# Patient Record
Sex: Male | Born: 1965 | Race: White | Hispanic: No | Marital: Married | Smoking: Never smoker
Health system: Southern US, Community
[De-identification: ages and names within clinical notes are randomized; demographics above are authoritative.]

## PROBLEM LIST (undated history)

## (undated) DIAGNOSIS — Z973 Presence of spectacles and contact lenses: Secondary | ICD-10-CM

## (undated) DIAGNOSIS — F329 Major depressive disorder, single episode, unspecified: Secondary | ICD-10-CM

## (undated) DIAGNOSIS — F17220 Nicotine dependence, chewing tobacco, uncomplicated: Secondary | ICD-10-CM

## (undated) DIAGNOSIS — E291 Testicular hypofunction: Secondary | ICD-10-CM

## (undated) DIAGNOSIS — I10 Essential (primary) hypertension: Secondary | ICD-10-CM

## (undated) DIAGNOSIS — G4730 Sleep apnea, unspecified: Secondary | ICD-10-CM

## (undated) DIAGNOSIS — I4819 Other persistent atrial fibrillation: Secondary | ICD-10-CM

## (undated) DIAGNOSIS — I5189 Other ill-defined heart diseases: Secondary | ICD-10-CM

## (undated) DIAGNOSIS — E782 Mixed hyperlipidemia: Secondary | ICD-10-CM

## (undated) DIAGNOSIS — M1990 Unspecified osteoarthritis, unspecified site: Secondary | ICD-10-CM

## (undated) DIAGNOSIS — K219 Gastro-esophageal reflux disease without esophagitis: Secondary | ICD-10-CM

## (undated) DIAGNOSIS — I2510 Atherosclerotic heart disease of native coronary artery without angina pectoris: Secondary | ICD-10-CM

## (undated) DIAGNOSIS — G8929 Other chronic pain: Principal | ICD-10-CM

## (undated) HISTORY — DX: Essential (primary) hypertension: I10

## (undated) HISTORY — DX: Mixed hyperlipidemia: E78.2

## (undated) HISTORY — DX: Nicotine dependence, chewing tobacco, uncomplicated: F17.220

## (undated) HISTORY — PX: WISDOM TOOTH EXTRACTION: SHX21

## (undated) HISTORY — DX: Other persistent atrial fibrillation: I48.19

## (undated) HISTORY — PX: TONSILLECTOMY: SUR1361

## (undated) HISTORY — DX: Atherosclerotic heart disease of native coronary artery without angina pectoris: I25.10

## (undated) HISTORY — DX: Unspecified osteoarthritis, unspecified site: M19.90

## (undated) HISTORY — DX: Other ill-defined heart diseases: I51.89

## (undated) HISTORY — DX: Other chronic pain: G89.29

## (undated) HISTORY — DX: Major depressive disorder, single episode, unspecified: F32.9

## (undated) HISTORY — DX: Testicular hypofunction: E29.1

## (undated) HISTORY — DX: Morbid (severe) obesity due to excess calories: E66.01

---

## 2002-06-09 ENCOUNTER — Emergency Department (HOSPITAL_COMMUNITY): Admission: EM | Admit: 2002-06-09 | Discharge: 2002-06-10 | Payer: Self-pay | Admitting: Emergency Medicine

## 2003-04-11 ENCOUNTER — Ambulatory Visit (HOSPITAL_BASED_OUTPATIENT_CLINIC_OR_DEPARTMENT_OTHER): Admission: RE | Admit: 2003-04-11 | Discharge: 2003-04-11 | Payer: Self-pay | Admitting: Family Medicine

## 2004-01-20 ENCOUNTER — Ambulatory Visit: Payer: Self-pay | Admitting: Family Medicine

## 2004-01-22 ENCOUNTER — Ambulatory Visit: Payer: Self-pay | Admitting: Family Medicine

## 2004-02-19 ENCOUNTER — Ambulatory Visit: Payer: Self-pay | Admitting: Family Medicine

## 2004-02-26 ENCOUNTER — Ambulatory Visit: Payer: Self-pay | Admitting: Family Medicine

## 2005-05-03 ENCOUNTER — Ambulatory Visit: Payer: Self-pay | Admitting: Family Medicine

## 2005-06-02 ENCOUNTER — Ambulatory Visit: Payer: Self-pay | Admitting: Family Medicine

## 2005-08-02 ENCOUNTER — Ambulatory Visit: Payer: Self-pay | Admitting: Family Medicine

## 2006-02-01 ENCOUNTER — Ambulatory Visit: Payer: Self-pay | Admitting: Family Medicine

## 2006-02-28 ENCOUNTER — Ambulatory Visit: Payer: Self-pay | Admitting: Family Medicine

## 2006-03-08 ENCOUNTER — Ambulatory Visit: Payer: Self-pay | Admitting: Family Medicine

## 2006-03-20 ENCOUNTER — Ambulatory Visit: Payer: Self-pay | Admitting: Unknown Physician Specialty

## 2006-03-21 ENCOUNTER — Ambulatory Visit: Payer: Self-pay | Admitting: Cardiology

## 2006-03-21 ENCOUNTER — Ambulatory Visit: Payer: Self-pay

## 2006-04-04 ENCOUNTER — Ambulatory Visit: Payer: Self-pay | Admitting: Family Medicine

## 2006-09-22 ENCOUNTER — Ambulatory Visit: Payer: Self-pay | Admitting: Family Medicine

## 2006-09-22 DIAGNOSIS — E8881 Metabolic syndrome: Secondary | ICD-10-CM | POA: Insufficient documentation

## 2006-09-22 DIAGNOSIS — I10 Essential (primary) hypertension: Secondary | ICD-10-CM | POA: Insufficient documentation

## 2006-09-22 DIAGNOSIS — E1159 Type 2 diabetes mellitus with other circulatory complications: Secondary | ICD-10-CM | POA: Insufficient documentation

## 2006-09-22 DIAGNOSIS — E782 Mixed hyperlipidemia: Secondary | ICD-10-CM | POA: Insufficient documentation

## 2006-09-22 DIAGNOSIS — M069 Rheumatoid arthritis, unspecified: Secondary | ICD-10-CM | POA: Insufficient documentation

## 2006-09-22 HISTORY — DX: Morbid (severe) obesity due to excess calories: E66.01

## 2006-09-22 HISTORY — DX: Essential (primary) hypertension: I10

## 2006-09-23 ENCOUNTER — Ambulatory Visit: Payer: Self-pay | Admitting: Internal Medicine

## 2006-09-27 ENCOUNTER — Encounter (INDEPENDENT_AMBULATORY_CARE_PROVIDER_SITE_OTHER): Payer: Self-pay | Admitting: Internal Medicine

## 2006-09-28 LAB — CONVERTED CEMR LAB
BUN: 11 mg/dL (ref 6–23)
CO2: 28 meq/L (ref 19–32)
Calcium: 9.1 mg/dL (ref 8.4–10.5)
Chloride: 103 meq/L (ref 96–112)
Cholesterol: 207 mg/dL (ref 0–200)
Creatinine, Ser: 1 mg/dL (ref 0.4–1.5)
Direct LDL: 138.7 mg/dL
GFR calc Af Amer: 105 mL/min
GFR calc non Af Amer: 87 mL/min
Glucose, Bld: 93 mg/dL (ref 70–99)
HDL: 29.5 mg/dL — ABNORMAL LOW (ref >39.0)
Potassium: 3.8 meq/L (ref 3.5–5.1)
Sodium: 138 meq/L (ref 135–145)
Total CHOL/HDL Ratio: 7
Triglycerides: 130 mg/dL (ref 0–149)
VLDL: 26 mg/dL (ref 0–40)

## 2006-11-29 DIAGNOSIS — G4730 Sleep apnea, unspecified: Secondary | ICD-10-CM | POA: Insufficient documentation

## 2006-11-29 DIAGNOSIS — G4733 Obstructive sleep apnea (adult) (pediatric): Secondary | ICD-10-CM | POA: Insufficient documentation

## 2006-12-05 ENCOUNTER — Ambulatory Visit: Payer: Self-pay | Admitting: Family Medicine

## 2006-12-06 LAB — CONVERTED CEMR LAB
ALT: 38 units/L (ref 0–53)
AST: 29 units/L (ref 0–37)
Cholesterol: 138 mg/dL (ref 0–200)
HDL: 32.7 mg/dL — ABNORMAL LOW (ref >39.0)
LDL Cholesterol: 74 mg/dL (ref 0–99)
Total CHOL/HDL Ratio: 4.2
Triglycerides: 157 mg/dL — ABNORMAL HIGH (ref 0–149)
VLDL: 31 mg/dL (ref 0–40)

## 2007-01-06 ENCOUNTER — Ambulatory Visit: Payer: Self-pay | Admitting: Family Medicine

## 2007-02-28 ENCOUNTER — Ambulatory Visit: Payer: Self-pay | Admitting: Family Medicine

## 2007-02-28 DIAGNOSIS — M719 Bursopathy, unspecified: Secondary | ICD-10-CM

## 2007-02-28 DIAGNOSIS — M67919 Unspecified disorder of synovium and tendon, unspecified shoulder: Secondary | ICD-10-CM | POA: Insufficient documentation

## 2007-03-16 ENCOUNTER — Ambulatory Visit: Payer: Self-pay | Admitting: Family Medicine

## 2007-03-16 DIAGNOSIS — F3289 Other specified depressive episodes: Secondary | ICD-10-CM

## 2007-03-16 DIAGNOSIS — F329 Major depressive disorder, single episode, unspecified: Secondary | ICD-10-CM

## 2007-03-16 DIAGNOSIS — F324 Major depressive disorder, single episode, in partial remission: Secondary | ICD-10-CM | POA: Insufficient documentation

## 2007-03-16 HISTORY — DX: Other specified depressive episodes: F32.89

## 2007-03-16 HISTORY — DX: Major depressive disorder, single episode, unspecified: F32.9

## 2007-03-20 ENCOUNTER — Telehealth: Payer: Self-pay | Admitting: Family Medicine

## 2007-03-20 ENCOUNTER — Ambulatory Visit: Payer: Self-pay | Admitting: Family Medicine

## 2007-03-20 DIAGNOSIS — J069 Acute upper respiratory infection, unspecified: Secondary | ICD-10-CM | POA: Insufficient documentation

## 2007-03-23 ENCOUNTER — Encounter: Payer: Self-pay | Admitting: Family Medicine

## 2007-04-25 ENCOUNTER — Ambulatory Visit: Payer: Self-pay | Admitting: Family Medicine

## 2007-04-25 DIAGNOSIS — J310 Chronic rhinitis: Secondary | ICD-10-CM | POA: Insufficient documentation

## 2007-04-25 DIAGNOSIS — M7020 Olecranon bursitis, unspecified elbow: Secondary | ICD-10-CM | POA: Insufficient documentation

## 2007-04-25 DIAGNOSIS — F528 Other sexual dysfunction not due to a substance or known physiological condition: Secondary | ICD-10-CM | POA: Insufficient documentation

## 2007-06-08 ENCOUNTER — Encounter (INDEPENDENT_AMBULATORY_CARE_PROVIDER_SITE_OTHER): Payer: Self-pay | Admitting: *Deleted

## 2007-07-27 ENCOUNTER — Telehealth (INDEPENDENT_AMBULATORY_CARE_PROVIDER_SITE_OTHER): Payer: Self-pay | Admitting: Internal Medicine

## 2007-08-22 ENCOUNTER — Ambulatory Visit: Payer: Self-pay | Admitting: Family Medicine

## 2007-08-22 DIAGNOSIS — N63 Unspecified lump in unspecified breast: Secondary | ICD-10-CM | POA: Insufficient documentation

## 2007-08-22 DIAGNOSIS — K644 Residual hemorrhoidal skin tags: Secondary | ICD-10-CM | POA: Insufficient documentation

## 2007-08-28 ENCOUNTER — Encounter: Admission: RE | Admit: 2007-08-28 | Discharge: 2007-08-28 | Payer: Self-pay | Admitting: Family Medicine

## 2007-08-28 ENCOUNTER — Telehealth (INDEPENDENT_AMBULATORY_CARE_PROVIDER_SITE_OTHER): Payer: Self-pay | Admitting: Internal Medicine

## 2007-12-13 ENCOUNTER — Ambulatory Visit: Payer: Self-pay | Admitting: Family Medicine

## 2008-01-12 ENCOUNTER — Encounter (INDEPENDENT_AMBULATORY_CARE_PROVIDER_SITE_OTHER): Payer: Self-pay | Admitting: Internal Medicine

## 2008-01-12 ENCOUNTER — Ambulatory Visit: Payer: Self-pay | Admitting: Family Medicine

## 2008-01-12 DIAGNOSIS — R0602 Shortness of breath: Secondary | ICD-10-CM | POA: Insufficient documentation

## 2008-01-12 DIAGNOSIS — R079 Chest pain, unspecified: Secondary | ICD-10-CM | POA: Insufficient documentation

## 2008-01-24 ENCOUNTER — Ambulatory Visit: Payer: Self-pay | Admitting: Pulmonary Disease

## 2008-02-06 ENCOUNTER — Ambulatory Visit: Payer: Self-pay | Admitting: Internal Medicine

## 2008-02-07 ENCOUNTER — Ambulatory Visit: Payer: Self-pay | Admitting: Pulmonary Disease

## 2008-02-14 ENCOUNTER — Telehealth (INDEPENDENT_AMBULATORY_CARE_PROVIDER_SITE_OTHER): Payer: Self-pay | Admitting: Internal Medicine

## 2009-04-10 ENCOUNTER — Encounter: Admission: RE | Admit: 2009-04-10 | Discharge: 2009-04-10 | Payer: Self-pay | Admitting: Internal Medicine

## 2009-04-28 ENCOUNTER — Ambulatory Visit: Payer: Self-pay | Admitting: Gastroenterology

## 2009-05-15 ENCOUNTER — Ambulatory Visit: Payer: Self-pay | Admitting: Gastroenterology

## 2009-10-06 ENCOUNTER — Encounter (INDEPENDENT_AMBULATORY_CARE_PROVIDER_SITE_OTHER): Payer: Self-pay | Admitting: *Deleted

## 2010-03-27 IMAGING — CT CT ANGIO CHEST
1 of 2 series · 19 of 32 positions shown · IV contrast (Omnipaque 300)
Comparison: None

CLINICAL DATA: Shortness of breath and chest pain

CT ANGIOGRAPHY CHEST
TECHNIQUE: Multidetector CT imaging of the chest using the
standard protocol during bolus administration of intravenous
contrast. Multiplanar reconstructed images including MIPs were
obtained and reviewed to evaluate the vascular anatomy.
Contrast: 80 ml [id number]

[Series 5: pe 1.0 b20f st · axial · 0.78mm/px · z∈[-362,-101]mm · 19 of 287 slices shown]
[im 13/287  lung]
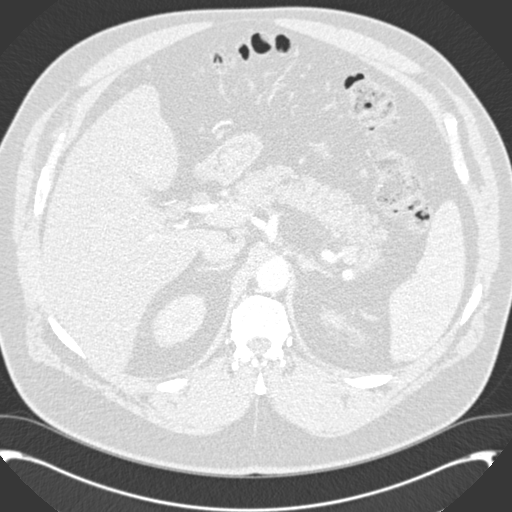
[im 25/287  soft-tissue]
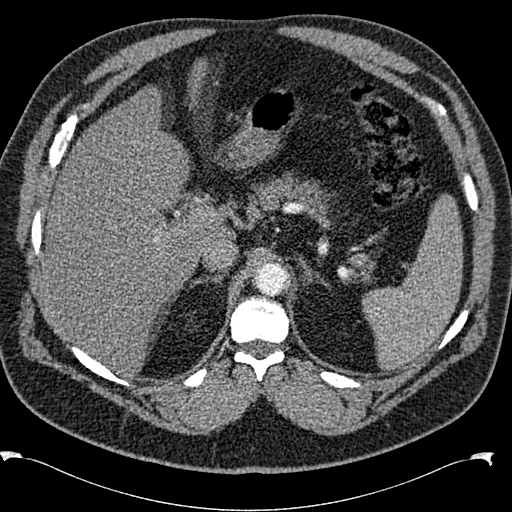
[im 38/287  lung]
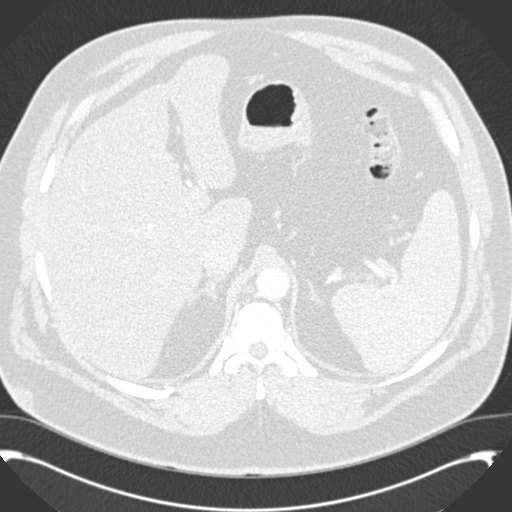
[im 63/287  soft-tissue]
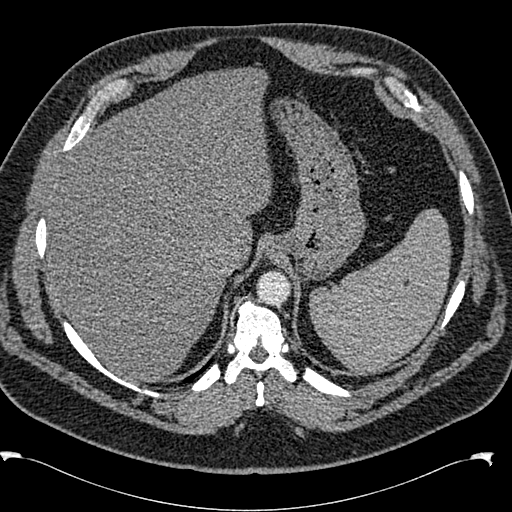
[im 75/287  lung]
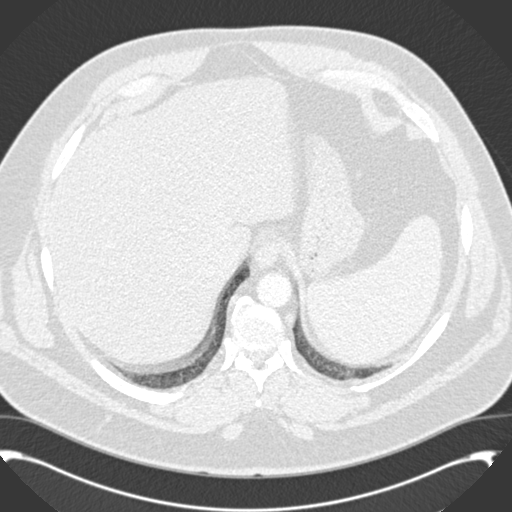
[im 88/287  soft-tissue]
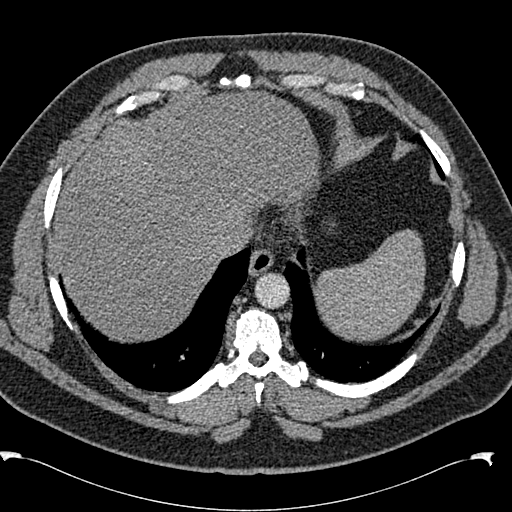
[im 100/287  lung]
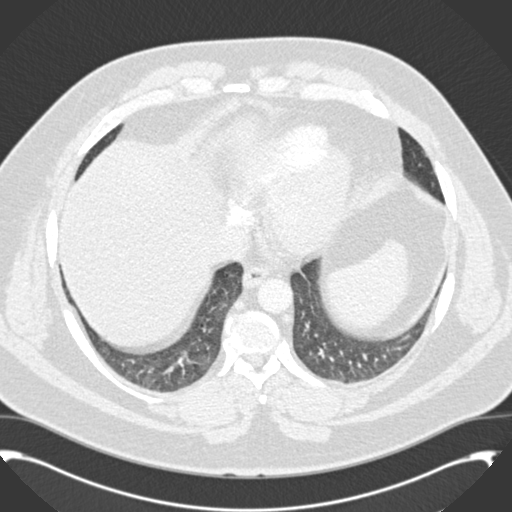
[im 112/287  soft-tissue]
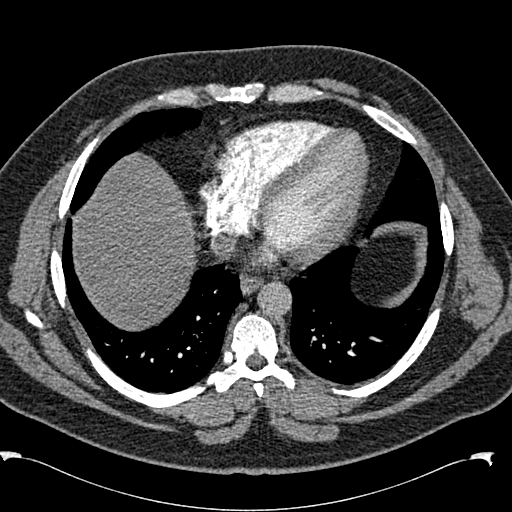
[im 125/287  lung]
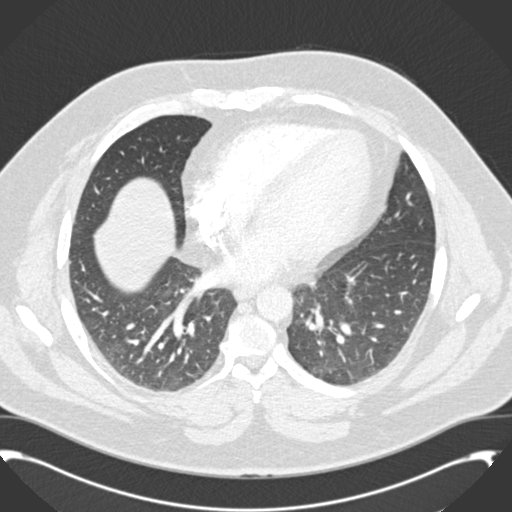
[im 150/287  soft-tissue]
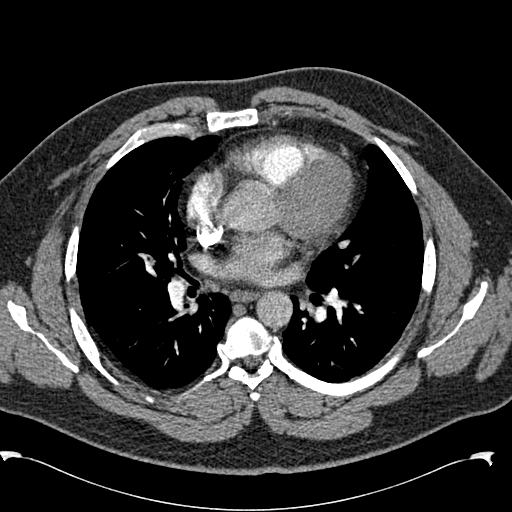
[im 162/287  lung]
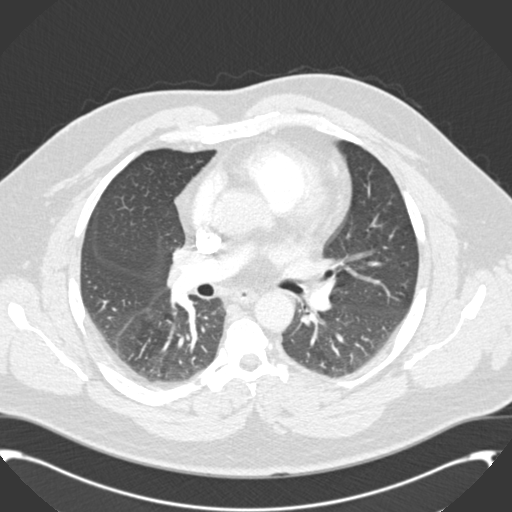
[im 175/287  soft-tissue]
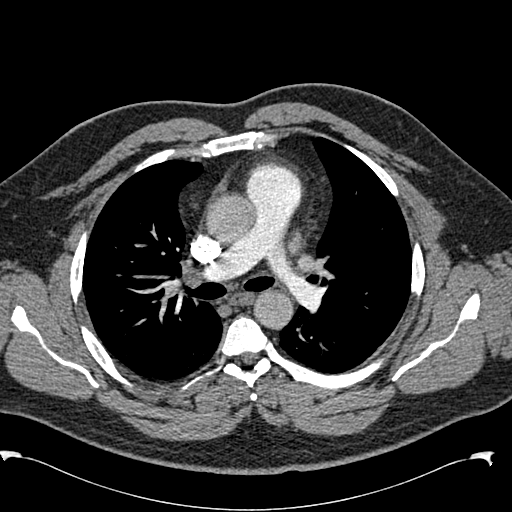
[im 187/287  lung]
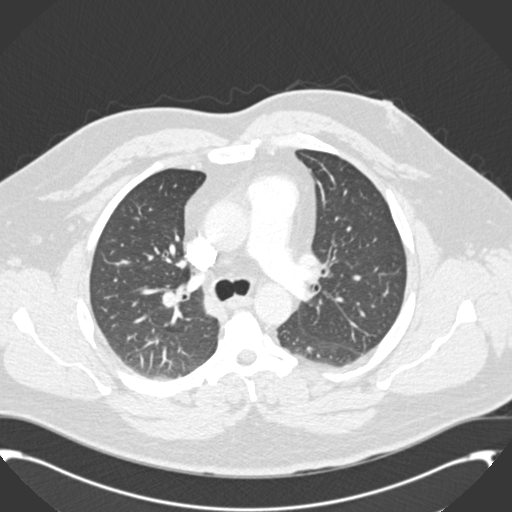
[im 199/287  soft-tissue]
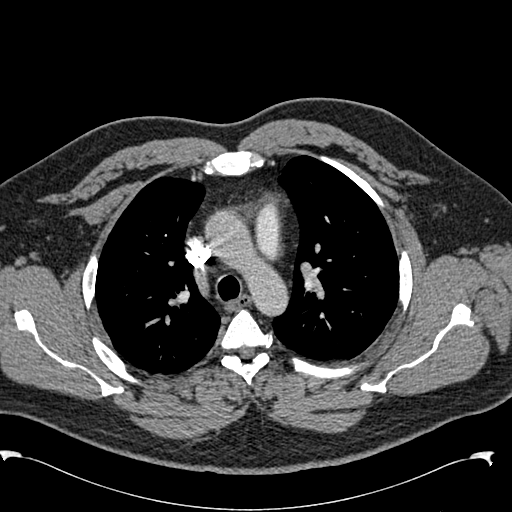
[im 212/287  lung]
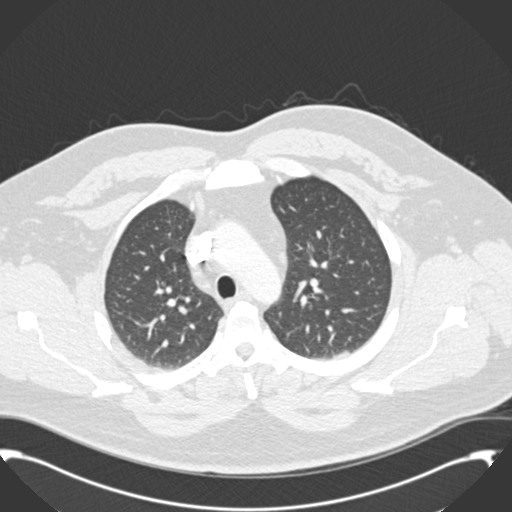
[im 224/287  soft-tissue]
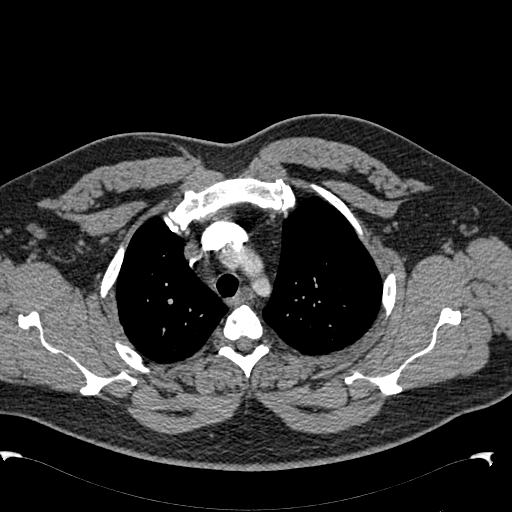
[im 249/287  lung]
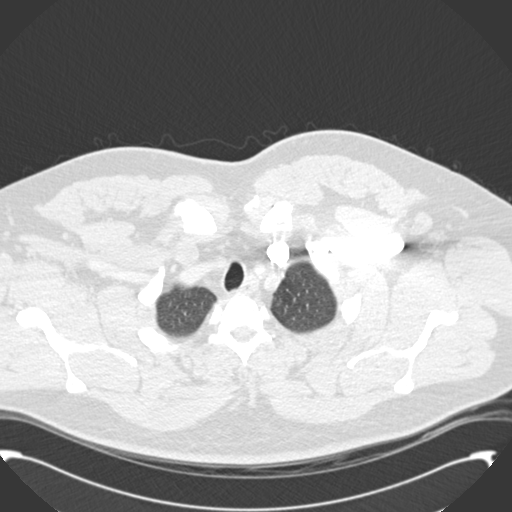
[im 262/287  soft-tissue]
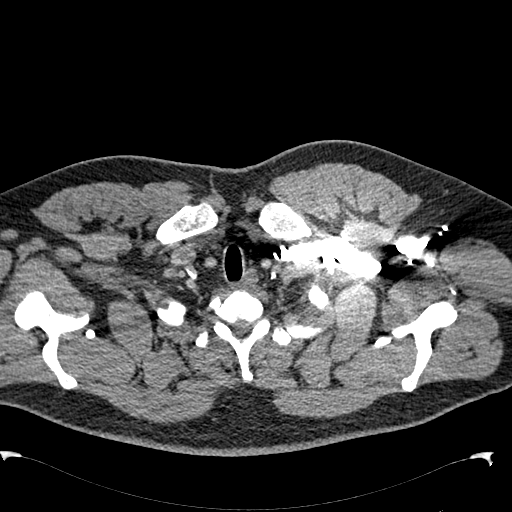
[im 274/287  lung]
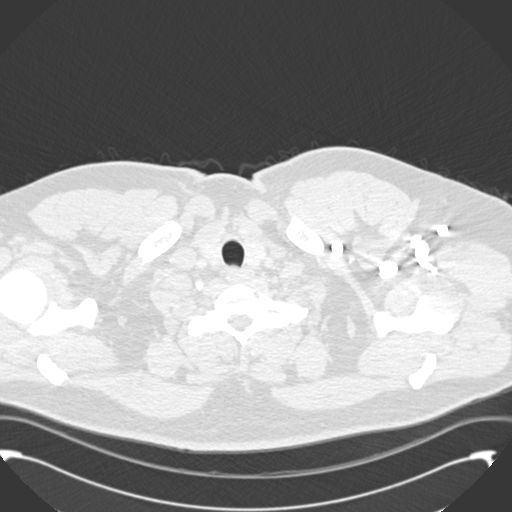

[19 of 32 positions shown; findings below may reference images not displayed]

FINDINGS: The chest wall is unremarkable.  No supraclavicular or
axillary adenopathy.  The bony thorax is intact.  Mild degenerative
changes are noted in the thoracic spine.

The heart is within normal limits in size.  No pericardial
effusion.  The thoracic aorta is normal in caliber.  No dissection.

The pulmonary arterial tree is not well opacified but no definite
filling defects are seen to suggest pulmonary emboli.  The upper
lobe vessels are not well evaluated and there is breathing motion
artifact particularly in the lower lung zones which decreases
sensitivity for small pulmonary emboli.

The esophagus is grossly normal.  No mediastinal or hilar
adenopathy.

Examination of the lung parenchyma demonstrates breathing motion
artifact.  No definite masses or nodules.  No acute pulmonary
findings.  No pleural effusions.  Mild dependent edema/atelectasis.
The upper abdomen is unremarkable.
IMPRESSION: 1.  Limited examination due to a suboptimal opacification of the
pulmonary arteries and breathing motion artifact.  However, no
large central pulmonary emboli are seen.
2.  Normal CT appearance of the thoracic aorta.
3.  No mediastinal or hilar adenopathy.
4.  No acute pulmonary findings, mass lesions or pulmonary nodules.

## 2010-03-31 NOTE — Assessment & Plan Note (Signed)
Summary: rov for dypnea   Referred by:  Everrett Coombe PCP:  Everrett Coombe  Chief Complaint:  Pt returns for followup on PFT's and ct chest.  He denies any complaints today.Marland Kitchen  History of Present Illness: the pt comes in today for discussion of his recent ct chest and pfts for w/u of his doe and atypical chest/shoulder pain.  His ct chest revealed no mediastinal, parenchymal, or chest wall abnormality.  There was also no PE noted.  His pfts were essentially normal.  I have gone over the studies in great detail with the patient and his wife, and answered all of their questions.     Current Allergies: PENICILLIN G POTASSIUM (PENICILLIN G POTASSIUM)      Vital Signs:  Patient Profile:   45 Years Old Male Weight:      314 pounds O2 Sat:      91 % O2 treatment:    Room Air Temp:     98.1 degrees F oral Pulse rate:   102 / minute BP sitting:   130 / 78  (left arm)  Vitals Entered By: Vernie Murders (February 07, 2008 4:00 PM)                 Physical Exam  General:     obese male in nad      Impression & Recommendations:  Problem # 1:  SHORTNESS OF BREATH (ICD-786.05) no obvious pulmonary etiology found.  Suspect is secondary to obesity and deconditioning, but would also consider occult coronary artery disease or diastolic dysfunction.  Problem # 2:  CHEST PAIN (ICD-786.50) suspect is msk in origin.  No pulmonary or anatomic etiology seen on ct chest.  May need ortho consult since involves shoulder girdle.   Patient Instructions: 1)  work on weight loss and some type of conditioning program 2)  would be happy to see again if symptoms change or worsen.   ]

## 2010-03-31 NOTE — Progress Notes (Signed)
Summary: Help for hemorrhoid!  Phone Note Call from Patient Call back at [phone]    Caller: Spouse-Kathy Summary of Call: Pt seen a week ago and was given suppositories for hemorrhoids  . Wife states no matter how hard they try they cannot get them to stay in. She needs advice on what to do. Also because she cannot get them to work they went out and bought Prep H. On the box it stated not to take if you take RX for BP or Depression. He takes both. Please Advise!  Initial call taken by: Mickle Asper,  August 28, 2007 3:23 PM  Follow-up for Phone Call        needs to lie on bed and with gloved finger push gently past sphincter, lie on bed without pushing 57min--will melt and felling of needeing to go will go away.  Billie-Lynn Tyler Deis FNP  August 28, 2007 4:52 PM   Additional Follow-up for Phone Call Additional follow up Details #1::        PC TO PT'S SPOUSE, KATHY, GIVEN INSTRUCTIONS ON HOW TO INSERT SUPPOSITORIES. WILL LET us KNOW IF THIS HELPS OR NOT. Additional Follow-up by: Cooper Render,  August 28, 2007 4:57 PM

## 2010-03-31 NOTE — Letter (Signed)
Summary: Nadara Eaton letter  [place] at [facility]  [address] [place], [place] [address]   Phone: [phone]  Fax: [phone]       10/06/2009 MRN: [id number]  NIKOLA MARONE [address] [place], [place]  [address]  Dear Mr. [facility] [place] and [facility] announce the retirement of Arta Silence, M.D., from full-time practice at the [facility] office effective August 28, 2009 and his plans of returning part-time.  It is important to Dr. Hetty Ely and to our practice that you understand that [facility] has seven physicians in our office for your health care needs.  We will continue to offer the same exceptional care that you have today.    Dr. Hetty Ely has spoken to many of you about his plans for retirement and returning part-time in the fall.   We will continue to work with you through the transition to schedule appointments for you in the office and meet the high standards that [facility] is committed to.   Again, it is with great pleasure that we share the news that Dr. Hetty Ely will return to [facility] in October of 2011 with a reduced schedule.    If you have any questions, or would like to request an appointment with one of our physicians, please call us at [phone] and press the option for Scheduling an appointment.  We take pleasure in providing you with excellent patient care and look forward to seeing you at your next office visit.  Our [facility] Physicians are:  Tillman Abide, M.D. Laurita Quint, M.D. Roxy Manns, M.D. Kerby Nora, M.D. Hannah Beat, M.D. Ruthe Mannan, M.D. We proudly welcomed Raechel Ache, M.D. and Eustaquio Boyden, M.D. to the practice in July/August 2011.  Sincerely,  [facility]

## 2010-03-31 NOTE — Progress Notes (Signed)
Summary: Rx Flonase  Phone Note Call from Patient Call back at [phone]   Caller: Spouse Call For: Anthony Coombe, FNP Summary of Call: Calling to find out if you will refill his Flonase, which he has not had for a while he has been using OTC nasal sprays..  Pt is outside alot working, plowing with alot of dust.   Pt has head congestion and some stuffiness and the outdoor work makes this worse.   Pharmacy - [facility] Initial call taken by: Sydell Axon,  Jul 27, 2007 9:37 AM  Follow-up for Phone Call         Rx attatched  Anthony Griffes FNP  Jul 27, 2007 1:58 PM   Additional Follow-up for Phone Call Additional follow up Details #1::        med phoned to pharmacy.  pt's wife informed. Additional Follow-up by: Cooper Render,  Jul 27, 2007 2:25 PM    New/Updated Medications: FLONASE 50 MCG/ACT  SUSP (FLUTICASONE PROPIONATE) 2 sprays each nostril daily   Prescriptions: FLONASE 50 MCG/ACT  SUSP (FLUTICASONE PROPIONATE) 2 sprays each nostril daily  #1 x 12   Entered and Authorized by:   Anthony Griffes FNP   Signed by:   Anthony Griffes FNP on 07/27/2007   Method used:   Electronically sent to ...       [id number]       [address]       [place], [place]  [address]       Ph: [phone] or [phone]       Fax: [phone]   RxID:   [id number]

## 2010-03-31 NOTE — Letter (Signed)
Summary: [place] No Show Letter  [place] at [facility]  [address] [place], [place] [address]   Phone: [phone]  Fax: [phone]    06/08/2007   Dear Mr. Zamarripa,   Our records indicate that you missed your scheduled appointment with Billie Bean,FNP on 06/08/07.  Please contact this office to reschedule your appointment as soon as possible.  It is important that you keep your scheduled appointments with your physician, so we can provide you the best care possible.  Please be advised that there may be a charge for "no show" appointments.    Sincerely,    Billie Bean,FNP/K. [facility]reitta [facility] at [facility]

## 2010-03-31 NOTE — Assessment & Plan Note (Signed)
Summary: discuss having more blood work done/bir  Medications Added CYMBALTA 60 MG CPEP (DULOXETINE HCL) Take 1 capsule by mouth once a day NORVASC 10 MG  TABS (AMLODIPINE BESYLATE) Take 1 tablet by mouth once a day NEXIUM 40 MG  CPDR (ESOMEPRAZOLE MAGNESIUM) Take 1 tablet by mouth once a day NASACORT AQ 55 MCG/ACT  AERS (TRIAMCINOLONE ACETONIDE(NASAL)) as needed      Allergies Added: PENICILLIN G POTASSIUM (PENICILLIN G POTASSIUM)  Vital Signs:  Patient Profile:   45 Years Old Male Weight:      296 pounds Temp:     98.6 degrees F oral Pulse rate:   87 / minute BP sitting:   178 / 117  (right arm) Cuff size:   large  Vitals Entered By: Cooper Render (September 22, 2006 8:35 AM)               Chief Complaint:  discuss having more labwork.  History of Present Illness: Here due to friend having MI,  ~ his age.  Now wants labs.  Chews tobacco, no exercise.  Lipids last yr:  HDL--30.9, LDL146.1, glucose 102.    BP at home has been good.  Taking Norvasc 10 once daily.   Current Allergies: PENICILLIN G POTASSIUM (PENICILLIN G POTASSIUM)     Review of Systems      See HPI   Physical Exam  General:     waiste measures 52 1/2"alert, well-developed, well-nourished, well-hydrated, and overweight-appearing.   Head:     normocephalic.   Eyes:     pupils equal and pupils round.   Lungs:     normal respiratory effort, no intercostal retractions, no crackles, and no wheezes.   Heart:     normal rate, regular rhythm, no murmur, no gallop, and no rub.   Extremities:     trace to 1+ pitting to shins bilat( 8:45am) Neurologic:     alert & oriented X3, sensation intact to light touch, and gait normal.   Skin:     turgor normal and color normal.   Psych:     memory intact for recent and remote and normally interactive.      Impression & Recommendations:  Problem # 1:  METABOLIC SYNDROME X (ICD-277.7) schedule in for labs--meds if up encouraged to reduce fat and simple CHO  intake; to exercise regularily handouts on lipids and low fat diet given  Medications Added to Medication List This Visit: 1)  Cymbalta 60 Mg Cpep (Duloxetine hcl) .... Take 1 capsule by mouth once a day 2)  Norvasc 10 Mg Tabs (Amlodipine besylate) .... Take 1 tablet by mouth once a day 3)  Nexium 40 Mg Cpdr (Esomeprazole magnesium) .... Take 1 tablet by mouth once a day 4)  Nasacort Aq 55 Mcg/act Aers (Triamcinolone acetonide(nasal)) .... As needed   Patient Instructions: 1)  Schedule fasting labs:  lipids---272.2 2)                                       bmet--401.1

## 2010-03-31 NOTE — Assessment & Plan Note (Signed)
Summary: PAIN IN CHEST / LFW   Vital Signs:  Patient Profile:   45 Years Old Male Weight:      313.12 pounds O2 Sat:      95 % Temp:     98.8 degrees F oral Pulse rate:   72 / minute BP sitting:   150 / 92  (left arm)  Pt. in pain?   no  Vitals Entered By: Jeremy Johann CMA (January 12, 2008 10:57 AM)                  Chief Complaint:  reoccuring chest pain.  History of Present Illness: Here due to continued L sided chest pain for >42yr, there almost all the time, some days worse than other --takes aleve 2 every morning--helps some, diclofenac did not help --worse the tireder he gets, wakes with pain  ~4am--gets up --walking does not affect, sitting makes worse. using arms does not affect,  --has some shortness of breath and pain worse at that time. --saw Dr Daleen Squibb 1/08--stress test-- neg --has not stopped chewing tobacco  Here with wife    Current Allergies (reviewed today): PENICILLIN G POTASSIUM (PENICILLIN G POTASSIUM)    Risk Factors:  Tobacco use:  current    Smokeless:  Yes -- chews per day Passive smoke exposure:  no Drug use:  no HIV high-risk behavior:  no Exercise:  no   Review of Systems  CV      See HPI      Complains of chest pain or discomfort, palpitations, and swelling of feet.      Denies swelling of hands.      palp with anxiety only  Resp      Complains of shortness of breath.      Denies wheezing.  GI      Denies nausea and vomiting.  MS      Complains of joint pain.      Denies loss of strength and muscle aches.  Neuro      Denies difficulty with concentration, disturbances in coordination, falling down, numbness, tremors, and weakness.  Psych      Complains of depression.      under good control on Cymbalta   Physical Exam  General:     alert, well-developed, well-nourished, well-hydrated, and overweight-appearing.  anxious Neck:     no thyromegaly, no JVD, and no carotid bruits.   Chest Wall:     no chest  wall tenderness with palp over anterior wall Lungs:     normal respiratory effort, no intercostal retractions, no accessory muscle use, and normal breath sounds.   Heart:     normal rate, regular rhythm, and no murmur.   Msk:     no chest pain with raising of either arm above head some limitation of ROM both shoulders Neurologic:     alert & oriented X3, strength normal in all extremities, sensation intact to light touch, and gait normal.   Skin:     turgor normal, color normal, and no rashes.   Psych:     normally interactive, flat affect, subdued, and slightly anxious.      Impression & Recommendations:  Problem # 1:  CHEST PAIN (ICD-786.50) Assessment: Unchanged with neg stress test 1/08 and nl EKG today, doubt is cardiac in origin Orders: 12 Lead EKG (12 Lead EKG)   Problem # 2:  SHORTNESS OF BREATH (ICD-786.05) Assessment: New office spirometry is "undetermined" has known sleep apnea--not sure mask fits correctly will  refer to Pulm for eval Orders: Spirometry w/Graph (94010) CXR- 2view (CXR) Pulmonary Referral (Pulmonary)   Problem # 3:  DEPRESSION (ICD-311) Assessment: Unchanged stable on Cymbalta--continue His updated medication list for this problem includes:    Cymbalta 60 Mg Cpep (Duloxetine hcl) ..... One tab by mouth once daily   Problem # 4:  SLEEP APNEA (ICD-780.57) Assessment: Unchanged not sure mask working per patient, has not called the DME company re checking--suggested that wife call to have them eval fit and possibly other delivery system  Problem # 5:  HYPERTENSION (ICD-401.9) Assessment: Unchanged not well controlled --will address at next visit--after eval with Pulm His updated medication list for this problem includes:    Norvasc 10 Mg Tabs (Amlodipine besylate) .Marland Kitchen... Take 1 tablet by mouth once a day    Hydrochlorothiazide 12.5 Mg Tabs (Hydrochlorothiazide) .Marland Kitchen... Take 1 tablet by mouth once a day  BP today: 150/92 Prior BP: 152/98  (12/13/2007)  Labs Reviewed: Creat: 1.0 (09/23/2006) Chol: 138 (12/05/2006)   HDL: 32.7 (12/05/2006)   LDL: 74 (12/05/2006)   TG: 157 (12/05/2006)   Complete Medication List: 1)  Norvasc 10 Mg Tabs (Amlodipine besylate) .... Take 1 tablet by mouth once a day 2)  Nexium 40 Mg Cpdr (Esomeprazole magnesium) .... Take 1 tablet by mouth once a day 3)  Zocor 40 Mg Tabs (Simvastatin) .... Take 1 tablet by mouth once a day 4)  Cymbalta 60 Mg Cpep (Duloxetine hcl) .... One tab by mouth once daily 5)  Hydrochlorothiazide 12.5 Mg Tabs (Hydrochlorothiazide) .... Take 1 tablet by mouth once a day   Patient Instructions: 1)  refer to Pulmonary   ]

## 2010-03-31 NOTE — Assessment & Plan Note (Signed)
Summary: FLU  Nurse Visit    Prior Medications: CYMBALTA 60 MG CPEP (DULOXETINE HCL) Take 1 capsule by mouth once a day NORVASC 10 MG  TABS (AMLODIPINE BESYLATE) Take 1 tablet by mouth once a day NEXIUM 40 MG  CPDR (ESOMEPRAZOLE MAGNESIUM) Take 1 tablet by mouth once a day NASACORT AQ 55 MCG/ACT  AERS (TRIAMCINOLONE ACETONIDE(NASAL)) as needed ZOCOR 40 MG  TABS (SIMVASTATIN) 1qd for cholesterol Current Allergies: PENICILLIN G POTASSIUM (PENICILLIN G POTASSIUM)    Orders Added: 1)  Flu Vaccine 11yrs + [90658] 2)  Admin 1st Vaccine [email or web address]    ]  Influenza Vaccine    Vaccine Type: Fluvax 3+    Site: right deltoid    Mfr: Sanofi Pasteur    Dose: 0.5 ml    Route: IM    Given by: Providence Crosby    Exp. Date: 08/29/2007    Lot #: [id number]    VIS given: 08/28/04 version given January 06, 2007.  Flu Vaccine Consent Questions    Do you have a history of severe allergic reactions to this vaccine? no    Any prior history of allergic reactions to egg and/or gelatin? no    Do you have a sensitivity to the preservative Thimersol? no    Do you have a past history of Guillan-Barre Syndrome? no    Do you currently have an acute febrile illness? no    Have you ever had a severe reaction to latex? no    Vaccine information given and explained to patient? yes

## 2010-03-31 NOTE — Assessment & Plan Note (Signed)
Summary: chest congestion,cough/rbh   Vital Signs:  Patient Profile:   45 Years Old Male Weight:      317 pounds Temp:     99.0 degrees F oral Pulse rate:   95 / minute BP sitting:   152 / 98  (left arm) Cuff size:   large  Vitals Entered By: Cooper Render (December 13, 2007 2:24 PM)                 Chief Complaint:  chest congestion and non productive cough.  History of Present Illness: Here for cough--non-productive--onset x 1wk ago, no fever or chills, nasal congestion and tightness in sinuses, no ST, some wheezing.   --his children had "flu" last wk --taking Vick's Dayquil--not sure he is aware of possibility of increasse in BP with decongestants--reminded him --thinks Nasacort not working  Aware of wt gain.    Current Allergies (reviewed today): PENICILLIN G POTASSIUM (PENICILLIN G POTASSIUM)   Social History:    Reviewed history from 11/29/2006 and no changes required:       Marital Status: Married       Children: 3       Occupation: farmer    Review of Systems  ENT      Complains of postnasal drainage.      Denies nasal congestion.  CV      Complains of swelling of feet.      Denies chest pain or discomfort, palpitations, and swelling of hands.  Resp      Complains of cough, shortness of breath, and wheezing.  Psych      Complains of anxiety and depression.      stable on Cymbalta   Physical Exam  General:     alert, well-developed, well-nourished, and well-hydrated.  morbidly obese--wt gain of 12 lbs since 6/09 Heart:     normal rate, regular rhythm, and no murmur.   repeat BP 158/109    Impression & Recommendations:  Problem # 1:  URI (ICD-465.9) feel that cough is from post nasal drip and possible recent viral URI will try on Veramyst as he does not feel that nasacort working now follow up at next visit His updated medication list for this problem includes:    Diclofenac Sodium 75 Mg Tbec (Diclofenac sodium) .Marland Kitchen... Take 1 tablet by  mouth every 12 hours   Problem # 2:  HYPERTENSION (ICD-401.9) Assessment: Deteriorated due to wt gain and use of decongestants, will not change meds will see back in 1 mo and have encouraged wt off and stop all decongestants now and for ever His updated medication list for this problem includes:    Norvasc 10 Mg Tabs (Amlodipine besylate) .Marland Kitchen... Take 1 tablet by mouth once a day    Hydrochlorothiazide 12.5 Mg Tabs (Hydrochlorothiazide) .Marland Kitchen... Take 1 tablet by mouth once a day   Problem # 3:  OBESITY (ICD-278.00) Assessment: Deteriorated wt gain of 12 lbs in 4 mo--during heavy farming season discussed with him the increase in BP and wt connection---strongly encouraged 10 lbs off in next mo when seen back  Complete Medication List: 1)  Norvasc 10 Mg Tabs (Amlodipine besylate) .... Take 1 tablet by mouth once a day 2)  Nexium 40 Mg Cpdr (Esomeprazole magnesium) .... Take 1 tablet by mouth once a day 3)  Zocor 40 Mg Tabs (Simvastatin) .... Take 1 tablet by mouth once a day 4)  Diclofenac Sodium 75 Mg Tbec (Diclofenac sodium) .... Take 1 tablet by mouth every 12 hours 5)  Cymbalta 60 Mg Cpep (Duloxetine hcl) .... One tab by mouth once daily 6)  Hydrochlorothiazide 12.5 Mg Tabs (Hydrochlorothiazide) .... Take 1 tablet by mouth once a day 7)  Anusol-hc 25 Mg Supp (Hydrocortisone acetate) .Marland Kitchen.. 1 2-3x daily 8)  Veramyst 27.5 Mcg/spray Susp (Fluticasone furoate) .... 2 sprays once daily   Patient Instructions: 1)  Please schedule a follow-up appointment in 1 month.   ] Prior Medications (reviewed today): NORVASC 10 MG  TABS (AMLODIPINE BESYLATE) Take 1 tablet by mouth once a day NEXIUM 40 MG  CPDR (ESOMEPRAZOLE MAGNESIUM) Take 1 tablet by mouth once a day ZOCOR 40 MG  TABS (SIMVASTATIN) Take 1 tablet by mouth once a day DICLOFENAC SODIUM 75 MG  TBEC (DICLOFENAC SODIUM) Take 1 tablet by mouth every 12 hours CYMBALTA 60 MG  CPEP (DULOXETINE HCL) one tab by mouth once daily  HYDROCHLOROTHIAZIDE 12.5 MG  TABS (HYDROCHLOROTHIAZIDE) Take 1 tablet by mouth once a day ANUSOL-HC 25 MG  SUPP (HYDROCORTISONE ACETATE) 1 2-3x daily Current Allergies (reviewed today): PENICILLIN G POTASSIUM (PENICILLIN G POTASSIUM)

## 2010-03-31 NOTE — Assessment & Plan Note (Signed)
Summary: 3 M F/U  DLO   Vital Signs:  Patient Profile:   45 Years Old Male Weight:      307 pounds Temp:     98.3 degrees F oral Pulse rate:   82 / minute BP sitting:   131 / 97  (left arm) Cuff size:   large  Vitals Entered By: Cooper Render (March 16, 2007 9:37 AM)                 Chief Complaint:  3 mth f/u.  History of Present Illness: Here for 3 mo follow up of HBP, elevated lipids and depression.  Sometimes having problems with delayed ejaculation.  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  Down some days.  Was seen 02/28/07 for L shoulder Tolerating meds for HBP and elevated lipids at this time.  Current Allergies (reviewed today): PENICILLIN G POTASSIUM (PENICILLIN G POTASSIUM) Updated/Current Medications (including changes made in today's visit):  NORVASC 10 MG  TABS (AMLODIPINE BESYLATE) Take 1 tablet by mouth once a day NEXIUM 40 MG  CPDR (ESOMEPRAZOLE MAGNESIUM) Take 1 tablet by mouth once a day NASACORT AQ 55 MCG/ACT  AERS (TRIAMCINOLONE ACETONIDE(NASAL)) as needed ZOCOR 40 MG  TABS (SIMVASTATIN) 1qd for cholesterol DICLOFENAC SODIUM 75 MG  TBEC (DICLOFENAC SODIUM) Take 1 tablet by mouth every 12 hours CYMBALTA 60 MG  CPEP (DULOXETINE HCL) one tab by mouth qd HYDROCHLOROTHIAZIDE 12.5 MG  TABS (HYDROCHLOROTHIAZIDE) 1 qam for BP      Review of Systems      See HPI   Physical Exam  General:     alert, well-developed, well-nourished, and well-hydrated.  morbidly obese Eyes:     pupils equal and pupils round.   Neck:     normal carotid upstroke and no carotid bruits.   Lungs:     normal respiratory effort, no intercostal retractions, no accessory muscle use, and normal breath sounds.   Heart:     repeat BP  138/88normal rate, regular rhythm, no murmur, and no gallop.   Extremities:     trace pretib edema bilat Neurologic:     alert & oriented X3, sensation intact to light touch, and gait normal.   Skin:     turgor  normal, color normal, and no rashes.   Psych:     normally interactive, good eye contact, flat affect, subdued, and slightly anxious.      Impression & Recommendations:  Problem # 1:  HYPERTENSION (ICD-401.9) Assessment: Unchanged stable, will continue current meds at current doses His updated medication list for this problem includes:    Norvasc 10 Mg Tabs (Amlodipine besylate) .Marland Kitchen... Take 1 tablet by mouth once a day  BP today: 131/97 Prior BP: 148/88 (02/28/2007)  Labs Reviewed: Creat: 1.0 (09/23/2006) Chol: 138 (12/05/2006)   HDL: 32.7 (12/05/2006)   LDL: 74 (12/05/2006)   TG: 157 (12/05/2006)   Problem # 2:  HYPERLIPIDEMIA, MIXED (ICD-272.2) has been stable, get labs to monitor His updated medication list for this problem includes:    Zocor 40 Mg Tabs (Simvastatin) .Marland Kitchen... 1qd for cholesterol   Problem # 3:  ROTATOR CUFF SYNDROME, LEFT (ICD-726.10) Assessment: Deteriorated due to continued pain, will refer to Ortho for eval Orders: Orthopedic Referral (Ortho)   Problem # 4:  OBESITY (ICD-278.00) Assessment: Unchanged strongly encouraged to reduce calories and increase exercise  Problem # 5:  DEPRESSION (ICD-311) Assessment: Unchanged  His updated medication list for this problem includes:  Cymbalta 60 Mg Cpep (Duloxetine hcl) .Marland Kitchen... Take 1 capsule by mouth once a day    Wellbutrin Xl 300 Mg Tb24 (Bupropion hcl) .Marland Kitchen... 1 qam   Complete Medication List: 1)  Norvasc 10 Mg Tabs (Amlodipine besylate) .... Take 1 tablet by mouth once a day 2)  Nexium 40 Mg Cpdr (Esomeprazole magnesium) .... Take 1 tablet by mouth once a day 3)  Nasacort Aq 55 Mcg/act Aers (Triamcinolone acetonide(nasal)) .... As needed 4)  Zocor 40 Mg Tabs (Simvastatin) .Marland Kitchen.. 1qd for cholesterol 5)  Diclofenac Sodium 75 Mg Tbec (Diclofenac sodium) .... Take 1 tablet by mouth every 12 hours 6)  Cymbalta 60 Mg Cpep (Duloxetine hcl) .... One tab by mouth qd 7)  Hydrochlorothiazide 12.5 Mg Tabs  (Hydrochlorothiazide) .Marland Kitchen.. 1 qam for bp   Patient Instructions: 1)  Please schedule a follow-up appointment in 1 month. 2)  refer to Ortho    Prescriptions: WELLBUTRIN XL 300 MG  TB24 (BUPROPION HCL) 1 qam  #30 x 1   Entered and Authorized by:   Gildardo Griffes FNP   Signed by:   Gildardo Griffes FNP on 03/16/2007   Method used:   [facility] ...       [facility]  [address]  [id number]       [address]       [place], [place]  [address]       Ph: [phone] or [phone]       Fax: [phone]   RxID:   [id number]  ]

## 2010-03-31 NOTE — Consult Note (Signed)
Summary: [facility]/Dr. [facility]/Dr. Darrelyn Hillock   Imported By: Eleonore Chiquito 03/27/2007 09:07:14  _____________________________________________________________________  External Attachment:    Type:   Image     Comment:   External Document

## 2010-03-31 NOTE — Progress Notes (Signed)
Summary: wants to change to protonix  Phone Note Call from Patient Call back at Work Phone [phone]   Caller: Patient Call For: Anthony Castillo Summary of Call: Pt wants to change from nexium to protonix, says that nexium is causing gas pressure in his chest.  [facility]. Initial call taken by: Lowella Petties,  February 14, 2008 4:20 PM  Follow-up for Phone Call        new Rx attatched, telephone in , cant get to come up to send  Anthony-Lynn Tyler Deis FNP  February 14, 2008 4:58 PM   Additional Follow-up for Phone Call Additional follow up Details #1::        Pt notified as instructed.  [facility]  Rx cancelled at [facility], sent in error. Additional Follow-up by: Sydell Axon,  February 14, 2008 5:33 PM    New/Updated Medications: PROTONIX 40 MG TBEC (PANTOPRAZOLE SODIUM) 1 each morning with water, wait 30-60 min before additional food or fluid--must eat or drink at 30-60 min after taking   Prescriptions: PROTONIX 40 MG TBEC (PANTOPRAZOLE SODIUM) 1 each morning with water, wait 30-60 min before additional food or fluid--must eat or drink at 30-60 min after taking  #30 x 12   Entered and Authorized by:   Gildardo Griffes FNP   Signed by:   Gildardo Griffes FNP on 02/14/2008   Method used:   Telephoned to ...       [id number]* (retail)       [address] [place], [place]  [address]       Ph: [phone]       Fax: [phone]   RxID:   [id number] PROTONIX 40 MG TBEC (PANTOPRAZOLE SODIUM) 1 each morning with water, wait 30-60 min before additional food or fluid--must eat or drink at 30-60 min after taking  #30 x 12   Entered and Authorized by:   Gildardo Griffes FNP   Signed by:   Gildardo Griffes FNP on 02/14/2008   Method used:   Electronically to        [facility] [id number]* (retail)       [address] [place], [place]  [address]       Ph:  [phone]       Fax: [phone]   RxID:   [id number]

## 2010-03-31 NOTE — Assessment & Plan Note (Signed)
Summary: SHOULDER PAIN/CLE   Vital Signs:  Patient Profile:   45 Years Old Male Weight:      303 pounds Temp:     97.9 degrees F oral Pulse rate:   84 / minute BP sitting:   148 / 88  (left arm) Cuff size:   large  Vitals Entered By: Wandra Mannan (February 28, 2007 12:19 PM)                 Chief Complaint:  left shoulder pain.  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 Lungs are clear to auscultation, no crackles or wheezes. Heart:     Normal rate and regular rhythm. S1 and S2 normal without gallop, murmur, click, rub or other extra sounds.   Shoulder/Elbow Exam  Shoulder Exam:    Right:    Inspection:  Normal    Palpation:  Normal    Left:    Inspection:  Normal    Palpation:  Abnormal       Location:  trapezius, posterior subacromial    Stability:  stable    Swelling:  no    Erythema:  no    limited extenal rotation and abduction due to tenderness. B positive Neer's, Empty can. Neg drop arm test.    Impression & Recommendations:  Problem # 1:  ROTATOR CUFF SYNDROME, LEFT (ICD-726.10) Reviewed likely pathology. Treat conservatively with NSAIDs, Heat, strengthening exercises, limit overhead repetative activity.  Follow up if not better in 2 weeks.  Complete Medication List: 1)  Cymbalta 60 Mg Cpep (Duloxetine hcl) .... Take 1 capsule by mouth once a day 2)  Norvasc 10 Mg Tabs (Amlodipine besylate) .... Take 1 tablet by mouth once a day 3)   Nexium 40 Mg Cpdr (Esomeprazole magnesium) .... Take 1 tablet by mouth once a day 4)  Nasacort Aq 55 Mcg/act Aers (Triamcinolone acetonide(nasal)) .... As needed 5)  Zocor 40 Mg Tabs (Simvastatin) .Marland Kitchen.. 1qd for cholesterol 6)  Diclofenac Sodium 75 Mg Tbec (Diclofenac sodium) .... Take 1 tablet by mouth every 12 hours   Patient Instructions: 1)  Please schedule a follow-up appointment in 2 weeks if pain no better.    Prescriptions: DICLOFENAC SODIUM 75 MG  TBEC (DICLOFENAC SODIUM) Take 1 tablet by mouth every 12 hours  #30 x 0   Entered and Authorized by:   Kerby Nora MD   Signed by:   Kerby Nora MD on 02/28/2007   Method used:   Electronically [facility] to ...       [facility]  [address]  [phone][facility]       [address]       [place], [place]  [address]       Ph: [phone] or [phone]       Fax: [phone]   RxID:   [email or web address]  ] Current Allergies (reviewed today): PENICILLIN G POTASSIUM (PENICILLIN G POTASSIUM) Current Medications (including changes made in today's visit):  CYMBALTA 60 MG CPEP (DULOXETINE HCL) Take 1 capsule by mouth once a day NORVASC 10 MG  TABS (  AMLODIPINE BESYLATE) Take 1 tablet by mouth once a day NEXIUM 40 MG  CPDR (ESOMEPRAZOLE MAGNESIUM) Take 1 tablet by mouth once a day NASACORT AQ 55 MCG/ACT  AERS (TRIAMCINOLONE ACETONIDE(NASAL)) as needed ZOCOR 40 MG  TABS (SIMVASTATIN) 1qd for cholesterol DICLOFENAC SODIUM 75 MG  TBEC (DICLOFENAC SODIUM) Take 1 tablet by mouth every 12 hours

## 2010-03-31 NOTE — Assessment & Plan Note (Signed)
Summary: ROA   Vital Signs:  Patient Profile:   45 Years Old Male Weight:      305 pounds Temp:     98.5 degrees F oral Pulse rate:   93 / minute BP sitting:   158 / 94  (left arm) Cuff size:   large  Vitals Entered By: Cooper Render (April 25, 2007 8:32 AM)                 Chief Complaint:  f/u.  History of Present Illness: Here for follow up of change from Wellbutrin to Cymbalta on 03/20/07--did not have return of erectile problems on Cymbalta dose now.  mood stable.  Taking Norvasc on a daily basis.  no swelling pers patient.  Nasal congestion nightly, taking nothing.  Noted swelling in L elbow, no pain.    Current Allergies (reviewed today): PENICILLIN G POTASSIUM (PENICILLIN G POTASSIUM)     Review of Systems      See HPI   Physical Exam  General:     alert, well-developed, well-nourished, and well-hydrated.  NAD Eyes:     pupils equal, pupils round, and no injection.   Ears:     TMs retracted with fluid bilat Nose:     no mucosal edema and no airflow obstruction. sinuses not tender  Mouth:     no exudates and pharyngeal erythema.   Neck:     normal carotid upstroke and no carotid bruits.   Lungs:     normal respiratory effort, no intercostal retractions, no accessory muscle use, and normal breath sounds.   Heart:     rescheck of BP  158/82   normal rate, regular rhythm, no murmur, and no gallop.   Msk:     soft swelling over L elbow Extremities:     1+ pitting edema bilat mid shin at 9am Neurologic:     alert & oriented X3 and gait normal.   Skin:     turgor normal, color normal, and no rashes.   Psych:     normally interactive, good eye contact, not anxious appearing, and not depressed appearing.      Impression & Recommendations:  Problem # 1:  DEPRESSION (ICD-311) Assessment: Improved stable on Cymbalta--will continue see back in 6 mo or prn The following medications were removed from the medication list:    Wellbutrin Xl 300  Mg Tb24 (Bupropion hcl) .Marland Kitchen... 1 qam  His updated medication list for this problem includes:    Cymbalta 60 Mg Cpep (Duloxetine hcl) ..... One tab by mouth qd   Problem # 2:  HYPERTENSION (ICD-401.9) Assessment: Unchanged not at controlled level, will add HCTZ in low dose and see back in 6 wks in follow up His updated medication list for this problem includes:    Norvasc 10 Mg Tabs (Amlodipine besylate) .Marland Kitchen... Take 1 tablet by mouth once a day    Hydrochlorothiazide 12.5 Mg Tabs (Hydrochlorothiazide) .Marland Kitchen... 1 qam for bp   Problem # 3:  CHRONIC RHINITIS (ICD-472.0) Assessment: New will luse OTC claritin--loratadine on PRn basis  Problem # 4:  BURSITIS, ELBOW (ICD-726.33) Assessment: Comment Only chronic swelling with increased use  Problem # 5:  ERECTILE DYSFUNCTION (ICD-302.72) Assessment: Improved resolved  Complete Medication List: 1)  Norvasc 10 Mg Tabs (Amlodipine besylate) .... Take 1 tablet by mouth once a day 2)  Nexium 40 Mg Cpdr (Esomeprazole magnesium) .... Take 1 tablet by mouth once a day 3)  Nasacort Aq 55 Mcg/act Aers (Triamcinolone acetonide(nasal)) .... As  needed 4)  Zocor 40 Mg Tabs (Simvastatin) .Marland Kitchen.. 1qd for cholesterol 5)  Diclofenac Sodium 75 Mg Tbec (Diclofenac sodium) .... Take 1 tablet by mouth every 12 hours 6)  Cymbalta 60 Mg Cpep (Duloxetine hcl) .... One tab by mouth qd 7)  Hydrochlorothiazide 12.5 Mg Tabs (Hydrochlorothiazide) .Marland Kitchen.. 1 qam for bp   Patient Instructions: 1)  Please schedule a follow-up appointment in 6 weeks.    Prescriptions: HYDROCHLOROTHIAZIDE 12.5 MG  TABS (HYDROCHLOROTHIAZIDE) 1 qam for BP  #30 x 1   Entered and Authorized by:   Gildardo Griffes FNP   Signed by:   Gildardo Griffes FNP on 04/25/2007   Method used:   Elect[facility] to ...       [facility]  [address]  [phone][facility]       [address]       [place], [place]  [address]       Ph: [phone] or [phone]       Fax: [phone]   RxID:    [email or web address]  ] Prior Medications (reviewed today): NORVASC 10 MG  TABS (AMLODIPINE BESYLATE) Take 1 tablet by mouth once a day NEXIUM 40 MG  CPDR (ESOMEPRAZOLE MAGNESIUM) Take 1 tablet by mouth once a day NASACORT AQ 55 MCG/ACT  AERS (TRIAMCINOLONE ACETONIDE(NASAL)) as needed ZOCOR 40 MG  TABS (SIMVASTATIN) 1qd for cholesterol DICLOFENAC SODIUM 75 MG  TBEC (DICLOFENAC SODIUM) Take 1 tablet by mouth every 12 hours CYMBALTA 60 MG  CPEP (DULOXETINE HCL) one tab by mouth qd Current Allergies (reviewed today): PENICILLIN G POTASSIUM (PENICILLIN G POTASSIUM)

## 2010-03-31 NOTE — Assessment & Plan Note (Signed)
Summary: consult for dyspnea and atypical chest pain.   Referred by:  Everrett Coombe PCP:  Everrett Coombe  Chief Complaint:  Pulmonary Consult.  History of Present Illness: the patient is a 45 year old male who I've been asked to see for dyspnea and atypical chest pain. The patient gives a 1-1/2 year history of left-sided chest pain with radiation to his back. This primarily occurs in the upper part of his chest. He feels the pain has been getting worse, and is leading to loss of sleep. The patient describes the pain as a dull ache that is always there. It is not worsened by activity or by movement of his left shoulder girdle. This never completely goes away, but massage therapy does help. The last 2-3 months, the patient has also noted worsening dyspnea on exertion primarily with moderate to heavy activity. This has been an issue when hunting and with other strenuous activities. He denies any history of heart disease, and had a negative treadmill test one year ago. He denies any significant cough or mucus, but has had intermittent lower extremity edema. He had a recent chest x-ray which shows no acute process. The patient is morbidly obese and has at least a 20 pound weight gain in the last one year according to his wife.     Current Allergies: PENICILLIN G POTASSIUM (PENICILLIN G POTASSIUM)  Past Medical History:    Reviewed history and no changes required:       Current Problems:        SHORTNESS OF BREATH (ICD-786.05)       CHEST PAIN (ICD-786.50)       EXTERNAL HEMORRHOIDS (ICD-455.3)       BREAST MASS, LEFT (ICD-611.72)       ERECTILE DYSFUNCTION (ICD-302.72)       BURSITIS, ELBOW (ICD-726.33)       CHRONIC RHINITIS (ICD-472.0)       URI (ICD-465.9)       DEPRESSION (ICD-311)       ROTATOR CUFF SYNDROME, LEFT (ICD-726.10)       ALCOHOLISM, FAMILY HX (ICD-V61.41)       CORONARY ARTERY DISEASE, FAMILY HX (ICD-V17.3)       SLEEP APNEA (ICD-780.57)       HYPERLIPIDEMIA, MIXED (ICD-272.2)        HYPERTENSION (ICD-401.9)       OBESITY (ICD-278.00)       METABOLIC SYNDROME X (ICD-277.7)       RHEUMATOID ARTHRITIS (ICD-714.0)          Past Surgical History:    Reviewed history from 11/29/2006 and no changes required:       Sleep study (04/2003)       Exercise tread mill- neg (03/2006)   Family History:    Reviewed history from 11/29/2006 and no changes required:       Father: Bypass age 48, HTN       Mother: deceased age 30- ca of lymph nodes       Siblings: 2 sisters- well  Social History:    Reviewed history from 11/29/2006 and no changes required:       Marital Status: Married       Children: 3       Occupation: farmer   Risk Factors: Tobacco use:  current    Smokeless:  Yes Passive smoke exposure:  no Drug use:  no HIV high-risk behavior:  no Exercise:  no   Review of Systems      See HPI  Vital Signs:  Patient Profile:   45 Years Old Male Weight:      319.13 pounds O2 Sat:      96 % O2 treatment:    Room Air Temp:     97.8 degrees F oral Pulse rate:   82 / minute BP sitting:   130 / 80  (right arm) Cuff size:   large  Vitals Entered By: Cyndia Diver LPN (January 24, 2008 10:38 AM)             Is Patient Diabetic? No Comments Medications reviewed with patient Cyndia Diver LPN  January 24, 2008 10:39 AM      Physical Exam  General:     morbidly obese male in no acute distress Eyes:     PERRLA and EOMI.   Nose:     deviated septum to the left with narrowing Mouth:     significant elongation of the soft palate and uvula Neck:     difficult to assess for JVD due to large size, no thyromegaly or lymphadenopathy Lungs:     totally clear to auscultation Heart:     regular rate and rhythm, no MRG Abdomen:     large, soft, nontender with bowel sounds present Extremities:     1+ edema bilaterally, pulses intact distally Neurologic:     alert and oriented, moves all 4 extremities.      Impression & Recommendations:   Problem # 1:  SHORTNESS OF BREATH (ICD-786.05) I suspect his dyspnea is due to his morbid obesity with ongoing weight gain, as well as deconditioning. With his history of hypertension, I wonder if he could also have diastolic dysfunction. At this point, I will go ahead and do full pulmonary function studies, however I suspect they will only show restriction related to his obesity. The other possibility is whether he could have occult coronary disease that was missed by a routine treadmill test?  Problem # 2:  CHEST PAIN (ICD-786.50) the patient is describing atypical chest pain that really sounds musculoskeletal by his description. Because it has been so persistent, I will go ahead and do a CT scan of the chest to rule out structural pulmonary issues, and also to make sure he does not have occult thromboembolic disease. He is certainly at risk for this.   Patient Instructions: 1)  will set up for ct chest to look for structural issues or blood clots. 2)  will schedule for breathing studies same day as f/u visit with me.   ]

## 2010-03-31 NOTE — Progress Notes (Signed)
Summary: congestion, facial pressure, ringing in ears (Anthony Castillo)  Phone Note Call from Patient Call back at Home Phone [phone] Call back at [phone]   Caller: Patient Call For: bean Summary of Call: Castillo seen last week but c/o congestion, facial pressure, ringing in ears, and feels dizzy Initial call taken by: Liane Comber,  March 20, 2007 9:04 AM  Follow-up for Phone Call        I'll see at 1245 if so desires. Follow-up by: Shaune Leeks MD,  March 20, 2007 9:46 AM  Additional Follow-up for Phone Call Additional follow up Details #1::        Castillo called back before I saw this response he says we have screwed up his medicine and we are going to see him today or there are going to be problems. I advised him his dr is out of office there are no openings today I can schedule him tomorrow. He is already on schedule for tomorrow am with Willaim Sheng, but he says no he will be seen today or he will come get get his records to see another dr. I advised if he wants to go to another office we can have his sign release and can send records there. He then cursed at me and states he is not signing "shit" he will not wait and he will be in for his records. I informed him he can speak with our office manager and I put him on hold for [facility] ..................................................................Marland KitchenLiane Comber  March 20, 2007 10:48 AM noted....we can't please all of the people all of the time but we try!! Thanks for trying. Additional Follow-up by: Shaune Leeks MD,  March 20, 2007 11:06 AM    Additional Follow-up for Phone Call Additional follow up Details #2::    Castillo's wife is here she says he has been completely irrational since starting new med bupropion 300mg . he is having mood swings and it is completely out of character for him, over weekend he yelled at her and made her cry. ..................................................................Marland KitchenMarcelle Smiling Chavers   March 20, 2007 11:19 AM We offerd her the 12:45, she will have him come in then ..................................................................Marland KitchenLiane Comber  March 20, 2007 11:21 AM noted. Follow-up by: Shaune Leeks MD,  March 20, 2007 2:08 PM

## 2010-07-17 NOTE — Procedures (Signed)
[facility]                              EXERCISE TREADMILL   NAME:Anthony Castillo, Anthony Castillo                      MRN:          [id number]  DATE:03/21/2006                            DOB:          1965/05/04    HISTORY OF PRESENT ILLNESS:  Anthony Castillo is a 44 year old gentleman with  a family history of coronary disease, obesity, and hypertension. He says  that his cholesterol is borderline. He uses smokeless tobacco. He is  having no chest discomfort, per say. His baseline blood pressure today  was 146/95 with a pulse of 116. He was a bit anxious. He had taken his  amlodipine 5 mg this morning.   He exercised for a total of 7 minutes and 14 seconds. MET level achieved  was 8.9. Peak heart was 171, which is 96% of predicted maximum heart  rate. His blood pressure shot up in stage 1 to 184/95. Peak was 206/90.   His electrocardiogram showed no ST segment changes and no arrhythmias.   CONCLUSION:  1. Negative exercise GXT.  2. Baseline hypertension.  3. Exaggerated blood pressure response to exercise.  4. Reduced exercise tolerance.   RECOMMENDATIONS:  I have recommended that he talk to Billie Bean about  increasing his amlodipine to 10 mg a day. This would help his baseline  blood pressure, as well as his blood pressure with exercise. I also  suggested that he stop using smokeless tobacco.     Maisie Fus C. Daleen Squibb, MD, [facility]  Electronically Signed    TCW/MedQ  DD: 03/21/2006  DT: 03/21/2006  Job #: [id number]   cc:   Billie D. Bean, FNP

## 2011-01-04 ENCOUNTER — Other Ambulatory Visit: Payer: Self-pay | Admitting: Orthopedic Surgery

## 2011-01-18 ENCOUNTER — Other Ambulatory Visit: Payer: Self-pay | Admitting: Nurse Practitioner

## 2011-01-18 ENCOUNTER — Ambulatory Visit
Admission: RE | Admit: 2011-01-18 | Discharge: 2011-01-18 | Disposition: A | Discharge status: Home / Self Care | Payer: BC Managed Care – PPO | Source: Ambulatory Visit | Attending: Nurse Practitioner | Admitting: Nurse Practitioner

## 2011-01-18 DIAGNOSIS — R05 Cough: Secondary | ICD-10-CM

## 2011-01-18 DIAGNOSIS — R059 Cough, unspecified: Secondary | ICD-10-CM

## 2011-02-01 ENCOUNTER — Other Ambulatory Visit: Payer: Self-pay | Admitting: Internal Medicine

## 2011-02-01 ENCOUNTER — Ambulatory Visit
Admission: RE | Admit: 2011-02-01 | Discharge: 2011-02-01 | Disposition: A | Discharge status: Home / Self Care | Payer: BC Managed Care – PPO | Source: Ambulatory Visit | Attending: Internal Medicine | Admitting: Internal Medicine

## 2011-02-01 DIAGNOSIS — J189 Pneumonia, unspecified organism: Secondary | ICD-10-CM

## 2011-04-08 ENCOUNTER — Ambulatory Visit (INDEPENDENT_AMBULATORY_CARE_PROVIDER_SITE_OTHER): Payer: BC Managed Care – PPO | Admitting: Family Medicine

## 2011-04-08 ENCOUNTER — Encounter: Payer: Self-pay | Admitting: Family Medicine

## 2011-04-08 VITALS — BP 150/80 | HR 77 | Temp 97.7°F | Ht 72.0 in | Wt 345.8 lb

## 2011-04-08 DIAGNOSIS — M1990 Unspecified osteoarthritis, unspecified site: Secondary | ICD-10-CM

## 2011-04-08 DIAGNOSIS — F3289 Other specified depressive episodes: Secondary | ICD-10-CM

## 2011-04-08 DIAGNOSIS — Z23 Encounter for immunization: Secondary | ICD-10-CM

## 2011-04-08 DIAGNOSIS — E291 Testicular hypofunction: Secondary | ICD-10-CM

## 2011-04-08 DIAGNOSIS — I10 Essential (primary) hypertension: Secondary | ICD-10-CM

## 2011-04-08 DIAGNOSIS — G8929 Other chronic pain: Secondary | ICD-10-CM

## 2011-04-08 DIAGNOSIS — E782 Mixed hyperlipidemia: Secondary | ICD-10-CM

## 2011-04-08 DIAGNOSIS — F329 Major depressive disorder, single episode, unspecified: Secondary | ICD-10-CM

## 2011-04-08 NOTE — Patient Instructions (Addendum)
Lyrica: 75 mg twice daily for 3 weeks Then 50 mg twice daily for 2 weeks Then 50 mg at night before bed Then Stop  Clonazepam: Decrease to 1/2 in the morning and 1 whole pill at night for 1 week Then decrease to 1/2 a tab twice a day for 1 week Then take a 1/2 a tab in the morning for 1 week Then stop  Stop Furosemide Stop Divalproex  Try to take the pain tablets and muscle relaxants only rarely if a bad day  Recheck with me in 5 weeks

## 2011-04-08 NOTE — Progress Notes (Signed)
Patient Name: Anthony Castillo Date of Birth: 12-28-1965 Age: 46 y.o. Medical Record Number: [id number] Gender: male Date of Encounter: 04/08/2011  History of Present Illness:  Anthony Castillo is a 46 y.o. very pleasant male patient who presents with the following:  Has been seeing Tomi Bamberger.   Body mass index is 46.90 kg/(m^2).  Morbidly obese gentleman with multiple medical problems including significant severe osteoarthritis of multiple joints, hypogonadism, and chronic pain as well as significant depression and anxiety.  He is on a multitude of medications at this point.  Hypertension is relatively stable and well controlled on Norvasc 10 mgas well as also currently being on Lasix 40 mg. No diagnosis of heart failure.  Depression. No sadness. He is sometimes irritable and volatile. No hospitalizations. No suicide history. He has not seen psychiatry. Currently, he is on Prozac 40 mg, Wellbutrin 150 mg, Klonopin 0.5 mg b.i.d., and also is on Depakote ER 500 mg daily.  Hyperlipidemia. Reportedly stable. Currently on Crestor 20 mg.  Osteoarthritis and chronic pain syndrome. The patient is on multiple medications, and has not improved being on any medications. He does take some robaxin and Vicodin on a p.r.n. Basis. Additionally he is on currently Lyrica 200 mg p.o. B.i.d.  Diabetes mellitus type 2. Recent A1c's have been controlled and less than 6.5. He is on metformin 500 mg bid  Patient Active Problem List  Diagnoses  . HYPERLIPIDEMIA, MIXED  . Morbid obesity  . ERECTILE DYSFUNCTION  . DEPRESSION  . HYPERTENSION  . EXTERNAL HEMORRHOIDS  . SLEEP APNEA  . Osteoarthritis  . Chronic pain  . Hypogonadism male   Past Medical History  Diagnosis Date  . Osteoarthritis 04/09/2011  . Morbid obesity 09/22/2006  . Hypogonadism male 04/09/2011  . DEPRESSION 03/16/2007  . HYPERTENSION 09/22/2006  . Chronic pain 04/09/2011   No past surgical history on file. History    Substance Use Topics  . Smoking status: Never Smoker   . Smokeless tobacco: Current User    Types: Chew  . Alcohol Use: Yes   No family history on file. Allergies  Allergen Reactions  . Penicillins     REACTION: unspecified   Current medications have been reviewed in full.   Past Medical History, Surgical History, Social History, Family History, Problem List, Medications, and Allergies have been reviewed and updated if relevant.  Review of Systems: Significant chronic pain. Morbid obesity. No chest pain. No shortness of breath. Eating and drinking normally. Limited on his exercise. 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 Otherwise, the pertinent positives and negatives are listed above and in the HPI, otherwise a full review of systems has been reviewed and is negative unless noted positive.   Physical Examination: Filed Vitals:   04/08/11 1404  BP: 150/80  Pulse: 77  Temp: 97.7 F (36.5 C)  TempSrc: Oral  Height: 6' (1.829 m)  Weight: 345 lb 12.8 oz (156.854 kg)  SpO2: 97%    Body mass index is 46.90 kg/(m^2).   GEN: WDWN, NAD, Non-toxic, A & O x 3 HEENT: Atraumatic, Normocephalic. Neck supple. No masses, No LAD. Ears and Nose: No external deformity. CV: RRR, No M/G/R. No JVD. No thrill. No extra heart sounds. PULM: CTA B, no wheezes, crackles, rhonchi. No retractions. No resp. distress. No accessory muscle use. EXTR: No c/c/e NEURO Normal gait.  PSYCH: Normally interactive. Conversant. Not depressed or anxious appearing.  Calm demeanor.    Assessment and Plan: 1. Chronic pain  2. Need for pneumococcal vaccination  Pneumococcal polysaccharide vaccine 23-valent greater than or equal to 2yo subcutaneous/IM  3. Osteoarthritis    4. DEPRESSION    5. HYPERTENSION    6. HYPERLIPIDEMIA, MIXED    7. Morbid obesity    8. Hypogonadism male      Keep hypertension and  Hyperlipidemia medications stable.  Depression and anxiety: The patient has never seen  psychiatry, he is never been admitted, no suicidality. He does not carry a diagnosis of manic depressive illness, and he has no migraine history or history of epilepsy.  Given his current polypharmacy, I'm going to discontinue his Depakote with close followup. We're also going to attempt to titrate off of his Klonopin. The patient did not know what this medication was for or that it was potentially habit-forming.  Hypertension: Discontinued Lasix. The patient has no history of heart failure. Recheck in 4-5 weeks. At that point we may need to have another agent for hypertension.  Chronic pain and polypharmacy: Titrate off Lyrica. Conservative taper detailed with the patient. Wean down to 75 mg p.o. B.i.d., then 50 mg b.i.d.  Ultimately, I would like to get him off more medication, so that we can start fresh. This will need to be done gradually.

## 2011-04-09 ENCOUNTER — Encounter: Payer: Self-pay | Admitting: Family Medicine

## 2011-04-09 DIAGNOSIS — E291 Testicular hypofunction: Secondary | ICD-10-CM

## 2011-04-09 DIAGNOSIS — M1990 Unspecified osteoarthritis, unspecified site: Secondary | ICD-10-CM

## 2011-04-09 DIAGNOSIS — G8929 Other chronic pain: Secondary | ICD-10-CM

## 2011-04-09 HISTORY — DX: Other chronic pain: G89.29

## 2011-04-09 HISTORY — DX: Testicular hypofunction: E29.1

## 2011-04-09 HISTORY — DX: Unspecified osteoarthritis, unspecified site: M19.90

## 2011-05-10 ENCOUNTER — Ambulatory Visit (INDEPENDENT_AMBULATORY_CARE_PROVIDER_SITE_OTHER): Payer: BC Managed Care – PPO | Admitting: Family Medicine

## 2011-05-10 ENCOUNTER — Encounter: Payer: Self-pay | Admitting: Family Medicine

## 2011-05-10 ENCOUNTER — Other Ambulatory Visit: Payer: Self-pay | Admitting: Family Medicine

## 2011-05-10 VITALS — BP 130/80 | HR 95 | Temp 98.9°F | Ht 72.0 in | Wt 341.4 lb

## 2011-05-10 DIAGNOSIS — H6691 Otitis media, unspecified, right ear: Secondary | ICD-10-CM

## 2011-05-10 DIAGNOSIS — H6690 Otitis media, unspecified, unspecified ear: Secondary | ICD-10-CM

## 2011-05-10 MED ORDER — AZITHROMYCIN 250 MG PO TABS: ORAL_TABLET | ORAL | Status: AC

## 2011-05-10 NOTE — Progress Notes (Signed)
  Patient Name: Anthony Castillo Date of Birth: 11-Jul-1965 Age: 46 y.o. Medical Record Number: [id number] Gender: male Date of Encounter: 05/10/2011  History of Present Illness:  Anthony Castillo is a 46 y.o. very pleasant male patient who presents with the following:  R ear 2 weeks - sinus congestion and drainage No fever or chills Now with R ear pain Some cont congestion  Eating and drinking fine  Did well titrating off pain meds and klonopin - no difference being off of them Taking some occ nsaids and tylenol  Past Medical History, Surgical History, Social History, Family History, Problem List, Medications, and Allergies have been reviewed and updated if relevant.  Review of Systems: ROS: GEN: Acute illness details above GI: Tolerating PO intake GU: maintaining adequate hydration and urination Pulm: No SOB Interactive and getting along well at home.  Otherwise, ROS is as per the HPI.   Physical Examination: Filed Vitals:   05/10/11 1600  BP: 130/80  Pulse: 95  Temp: 98.9 F (37.2 C)  TempSrc: Oral  Height: 6' (1.829 m)  Weight: 341 lb 6.4 oz (154.858 kg)  SpO2: 98%    Body mass index is 46.30 kg/(m^2).   Gen: WDWN, NAD; A & O x3, cooperative. Pleasant.Globally Non-toxic HEENT: Normocephalic and atraumatic. Throat clear, w/o exudate, R TM indistinct LM. Some tm scarring, L TM - good landmarks, No fluid present. rhinnorhea.  MMM Frontal sinuses: NT Max sinuses: NT NECK: Anterior cervical  LAD is absent CV: RRR, No M/G/R, cap refill <2 sec PULM: Breathing comfortably in no respiratory distress. no wheezing, crackles, rhonchi EXT: No c/c/e PSYCH: Friendly, good eye contact MSK: Nml gait    Assessment and Plan:  1. Otitis media, right     Medications Today: Meds ordered this encounter  Medications  . azithromycin (ZITHROMAX Z-PAK) 250 MG tablet    Sig: Take 2 tablets (500 mg) on  Day 1,  followed by 1 tablet (250 mg) once daily on Days 2  through 5.    Dispense:  6 each    Refill:  0    Reassured regarding titration off prior pain meds and anxiety meds Keep all stable now

## 2011-06-05 ENCOUNTER — Other Ambulatory Visit: Payer: Self-pay | Admitting: Family Medicine

## 2011-06-07 ENCOUNTER — Ambulatory Visit: Payer: BC Managed Care – PPO | Admitting: Family Medicine

## 2011-06-07 ENCOUNTER — Ambulatory Visit (INDEPENDENT_AMBULATORY_CARE_PROVIDER_SITE_OTHER): Payer: BC Managed Care – PPO | Admitting: Family Medicine

## 2011-06-07 ENCOUNTER — Encounter: Payer: Self-pay | Admitting: Family Medicine

## 2011-06-07 ENCOUNTER — Telehealth: Payer: Self-pay | Admitting: Family Medicine

## 2011-06-07 VITALS — BP 130/82 | HR 118 | Temp 98.6°F | Ht 73.0 in | Wt 335.8 lb

## 2011-06-07 DIAGNOSIS — M79645 Pain in left finger(s): Secondary | ICD-10-CM

## 2011-06-07 DIAGNOSIS — H6690 Otitis media, unspecified, unspecified ear: Secondary | ICD-10-CM

## 2011-06-07 DIAGNOSIS — M79609 Pain in unspecified limb: Secondary | ICD-10-CM

## 2011-06-07 MED ORDER — PREDNISONE 20 MG PO TABS: ORAL_TABLET | ORAL | Status: DC

## 2011-06-07 MED ORDER — SULFAMETHOXAZOLE-TMP DS 800-160 MG PO TABS: 1.0000 | ORAL_TABLET | Freq: Two times a day (BID) | ORAL | Status: AC

## 2011-06-07 NOTE — Progress Notes (Signed)
  Patient Name: Anthony Castillo Date of Birth: September 01, 1965 Age: 46 y.o. Medical Record Number: [id number] Gender: male Date of Encounter: 06/07/2011  History of Present Illness:  Anthony Castillo is a 46 y.o. very pleasant male patient who presents with the following:  OM 1 month ago:  This penicillin allergic patient was seen one month ago, diagnosed with otitis media, placed on azithromycin, and had some improvement in his ear pain, but then it recurred about 10 days to 2 weeks later. He still having significant discomfort, RIGHT greater than LEFT. Some trouble hearing and pain. No fever or chills.  Past Medical History, Surgical History, Social History, Family History, Problem List, Medications, and Allergies have been reviewed and updated if relevant.  Review of Systems: ROS: GEN: Acute illness details above GI: Tolerating PO intake GU: maintaining adequate hydration and urination Pulm: No SOB Interactive and getting along well at home.  Otherwise, ROS is as per the HPI.   Physical Examination: Filed Vitals:   06/07/11 1408  BP: 130/82  Pulse: 118  Temp: 98.6 F (37 C)  TempSrc: Oral  Height: 6\' 1"  (1.854 m)  Weight: 335 lb 12.8 oz (152.318 kg)  SpO2: 95%    Body mass index is 44.30 kg/(m^2).   GEN: WDWN, NAD, Non-toxic, Alert & Oriented x 3 HEENT: Atraumatic, Normocephalic. RIGHT TM is diffusely bulging, red in appearance with indistinct landmarks. LEFT TM appears to have some serous fluid. Ears and Nose: No external deformity. EXTR: No clubbing/cyanosis/edema NEURO: Normal gait.  PSYCH: Normally interactive. Conversant. Not depressed or anxious appearing.  Calm demeanor.    Assessment and Plan:  Recurrent otitis media. Penicillin allergic patient. Placed the patient on Septra DS one tablet p.o. B.i.d. For 14 days and one-week worth of prednisone. Recurrent otitis media and reviewed and literature.  The patient did not have finger pain. This is in  order entry error in the health record, but I could not remove it. The order was canceled.  I could not visualize the patient's RIGHT tympanic membrane, so an ear wash system which used to irrigate his RIGHT ear canal successfully. Subsequently, RIGHT tympanic membrane was visualized easily.

## 2011-06-07 NOTE — Telephone Encounter (Signed)
Patient wants to stay at the 4:15 appt

## 2011-06-07 NOTE — Telephone Encounter (Signed)
Caller: Cathy/Spouse; PCP: Hannah Beat T.; CB#: [phone]; Call regarding Ear Pain. Pt c/o an earache onset 06/05/11. He was tx'd for an ear infection approx 3 wk ago. Sx did go away. Pt is currently farming, on his tractor. Emergent sx r/o. Appt sched for 1615 today with Dr. Para March.

## 2011-06-07 NOTE — Telephone Encounter (Signed)
I would be happy to see my patient myself. Can you help put on my schedule. Double book them at 2 PM - or put at 1:45 Will need to call pt's wife with new time  Cc: Lugene

## 2011-07-06 ENCOUNTER — Other Ambulatory Visit: Payer: Self-pay | Admitting: Family Medicine

## 2011-08-05 ENCOUNTER — Other Ambulatory Visit: Payer: Self-pay | Admitting: Family Medicine

## 2011-08-18 ENCOUNTER — Encounter: Payer: Self-pay | Admitting: Family Medicine

## 2011-08-18 ENCOUNTER — Ambulatory Visit (INDEPENDENT_AMBULATORY_CARE_PROVIDER_SITE_OTHER): Payer: BC Managed Care – PPO | Admitting: Family Medicine

## 2011-08-18 VITALS — BP 140/78 | HR 86 | Temp 99.0°F | Ht 73.0 in | Wt 337.8 lb

## 2011-08-18 DIAGNOSIS — J069 Acute upper respiratory infection, unspecified: Secondary | ICD-10-CM

## 2011-08-18 MED ORDER — FLUTICASONE PROPIONATE 50 MCG/ACT NA SUSP: 2.0000 | Freq: Every day | NASAL | Status: DC

## 2011-08-18 MED ORDER — AZITHROMYCIN 250 MG PO TABS: ORAL_TABLET | ORAL | Status: AC

## 2011-08-18 NOTE — Progress Notes (Signed)
Nature conservation officer at [facility] [address] [place] [address] Phone: [phone] Fax: [phone]   Patient Name: Anthony Castillo Date of Birth: [birth year] Age: 46 y.o. Medical Record Number: [id number] Gender: male Date of Encounter: 08/18/2011  History of Present Illness:  Anthony Castillo is a 46 y.o. very pleasant male patient who presents with the following:  Feeling terrible for about 4-5 days.  Nothing hurts aall that particularly  One congestion without much significant cough. Is having some mild ear fullness. No significant pain. No sore throat. He is not really having sinus pain. No nausea, vomiting, or diarrhea. He is not pulling ticks off. Muscle and joint aches or not different compared to normal baseline.  Past Medical History, Surgical History, Social History, Family History, Problem List, Medications, and Allergies have been reviewed and updated if relevant.  Prior to Admission medications   Medication Sig Start Date End Date Taking? Authorizing Provider  acetaminophen (TYLENOL) 650 MG CR tablet Take 650 mg by mouth every 8 (eight) hours as needed.   Yes Historical Provider, MD  amLODipine (NORVASC) 10 MG tablet Take 1 tablet by mouth daily. 04/05/11  Yes Historical Provider, MD  aspirin 81 MG tablet Take 160 mg by mouth every other day.   Yes Historical Provider, MD  buPROPion (WELLBUTRIN SR) 150 MG 12 hr tablet TAKE 1 TABLET BY MOUTH TWICE A DAY 06/05/11  Yes Garry Nicolini, MD  cetirizine (ZYRTEC) 10 MG tablet Take 10 mg by mouth daily.   Yes Historical Provider, MD  Cholecalciferol (D3 SUPER STRENGTH) 2000 UNITS CAPS Take 1 capsule by mouth daily.   Yes Historical Provider, MD  CRESTOR 20 MG tablet TAKE 1 TABLET BY MOUTH AT BEDTIME 08/05/11  Yes Tanasia Budzinski, MD  FLUoxetine (PROZAC) 40 MG capsule TAKE ONE CAPSULE BY MOUTH EVERY DAY 05/10/11  Yes Lakasha Mcfall, MD  Glucosamine-Chondroit-Vit C-Mn (GLUCOSAMINE CHONDR 1500 COMPLX PO) Take 2  capsules by mouth daily.   Yes Historical Provider, MD  metFORMIN (GLUCOPHAGE) 500 MG tablet TAKE 1 TABLET BY MOUTH 2 TIMES A DAY 07/06/11  Yes Hannah Beat, MD  Multiple Vitamin (MULTIVITAMIN) tablet Take 1 tablet by mouth daily.   Yes Historical Provider, MD  Omega-3 Fatty Acids (OMEGA-3 FISH OIL) 500 MG CAPS Take 1 tablet by mouth daily.   Yes Historical Provider, MD  pantoprazole (PROTONIX) 40 MG tablet Take 1 tablet by mouth every morning. 04/05/11  Yes Historical Provider, MD  potassium gluconate 595 MG TABS Take 595 mg by mouth daily.   Yes Historical Provider, MD  predniSONE (DELTASONE) 20 MG tablet 2 tabs daily for 5 days, then 1 tab daily for 2 days 06/07/11  Yes Hannah Beat, MD    Review of Systems: ROS: GEN: Acute illness details above GI: Tolerating PO intake GU: maintaining adequate hydration and urination Pulm: No SOB Interactive and getting along well at home.  Otherwise, ROS is as per the HPI.   Physical Examination: Filed Vitals:   08/18/11 1422  BP: 140/78  Pulse: 86  Temp: 99 F (37.2 C)   Filed Vitals:   08/18/11 1422  Height: 6\' 1"  (1.854 m)  Weight: 337 lb 12 oz (153.202 kg)   Body mass index is 44.56 kg/(m^2). Ideal Body Weight: Weight in (lb) to have BMI = 25: 189.1    Gen: WDWN, NAD; A & O x3, cooperative. Pleasant.Globally Non-toxic HEENT: Normocephalic and atraumatic. Throat clear, w/o exudate, R TM clear, L TM - good landmarks, some serous  fluid present. rhinnorhea.  MMM Frontal sinuses: NT Max sinuses: NT NECK: Anterior cervical  LAD is absent CV: RRR, No M/G/R, cap refill <2 sec PULM: Breathing comfortably in no respiratory distress. no wheezing, crackles, rhonchi EXT: No c/c/e PSYCH: Friendly, good eye contact MSK: Nml gait  Probable URI. He may have an allergic component as well. Add some Flonase and I have given him a Z-Pak that he can hold onto, in case he develops fever and worsens over the weekend.  Assessment and Plan: 1. URI  (upper respiratory infection)       Hannah Beat, MD

## 2011-09-06 ENCOUNTER — Encounter: Payer: Self-pay | Admitting: Family Medicine

## 2011-09-06 ENCOUNTER — Ambulatory Visit (INDEPENDENT_AMBULATORY_CARE_PROVIDER_SITE_OTHER): Payer: BC Managed Care – PPO | Admitting: Family Medicine

## 2011-09-06 VITALS — BP 120/82 | HR 87 | Temp 98.5°F | Ht 73.0 in | Wt 340.0 lb

## 2011-09-06 DIAGNOSIS — M7710 Lateral epicondylitis, unspecified elbow: Secondary | ICD-10-CM

## 2011-09-06 DIAGNOSIS — M7711 Lateral epicondylitis, right elbow: Secondary | ICD-10-CM

## 2011-09-06 NOTE — Progress Notes (Signed)
Patient presents with lateral elbow pain.  Length of symptoms: 3-4 weeks Hand effected: R  Patient describes a dull ache on the lateral elbow. There is some translation in the proximal forearm and in the distal upper arm. It is painful to lift with the hand facing down and to lift with the thumb in an upright position. Supination is painful. Patient points to the lateral epicondyle as the point of maximal tenderness near ECRB.  No trauma.   No prior fractures or operative interventions in the effective hand. Prior PT or HEP: none  Denies numbness or tingling. No significant neck or shoulder pain.  Hand of dominance: R Occupation, farming  REVIEW OF SYSTEMS  GEN: No fevers, chills. Nontoxic. Primarily MSK c/o today. MSK: Detailed in the HPI GI: tolerating PO intake without difficulty Neuro: No numbness, parasthesias, or tingling associated. Otherwise the pertinent positives of the ROS are noted above.   PHYSICAL EXAM  Blood pressure 120/82, pulse 87, temperature 98.5 F (36.9 C), temperature source Oral, height 6\' 1"  (1.854 m), weight 340 lb (154.223 kg), SpO2 96.00%.  GEN: Well-developed,well-nourished,in no acute distress; alert,appropriate and cooperative throughout examination HEENT: Normocephalic and atraumatic without obvious abnormalities. Ears, externally no deformities PULM: Breathing comfortably in no respiratory distress EXT: No clubbing, cyanosis, or edema PSYCH: Normally interactive. Cooperative during the interview. Pleasant. Friendly and conversant. Not anxious or depressed appearing. Normal, full affect.  R elbow Ecchymosis or edema: neg ROM: full flexion, extension, pronation, supination Shoulder ROM: Full Flexion: 5/5 Extension: 5/5, PAINFUL Supination: 5/5, PAINFUL Pronation: 5/5 Wrist ext: 5/5 Wrist flexion: 5/5 No gross bony abnormality Varus and Valgus stress: stable ECRB tenderness: YES, TTP Medial epicondyle: NT Lateral epicondyle, resisted  wrist extension from wrist full pronation and flexion: PAINFUL grip: 5/5  sensation intact Tinel's, Elbow: negative  A/P: Lateral Epicondylitis: Elbow anatomy was reviewed, and tendinopathy was explained.  Pt. given a formal rehab program from [employer] Use counterforce strap if working or using hands - large forearm, will have to look on internet for sizing Emphasized stretching an cross-friction massage Emphasized proper palms up lifting biomechanics to unload ECRB   Lateral Epicondylitis Injection, RIGHT Verbal consent was obtained from the patient. Risks, benefits, and alternatives were discussed. Potential complications including loss of pigment, atrophy, and rare risk of infection were discussed. Prepped with Chloraprep and Ethyl Chloride used for anesthesia. Under sterile conditions, the patient was injected at the point of maximal tenderness at the ECRB tendon with 3/4 cc of Lidocaine 1% and 3/4 cc of Depo-Medrol 40 mg. Decreased pain after injection. No complications.  Needle size: 22 gauge 1 1/2 inch

## 2011-10-05 ENCOUNTER — Other Ambulatory Visit: Payer: Self-pay | Admitting: Family Medicine

## 2011-10-29 ENCOUNTER — Other Ambulatory Visit: Payer: Self-pay | Admitting: Family Medicine

## 2011-11-06 ENCOUNTER — Other Ambulatory Visit: Payer: Self-pay | Admitting: Family Medicine

## 2011-12-06 ENCOUNTER — Other Ambulatory Visit: Payer: Self-pay | Admitting: Family Medicine

## 2012-01-03 ENCOUNTER — Other Ambulatory Visit: Payer: Self-pay | Admitting: Family Medicine

## 2012-01-03 NOTE — Telephone Encounter (Signed)
Refilled x 3 months per protocol

## 2012-01-05 ENCOUNTER — Other Ambulatory Visit: Payer: Self-pay | Admitting: Family Medicine

## 2012-01-07 ENCOUNTER — Other Ambulatory Visit: Payer: Self-pay | Admitting: Family Medicine

## 2012-02-09 ENCOUNTER — Other Ambulatory Visit: Payer: Self-pay | Admitting: Family Medicine

## 2012-02-25 ENCOUNTER — Other Ambulatory Visit: Payer: Self-pay | Admitting: Family Medicine

## 2012-03-09 ENCOUNTER — Other Ambulatory Visit: Payer: Self-pay | Admitting: Family Medicine

## 2012-04-11 ENCOUNTER — Other Ambulatory Visit: Payer: Self-pay | Admitting: Family Medicine

## 2012-05-03 ENCOUNTER — Other Ambulatory Visit: Payer: Self-pay | Admitting: Family Medicine

## 2012-05-11 ENCOUNTER — Other Ambulatory Visit: Payer: Self-pay | Admitting: Family Medicine

## 2012-06-08 ENCOUNTER — Other Ambulatory Visit: Payer: Self-pay | Admitting: Family Medicine

## 2012-06-30 ENCOUNTER — Other Ambulatory Visit: Payer: Self-pay | Admitting: Family Medicine

## 2012-07-10 ENCOUNTER — Other Ambulatory Visit: Payer: Self-pay | Admitting: Family Medicine

## 2012-07-30 HISTORY — PX: KNEE ARTHROSCOPY: SUR90

## 2012-10-03 ENCOUNTER — Other Ambulatory Visit: Payer: Self-pay | Admitting: Family Medicine

## 2012-10-08 ENCOUNTER — Other Ambulatory Visit: Payer: Self-pay | Admitting: Family Medicine

## 2012-10-09 NOTE — Telephone Encounter (Signed)
Ok to refill 

## 2012-10-10 ENCOUNTER — Other Ambulatory Visit: Payer: Self-pay | Admitting: *Deleted

## 2012-10-10 MED ORDER — METFORMIN HCL 500 MG PO TABS: ORAL_TABLET | ORAL | Status: DC

## 2012-10-10 MED ORDER — BUPROPION HCL ER (SR) 150 MG PO TB12: ORAL_TABLET | ORAL | Status: DC

## 2012-10-10 NOTE — Telephone Encounter (Signed)
HAs not been seen for diabetes in quite sometime. Make appt with Dr. Patsy Lager for DM follow up and refill until then

## 2012-10-11 NOTE — Telephone Encounter (Signed)
Advised patient's wife that pt will need office visit before further refills on metformin.  She said she would check with the patient and will call back to schedule.

## 2012-11-05 ENCOUNTER — Other Ambulatory Visit: Payer: Self-pay | Admitting: Family Medicine

## 2012-11-05 NOTE — Telephone Encounter (Signed)
Last office visit 08/2011.  Was told on last refill to schedule an appointment.  No appointment scheduled at this time.  Refill?

## 2012-11-06 NOTE — Telephone Encounter (Signed)
Advised patient his prescriptions have been sent to pharmacy.  Informed him that he is past due for an appointment.  Wife got on the phone and states she will call back to schedule an appointment next month because Anthony Castillo is busy in Tobacco right now.

## 2012-11-06 NOTE — Telephone Encounter (Signed)
Ok to refill wellbutrin #60, 2 refills norvasc #30, 2 refills  F/u CPX in next couple of months

## 2013-05-09 ENCOUNTER — Other Ambulatory Visit: Payer: Self-pay | Admitting: Orthopedic Surgery

## 2013-05-14 ENCOUNTER — Encounter (HOSPITAL_BASED_OUTPATIENT_CLINIC_OR_DEPARTMENT_OTHER): Payer: Self-pay | Admitting: *Deleted

## 2013-05-14 NOTE — Progress Notes (Signed)
To come in for bmet-ekg-had stress test 08-neg-does use a cpap-will bring and use post op-had knee last summer [facility]-did well-

## 2013-05-15 ENCOUNTER — Encounter (HOSPITAL_BASED_OUTPATIENT_CLINIC_OR_DEPARTMENT_OTHER)
Admission: RE | Admit: 2013-05-15 | Discharge: 2013-05-15 | Disposition: A | Discharge status: Home / Self Care | Payer: BC Managed Care – PPO | Source: Ambulatory Visit | Attending: Orthopedic Surgery | Admitting: Orthopedic Surgery

## 2013-05-15 LAB — BASIC METABOLIC PANEL
BUN: 13 mg/dL (ref 6–23)
CO2: 25 mEq/L (ref 19–32)
Calcium: 9.4 mg/dL (ref 8.4–10.5)
Chloride: 101 mEq/L (ref 96–112)
Creatinine, Ser: 0.95 mg/dL (ref 0.50–1.35)
GFR calc Af Amer: >90 mL/min (ref >90)
GFR calc non Af Amer: >90 mL/min (ref >90)
Glucose, Bld: 176 mg/dL — ABNORMAL HIGH (ref 70–99)
Potassium: 3.7 mEq/L (ref 3.7–5.3)
Sodium: 141 mEq/L (ref 137–147)

## 2013-05-16 NOTE — H&P (Signed)
Anthony Castillo is an 48 y.o. male.   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 He presents for evaluation of numbness in his right and left hands, worse on the right than left. He has noted significant weakness of grip. He was diagnosed to have type II diabetes approximately one year ago. His last hemoglobin A1C was less than 6. He has no history of fracture or other trauma to his hands or wrists. He has had no neck injuries. He is awakening at night with numbness bilaterally.    Past Medical History  Diagnosis Date  . Osteoarthritis 04/09/2011  . Morbid obesity 09/22/2006  . Hypogonadism male 04/09/2011  . DEPRESSION 03/16/2007  . HYPERTENSION 09/22/2006  . Chronic pain 04/09/2011  . Wears glasses   . Sleep apnea     uses a cpap-will use after surgery  . GERD (gastroesophageal reflux disease)     Past Surgical History  Procedure Laterality Date  . Tonsillectomy    . Wisdom tooth extraction    . Knee arthroscopy  6/14    right    History reviewed. No pertinent family history. Social History:  reports that he has never smoked. His smokeless tobacco use includes Chew. He reports that he drinks alcohol. He reports that he does not use illicit drugs.  Allergies:  Allergies  Allergen Reactions  . Penicillins     REACTION: unspecified    No prescriptions prior to admission    Results for orders placed during the hospital encounter of 05/17/13 (from the past 48 hour(s))  BASIC METABOLIC PANEL     Status: Abnormal   Collection Time    05/15/13  9:22 AM      Result Value Ref Range   Sodium 141  137 - 147 mEq/L   Potassium 3.7  3.7 - 5.3 mEq/L   Chloride 101  96 - 112 mEq/L   CO2 25  19 - 32 mEq/L   Glucose, Bld 176 (*) 70 - 99 mg/dL   BUN 13  6 - 23 mg/dL   Creatinine, Ser 0.95  0.50 - 1.35 mg/dL   Calcium 9.4  8.4 - 10.5 mg/dL   GFR calc non Af Amer >90  >90 mL/min   GFR calc Af Amer >90  >90 mL/min   Comment: (NOTE)     The eGFR has been calculated using the CKD EPI equation.     This calculation has not been validated in all clinical situations.     eGFR's persistently <90 mL/min signify possible Chronic Kidney     Disease.    No results found.   Pertinent items are noted in HPI.  Height _0  (1.854 m), weight 154.223 kg (340 lb).  General appearance: alert Head: Normocephalic, without obvious abnormality Neck: supple, symmetrical, trachea midline Resp: clear to auscultation bilaterally Cardio: regular rate and rhythm GI: normal findings: bowel sounds normal  Extremities: Inspection of his hands reveals thick calluses and signs of hard work. He has full ROM of his fingers in flexion/extension. His pulses and capillary refill are intact. He has a positive wrist flexion test bilaterally. He has no signs of stenosing tenosynovitis of the thumbs, fingers or first dorsal compartments. He has some prominent of his right elbow suggesting either an osteophyte at the olecranon or chronic bursitis of the olecranon bursa.  X-rays of his right elbow AP lateral demonstrate chronic epicondylitis bilaterally with  reactive bone formation. His humeral ulnar joint is well preserved. The radiocapitellar joint is well preserved.   His lateral film demonstrates a large olecranon osteophyte with small ossicle fragment in the triceps.   His hand films demonstrate an ossicle at the radial critical corner at the base of his proximal phalanx at the MP joint consistent with prior trauma.  Dr. Zebedee Iba completed electrodiagnostic studies. 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  Plan: To the OR for Right CTR and decompression ulnar  nerve at right elbow.The procedure, risks,benefits and post-op course were discussed with the patient at length and they were in agreement with the plan.  DASNOIT,Alphonse Asbridge J 05/16/2013, 3:59 PM  H&P documentation: 05/17/2013  -History and Physical Reviewed  -Patient has been re-examined  -No change in the plan of care  Cammie Sickle, MD

## 2013-05-17 ENCOUNTER — Encounter (HOSPITAL_BASED_OUTPATIENT_CLINIC_OR_DEPARTMENT_OTHER)
Admission: RE | Disposition: A | Discharge status: Home / Self Care | Payer: Self-pay | Source: Ambulatory Visit | Attending: Orthopedic Surgery

## 2013-05-17 ENCOUNTER — Encounter (HOSPITAL_BASED_OUTPATIENT_CLINIC_OR_DEPARTMENT_OTHER): Payer: Self-pay | Admitting: Anesthesiology

## 2013-05-17 ENCOUNTER — Ambulatory Visit (HOSPITAL_BASED_OUTPATIENT_CLINIC_OR_DEPARTMENT_OTHER)
Admission: RE | Admit: 2013-05-17 | Discharge: 2013-05-17 | Disposition: A | Discharge status: Home / Self Care | Payer: BC Managed Care – PPO | Source: Ambulatory Visit | Attending: Orthopedic Surgery | Admitting: Orthopedic Surgery

## 2013-05-17 ENCOUNTER — Ambulatory Visit (HOSPITAL_BASED_OUTPATIENT_CLINIC_OR_DEPARTMENT_OTHER): Payer: BC Managed Care – PPO | Admitting: Anesthesiology

## 2013-05-17 ENCOUNTER — Encounter (HOSPITAL_BASED_OUTPATIENT_CLINIC_OR_DEPARTMENT_OTHER): Payer: BC Managed Care – PPO | Admitting: Anesthesiology

## 2013-05-17 DIAGNOSIS — F3289 Other specified depressive episodes: Secondary | ICD-10-CM | POA: Insufficient documentation

## 2013-05-17 DIAGNOSIS — F329 Major depressive disorder, single episode, unspecified: Secondary | ICD-10-CM | POA: Insufficient documentation

## 2013-05-17 DIAGNOSIS — G4730 Sleep apnea, unspecified: Secondary | ICD-10-CM | POA: Insufficient documentation

## 2013-05-17 DIAGNOSIS — G5620 Lesion of ulnar nerve, unspecified upper limb: Secondary | ICD-10-CM | POA: Insufficient documentation

## 2013-05-17 DIAGNOSIS — I10 Essential (primary) hypertension: Secondary | ICD-10-CM | POA: Insufficient documentation

## 2013-05-17 DIAGNOSIS — M1990 Unspecified osteoarthritis, unspecified site: Secondary | ICD-10-CM | POA: Insufficient documentation

## 2013-05-17 DIAGNOSIS — G5600 Carpal tunnel syndrome, unspecified upper limb: Secondary | ICD-10-CM | POA: Insufficient documentation

## 2013-05-17 DIAGNOSIS — K219 Gastro-esophageal reflux disease without esophagitis: Secondary | ICD-10-CM | POA: Insufficient documentation

## 2013-05-17 HISTORY — PX: CARPAL TUNNEL RELEASE: SHX101

## 2013-05-17 HISTORY — DX: Presence of spectacles and contact lenses: Z97.3

## 2013-05-17 HISTORY — PX: ULNAR NERVE TRANSPOSITION: SHX2595

## 2013-05-17 HISTORY — DX: Gastro-esophageal reflux disease without esophagitis: K21.9

## 2013-05-17 HISTORY — DX: Sleep apnea, unspecified: G47.30

## 2013-05-17 LAB — POCT HEMOGLOBIN-HEMACUE: Hemoglobin: 12.8 g/dL — ABNORMAL LOW (ref 13.0–17.0)

## 2013-05-17 LAB — GLUCOSE, CAPILLARY
Glucose-Capillary: 110 mg/dL — ABNORMAL HIGH (ref 70–99)
Glucose-Capillary: 118 mg/dL — ABNORMAL HIGH (ref 70–99)

## 2013-05-17 SURGERY — ULNAR NERVE DECOMPRESSION/TRANSPOSITION
Anesthesia: General | Site: Wrist | Laterality: Right

## 2013-05-17 MED ORDER — VANCOMYCIN HCL 10 G IV SOLR
1500.0000 mg | INTRAVENOUS | Status: AC
  Administered 2013-05-17: 1500 mg via INTRAVENOUS

## 2013-05-17 MED ORDER — ONDANSETRON HCL 4 MG/2ML IJ SOLN
INTRAMUSCULAR | Status: DC | PRN
  Administered 2013-05-17: 4 mg via INTRAVENOUS

## 2013-05-17 MED ORDER — MIDAZOLAM HCL 2 MG/2ML IJ SOLN
INTRAMUSCULAR | Status: AC
  Filled 2013-05-17: qty 2

## 2013-05-17 MED ORDER — VANCOMYCIN HCL IN DEXTROSE 1-5 GM/200ML-% IV SOLN
INTRAVENOUS | Status: AC
  Filled 2013-05-17: qty 200

## 2013-05-17 MED ORDER — LIDOCAINE HCL 2 % IJ SOLN
INTRAMUSCULAR | Status: DC | PRN
  Administered 2013-05-17: 7 mL

## 2013-05-17 MED ORDER — OXYCODONE HCL 5 MG PO TABS
5.0000 mg | ORAL_TABLET | Freq: Once | ORAL | Status: AC | PRN
Start: 2013-05-17 — End: 2013-05-17
  Administered 2013-05-17: 5 mg via ORAL

## 2013-05-17 MED ORDER — CHLORHEXIDINE GLUCONATE 4 % EX LIQD: 60.0000 mL | Freq: Once | CUTANEOUS | Status: DC

## 2013-05-17 MED ORDER — METOCLOPRAMIDE HCL 5 MG/ML IJ SOLN: 10.0000 mg | Freq: Once | INTRAMUSCULAR | Status: DC | PRN

## 2013-05-17 MED ORDER — OXYCODONE HCL 5 MG PO TABS
ORAL_TABLET | ORAL | Status: AC
  Filled 2013-05-17: qty 1

## 2013-05-17 MED ORDER — LIDOCAINE HCL (CARDIAC) 20 MG/ML IV SOLN
INTRAVENOUS | Status: DC | PRN
  Administered 2013-05-17: 100 mg via INTRAVENOUS

## 2013-05-17 MED ORDER — OXYCODONE HCL 5 MG/5ML PO SOLN: 5.0000 mg | Freq: Once | ORAL | Status: AC | PRN

## 2013-05-17 MED ORDER — MIDAZOLAM HCL 2 MG/2ML IJ SOLN: 1.0000 mg | INTRAMUSCULAR | Status: DC | PRN

## 2013-05-17 MED ORDER — OXYCODONE-ACETAMINOPHEN 5-325 MG PO TABS: ORAL_TABLET | ORAL | Status: DC

## 2013-05-17 MED ORDER — FENTANYL CITRATE 0.05 MG/ML IJ SOLN: 50.0000 ug | INTRAMUSCULAR | Status: DC | PRN

## 2013-05-17 MED ORDER — LACTATED RINGERS IV SOLN
INTRAVENOUS | Status: DC
  Administered 2013-05-17 (×2): via INTRAVENOUS

## 2013-05-17 MED ORDER — MIDAZOLAM HCL 5 MG/5ML IJ SOLN
INTRAMUSCULAR | Status: DC | PRN
  Administered 2013-05-17: 2 mg via INTRAVENOUS

## 2013-05-17 MED ORDER — HYDROMORPHONE HCL PF 1 MG/ML IJ SOLN: 0.2500 mg | INTRAMUSCULAR | Status: DC | PRN

## 2013-05-17 MED ORDER — FENTANYL CITRATE 0.05 MG/ML IJ SOLN
INTRAMUSCULAR | Status: AC
  Filled 2013-05-17: qty 6

## 2013-05-17 MED ORDER — FENTANYL CITRATE 0.05 MG/ML IJ SOLN
INTRAMUSCULAR | Status: DC | PRN
  Administered 2013-05-17: 100 ug via INTRAVENOUS
  Administered 2013-05-17: 50 ug via INTRAVENOUS

## 2013-05-17 MED ORDER — VANCOMYCIN HCL IN DEXTROSE 500-5 MG/100ML-% IV SOLN
INTRAVENOUS | Status: AC
  Filled 2013-05-17: qty 100

## 2013-05-17 MED ORDER — PROPOFOL 10 MG/ML IV BOLUS
INTRAVENOUS | Status: DC | PRN
  Administered 2013-05-17: 300 mg via INTRAVENOUS

## 2013-05-17 SURGICAL SUPPLY — 56 items
BANDAGE ADH SHEER 1  50/CT (GAUZE/BANDAGES/DRESSINGS) IMPLANT
BANDAGE ELASTIC 3 VELCRO ST LF (GAUZE/BANDAGES/DRESSINGS) ×3 IMPLANT
BANDAGE ELASTIC 4 VELCRO ST LF (GAUZE/BANDAGES/DRESSINGS) IMPLANT
BLADE MINI RND TIP GREEN BEAV (BLADE) IMPLANT
BLADE SURG 15 STRL LF DISP TIS (BLADE) ×2 IMPLANT
BLADE SURG 15 STRL SS (BLADE) ×3
BNDG CMPR 9X4 STRL LF SNTH (GAUZE/BANDAGES/DRESSINGS) ×2
BNDG COHESIVE 3X5 TAN STRL LF (GAUZE/BANDAGES/DRESSINGS) ×1 IMPLANT
BNDG COHESIVE 4X5 TAN STRL (GAUZE/BANDAGES/DRESSINGS) IMPLANT
BNDG ESMARK 4X9 LF (GAUZE/BANDAGES/DRESSINGS) ×1 IMPLANT
BRUSH SCRUB EZ PLAIN DRY (MISCELLANEOUS) ×3 IMPLANT
CORDS BIPOLAR (ELECTRODE) ×3 IMPLANT
COVER MAYO STAND STRL (DRAPES) ×3 IMPLANT
COVER TABLE BACK 60X90 (DRAPES) ×3 IMPLANT
CUFF TOURNIQUET SINGLE 18IN (TOURNIQUET CUFF) IMPLANT
DECANTER SPIKE VIAL GLASS SM (MISCELLANEOUS) IMPLANT
DRAPE EXTREMITY T 121X128X90 (DRAPE) ×3 IMPLANT
DRAPE SURG 17X23 STRL (DRAPES) ×3 IMPLANT
DRSG TEGADERM 4X4.75 (GAUZE/BANDAGES/DRESSINGS) ×4 IMPLANT
GLOVE BIOGEL M STRL SZ7.5 (GLOVE) ×3 IMPLANT
GLOVE BIOGEL PI IND STRL 6.5 (GLOVE) IMPLANT
GLOVE BIOGEL PI IND STRL 7.0 (GLOVE) IMPLANT
GLOVE BIOGEL PI INDICATOR 6.5 (GLOVE) ×1
GLOVE BIOGEL PI INDICATOR 7.0 (GLOVE) ×1
GLOVE ECLIPSE 6.5 STRL STRAW (GLOVE) ×2 IMPLANT
GLOVE EXAM NITRILE LRG STRL (GLOVE) ×1 IMPLANT
GLOVE ORTHO TXT STRL SZ7.5 (GLOVE) ×3 IMPLANT
GOWN STRL REUS W/ TWL LRG LVL3 (GOWN DISPOSABLE) ×2 IMPLANT
GOWN STRL REUS W/ TWL XL LVL3 (GOWN DISPOSABLE) ×4 IMPLANT
GOWN STRL REUS W/TWL LRG LVL3 (GOWN DISPOSABLE) ×6
GOWN STRL REUS W/TWL XL LVL3 (GOWN DISPOSABLE) ×3
LOOP VESSEL MAXI BLUE (MISCELLANEOUS) IMPLANT
NEEDLE 27GAX1X1/2 (NEEDLE) ×1 IMPLANT
PACK BASIN DAY SURGERY FS (CUSTOM PROCEDURE TRAY) ×3 IMPLANT
PAD CAST 3X4 CTTN HI CHSV (CAST SUPPLIES) ×2 IMPLANT
PADDING CAST ABS 4INX4YD NS (CAST SUPPLIES) ×1
PADDING CAST ABS COTTON 4X4 ST (CAST SUPPLIES) ×2 IMPLANT
PADDING CAST COTTON 3X4 STRL (CAST SUPPLIES) ×3
SLEEVE SCD COMPRESS KNEE MED (MISCELLANEOUS) ×1 IMPLANT
SPLINT PLASTER CAST XFAST 3X15 (CAST SUPPLIES) ×10 IMPLANT
SPLINT PLASTER XTRA FASTSET 3X (CAST SUPPLIES) ×5
SPONGE GAUZE 4X4 12PLY (GAUZE/BANDAGES/DRESSINGS) ×3 IMPLANT
SPONGE GAUZE 4X4 12PLY STER LF (GAUZE/BANDAGES/DRESSINGS) ×3 IMPLANT
STOCKINETTE 4X48 STRL (DRAPES) ×3 IMPLANT
STRIP CLOSURE SKIN 1/2X4 (GAUZE/BANDAGES/DRESSINGS) ×3 IMPLANT
SUT PROLENE 3 0 PS 2 (SUTURE) ×3 IMPLANT
SUT VIC AB 3-0 SH 27 (SUTURE)
SUT VIC AB 3-0 SH 27XBRD (SUTURE) IMPLANT
SUT VIC AB 4-0 P-3 18XBRD (SUTURE) IMPLANT
SUT VIC AB 4-0 P3 18 (SUTURE) ×3
SYR 3ML 23GX1 SAFETY (SYRINGE) IMPLANT
SYR BULB 3OZ (MISCELLANEOUS) IMPLANT
SYR CONTROL 10ML LL (SYRINGE) ×1 IMPLANT
TOWEL OR 17X24 6PK STRL BLUE (TOWEL DISPOSABLE) ×6 IMPLANT
TRAY DSU PREP LF (CUSTOM PROCEDURE TRAY) ×3 IMPLANT
UNDERPAD 30X30 INCONTINENT (UNDERPADS AND DIAPERS) ×3 IMPLANT

## 2013-05-17 NOTE — Anesthesia Procedure Notes (Signed)
Procedure Name: LMA Insertion Date/Time: 05/17/2013 10:36 AM Performed by: Anthony Castillo, Anthony Hadden W Pre-anesthesia Checklist: Patient identified, Emergency Drugs available, Suction available and Patient being monitored Patient Re-evaluated:Patient Re-evaluated prior to inductionOxygen Delivery Method: Circle System Utilized Preoxygenation: Pre-oxygenation with 100% oxygen Intubation Type: IV induction Ventilation: Mask ventilation without difficulty LMA: LMA with gastric port inserted LMA Size: 5.0 Number of attempts: 1 Airway Equipment and Method: bite block Placement Confirmation: positive ETCO2 Tube secured with: Tape Dental Injury: Teeth and Oropharynx as per pre-operative assessment

## 2013-05-17 NOTE — Anesthesia Postprocedure Evaluation (Signed)
Anesthesia Post Note  Patient: Anthony Castillo  Procedure(s) Performed: Procedure(s) (LRB): RIGHT ULNAR NERVE DECOMPRESSION (Right) RIGHT CARPAL TUNNEL RELEASE (Right)  Anesthesia type: General  Patient location: PACU  Post pain: Pain level controlled  Post assessment: Patient's Cardiovascular Status Stable  Last Vitals:  Filed Vitals:   05/17/13 1202  BP: 157/78  Pulse: 77  Temp:   Resp: 14    Post vital signs: Reviewed and stable  Level of consciousness: alert  Complications: No apparent anesthesia complications

## 2013-05-17 NOTE — Transfer of Care (Signed)
Immediate Anesthesia Transfer of Care Note  Patient: Anthony Castillo  Procedure(s) Performed: Procedure(s): RIGHT ULNAR NERVE DECOMPRESSION (Right) RIGHT CARPAL TUNNEL RELEASE (Right)  Patient Location: PACU  Anesthesia Type:General  Level of Consciousness: awake and alert   Airway & Oxygen Therapy: Patient Spontanous Breathing  Post-op Assessment: Report given to PACU RN and Post -op Vital signs reviewed and stable  Post vital signs: Reviewed and stable  Complications: No apparent anesthesia complications

## 2013-05-17 NOTE — Discharge Instructions (Addendum)

## 2013-05-17 NOTE — Op Note (Signed)
[id number] 

## 2013-05-17 NOTE — Anesthesia Preprocedure Evaluation (Signed)
Anesthesia Evaluation  Patient identified by MRN, date of birth, ID band Patient awake    Reviewed: Allergy & Precautions, H&P , NPO status , Patient's Chart, lab work & pertinent test results, reviewed documented beta blocker date and time   Airway Mallampati: II TM Distance: >3 FB Neck ROM: full    Dental   Pulmonary neg pulmonary ROS, sleep apnea and Continuous Positive Airway Pressure Ventilation ,  breath sounds clear to auscultation        Cardiovascular hypertension, On Medications Rhythm:regular     Neuro/Psych PSYCHIATRIC DISORDERS negative neurological ROS     GI/Hepatic Neg liver ROS, GERD-  Medicated and Controlled,  Endo/Other  Morbid obesity  Renal/GU negative Renal ROS  negative genitourinary   Musculoskeletal   Abdominal   Peds  Hematology negative hematology ROS (+)   Anesthesia Other Findings See surgeon's H&P   Reproductive/Obstetrics negative OB ROS                           Anesthesia Physical Anesthesia Plan  ASA: III  Anesthesia Plan: General   Post-op Pain Management:    Induction: Intravenous  Airway Management Planned: LMA  Additional Equipment:   Intra-op Plan:   Post-operative Plan:   Informed Consent: I have reviewed the patients History and Physical, chart, labs and discussed the procedure including the risks, benefits and alternatives for the proposed anesthesia with the patient or authorized representative who has indicated his/her understanding and acceptance.   Dental Advisory Given  Plan Discussed with: CRNA and Surgeon  Anesthesia Plan Comments:         Anesthesia Quick Evaluation

## 2013-05-17 NOTE — Brief Op Note (Signed)
05/17/2013  11:47 AM  PATIENT:  Anthony Castillo  48 y.o. male  PRE-OPERATIVE DIAGNOSIS:  RIGHT ULNAR ENTRAPMENT AT ELBOW/SEVERE BILATERAL CARPAL TUNNEL SYNDROME  POST-OPERATIVE DIAGNOSIS:  Right Carpal Tunnel syndrome and Ulnar nerve Entrapment At Elbow  PROCEDURE:  Procedure(s): RIGHT ULNAR NERVE DECOMPRESSION (Right) RIGHT CARPAL TUNNEL RELEASE (Right)  SURGEON:  Surgeon(s) and Role:    * Wyn Forsterobert V Chi Woodham Jr., MD - Primary  PHYSICIAN ASSISTANT:   ASSISTANTS: surgical tech  ANESTHESIA:   general  EBL:  Total I/O In: 1200 [I.V.:1200] Out: -   BLOOD ADMINISTERED:none  DRAINS: none   LOCAL MEDICATIONS USED:  XYLOCAINE   SPECIMEN:  No Specimen  DISPOSITION OF SPECIMEN:  N/A  COUNTS:  YES  TOURNIQUET:   Total Tourniquet Time Documented: Upper Arm (Right) - 29 minutes Total: Upper Arm (Right) - 29 minutes   DICTATION: .Other Dictation: Dictation Number [id number]  PLAN OF CARE: Discharge to home after PACU  PATIENT DISPOSITION:  PACU - hemodynamically stable.   Delay start of Pharmacological VTE agent (>24hrs) due to surgical blood loss or risk of bleeding: not applicable

## 2013-05-18 ENCOUNTER — Encounter (HOSPITAL_BASED_OUTPATIENT_CLINIC_OR_DEPARTMENT_OTHER): Payer: Self-pay | Admitting: Orthopedic Surgery

## 2013-05-18 NOTE — Op Note (Signed)
NAMDoristine Section:  Anthony Castillo, Anthony Castillo               ACCOUNT NO.:  [id number]  MEDICAL RECORD NO.:  [id number]  LOCATION:                                 FACILITY:  PHYSICIAN:  Katy Fitchobert V. Shakai Dolley, M.D.      DATE OF BIRTH:  DATE OF PROCEDURE:  05/17/2013 DATE OF DISCHARGE:                              OPERATIVE REPORT   PREOPERATIVE DIAGNOSES: 1. Severe right carpal  tunnel syndrome. 2. 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  POSTOPERATIVE DIAGNOSES: 1. Severe right carpal tunnel syndrome. 2. 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  OPERATION: 1. Release of right transverse carpal ligament. 2. In situ decompression of right ulnar nerve with release of right     flexor carpi ulnaris fascia and brachial fascia/arcuate ligament.  OPERATING SURGEON:  Katy Fitchobert V. Alletta Mattos, MD.  ASSISTANT:  Surgical technician.  ANESTHESIA:  General by LMA.  SUPERVISING ANESTHESIOLOGIST:  Dr. Gypsy BalsamKasik.  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  He was noted to have clinical evidence of severe bilateral carpal tunnel syndrome and probable right ulnar entrapment neuropathy at the elbow.  Diagnostic studies confirmed very severe bilateral carpal tunnel syndrome and moderate right ulnar entrapment neuropathy at cubital tunnel.  Anthony Castillo is a very large gentleman.  He also works extremely hard with his hands and has thick calluses.  His electrodiagnostic studies revealed profound slowing of his median motor and sensory conduction across the right and left wrist.  Preoperatively, he was reminded of the potential risks and benefits of surgery.  He was cautioned not to get his hands wet or dirty in the immediate postoperative period.  His suture will be removed from his palm in 8 days and from his  elbow in 14 days.  Questions regarding the anticipated procedure, aftercare, potential risks and benefits were explained in detail in the office and in the holding area.  His right arm and hand were marked with a marking pen per protocol as the proper surgical site.  Preoperatively, he was interviewed by Dr. Gypsy BalsamKasik of Anesthesia.  General anesthesia by LMA technique was recommended and accepted.  DESCRIPTION OF PROCEDURE:  Anthony Castillo [facility] and placed in supine position on the operating table.  Under Dr. Burnett CorrenteKasik's direct supervision, general anesthesia by LMA technique was induced followed by routine Betadine scrub and paint of the right upper extremity.  A pneumatic tourniquet was applied to the proximal right brachium.  Following exsanguination of the right arm with Esmarch bandage, arterial tourniquet was inflated to 220 mmHg.  A routine surgical time-out was accomplished.  Procedure commenced with a 3 cm incision paralleling the thenar crease and the proximal right palm.  Subcutaneous tissues were carefully divided revealing the palmar fascia.  This was split longitudinally to reveal the common sensory branches of the median nerve and superficial palmar arch.  The distal margin of the transverse carpal ligament was identified and a Penfield-4 elevator used to sound the canal.  The transverse carpal ligament was sequentially released along its ulnar border extending into the distal forearm.  The volar forearm fascia was challenging to see due to quite a bit of adipose tissue present; therefore, the incision was extended to the distal wrist flexion crease, allowing safe visualization of the distal forearm fascia.  The fascia was released 5 cm above the distal wrist flexion crease.  The wound was inspected for bleeding points which were electrocauterized with bipolar cautery and saline.  The wound was then repaired with segmental  intradermal 3-0 Prolene suture.  A 2% lidocaine was infiltrated along the wound margins for postoperative comfort.  Steri-Strips applied followed by sterile gauze dressing.  Attention was then directed to the right elbow.  The medial epicondyle was palpated and incision planned just posterior to the epicondyle.  The skin incisions was taken sharply and subcutaneous tissues carefully divided taking care to identify and electrocauterize transverse veins.  The posterior branches of the medial antebrachial cutaneous nerve were identified and gently protected with blunt retractors.  The ulnar nerve was identified by palpation.  The fascia overlying the ulnar nerve of the brachium was released 6 cm above the epicondyle, taking care to avoid all vascular structures.  The arcuate ligament was released posteriorly and the fascia at the heads of the flexor carpi ulnaris was released and the muscle fascia released  6 cm distal to the epicondyle.  A Freer elevator was used to palpate along the nerve. No other masses or fascial predicaments were noted.  Care was taken to meticulously identify all blood vessels and electrocauterize them with bipolar forceps and saline.  The elbow was then ranged 0-130 degrees flexion.  The nerve was noted to be stable.  There did not appear to be any indications for transposition.  The wound was then inspected with the tourniquet released.  Pressure was held on the wound for two minutes.  There was no significant bleeding noted except for the dermal margins which were electrocauterized.  The wound was then repaired in layers with subcutaneous 4-0 Vicryl and intradermal 3-0 Prolene and Steri-Strips.  The elbow was dressed with sterile gauze and Tegaderm followed by Ace wrap.  The wrist was immobilized in a volar plaster splint with Coban maintaining the wrist in 15 degrees dorsiflexion.     Katy Fitch Sheronda Parran, M.D.     RVS/MEDQ  D:  05/17/2013  T:   05/17/2013  Job:  [id number]

## 2013-07-16 ENCOUNTER — Encounter (HOSPITAL_BASED_OUTPATIENT_CLINIC_OR_DEPARTMENT_OTHER): Payer: Self-pay | Admitting: *Deleted

## 2013-07-16 NOTE — Progress Notes (Signed)
Pt here 3/15 for rt ctr-ulnar nerve-did well-will need istat-bring all meds ansd cpap

## 2013-07-17 ENCOUNTER — Other Ambulatory Visit: Payer: Self-pay | Admitting: Orthopedic Surgery

## 2013-07-18 NOTE — H&P (Signed)
Anthony Castillo is an 48 y.o. male.   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 He presents for evaluation of numbness in his right and left hands, worse on the right than left. He has noted significant weakness of grip. He was diagnosed to have type II diabetes approximately one year ago. His last hemoglobin A1C was less than 6. He has no history of fracture or other trauma to his hands or wrists. He has had no neck injuries. He is awakening at night with numbness bilaterally.    Past Medical History  Diagnosis Date  . Osteoarthritis 04/09/2011  . Morbid obesity 09/22/2006  . Hypogonadism male 04/09/2011  . DEPRESSION 03/16/2007  . HYPERTENSION 09/22/2006  . Chronic pain 04/09/2011  . Wears glasses   . Sleep apnea     uses a cpap-will use after surgery  . GERD (gastroesophageal reflux disease)     Past Surgical History  Procedure Laterality Date  . Tonsillectomy    . Wisdom tooth extraction    . Knee arthroscopy  6/14    right  . Ulnar nerve transposition Right 05/17/2013    Procedure: RIGHT ULNAR NERVE DECOMPRESSION;  Surgeon: Anthony Forsterobert V Unnamed Hino Castillo., MD;  Location: [facility];  Service: Orthopedics;  Laterality: Right;  . Carpal tunnel release Right 05/17/2013    Procedure: RIGHT CARPAL TUNNEL RELEASE;  Surgeon: Anthony Forsterobert V Jaima Janney Castillo., MD;  Location: [facility];  Service: Orthopedics;  Laterality: Right;    History reviewed. No pertinent family history. Social History:  reports that he has never smoked. His smokeless tobacco use includes Chew. He reports that he drinks alcohol. He reports that he does not use illicit drugs.  Allergies:  Allergies  Allergen Reactions  . Penicillins     REACTION: unspecified    No prescriptions prior to admission    No results found for this or any previous visit (from the past 48 hour(s)).  No results  found.   Pertinent items are noted in HPI.  Height 6\' 1"  (1.854 m), weight 151.955 kg (335 lb).  General appearance: alert Head: Normocephalic, without obvious abnormality Neck: supple, symmetrical, trachea midline Resp: clear to auscultation bilaterally Cardio: regular rate and rhythm GI: normal findings: bowel sounds normal Extremities: Inspection of his hands reveals thick calluses and signs of hard work. He has full ROM of his fingers in flexion/extension. His pulses and capillary refill are intact. He has a positive wrist flexion test bilaterally. He has no signs of stenosing tenosynovitis of the thumbs, fingers or first dorsal compartments. He has some prominent of his right elbow suggesting either an osteophyte at the olecranon or chronic bursitis of the olecranon bursa.  X-rays of his right elbow AP lateral demonstrate chronic epicondylitis bilaterally with reactive bone formation. His humeral ulnar joint is well preserved. The radiocapitellar joint is well preserved.  His hand films demonstrate an ossicle at the radial critical corner at the base of his proximal phalanx at the MP joint consistent with prior trauma.  Dr. Johna RolesPelligra completed electrodiagnostic studies. 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  Pulses: 2+ and symmetric Skin: normal Neurologic: Grossly normal   Assessment/Plan Impression: Left CTS  Plan: To the OR for left CTR.The procedure, risks,benefits and post-op course were discussed with the patient at length and they were in agreement with the  plan.  Anthony Castillo 07/18/2013, 4:04 PM   H&P documentation: 07/19/2013  -History and Physical Reviewed  -Patient has been re-examined  -No change in the plan of care  Anthony Forsterobert V Alicia Seib Jr, MD

## 2013-07-19 ENCOUNTER — Encounter (HOSPITAL_BASED_OUTPATIENT_CLINIC_OR_DEPARTMENT_OTHER): Payer: Self-pay | Admitting: Anesthesiology

## 2013-07-19 ENCOUNTER — Ambulatory Visit (HOSPITAL_BASED_OUTPATIENT_CLINIC_OR_DEPARTMENT_OTHER): Payer: BC Managed Care – PPO | Admitting: Anesthesiology

## 2013-07-19 ENCOUNTER — Encounter (HOSPITAL_BASED_OUTPATIENT_CLINIC_OR_DEPARTMENT_OTHER)
Admission: RE | Disposition: A | Discharge status: Home / Self Care | Payer: Self-pay | Source: Ambulatory Visit | Attending: Orthopedic Surgery

## 2013-07-19 ENCOUNTER — Ambulatory Visit (HOSPITAL_BASED_OUTPATIENT_CLINIC_OR_DEPARTMENT_OTHER)
Admission: RE | Admit: 2013-07-19 | Discharge: 2013-07-19 | Disposition: A | Discharge status: Home / Self Care | Payer: BC Managed Care – PPO | Source: Ambulatory Visit | Attending: Orthopedic Surgery | Admitting: Orthopedic Surgery

## 2013-07-19 ENCOUNTER — Encounter (HOSPITAL_BASED_OUTPATIENT_CLINIC_OR_DEPARTMENT_OTHER): Payer: BC Managed Care – PPO | Admitting: Anesthesiology

## 2013-07-19 DIAGNOSIS — F329 Major depressive disorder, single episode, unspecified: Secondary | ICD-10-CM | POA: Insufficient documentation

## 2013-07-19 DIAGNOSIS — E291 Testicular hypofunction: Secondary | ICD-10-CM | POA: Insufficient documentation

## 2013-07-19 DIAGNOSIS — Z880 Allergy status to penicillin: Secondary | ICD-10-CM | POA: Insufficient documentation

## 2013-07-19 DIAGNOSIS — G5600 Carpal tunnel syndrome, unspecified upper limb: Secondary | ICD-10-CM | POA: Insufficient documentation

## 2013-07-19 DIAGNOSIS — Z6841 Body Mass Index (BMI) 40.0 and over, adult: Secondary | ICD-10-CM | POA: Insufficient documentation

## 2013-07-19 DIAGNOSIS — M1990 Unspecified osteoarthritis, unspecified site: Secondary | ICD-10-CM | POA: Insufficient documentation

## 2013-07-19 DIAGNOSIS — F3289 Other specified depressive episodes: Secondary | ICD-10-CM | POA: Insufficient documentation

## 2013-07-19 DIAGNOSIS — I10 Essential (primary) hypertension: Secondary | ICD-10-CM | POA: Insufficient documentation

## 2013-07-19 DIAGNOSIS — G4730 Sleep apnea, unspecified: Secondary | ICD-10-CM | POA: Insufficient documentation

## 2013-07-19 DIAGNOSIS — K219 Gastro-esophageal reflux disease without esophagitis: Secondary | ICD-10-CM | POA: Insufficient documentation

## 2013-07-19 HISTORY — PX: CARPAL TUNNEL RELEASE: SHX101

## 2013-07-19 LAB — POCT I-STAT, CHEM 8
BUN: 15 mg/dL (ref 6–23)
Calcium, Ion: 1.16 mmol/L (ref 1.12–1.23)
Chloride: 102 mEq/L (ref 96–112)
Creatinine, Ser: 1 mg/dL (ref 0.50–1.35)
Glucose, Bld: 113 mg/dL — ABNORMAL HIGH (ref 70–99)
HCT: 39 % (ref 39.0–52.0)
Hemoglobin: 13.3 g/dL (ref 13.0–17.0)
Potassium: 3.8 mEq/L (ref 3.7–5.3)
Sodium: 141 mEq/L (ref 137–147)
TCO2: 25 mmol/L (ref 0–100)

## 2013-07-19 LAB — GLUCOSE, CAPILLARY: Glucose-Capillary: 140 mg/dL — ABNORMAL HIGH (ref 70–99)

## 2013-07-19 SURGERY — CARPAL TUNNEL RELEASE
Anesthesia: General | Site: Wrist | Laterality: Left

## 2013-07-19 MED ORDER — LACTATED RINGERS IV SOLN
INTRAVENOUS | Status: DC
  Administered 2013-07-19 (×2): via INTRAVENOUS

## 2013-07-19 MED ORDER — MIDAZOLAM HCL 2 MG/2ML IJ SOLN
INTRAMUSCULAR | Status: AC
  Filled 2013-07-19: qty 2

## 2013-07-19 MED ORDER — FENTANYL CITRATE 0.05 MG/ML IJ SOLN: 50.0000 ug | INTRAMUSCULAR | Status: DC | PRN

## 2013-07-19 MED ORDER — HYDROMORPHONE HCL PF 1 MG/ML IJ SOLN: 0.2500 mg | INTRAMUSCULAR | Status: DC | PRN

## 2013-07-19 MED ORDER — PROPOFOL 10 MG/ML IV BOLUS
INTRAVENOUS | Status: DC | PRN
  Administered 2013-07-19: 200 mg via INTRAVENOUS

## 2013-07-19 MED ORDER — LIDOCAINE HCL 2 % IJ SOLN
INTRAMUSCULAR | Status: DC | PRN
  Administered 2013-07-19: 4 mL

## 2013-07-19 MED ORDER — CHLORHEXIDINE GLUCONATE 4 % EX LIQD: 60.0000 mL | Freq: Once | CUTANEOUS | Status: DC

## 2013-07-19 MED ORDER — OXYCODONE HCL 5 MG PO TABS: 5.0000 mg | ORAL_TABLET | Freq: Once | ORAL | Status: DC | PRN

## 2013-07-19 MED ORDER — ONDANSETRON HCL 4 MG/2ML IJ SOLN: 4.0000 mg | Freq: Once | INTRAMUSCULAR | Status: DC | PRN

## 2013-07-19 MED ORDER — MIDAZOLAM HCL 5 MG/5ML IJ SOLN
INTRAMUSCULAR | Status: DC | PRN
  Administered 2013-07-19: 2 mg via INTRAVENOUS

## 2013-07-19 MED ORDER — OXYCODONE-ACETAMINOPHEN 5-325 MG PO TABS: ORAL_TABLET | ORAL | Status: DC

## 2013-07-19 MED ORDER — ONDANSETRON HCL 4 MG/2ML IJ SOLN
INTRAMUSCULAR | Status: DC | PRN
  Administered 2013-07-19: 4 mg via INTRAVENOUS

## 2013-07-19 MED ORDER — OXYCODONE HCL 5 MG/5ML PO SOLN: 5.0000 mg | Freq: Once | ORAL | Status: DC | PRN

## 2013-07-19 MED ORDER — DEXAMETHASONE SODIUM PHOSPHATE 10 MG/ML IJ SOLN
INTRAMUSCULAR | Status: DC | PRN
  Administered 2013-07-19: 10 mg via INTRAVENOUS

## 2013-07-19 MED ORDER — LIDOCAINE HCL (CARDIAC) 20 MG/ML IV SOLN
INTRAVENOUS | Status: DC | PRN
  Administered 2013-07-19: 30 mg via INTRAVENOUS

## 2013-07-19 MED ORDER — FENTANYL CITRATE 0.05 MG/ML IJ SOLN
INTRAMUSCULAR | Status: DC | PRN
  Administered 2013-07-19 (×2): 50 ug via INTRAVENOUS

## 2013-07-19 MED ORDER — FENTANYL CITRATE 0.05 MG/ML IJ SOLN
INTRAMUSCULAR | Status: AC
  Filled 2013-07-19: qty 2

## 2013-07-19 MED ORDER — MIDAZOLAM HCL 2 MG/2ML IJ SOLN: 1.0000 mg | INTRAMUSCULAR | Status: DC | PRN

## 2013-07-19 MED ORDER — MEPERIDINE HCL 25 MG/ML IJ SOLN: 6.2500 mg | INTRAMUSCULAR | Status: DC | PRN

## 2013-07-19 SURGICAL SUPPLY — 42 items
BANDAGE ADH SHEER 1  50/CT (GAUZE/BANDAGES/DRESSINGS) IMPLANT
BANDAGE ELASTIC 3 VELCRO ST LF (GAUZE/BANDAGES/DRESSINGS) ×1 IMPLANT
BLADE 15 SAFETY STRL DISP (BLADE) ×1 IMPLANT
BLADE SURG 15 STRL LF DISP TIS (BLADE) IMPLANT
BLADE SURG 15 STRL SS (BLADE) ×2
BNDG CMPR 9X4 STRL LF SNTH (GAUZE/BANDAGES/DRESSINGS) ×1
BNDG COHESIVE 3X5 TAN STRL LF (GAUZE/BANDAGES/DRESSINGS) ×1 IMPLANT
BNDG ESMARK 4X9 LF (GAUZE/BANDAGES/DRESSINGS) ×1 IMPLANT
BRUSH SCRUB EZ PLAIN DRY (MISCELLANEOUS) ×2 IMPLANT
CORDS BIPOLAR (ELECTRODE) ×1 IMPLANT
COVER MAYO STAND STRL (DRAPES) ×2 IMPLANT
COVER TABLE BACK 60X90 (DRAPES) ×2 IMPLANT
CUFF TOURNIQUET SINGLE 18IN (TOURNIQUET CUFF) IMPLANT
CUFF TOURNIQUET SINGLE 24IN (TOURNIQUET CUFF) ×1 IMPLANT
DECANTER SPIKE VIAL GLASS SM (MISCELLANEOUS) ×1 IMPLANT
DRAPE EXTREMITY T 121X128X90 (DRAPE) ×2 IMPLANT
DRAPE SURG 17X23 STRL (DRAPES) ×2 IMPLANT
GAUZE SPONGE 4X4 12PLY STRL (GAUZE/BANDAGES/DRESSINGS) ×2 IMPLANT
GLOVE BIO SURGEON STRL SZ7.5 (GLOVE) ×1 IMPLANT
GLOVE BIOGEL M STRL SZ7.5 (GLOVE) ×1 IMPLANT
GLOVE ORTHO TXT STRL SZ7.5 (GLOVE) ×2 IMPLANT
GLOVE SURG SS PI 7.5 STRL IVOR (GLOVE) ×1 IMPLANT
GOWN STRL REUS W/ TWL LRG LVL3 (GOWN DISPOSABLE) ×1 IMPLANT
GOWN STRL REUS W/ TWL XL LVL3 (GOWN DISPOSABLE) ×2 IMPLANT
GOWN STRL REUS W/TWL LRG LVL3 (GOWN DISPOSABLE) ×2
GOWN STRL REUS W/TWL XL LVL3 (GOWN DISPOSABLE) ×2
NEEDLE 27GAX1X1/2 (NEEDLE) ×1 IMPLANT
PACK BASIN DAY SURGERY FS (CUSTOM PROCEDURE TRAY) ×2 IMPLANT
PAD CAST 3X4 CTTN HI CHSV (CAST SUPPLIES) ×1 IMPLANT
PADDING CAST ABS 4INX4YD NS (CAST SUPPLIES)
PADDING CAST ABS COTTON 4X4 ST (CAST SUPPLIES) ×1 IMPLANT
PADDING CAST COTTON 3X4 STRL (CAST SUPPLIES) ×2
SPLINT PLASTER CAST XFAST 3X15 (CAST SUPPLIES) ×5 IMPLANT
SPLINT PLASTER XTRA FASTSET 3X (CAST SUPPLIES) ×5
STOCKINETTE 4X48 STRL (DRAPES) ×2 IMPLANT
STRIP CLOSURE SKIN 1/2X4 (GAUZE/BANDAGES/DRESSINGS) ×2 IMPLANT
SUT PROLENE 3 0 PS 2 (SUTURE) ×2 IMPLANT
SYR 3ML 23GX1 SAFETY (SYRINGE) IMPLANT
SYR CONTROL 10ML LL (SYRINGE) ×1 IMPLANT
TOWEL OR 17X24 6PK STRL BLUE (TOWEL DISPOSABLE) ×2 IMPLANT
TRAY DSU PREP LF (CUSTOM PROCEDURE TRAY) ×2 IMPLANT
UNDERPAD 30X30 INCONTINENT (UNDERPADS AND DIAPERS) ×2 IMPLANT

## 2013-07-19 NOTE — Op Note (Signed)
[id number] 

## 2013-07-19 NOTE — Transfer of Care (Signed)
Immediate Anesthesia Transfer of Care Note  Patient: Anthony Castillo  Procedure(s) Performed: Procedure(s): LEFT CARPAL TUNNEL RELEASE (Left)  Patient Location: PACU  Anesthesia Type:General  Level of Consciousness: sedated and patient cooperative  Airway & Oxygen Therapy: Patient Spontanous Breathing and Patient connected to face mask oxygen  Post-op Assessment: Report given to PACU RN and Post -op Vital signs reviewed and stable  Post vital signs: Reviewed and stable  Complications: No apparent anesthesia complications

## 2013-07-19 NOTE — Discharge Instructions (Addendum)

## 2013-07-19 NOTE — Anesthesia Procedure Notes (Signed)
Procedure Name: LMA Insertion Date/Time: 07/19/2013 8:36 AM Performed by: Genevieve NorlanderLINKA, Guyla Bless L Pre-anesthesia Checklist: Patient identified, Emergency Drugs available, Suction available, Patient being monitored and Timeout performed Patient Re-evaluated:Patient Re-evaluated prior to inductionOxygen Delivery Method: Circle System Utilized Preoxygenation: Pre-oxygenation with 100% oxygen Intubation Type: IV induction Ventilation: Mask ventilation without difficulty LMA: LMA inserted LMA Size: 5.0 Number of attempts: 1 Airway Equipment and Method: bite block Placement Confirmation: positive ETCO2 and breath sounds checked- equal and bilateral Tube secured with: Tape Dental Injury: Teeth and Oropharynx as per pre-operative assessment

## 2013-07-19 NOTE — Anesthesia Postprocedure Evaluation (Signed)
Anesthesia Post Note  Patient: Anthony Castillo  Procedure(s) Performed: Procedure(s) (LRB): LEFT CARPAL TUNNEL RELEASE (Left)  Anesthesia type: general  Patient location: PACU  Post pain: Pain level controlled  Post assessment: Patient's Cardiovascular Status Stable  Last Vitals:  Filed Vitals:   07/19/13 1121  BP: 152/78  Pulse:   Temp: 36.5 C  Resp: 18    Post vital signs: Reviewed and stable  Level of consciousness: sedated  Complications: No apparent anesthesia complications

## 2013-07-19 NOTE — Op Note (Signed)
NAMDoristine Section:  Odonell, Shinichi               ACCOUNT NO.:  [id number]  MEDICAL RECORD NO.:  [id number]  LOCATION:                                 FACILITY:  PHYSICIAN:  Katy Fitchobert V. Matteson Blue, M.D.      DATE OF BIRTH:  DATE OF PROCEDURE:  07/19/2013 DATE OF DISCHARGE:                              OPERATIVE REPORT   PREOPERATIVE DIAGNOSIS:  Severe left carpal tunnel syndrome.  POSTOPERATIVE DIAGNOSIS:  Severe left carpal tunnel syndrome.  OPERATION:  Release of left transverse carpal ligament.  OPERATING SURGEON:  Katy Fitchobert V. Sargun Rummell, MD  ASSISTANT:  Surgical technician.  ANESTHESIA:  General by LMA.  SUPERVISING ANESTHESIOLOGIST:  Kaylyn LayerKevin D. 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  He is status post decompression of his right carpal tunnel and in situ decompression of his left ulnar nerve at the cubital tunnel on May 17, 2013.  Mr. Benjiman CoreHinton has gone on to recover improve sensibility on the right side. Due to the severe nature of his carpal tunnel syndrome, he will need to wait at least 5 months to see the full benefit of his right side surgery.  He now returns for similar left carpal tunnel release surgery. He understands once again that it will take 5 months to see the full benefit  of surgery due the fact that his preoperative electrodiagnostic studies showed severe neuropathy.  After detailed informed consent, he was brought to the operating room at this time.  PROCEDURE:  Micheline MazeGeorge Maggio was interviewed in the holding area and his left hand marked per protocol with a marking pen as the proper surgical site.  He was interviewed by Dr. Michelle Piperssey and advised to undergo general anesthesia by LMA technique.  He was then transferred to 6 of [facility] where under Dr. Deirdre Priestssey's direct  supervision, general anesthesia by LMA technique was induced.  The left hand and arm were then prepped with Betadine soap and solution, sterilely draped.  A pneumatic tourniquet was set at 250 mmHg due to mild systolic hypertension.  Following a routine surgical time-out, the left hand and arm were exsanguinated with Esmarch bandage and arterial tourniquet inflated to 250 mmHg.  Procedure commenced with a 3-cm incision in the line of the ring finger in the proximal palm.  Subcutaneous tissues were carefully divided revealing the palmar fascia.  This was split longitudinally to reveal  the common sensory branches of the median nerve and superficial palmar arch. As we dissected the subcutaneous tissues,  I identified a large nerve branch crossing from the median nerve proper to the hypothenar muscles.  This was unusual and while it could represent a variant of a sensory branch, it  could also represent an aberrant motor branch.  This was carefully protected throughout the entire dissection.  We identified the transverse carpal ligament and sequentially released it approximately to level of the distal wrist flexion crease.  I then released the volar forearm fascia and the remnants of the transverse carpal ligament in the distal forearm.  This widely opened the carpal canal. Mr. Tavis's nerve was noted to be quite erythematous and the tenosynovium of the ulnar bursa fibrotic.  Bleeding points were electrocauterized with bipolar current followed by repair of the skin with segmental intradermal 3-0 Prolene and Steri- Strips.  For aftercare, Mr. Benjiman CoreHinton was placed in a voluminous gauze dressing with sterile Webril, followed by a plaster splint and Coban maintaining his wrist in 15 degrees of dorsiflexion.  For aftercare, he is provided prescription for Percocet 5 mg 1 or 2 tablets p.o. q.4-6 hours p.r.n. pain, 30 tablets without refill.  We will see him back in followup in our office in  a week for dressing change and suture removal.     Katy Fitchobert V. Alecxis Baltzell, M.D.     RVS/MEDQ  D:  07/19/2013  T:  07/19/2013  Job:  [id number]  cc:   Tomi BambergerSusan Fuller, NP

## 2013-07-19 NOTE — Brief Op Note (Signed)
[id number]/21/2015  9:11 AM  PATIENT:  Anthony Castillo  48 y.o. male  PRE-OPERATIVE DIAGNOSIS:  left carpal tunnel syndrome  POST-OPERATIVE DIAGNOSIS:  left carpal tunnel syndrome  PROCEDURE:  Procedure(s): LEFT CARPAL TUNNEL RELEASE (Left)  SURGEON:  Surgeon(s) and Role:    * Wyn Forsterobert V Aslin Farinas Jr., MD - Primary  PHYSICIAN ASSISTANT:   ASSISTANTS:  Surgical tech  ANESTHESIA:   none  EBL:  Total I/O In: 1000 [I.V.:1000] Out: -   BLOOD ADMINISTERED:none  DRAINS: none   LOCAL MEDICATIONS USED:  LIDOCAINE   SPECIMEN:  No Specimen  DISPOSITION OF SPECIMEN:  N/A  COUNTS:  YES  TOURNIQUET:   Total Tourniquet Time Documented: Upper Arm (Left) - 17 minutes Total: Upper Arm (Left) - 17 minutes   DICTATION: .Other Dictation: Dictation Number [id number]  PLAN OF CARE: Discharge to home after PACU  PATIENT DISPOSITION:  PACU - hemodynamically stable.   Delay start of Pharmacological VTE agent (>24hrs) due to surgical blood loss or risk of bleeding: not applicable

## 2013-07-19 NOTE — Anesthesia Preprocedure Evaluation (Signed)
Anesthesia Evaluation  Patient identified by MRN, date of birth, ID band Patient awake    Reviewed: Allergy & Precautions, H&P , NPO status , Patient's Chart, lab work & pertinent test results  Airway Mallampati: I TM Distance: >3 FB Neck ROM: Full    Dental   Pulmonary          Cardiovascular hypertension, Pt. on medications     Neuro/Psych    GI/Hepatic GERD-  Medicated and Controlled,  Endo/Other    Renal/GU      Musculoskeletal   Abdominal   Peds  Hematology   Anesthesia Other Findings   Reproductive/Obstetrics                           Anesthesia Physical Anesthesia Plan  ASA: II  Anesthesia Plan: General   Post-op Pain Management:    Induction: Intravenous  Airway Management Planned: LMA  Additional Equipment:   Intra-op Plan:   Post-operative Plan: Extubation in OR  Informed Consent: I have reviewed the patients History and Physical, chart, labs and discussed the procedure including the risks, benefits and alternatives for the proposed anesthesia with the patient or authorized representative who has indicated his/her understanding and acceptance.     Plan Discussed with: CRNA and Surgeon  Anesthesia Plan Comments:         Anesthesia Quick Evaluation  

## 2013-07-20 ENCOUNTER — Encounter (HOSPITAL_BASED_OUTPATIENT_CLINIC_OR_DEPARTMENT_OTHER): Payer: Self-pay | Admitting: Orthopedic Surgery

## 2014-04-17 ENCOUNTER — Ambulatory Visit (HOSPITAL_COMMUNITY)
Admission: RE | Admit: 2014-04-17 | Discharge: 2014-04-17 | Disposition: A | Discharge status: Home / Self Care | Payer: BLUE CROSS/BLUE SHIELD | Source: Ambulatory Visit | Attending: Orthopedic Surgery | Admitting: Orthopedic Surgery

## 2014-04-17 ENCOUNTER — Other Ambulatory Visit (HOSPITAL_COMMUNITY): Payer: Self-pay | Admitting: Orthopedic Surgery

## 2014-04-17 DIAGNOSIS — M25572 Pain in left ankle and joints of left foot: Secondary | ICD-10-CM

## 2014-04-17 DIAGNOSIS — Z0100 Encounter for examination of eyes and vision without abnormal findings: Secondary | ICD-10-CM | POA: Insufficient documentation

## 2015-01-30 HISTORY — PX: FOOT SURGERY: SHX648

## 2015-06-16 DIAGNOSIS — M7742 Metatarsalgia, left foot: Secondary | ICD-10-CM | POA: Diagnosis not present

## 2015-08-21 DIAGNOSIS — B86 Scabies: Secondary | ICD-10-CM | POA: Diagnosis not present

## 2015-08-26 DIAGNOSIS — L245 Irritant contact dermatitis due to other chemical products: Secondary | ICD-10-CM | POA: Diagnosis not present

## 2015-09-08 DIAGNOSIS — M2042 Other hammer toe(s) (acquired), left foot: Secondary | ICD-10-CM | POA: Diagnosis not present

## 2015-09-08 DIAGNOSIS — M7742 Metatarsalgia, left foot: Secondary | ICD-10-CM | POA: Diagnosis not present

## 2015-09-08 DIAGNOSIS — M7672 Peroneal tendinitis, left leg: Secondary | ICD-10-CM | POA: Diagnosis not present

## 2015-09-09 DIAGNOSIS — G4733 Obstructive sleep apnea (adult) (pediatric): Secondary | ICD-10-CM | POA: Diagnosis not present

## 2015-09-15 DIAGNOSIS — H6981 Other specified disorders of Eustachian tube, right ear: Secondary | ICD-10-CM | POA: Diagnosis not present

## 2015-09-15 DIAGNOSIS — J329 Chronic sinusitis, unspecified: Secondary | ICD-10-CM | POA: Diagnosis not present

## 2016-01-06 DIAGNOSIS — Z23 Encounter for immunization: Secondary | ICD-10-CM | POA: Diagnosis not present

## 2016-03-29 DIAGNOSIS — G5762 Lesion of plantar nerve, left lower limb: Secondary | ICD-10-CM | POA: Diagnosis not present

## 2016-03-29 DIAGNOSIS — M79672 Pain in left foot: Secondary | ICD-10-CM | POA: Diagnosis not present

## 2016-04-14 DIAGNOSIS — E119 Type 2 diabetes mellitus without complications: Secondary | ICD-10-CM | POA: Diagnosis not present

## 2016-04-14 DIAGNOSIS — H21301 Idiopathic cysts of iris, ciliary body or anterior chamber, right eye: Secondary | ICD-10-CM | POA: Diagnosis not present

## 2016-04-14 LAB — HM DIABETES EYE EXAM

## 2016-04-22 ENCOUNTER — Ambulatory Visit: Payer: BLUE CROSS/BLUE SHIELD | Admitting: Internal Medicine

## 2016-04-29 ENCOUNTER — Encounter: Payer: Self-pay | Admitting: Internal Medicine

## 2016-04-29 ENCOUNTER — Ambulatory Visit (INDEPENDENT_AMBULATORY_CARE_PROVIDER_SITE_OTHER): Payer: BLUE CROSS/BLUE SHIELD | Admitting: Internal Medicine

## 2016-04-29 VITALS — BP 138/76 | HR 81 | Temp 98.1°F | Ht 73.0 in | Wt 351.5 lb

## 2016-04-29 DIAGNOSIS — Z23 Encounter for immunization: Secondary | ICD-10-CM

## 2016-04-29 DIAGNOSIS — G4733 Obstructive sleep apnea (adult) (pediatric): Secondary | ICD-10-CM

## 2016-04-29 DIAGNOSIS — M150 Primary generalized (osteo)arthritis: Secondary | ICD-10-CM

## 2016-04-29 DIAGNOSIS — E7800 Pure hypercholesterolemia, unspecified: Secondary | ICD-10-CM | POA: Diagnosis not present

## 2016-04-29 DIAGNOSIS — F324 Major depressive disorder, single episode, in partial remission: Secondary | ICD-10-CM | POA: Diagnosis not present

## 2016-04-29 DIAGNOSIS — I10 Essential (primary) hypertension: Secondary | ICD-10-CM | POA: Diagnosis not present

## 2016-04-29 DIAGNOSIS — R5383 Other fatigue: Secondary | ICD-10-CM

## 2016-04-29 DIAGNOSIS — E119 Type 2 diabetes mellitus without complications: Secondary | ICD-10-CM | POA: Insufficient documentation

## 2016-04-29 DIAGNOSIS — M159 Polyosteoarthritis, unspecified: Secondary | ICD-10-CM

## 2016-04-29 DIAGNOSIS — J301 Allergic rhinitis due to pollen: Secondary | ICD-10-CM | POA: Diagnosis not present

## 2016-04-29 DIAGNOSIS — E291 Testicular hypofunction: Secondary | ICD-10-CM

## 2016-04-29 DIAGNOSIS — K219 Gastro-esophageal reflux disease without esophagitis: Secondary | ICD-10-CM | POA: Diagnosis not present

## 2016-04-29 DIAGNOSIS — J302 Other seasonal allergic rhinitis: Secondary | ICD-10-CM | POA: Insufficient documentation

## 2016-04-29 DIAGNOSIS — M8949 Other hypertrophic osteoarthropathy, multiple sites: Secondary | ICD-10-CM

## 2016-04-29 LAB — CBC
HCT: 39.8 % (ref 39.0–52.0)
Hemoglobin: 13.1 g/dL (ref 13.0–17.0)
MCHC: 32.8 g/dL (ref 30.0–36.0)
MCV: 81.5 fl (ref 78.0–100.0)
Platelets: 257 10*3/uL (ref 150.0–400.0)
RBC: 4.89 Mil/uL (ref 4.22–5.81)
RDW: 14.3 % (ref 11.5–15.5)
WBC: 5.9 10*3/uL (ref 4.0–10.5)

## 2016-04-29 LAB — COMPREHENSIVE METABOLIC PANEL
ALT: 69 U/L — ABNORMAL HIGH (ref 0–53)
AST: 50 U/L — ABNORMAL HIGH (ref 0–37)
Albumin: 4.3 g/dL (ref 3.5–5.2)
Alkaline Phosphatase: 82 U/L (ref 39–117)
BUN: 13 mg/dL (ref 6–23)
CO2: 29 mEq/L (ref 19–32)
Calcium: 9.4 mg/dL (ref 8.4–10.5)
Chloride: 101 mEq/L (ref 96–112)
Creatinine, Ser: 1.15 mg/dL (ref 0.40–1.50)
GFR: 70.8 mL/min (ref >60.00)
Glucose, Bld: 92 mg/dL (ref 70–99)
Potassium: 4.6 mEq/L (ref 3.5–5.1)
Sodium: 136 mEq/L (ref 135–145)
Total Bilirubin: 0.6 mg/dL (ref 0.2–1.2)
Total Protein: 6.9 g/dL (ref 6.0–8.3)

## 2016-04-29 LAB — LIPID PANEL
Cholesterol: 199 mg/dL (ref 0–200)
HDL: 33.7 mg/dL — ABNORMAL LOW (ref >39.00)
NonHDL: 165.73
Total CHOL/HDL Ratio: 6
Triglycerides: 316 mg/dL — ABNORMAL HIGH (ref 0.0–149.0)
VLDL: 63.2 mg/dL — ABNORMAL HIGH (ref 0.0–40.0)

## 2016-04-29 LAB — TSH: TSH: 1.31 u[IU]/mL (ref 0.35–4.50)

## 2016-04-29 LAB — LDL CHOLESTEROL, DIRECT: Direct LDL: 120 mg/dL

## 2016-04-29 LAB — VITAMIN B12: Vitamin B-12: 437 pg/mL (ref 211–911)

## 2016-04-29 LAB — HEMOGLOBIN A1C: Hgb A1c MFr Bld: 7.1 % — ABNORMAL HIGH (ref 4.6–6.5)

## 2016-04-29 LAB — VITAMIN D 25 HYDROXY (VIT D DEFICIENCY, FRACTURES): VITD: 34.26 ng/mL (ref 30.00–100.00)

## 2016-04-29 NOTE — Assessment & Plan Note (Signed)
He will continue Testosterone caps OTC Will check Testosterone level at next visit

## 2016-04-29 NOTE — Assessment & Plan Note (Signed)
Continue Zyrtec and Flonase prn.

## 2016-04-29 NOTE — Assessment & Plan Note (Signed)
Will try to wean the Protonix at some point Avoid triggers Will monitor

## 2016-04-29 NOTE — Assessment & Plan Note (Signed)
He is due for repeat sleep study Because he has ongoing fatigue, will check B12 and Vit D today Encouraged weight loss

## 2016-04-29 NOTE — Patient Instructions (Signed)
Carbohydrate Counting for Diabetes Mellitus, Adult Carbohydrate counting is a method for keeping track of how many carbohydrates you eat. Eating carbohydrates naturally increases the amount of sugar (glucose) in the blood. Counting how many carbohydrates you eat helps keep your blood glucose within normal limits, which helps you manage your diabetes (diabetes mellitus). It is important to know how many carbohydrates you can safely have in each meal. This is different for every person. A diet and nutrition specialist (registered dietitian) can help you make a meal plan and calculate how many carbohydrates you should have at each meal and snack. Carbohydrates are found in the following foods:  Grains, such as breads and cereals.  Dried beans and soy products.  Starchy vegetables, such as potatoes, peas, and corn.  Fruit and fruit juices.  Milk and yogurt.  Sweets and snack foods, such as cake, cookies, candy, chips, and soft drinks. How do I count carbohydrates? There are two ways to count carbohydrates in food. You can use either of the methods or a combination of both. Reading "Nutrition Facts" on packaged food  The "Nutrition Facts" list is included on the labels of almost all packaged foods and beverages in the [place] It includes:  The serving size.  Information about nutrients in each serving, including the grams (g) of carbohydrate per serving. To use the "Nutrition Facts":  Decide how many servings you will have.  Multiply the number of servings by the number of carbohydrates per serving.  The resulting number is the total amount of carbohydrates that you will be having. Learning standard serving sizes of other foods  When you eat foods containing carbohydrates that are not packaged or do not include "Nutrition Facts" on the label, you need to measure the servings in order to count the amount of carbohydrates:  Measure the foods that you will eat with a food scale or measuring  cup, if needed.  Decide how many standard-size servings you will eat.  Multiply the number of servings by 15. Most carbohydrate-rich foods have about 15 g of carbohydrates per serving.  For example, if you eat 8 oz (170 g) of strawberries, you will have eaten 2 servings and 30 g of carbohydrates (2 servings x 15 g = 30 g).  For foods that have more than one food mixed, such as soups and casseroles, you must count the carbohydrates in each food that is included. The following list contains standard serving sizes of common carbohydrate-rich foods. Each of these servings has about 15 g of carbohydrates:   hamburger bun or  English muffin.   oz (15 mL) syrup.   oz (14 g) jelly.  1 slice of bread.  1 six-inch tortilla.  3 oz (85 g) cooked rice or pasta.  4 oz (113 g) cooked dried beans.  4 oz (113 g) starchy vegetable, such as peas, corn, or potatoes.  4 oz (113 g) hot cereal.  4 oz (113 g) mashed potatoes or  of a large baked potato.  4 oz (113 g) canned or frozen fruit.  4 oz (120 mL) fruit juice.  4-6 crackers.  6 chicken nuggets.  6 oz (170 g) unsweetened dry cereal.  6 oz (170 g) plain fat-free yogurt or yogurt sweetened with artificial sweeteners.  8 oz (240 mL) milk.  8 oz (170 g) fresh fruit or one small piece of fruit.  24 oz (680 g) popped popcorn. Example of carbohydrate counting Sample meal  3 oz (85 g) chicken breast.  6 oz (  170 g) brown rice.  4 oz (113 g) corn.  8 oz (240 mL) milk.  8 oz (170 g) strawberries with sugar-free whipped topping. Carbohydrate calculation 1. Identify the foods that contain carbohydrates:  Rice.  Corn.  Milk.  Strawberries. 2. Calculate how many servings you have of each food:  2 servings rice.  1 serving corn.  1 serving milk.  1 serving strawberries. 3. Multiply each number of servings by 15 g:  2 servings rice x 15 g = 30 g.  1 serving corn x 15 g = 15 g.  1 serving milk x 15 g = 15  g.  1 serving strawberries x 15 g = 15 g. 4. Add together all of the amounts to find the total grams of carbohydrates eaten:  30 g + 15 g + 15 g + 15 g = 75 g of carbohydrates total. This information is not intended to replace advice given to you by your health care provider. Make sure you discuss any questions you have with your health care provider. Document Released: 02/15/2005 Document Revised: 09/05/2015 Document Reviewed: 07/30/2015 Elsevier Interactive Patient Education  2017 [employer].  

## 2016-04-29 NOTE — Assessment & Plan Note (Signed)
Continue Prozac, Wellbutrin and Lamictal for now I would like to get him off the Lamictal Will monitor for now

## 2016-04-29 NOTE — Assessment & Plan Note (Signed)
Encouraged weight loss, this would help your joint pain Continue Naproxen

## 2016-04-29 NOTE — Assessment & Plan Note (Signed)
Encouraged him to consume a low fat, low carb diet and exercise for weight loss Eye exam UTD Foot exam today Flu shot UTD Pneumovax today Continue Metformin for now, will adjust if needed

## 2016-04-29 NOTE — Progress Notes (Signed)
HPI  Pt presents to the clinic today to establish care and for management of the conditions listed below. He is transferring care from Anthony Bamberger, NP.  Depression: Triggered by stress from family illness. He takes Prozac, Wellbutrin and Lamictal. He feels like this is well controlled  DM 2: He does not know the last time his A1C was checked. His does not monitor his sugars. He is taking Metformin daily. His last eye exam was 04/14/16. He does not check his feet daily.  GERD: Triggered by eating too much. He takes Protonix daily as prescribed. He denies breakthrough symptoms.  HTN: His BP today is 138/76. He is taking Amlodipine and Lisinopril as prescribed. He does not check it at home. ECG from 04/2013 reviewed.  Hypotestosteronism: He is taking Testerone OTC caps. He has not had his testosterone checked recently.  OA: Shoulders, elbows, knees and feet. He takes Naproxen daily. He has failed Celebrex, Lyrica in the past.  OSA: He wears a CPAP. He averages 6-7 hours of sleep at night. He still feels tired during the day.  Obesity: His weight today is 351 lbs with a BMI of 46. He does not adhere to any diet or exercise regimen.  Seasonal Allergies: Worse in the spring. He takes Zyrtec and Flonase as needed with good relief.  Flu: 12/2015 Tetanus: 1999 Pneumovax: 2013 PSA Screening: unsure Colon Screening: 04/2009 Vision Screening: yearly Dentist :as needed, has dentures  Past Medical History:  Diagnosis Date  . Chronic pain 04/09/2011  . DEPRESSION 03/16/2007  . GERD (gastroesophageal reflux disease)   . HYPERTENSION 09/22/2006  . Hypogonadism male 04/09/2011  . Morbid obesity (HCC) 09/22/2006  . Osteoarthritis 04/09/2011  . Sleep apnea    uses a cpap-will use after surgery  . Wears glasses     Current Outpatient Prescriptions  Medication Sig Dispense Refill  . amLODipine (NORVASC) 10 MG tablet Take 10 mg by mouth daily.    Marland Kitchen buPROPion (WELLBUTRIN SR) 150 MG 12 hr tablet   0  .  cetirizine (ZYRTEC) 10 MG tablet Take 10 mg by mouth daily.    . Cholecalciferol (VITAMIN D3) 1000 units CAPS Take 1 capsule by mouth daily.    Marland Kitchen FLUoxetine (PROZAC) 40 MG capsule Take 40 mg by mouth daily.    . fluticasone (KLS ALLER-FLO) 50 MCG/ACT nasal spray Place 2 sprays into both nostrils daily.    Marland Kitchen lamoTRIgine (LAMICTAL) 100 MG tablet   5  . lisinopril (PRINIVIL,ZESTRIL) 10 MG tablet   1  . MEGARED OMEGA-3 KRILL OIL PO Take 750 mg by mouth daily.    . metFORMIN (GLUCOPHAGE) 500 MG tablet   0  . Misc Natural Products (NF FORMULAS TESTOSTERONE) CAPS Take 1 capsule by mouth 2 (two) times daily.    . Naproxen Sodium 220 MG CAPS Take 440 mg by mouth daily.    . pantoprazole (PROTONIX) 40 MG tablet Take 20-40 mg by mouth daily.     No current facility-administered medications for this visit.     Allergies  Allergen Reactions  . Penicillins     REACTION: unspecified    Family History  Problem Relation Age of Onset  . Heart disease Father   . Hypertension Father   . Diabetes Sister   . Stroke Paternal Uncle   . Diabetes Maternal Grandmother   . Arthritis Paternal Grandmother   . Heart disease Paternal Grandfather     Social History   Social History  . Marital status: Married  Spouse name: N/A  . Number of children: N/A  . Years of education: N/A   Occupational History  . Not on file.   Social History Main Topics  . Smoking status: Never Smoker  . Smokeless tobacco: Current User    Types: Chew  . Alcohol use Yes  . Drug use: No  . Sexual activity: Not on file   Other Topics Concern  . Not on file   Social History Narrative  . No narrative on file    ROS:  Constitutional: Pt reports fatigue. Denies fever, malaise, headache or abrupt weight changes.  HEENT: Denies eye pain, eye redness, ear pain, ringing in the ears, wax buildup, runny nose, nasal congestion, bloody nose, or sore throat. Respiratory: Denies difficulty breathing, shortness of breath, cough  or sputum production.   Cardiovascular: Pt reports intermittent swelling in his legs. Denies chest pain, chest tightness, palpitations or swelling in the hands.  Gastrointestinal: Denies abdominal pain, bloating, constipation, diarrhea or blood in the stool.  GU: Denies frequency, urgency, pain with urination, blood in urine, odor or discharge. Musculoskeletal: Pt reports joint pain. Denies decrease in range of motion, difficulty with gait, muscle pain or joint swelling.  Skin: Denies redness, rashes, lesions or ulcercations.  Neurological: Denies dizziness, difficulty with memory, difficulty with speech or problems with balance and coordination.  Psych: Pt has history of depression. Denies anxiety,  SI/HI.  No other specific complaints in a complete review of systems (except as listed in HPI above).  PE:  BP 138/76   Pulse 81   Temp 98.1 F (36.7 C) (Oral)   Ht 6\' 1"  (1.854 m)   Wt (!) 351 lb 8 oz (159.4 kg)   SpO2 97%   BMI 46.37 kg/m   Wt Readings from Last 3 Encounters:  07/19/13 (!) 337 lb (152.9 kg)  05/17/13 (!) 334 lb (151.5 kg)  09/06/11 (!) 340 lb (154.2 kg)    General: Appears his stated age, obese in NAD. Neck: Neck supple, trachea midline. No masses, lumps or thyromegaly present.  Cardiovascular: Normal rate and rhythm. S1,S2 noted.  No murmur, rubs or gallops noted. No carotid bruits noted. Pulmonary/Chest: Normal effort and positive vesicular breath sounds. No respiratory distress. No wheezes, rales or ronchi noted.  Abdomen: Soft and nontender. Normal bowel sounds. Musculoskeletal:  No difficulty with gait.  Neurological: Alert and oriented.  Psychiatric: Mood and affect normal. Behavior is normal. Judgment and thought content normal.     BMET    Component Value Date/Time   NA 141 07/19/2013 0815   K 3.8 07/19/2013 0815   CL 102 07/19/2013 0815   CO2 25 05/15/2013 0922   GLUCOSE 113 (H) 07/19/2013 0815   BUN 15 07/19/2013 0815   CREATININE 1.00  07/19/2013 0815   CALCIUM 9.4 05/15/2013 0922   GFRNONAA >90 05/15/2013 0922   GFRAA >90 05/15/2013 0922    Lipid Panel     Component Value Date/Time   CHOL 138 12/05/2006 0846   TRIG 157 (H) 12/05/2006 0846   HDL 32.7 (L) 12/05/2006 0846   CHOLHDL 4.2 CALC 12/05/2006 0846   VLDL 31 12/05/2006 0846   LDLCALC 74 12/05/2006 0846    CBC    Component Value Date/Time   HGB 13.3 07/19/2013 0815   HCT 39.0 07/19/2013 0815    Hgb A1C No results found for: HGBA1C   Assessment and Plan:

## 2016-04-29 NOTE — Assessment & Plan Note (Signed)
Slightly elevated Will continue Lisinopril and Amlodipine for now CMET today

## 2016-04-29 NOTE — Assessment & Plan Note (Signed)
Encouraged him to work on diet and exercise TSH today

## 2016-04-30 ENCOUNTER — Telehealth: Payer: Self-pay

## 2016-04-30 ENCOUNTER — Telehealth: Payer: Self-pay | Admitting: Internal Medicine

## 2016-04-30 MED ORDER — METFORMIN HCL 500 MG PO TABS: 500.0000 mg | ORAL_TABLET | Freq: Two times a day (BID) | ORAL | 3 refills | Status: DC

## 2016-04-30 MED ORDER — ROSUVASTATIN CALCIUM 10 MG PO TABS: 10.0000 mg | ORAL_TABLET | Freq: Every day | ORAL | 3 refills | Status: DC

## 2016-04-30 NOTE — Telephone Encounter (Signed)
Mrs Benjiman CoreHinton called to make sure Pamala Hurry Baity NP knew pt was taking Metformin 500 mg twice a day; that is what is on the med list. Mrs Benjiman CoreHinton thinks pt told Shawna OrleansMelanie that he is taking Metformin once a day but pt has been taking metformin 500 mg bid. FYI to Pamala Hurry Baity NP.

## 2016-04-30 NOTE — Addendum Note (Signed)
Addended by: Roena MaladyEVONTENNO, MELANIE Y on: 04/30/2016 04:23 PM   Modules accepted: Orders

## 2016-04-30 NOTE — Telephone Encounter (Signed)
Pt returned call about labs  please call [phone] Thanks

## 2016-05-03 NOTE — Telephone Encounter (Signed)
Increase to 1000 mg BID

## 2016-05-04 MED ORDER — METFORMIN HCL 1000 MG PO TABS: 1000.0000 mg | ORAL_TABLET | Freq: Two times a day (BID) | ORAL | 1 refills | Status: DC

## 2016-05-04 NOTE — Telephone Encounter (Signed)
Pt's wife is aware---new Rx sent to pharmacy

## 2016-05-04 NOTE — Addendum Note (Signed)
Addended by: Roena MaladyEVONTENNO, Aurianna Earlywine Y on: 05/04/2016 02:58 PM   Modules accepted: Orders

## 2016-05-21 ENCOUNTER — Other Ambulatory Visit: Payer: Self-pay | Admitting: *Deleted

## 2016-05-21 MED ORDER — LISINOPRIL 10 MG PO TABS: 10.0000 mg | ORAL_TABLET | Freq: Every day | ORAL | 1 refills | Status: DC

## 2016-07-07 ENCOUNTER — Other Ambulatory Visit: Payer: Self-pay | Admitting: Internal Medicine

## 2016-08-09 ENCOUNTER — Ambulatory Visit (INDEPENDENT_AMBULATORY_CARE_PROVIDER_SITE_OTHER): Payer: BLUE CROSS/BLUE SHIELD | Admitting: Internal Medicine

## 2016-08-09 ENCOUNTER — Encounter: Payer: Self-pay | Admitting: Internal Medicine

## 2016-08-09 VITALS — BP 182/98 | HR 94 | Temp 98.4°F | Ht 71.75 in | Wt 347.5 lb

## 2016-08-09 DIAGNOSIS — Z0001 Encounter for general adult medical examination with abnormal findings: Secondary | ICD-10-CM

## 2016-08-09 DIAGNOSIS — R0989 Other specified symptoms and signs involving the circulatory and respiratory systems: Secondary | ICD-10-CM | POA: Diagnosis not present

## 2016-08-09 DIAGNOSIS — E7800 Pure hypercholesterolemia, unspecified: Secondary | ICD-10-CM | POA: Insufficient documentation

## 2016-08-09 DIAGNOSIS — Z114 Encounter for screening for human immunodeficiency virus [HIV]: Secondary | ICD-10-CM | POA: Diagnosis not present

## 2016-08-09 DIAGNOSIS — R05 Cough: Secondary | ICD-10-CM | POA: Diagnosis not present

## 2016-08-09 DIAGNOSIS — R0982 Postnasal drip: Secondary | ICD-10-CM | POA: Diagnosis not present

## 2016-08-09 DIAGNOSIS — I10 Essential (primary) hypertension: Secondary | ICD-10-CM

## 2016-08-09 DIAGNOSIS — Z1159 Encounter for screening for other viral diseases: Secondary | ICD-10-CM | POA: Diagnosis not present

## 2016-08-09 DIAGNOSIS — Z125 Encounter for screening for malignant neoplasm of prostate: Secondary | ICD-10-CM | POA: Diagnosis not present

## 2016-08-09 DIAGNOSIS — Z23 Encounter for immunization: Secondary | ICD-10-CM

## 2016-08-09 DIAGNOSIS — R059 Cough, unspecified: Secondary | ICD-10-CM

## 2016-08-09 DIAGNOSIS — E1169 Type 2 diabetes mellitus with other specified complication: Secondary | ICD-10-CM | POA: Insufficient documentation

## 2016-08-09 DIAGNOSIS — E119 Type 2 diabetes mellitus without complications: Secondary | ICD-10-CM | POA: Diagnosis not present

## 2016-08-09 LAB — PSA: PSA: 0.58 ng/mL (ref 0.10–4.00)

## 2016-08-09 LAB — COMPREHENSIVE METABOLIC PANEL
ALT: 62 U/L — ABNORMAL HIGH (ref 0–53)
AST: 46 U/L — ABNORMAL HIGH (ref 0–37)
Albumin: 4.3 g/dL (ref 3.5–5.2)
Alkaline Phosphatase: 74 U/L (ref 39–117)
BUN: 17 mg/dL (ref 6–23)
CO2: 25 mEq/L (ref 19–32)
Calcium: 9.5 mg/dL (ref 8.4–10.5)
Chloride: 101 mEq/L (ref 96–112)
Creatinine, Ser: 0.95 mg/dL (ref 0.40–1.50)
GFR: 88.18 mL/min (ref >60.00)
Glucose, Bld: 152 mg/dL — ABNORMAL HIGH (ref 70–99)
Potassium: 4.2 mEq/L (ref 3.5–5.1)
Sodium: 135 mEq/L (ref 135–145)
Total Bilirubin: 0.6 mg/dL (ref 0.2–1.2)
Total Protein: 6.6 g/dL (ref 6.0–8.3)

## 2016-08-09 LAB — LIPID PANEL
Cholesterol: 112 mg/dL (ref 0–200)
HDL: 26.8 mg/dL — ABNORMAL LOW (ref >39.00)
NonHDL: 85.24
Total CHOL/HDL Ratio: 4
Triglycerides: 228 mg/dL — ABNORMAL HIGH (ref 0.0–149.0)
VLDL: 45.6 mg/dL — ABNORMAL HIGH (ref 0.0–40.0)

## 2016-08-09 LAB — HEMOGLOBIN A1C: Hgb A1c MFr Bld: 7.2 % — ABNORMAL HIGH (ref 4.6–6.5)

## 2016-08-09 LAB — CBC
HCT: 38.8 % — ABNORMAL LOW (ref 39.0–52.0)
Hemoglobin: 12.8 g/dL — ABNORMAL LOW (ref 13.0–17.0)
MCHC: 32.9 g/dL (ref 30.0–36.0)
MCV: 80.7 fl (ref 78.0–100.0)
Platelets: 221 10*3/uL (ref 150.0–400.0)
RBC: 4.81 Mil/uL (ref 4.22–5.81)
RDW: 14.6 % (ref 11.5–15.5)
WBC: 5.6 10*3/uL (ref 4.0–10.5)

## 2016-08-09 LAB — LDL CHOLESTEROL, DIRECT: Direct LDL: 53 mg/dL

## 2016-08-09 NOTE — Progress Notes (Signed)
Subjective:    Patient ID: Anthony Castillo, male    DOB: 02-26-1966, 51 y.o.   MRN: [id number]  HPI  Pt presents to the clinic today for his annual exam. He is also due to follow up HLD and DM 2.  DM 2: His last A1C was 7.1%, 04/2016. His Metformin was increased to BID at that time. He has been taking the medication as directed. He does not monitor his blood sugars. He denies GI side effects. He does not check his feet often. His last eye exam was within the last 1.5 years.  HLD: His last LDL was 120, triglycerides 316, 04/2016. He was started on Rosuvastatin. He has been taking the medications as prescribed. He reports some mild muscle cramping. He does not consume a low fat diet.  Of note, his BP today is 182/98. He is taking his Norvasc and Lisinopril as prescribed. He reports his blood pressure is always high when he comes to the doctor. He denies headache, dizziness, chest pain or shortness of breath.  Flu: 12/2015 Tetanus: 1999 Pneumovax: 2013 PSA Screening: unsure Colon Screening: 04/2009 Vision Screening: yearly Dentist: as needed, has dentures  Diet: He does eat meat. He consumes fruits and veggies daily. He does eat fried foods. He drinks mostly water and sweet tea. Exercise: None outside of work.  Review of Systems  Past Medical History:  Diagnosis Date  . Chronic pain 04/09/2011  . DEPRESSION 03/16/2007  . GERD (gastroesophageal reflux disease)   . HYPERTENSION 09/22/2006  . Hypogonadism male 04/09/2011  . Morbid obesity (HCC) 09/22/2006  . Osteoarthritis 04/09/2011  . Sleep apnea    uses a cpap-will use after surgery  . Wears glasses     Current Outpatient Prescriptions  Medication Sig Dispense Refill  . amLODipine (NORVASC) 10 MG tablet Take 10 mg by mouth daily.    Marland Kitchen. buPROPion (WELLBUTRIN SR) 150 MG 12 hr tablet   0  . cetirizine (ZYRTEC) 10 MG tablet Take 10 mg by mouth daily.    . Cholecalciferol (VITAMIN D3) 1000 units CAPS Take 1 capsule by mouth daily.    Marland Kitchen.  FLUoxetine (PROZAC) 40 MG capsule Take 40 mg by mouth daily.    . fluticasone (KLS ALLER-FLO) 50 MCG/ACT nasal spray Place 2 sprays into both nostrils daily.    Marland Kitchen. lamoTRIgine (LAMICTAL) 100 MG tablet   5  . lisinopril (PRINIVIL,ZESTRIL) 10 MG tablet Take 1 tablet (10 mg total) by mouth daily. 90 tablet 1  . MEGARED OMEGA-3 KRILL OIL PO Take 750 mg by mouth daily.    . metFORMIN (GLUCOPHAGE) 1000 MG tablet TAKE 1 TABLET BY MOUTH TWICE A DAY WITH A MEAL. 60 tablet 1  . Misc Natural Products (NF FORMULAS TESTOSTERONE) CAPS Take 1 capsule by mouth 2 (two) times daily.    . Naproxen Sodium 220 MG CAPS Take 440 mg by mouth daily.    . pantoprazole (PROTONIX) 40 MG tablet Take 20-40 mg by mouth daily.    . rosuvastatin (CRESTOR) 10 MG tablet Take 1 tablet (10 mg total) by mouth daily. 30 tablet 3   No current facility-administered medications for this visit.     Allergies  Allergen Reactions  . Penicillins     REACTION: unspecified    Family History  Problem Relation Age of Onset  . Cancer Mother        lymphoma  . Heart disease Father   . Hypertension Father   . Diabetes Sister   . Stroke Paternal  Uncle   . Diabetes Maternal Grandmother   . Arthritis Paternal Grandmother   . Heart disease Paternal Grandfather     Social History   Social History  . Marital status: Married    Spouse name: N/A  . Number of children: N/A  . Years of education: N/A   Occupational History  . Not on file.   Social History Main Topics  . Smoking status: Never Smoker  . Smokeless tobacco: Current User    Types: Chew  . Alcohol use Yes     Comment: rare  . Drug use: No  . Sexual activity: Yes   Other Topics Concern  . Not on file   Social History Narrative  . No narrative on file     Constitutional: Denies fever, malaise, fatigue, headache or abrupt weight changes.  HEENT: Denies eye pain, eye redness, ear pain, ringing in the ears, wax buildup, runny nose, nasal congestion, bloody nose,  or sore throat. Respiratory: Pt reports cough and chest congestion. Denies difficulty breathing, shortness of breath.   Cardiovascular: Pt reports intermittent swelling in his legs. Denies chest pain, chest tightness, palpitations or swelling in the hands.  Gastrointestinal: Denies abdominal pain, bloating, constipation, diarrhea or blood in the stool.  GU: Denies urgency, frequency, pain with urination, burning sensation, blood in urine, odor or discharge. Musculoskeletal: Pt reports muscle cramps. Denies decrease in range of motion, difficulty with gait, muscle pain or joint pain and swelling.  Skin: Denies redness, rashes, lesions or ulcercations.  Neurological: Denies dizziness, difficulty with memory, difficulty with speech or problems with balance and coordination.  Psych: Denies anxiety, depression, SI/HI.  No other specific complaints in a complete review of systems (except as listed in HPI above).     Objective:   Physical Exam   BP (!) 182/98   Pulse 94   Temp 98.4 F (36.9 C) (Oral)   Ht 5' 11.75" (1.822 m)   Wt (!) 347 lb 8 oz (157.6 kg)   SpO2 96%   BMI 47.46 kg/m  Wt Readings from Last 3 Encounters:  08/09/16 (!) 347 lb 8 oz (157.6 kg)  04/29/16 (!) 351 lb 8 oz (159.4 kg)  07/19/13 (!) 337 lb (152.9 kg)    General: Appears his stated age, obese in NAD. Skin: Warm, dry and intact. Sun damage noted on bilateral forearms. HEENT: Head: normal shape and size; Eyes: sclera white, no icterus, conjunctiva pink, PERRLA and EOMs intact; Ears: Tm's gray and intact, normal light reflex; Throat/Mouth: Teeth present, mucosa pink and moist, + PND, no exudate, lesions or ulcerations noted.  Neck:  Neck supple, trachea midline. No masses, lumps or thyromegaly present.  Cardiovascular: Normal rate and rhythm. S1,S2 noted.  No murmur, rubs or gallops noted. Trace BLE edema. No carotid bruits noted. Pulmonary/Chest: Normal effort and positive vesicular breath sounds. No respiratory  distress. No wheezes, rales or ronchi noted.  Abdomen: Soft and nontender. Normal bowel sounds. Ventral hernia noted. Liver, spleen and kidneys non palpable. Musculoskeletal: Strength 5/5 BUE/BLE. No difficulty with gait.  Neurological: Alert and oriented. Cranial nerves II-XII grossly intact. Coordination normal.  Psychiatric: Mood and affect normal. Behavior is normal. Judgment and thought content normal.    BMET    Component Value Date/Time   NA 136 04/29/2016 1142   K 4.6 04/29/2016 1142   CL 101 04/29/2016 1142   CO2 29 04/29/2016 1142   GLUCOSE 92 04/29/2016 1142   BUN 13 04/29/2016 1142   CREATININE 1.15 04/29/2016 1142  CALCIUM 9.4 04/29/2016 1142   GFRNONAA >90 05/15/2013 0922   GFRAA >90 05/15/2013 0922    Lipid Panel     Component Value Date/Time   CHOL 199 04/29/2016 1142   TRIG 316.0 (H) 04/29/2016 1142   HDL 33.70 (L) 04/29/2016 1142   CHOLHDL 6 04/29/2016 1142   VLDL 63.2 (H) 04/29/2016 1142   LDLCALC 74 12/05/2006 0846    CBC    Component Value Date/Time   WBC 5.9 04/29/2016 1142   RBC 4.89 04/29/2016 1142   HGB 13.1 04/29/2016 1142   HCT 39.8 04/29/2016 1142   PLT 257.0 04/29/2016 1142   MCV 81.5 04/29/2016 1142   MCHC 32.8 04/29/2016 1142   RDW 14.3 04/29/2016 1142    Hgb A1C Lab Results  Component Value Date   HGBA1C 7.1 (H) 04/29/2016           Assessment & Plan:   Preventative Health Maintenance:  Encouraged him to get a flu shot in the fall Tdap today Pneumovax today Colon screening UTD Encouraged him to consume a balanced diet and exercise regimen Advised him to make an appt for his eye exam, he is past due Advised him to see a dentist as needed Will check CBC, CMET, Lipid, A1C, HIV, Hep C and PSA today  Cough, Chest Congestion secondary to PND:  Continue Mucinex Can try Zyrtec 10 mg QHS x 1 week as well  I will let you know when to return after I see your lab results Nicki Reaper, NP

## 2016-08-09 NOTE — Patient Instructions (Signed)

## 2016-08-09 NOTE — Assessment & Plan Note (Signed)
CMET and lipid profile today Encouraged him to consume a low fat diet Continue rosuvastatin for now, will adjust if needed based on labs

## 2016-08-09 NOTE — Assessment & Plan Note (Signed)
Repeat A1C today Continue Metformin for now Consume a low carb, low fat diet and exercise for weight loss Encouraged foot exams and eye exams Pneumovax today

## 2016-08-09 NOTE — Addendum Note (Signed)
Addended by: Desmond DikeKNIGHT, Carsyn Taubman H on: 08/09/2016 08:42 AM   Modules accepted: Orders

## 2016-08-09 NOTE — Assessment & Plan Note (Signed)
Repeat BP 142/88 Continue Amlodipine and Lisinopril Encouraged weight loss  Will monitor for now

## 2016-08-10 LAB — HEPATITIS C ANTIBODY: HCV Ab: NEGATIVE

## 2016-08-10 LAB — HIV ANTIBODY (ROUTINE TESTING W REFLEX): HIV 1&2 Ab, 4th Generation: NONREACTIVE

## 2016-08-26 ENCOUNTER — Other Ambulatory Visit: Payer: Self-pay | Admitting: Internal Medicine

## 2016-08-30 ENCOUNTER — Other Ambulatory Visit: Payer: Self-pay | Admitting: Internal Medicine

## 2016-09-03 ENCOUNTER — Other Ambulatory Visit: Payer: Self-pay | Admitting: Internal Medicine

## 2016-10-20 ENCOUNTER — Other Ambulatory Visit: Payer: Self-pay | Admitting: Internal Medicine

## 2016-10-20 NOTE — Telephone Encounter (Signed)
Does he psychiatry?

## 2016-10-20 NOTE — Telephone Encounter (Signed)
Was getting from previous PCP, will send electronically.

## 2016-10-20 NOTE — Telephone Encounter (Signed)
I do not see where you have ever filled this medication... Please advise

## 2016-12-01 ENCOUNTER — Other Ambulatory Visit: Payer: Self-pay | Admitting: Internal Medicine

## 2017-01-18 ENCOUNTER — Ambulatory Visit: Payer: Self-pay | Admitting: *Deleted

## 2017-01-18 ENCOUNTER — Ambulatory Visit: Payer: BLUE CROSS/BLUE SHIELD | Admitting: Family Medicine

## 2017-01-18 NOTE — Telephone Encounter (Signed)
   Reason for Disposition . [1] MODERATE headache (e.g., interferes with normal activities) AND [2] present > 24 hours AND [3] unexplained  (Exceptions: analgesics not tried, typical migraine, or headache part of viral illness)  Answer Assessment - Initial Assessment Questions 1. LOCATION: "Where does it hurt?"      Back of head at neck 2. ONSET: "When did the headache start?" (Minutes, hours or days)      Couple days 3. PATTERN: "Does the pain come and go, or has it been constant since it started?"     Constant 4. SEVERITY: "How bad is the pain?" and "What does it keep you from doing?"  (e.g., Scale 1-10; mild, moderate, or severe)   - MILD (1-3): doesn't interfere with normal activities    - MODERATE (4-7): interferes with normal activities or awakens from sleep    - SEVERE (8-10): excruciating pain, unable to do any normal activities        2-3 5. RECURRENT SYMPTOM: "Have you ever had headaches before?" If so, ask: "When was the last time?" and "What happened that time?"      Yes- not recently 6. CAUSE: "What do you think is causing the headache?"     no 7. MIGRAINE: "Have you been diagnosed with migraine headaches?" If so, ask: "Is this headache similar?"      no 8. HEAD INJURY: "Has there been any recent injury to the head?"      no 9. OTHER SYMPTOMS: "Do you have any other symptoms?" (fever, stiff neck, eye pain, sore throat, cold symptoms)     Shoulder pain- left some arm pain in the top of arm- no radiation, sone indigestion pain- but medication is not helping 10. PREGNANCY: "Is there any chance you are pregnant?" "When was your last menstrual period?"       n/a    Patient has had a headache and neck/shoulder pain for 2 days. He is not getting better. He wants to see his PCP. Offered Urgent Care and he is persistent about seeing his PCP- appointment given.  Protocols used: HEADACHE-A-AH

## 2017-01-18 NOTE — Telephone Encounter (Signed)
Patient called back to cancel appt.  Said he needed something closer to him.

## 2017-01-24 ENCOUNTER — Encounter: Payer: Self-pay | Admitting: Internal Medicine

## 2017-01-24 ENCOUNTER — Ambulatory Visit (INDEPENDENT_AMBULATORY_CARE_PROVIDER_SITE_OTHER): Payer: BLUE CROSS/BLUE SHIELD | Admitting: Internal Medicine

## 2017-01-24 VITALS — BP 142/82 | HR 82 | Temp 98.4°F | Wt 343.0 lb

## 2017-01-24 DIAGNOSIS — K219 Gastro-esophageal reflux disease without esophagitis: Secondary | ICD-10-CM | POA: Diagnosis not present

## 2017-01-24 DIAGNOSIS — E119 Type 2 diabetes mellitus without complications: Secondary | ICD-10-CM

## 2017-01-24 DIAGNOSIS — I10 Essential (primary) hypertension: Secondary | ICD-10-CM

## 2017-01-24 MED ORDER — GLIPIZIDE 10 MG PO TABS: 10.0000 mg | ORAL_TABLET | Freq: Two times a day (BID) | ORAL | 2 refills | Status: DC

## 2017-01-24 NOTE — Patient Instructions (Signed)
Heartburn Heartburn is a type of pain or discomfort that can happen in the throat or chest. It is often described as a burning pain. It may also cause a bad taste in the mouth. Heartburn may feel worse when you lie down or bend over. It may be caused by stomach contents that move back up (reflux) into the tube that connects the mouth with the stomach (esophagus). Follow these instructions at home: Take these actions to lessen your discomfort and to help avoid problems. Diet  Follow a diet as told by your doctor. You may need to avoid foods and drinks such as: ? Coffee and tea (with or without caffeine). ? Drinks that contain alcohol. ? Energy drinks and sports drinks. ? Carbonated drinks or sodas. ? Chocolate and cocoa. ? Peppermint and mint flavorings. ? Garlic and onions. ? Horseradish. ? Spicy and acidic foods, such as peppers, chili powder, curry powder, vinegar, hot sauces, and BBQ sauce. ? Citrus fruit juices and citrus fruits, such as oranges, lemons, and limes. ? Tomato-based foods, such as red sauce, chili, salsa, and pizza with red sauce. ? Fried and fatty foods, such as donuts, french fries, potato chips, and high-fat dressings. ? High-fat meats, such as hot dogs, rib eye steak, sausage, ham, and bacon. ? High-fat dairy items, such as whole milk, butter, and cream cheese.  Eat small meals often. Avoid eating large meals.  Avoid drinking large amounts of liquid with your meals.  Avoid eating meals during the 2-3 hours before bedtime.  Avoid lying down right after you eat.  Do not exercise right after you eat. General instructions  Pay attention to any changes in your symptoms.  Take over-the-counter and prescription medicines only as told by your doctor. Do not take aspirin, ibuprofen, or other NSAIDs unless your doctor says it is okay.  Do not use any tobacco products, including cigarettes, chewing tobacco, and e-cigarettes. If you need help quitting, ask your  doctor.  Wear loose clothes. Do not wear anything tight around your waist.  Raise (elevate) the head of your bed about 6 inches (15 cm).  Try to lower your stress. If you need help doing this, ask your doctor.  If you are overweight, lose an amount of weight that is healthy for you. Ask your doctor about a safe weight loss goal.  Keep all follow-up visits as told by your doctor. This is important. Contact a doctor if:  You have new symptoms.  You lose weight and you do not know why it is happening.  You have trouble swallowing, or it hurts to swallow.  You have wheezing or a cough that keeps happening.  Your symptoms do not get better with treatment.  You have heartburn often for more than two weeks. Get help right away if:  You have pain in your arms, neck, jaw, teeth, or back.  You feel sweaty, dizzy, or light-headed.  You have chest pain or shortness of breath.  You throw up (vomit) and your throw up looks like blood or coffee grounds.  Your poop (stool) is bloody or black. This information is not intended to replace advice given to you by your health care provider. Make sure you discuss any questions you have with your health care provider. Document Released: 10/28/2010 Document Revised: 07/24/2015 Document Reviewed: 06/12/2014 Elsevier Interactive Patient Education  2018 Elsevier Inc.  

## 2017-01-24 NOTE — Progress Notes (Signed)
Subjective:    Patient ID: Anthony Castillo, male    DOB: 03-01-1966, 51 y.o.   MRN: [id number]  HPI  Pt presents to the clinic today with c/o reflux. He reports he seems to be having a lot of reflux in the morning. He is taking Protonix as prescribed but reports if he doesn't take it with food, he has really bad reflux. He denies chest pain or chest tightness. He does feel like his reflux has gotten worse since he started the Metformin. He also has loose stools with the Metformin. His last A1C was 7.1%, 07/2016. He denies recent changes in diet or medications.  Of note, his blood pressure is mildly elevated today at 142/82. He is taking Amlodipine and Lisinopril as prescribed. ECG from 04/2013 reviewed. He is concerned with his age and health status that he may need a stress test. He reports he had one a few years ago that was normal.    Review of Systems      Past Medical History:  Diagnosis Date  . Chronic pain 04/09/2011  . DEPRESSION 03/16/2007  . GERD (gastroesophageal reflux disease)   . HYPERTENSION 09/22/2006  . Hypogonadism male 04/09/2011  . Morbid obesity ([facility]) 09/22/2006  . Osteoarthritis 04/09/2011  . Sleep apnea    uses a cpap-will use after surgery  . Wears glasses     Current Outpatient Medications  Medication Sig Dispense Refill  . amLODipine (NORVASC) 10 MG tablet TAKE 1 TABLET BY MOUTH EVERY DAY 90 tablet 1  . buPROPion (WELLBUTRIN SR) 150 MG 12 hr tablet   0  . cetirizine (ZYRTEC) 10 MG tablet Take 10 mg by mouth daily.    . Cholecalciferol (VITAMIN D3) 1000 units CAPS Take 1 capsule by mouth daily.    Marland Kitchen. FLUoxetine (PROZAC) 40 MG capsule TAKE 1 CAPSULE BY MOUTH ONCE DAILY 90 capsule 1  . fluticasone (KLS ALLER-FLO) 50 MCG/ACT nasal spray Place 2 sprays into both nostrils daily.    Marland Kitchen. lamoTRIgine (LAMICTAL) 100 MG tablet TAKE 1 TABLET BY MOUTH EVERY MORNING 30 tablet 5  . lisinopril (PRINIVIL,ZESTRIL) 10 MG tablet TAKE 1 TABLET BY MOUTH DAILY 90 tablet 3  . MEGARED  OMEGA-3 KRILL OIL PO Take 750 mg by mouth daily.    . metFORMIN (GLUCOPHAGE) 1000 MG tablet TAKE 1 TABLET BY MOUTH TWICE (2) DAILY WITH A MEAL 180 tablet 1  . Misc Natural Products (NF FORMULAS TESTOSTERONE) CAPS Take 1 capsule by mouth 2 (two) times daily.    . Naproxen Sodium 220 MG CAPS Take 440 mg by mouth daily.    . pantoprazole (PROTONIX) 40 MG tablet Take 20-40 mg by mouth daily.    . rosuvastatin (CRESTOR) 10 MG tablet TAKE 1 TABLET BY MOUTH ONCE DAILY 30 tablet 5   No current facility-administered medications for this visit.     Allergies  Allergen Reactions  . Penicillins     REACTION: unspecified    Family History  Problem Relation Age of Onset  . Cancer Mother        lymphoma  . Heart disease Father   . Hypertension Father   . Diabetes Sister   . Stroke Paternal Uncle   . Diabetes Maternal Grandmother   . Arthritis Paternal Grandmother   . Heart disease Paternal Grandfather     Social History   Socioeconomic History  . Marital status: Married    Spouse name: Not on file  . Number of children: Not on file  . Years  of education: Not on file  . Highest education level: Not on file  Social Needs  . Financial resource strain: Not on file  . Food insecurity - worry: Not on file  . Food insecurity - inability: Not on file  . Transportation needs - medical: Not on file  . Transportation needs - non-medical: Not on file  Occupational History  . Not on file  Tobacco Use  . Smoking status: Never Smoker  . Smokeless tobacco: Current User    Types: Chew  Substance and Sexual Activity  . Alcohol use: Yes    Comment: rare  . Drug use: No  . Sexual activity: Yes  Other Topics Concern  . Not on file  Social History Narrative  . Not on file     Constitutional: Denies fever, malaise, fatigue, headaches or abrupt weight changes.  Respiratory: Denies difficulty breathing, shortness of breath, cough or sputum production.   Cardiovascular: Denies chest pain, chest  tightness, palpitations or swelling in the hands or feet.  Gastrointestinal: Pt reports reflux. Denies abdominal pain, bloating, constipation, diarrhea or blood in the stool.   No other specific complaints in a complete review of systems (except as listed in HPI above).  Objective:   Physical Exam  BP (!) 142/82   Pulse 82   Temp 98.4 F (36.9 C) (Oral)   Wt (!) 343 lb (155.6 kg)   SpO2 96%   BMI 46.84 kg/m  Wt Readings from Last 3 Encounters:  01/24/17 (!) 343 lb (155.6 kg)  08/09/16 (!) 347 lb 8 oz (157.6 kg)  04/29/16 (!) 351 lb 8 oz (159.4 kg)    General: Appears his stated age, obese in NAD. Cardiovascular: Normal rate and rhythm. S1,S2 noted.  No murmur, rubs or gallops noted. No JVD or BLE edema.  Pulmonary/Chest: Normal effort and positive vesicular breath sounds. No respiratory distress. No wheezes, rales or ronchi noted.  Abdomen: Soft and nontender. Normal bowel sounds. No distention or masses noted.  BMET    Component Value Date/Time   NA 135 08/09/2016 0909   K 4.2 08/09/2016 0909   CL 101 08/09/2016 0909   CO2 25 08/09/2016 0909   GLUCOSE 152 (H) 08/09/2016 0909   BUN 17 08/09/2016 0909   CREATININE 0.95 08/09/2016 0909   CALCIUM 9.5 08/09/2016 0909   GFRNONAA >90 05/15/2013 0922   GFRAA >90 05/15/2013 0922    Lipid Panel     Component Value Date/Time   CHOL 112 08/09/2016 0909   TRIG 228.0 (H) 08/09/2016 0909   HDL 26.80 (L) 08/09/2016 0909   CHOLHDL 4 08/09/2016 0909   VLDL 45.6 (H) 08/09/2016 0909   LDLCALC 74 12/05/2006 0846    CBC    Component Value Date/Time   WBC 5.6 08/09/2016 0909   RBC 4.81 08/09/2016 0909   HGB 12.8 (L) 08/09/2016 0909   HCT 38.8 (L) 08/09/2016 0909   PLT 221.0 08/09/2016 0909   MCV 80.7 08/09/2016 0909   MCHC 32.9 08/09/2016 0909   RDW 14.6 08/09/2016 0909    Hgb A1C Lab Results  Component Value Date   HGBA1C 7.2 (H) 08/09/2016            Assessment & Plan:   GERD:  Discussed avoiding greasy  foods, tomato based foods, alcohol etc Continue Protonix for now Will switch Metformin to Glipizide to see if this helps his GI issues If no improvement, consider checking H Pylori and increasing Protonix to BID  HTN:  He is hesitant  to add any more meds at this time Advised him we may just need to increase Lisinopril to 20 mg daily, he wants to hold off at this time Discussed DASH diet and exercise for weight loss  Return precautions discussed Nicki Reaper, NP

## 2017-01-25 ENCOUNTER — Other Ambulatory Visit: Payer: Self-pay | Admitting: Internal Medicine

## 2017-01-26 ENCOUNTER — Other Ambulatory Visit: Payer: Self-pay

## 2017-02-02 ENCOUNTER — Other Ambulatory Visit: Payer: Self-pay

## 2017-02-02 NOTE — Telephone Encounter (Signed)
Can we call him and see if he is taking this once or twice daily?

## 2017-02-02 NOTE — Telephone Encounter (Signed)
Please advise if pt is to be taking QD or BID

## 2017-02-03 ENCOUNTER — Other Ambulatory Visit: Payer: Self-pay | Admitting: Internal Medicine

## 2017-02-03 MED ORDER — PANTOPRAZOLE SODIUM 40 MG PO TBEC: 40.0000 mg | DELAYED_RELEASE_TABLET | Freq: Every day | ORAL | 2 refills | Status: DC

## 2017-02-03 NOTE — Telephone Encounter (Signed)
Wife is call ing about rx refill - she must have it filled tonight, as pt is going out of town tomorrow. Pt takes this medication 1 time a day.  Pt uses [facility] , they close at 6 pm.   cb number is [phone]

## 2017-02-03 NOTE — Telephone Encounter (Signed)
Rx sent through [employer]  

## 2017-02-25 ENCOUNTER — Other Ambulatory Visit: Payer: Self-pay | Admitting: Internal Medicine

## 2017-03-17 ENCOUNTER — Other Ambulatory Visit: Payer: Self-pay | Admitting: Internal Medicine

## 2017-03-28 DIAGNOSIS — G4733 Obstructive sleep apnea (adult) (pediatric): Secondary | ICD-10-CM | POA: Diagnosis not present

## 2017-04-11 ENCOUNTER — Telehealth: Payer: Self-pay | Admitting: Internal Medicine

## 2017-04-11 NOTE — Telephone Encounter (Signed)
Pt's wife dropped off form to be filled out for respirator. Placed in RX tower.

## 2017-04-12 ENCOUNTER — Other Ambulatory Visit: Payer: Self-pay | Admitting: Internal Medicine

## 2017-04-12 NOTE — Telephone Encounter (Signed)
Gave forms to pt.  Copy for scan

## 2017-04-12 NOTE — Telephone Encounter (Signed)
Form filled out and given to [employer]

## 2017-04-12 NOTE — Telephone Encounter (Signed)
Copied from CRM [phone] [phone]. Topic: Inquiry >> Apr 12, 2017 12:08 PM Raquel SarnaHayes, Teresa G wrote: Pt's wife called back to see it the form was filled out.  Needing the form filled out ASAP  04-11-17 note *Pt's wife dropped off form to be filled out for respirator. Placed in RX tower.

## 2017-04-13 DIAGNOSIS — G4733 Obstructive sleep apnea (adult) (pediatric): Secondary | ICD-10-CM | POA: Diagnosis not present

## 2017-04-27 ENCOUNTER — Other Ambulatory Visit: Payer: Self-pay | Admitting: Internal Medicine

## 2017-05-11 DIAGNOSIS — G4733 Obstructive sleep apnea (adult) (pediatric): Secondary | ICD-10-CM | POA: Diagnosis not present

## 2017-05-19 DIAGNOSIS — G4733 Obstructive sleep apnea (adult) (pediatric): Secondary | ICD-10-CM | POA: Diagnosis not present

## 2017-05-23 ENCOUNTER — Ambulatory Visit: Payer: BLUE CROSS/BLUE SHIELD | Admitting: Internal Medicine

## 2017-05-23 ENCOUNTER — Encounter: Payer: Self-pay | Admitting: Internal Medicine

## 2017-05-23 VITALS — BP 146/78 | HR 82 | Temp 98.5°F | Wt 356.0 lb

## 2017-05-23 DIAGNOSIS — E119 Type 2 diabetes mellitus without complications: Secondary | ICD-10-CM

## 2017-05-23 DIAGNOSIS — L84 Corns and callosities: Secondary | ICD-10-CM | POA: Diagnosis not present

## 2017-05-23 DIAGNOSIS — Z9989 Dependence on other enabling machines and devices: Secondary | ICD-10-CM

## 2017-05-23 DIAGNOSIS — M2550 Pain in unspecified joint: Secondary | ICD-10-CM | POA: Diagnosis not present

## 2017-05-23 DIAGNOSIS — G4733 Obstructive sleep apnea (adult) (pediatric): Secondary | ICD-10-CM

## 2017-05-23 DIAGNOSIS — R5383 Other fatigue: Secondary | ICD-10-CM

## 2017-05-23 MED ORDER — FREESTYLE LIBRE 14 DAY READER DEVI: 1.0000 "application " | 3 refills | Status: DC

## 2017-05-23 NOTE — Progress Notes (Signed)
Subjective:    Patient ID: Anthony Castillo, male    DOB: [birth year], 52 y.o.   MRN: [id number]  HPI  Pt presents to the clinic today with multiple complaints.  Fatigue: He reports he has been extremely fatigued in the last 4-5 months. He denies recent changes in diet or activity level that could have triggered this. He is feeling stressed out but denies feeling depressed or anxious. He averages 7 hours of sleep at night with the use of his CPAP, which he just started using again 3 weeks ago but he has not noted any improvement in his fatige. He denies chest pain or shortness of breath. He is concerned that his testosterone may be low, but reports he is taking an OTC Testosterone Supplement.   Multiple Joint Pain: Mainly in his shoulders, back, hips and knees. He reports this is going on for years but worse in the last 2-3 weeks. The pain is constant. He describes the pain as achy. He denies numbness, tingling or weakness. He denies any recent tick bites. He denies any injuries but he has been a Interior and spatial designer for many years. He takes Aleve daily without much relief.  He also reports he can not check his blood sugars in his hands due to thick skin and calluses. He would like to know if he could get the [place]. His last A1C was 7.2%, 07/2016. He is taking his Glipizide as prescribed.  Review of Systems  Past Medical History:  Diagnosis Date  . Chronic pain 04/09/2011  . DEPRESSION 03/16/2007  . GERD (gastroesophageal reflux disease)   . HYPERTENSION 09/22/2006  . Hypogonadism male 04/09/2011  . Morbid obesity ([place]) 09/22/2006  . Osteoarthritis 04/09/2011  . Sleep apnea    uses a cpap-will use after surgery  . Wears glasses     Current Outpatient Medications  Medication Sig Dispense Refill  . amLODipine (NORVASC) 10 MG tablet TAKE 1 TABLET BY MOUTH ONCE DAILY 90 tablet 0  . buPROPion (WELLBUTRIN SR) 150 MG 12 hr tablet   0  . cetirizine (ZYRTEC) 10 MG tablet Take 10 mg by mouth daily.    .  Cholecalciferol (VITAMIN D3) 1000 units CAPS Take 1 capsule by mouth daily.    Marland Kitchen FLUoxetine (PROZAC) 40 MG capsule TAKE 1 CAPSULE BY MOUTH ONCE DAILY 90 capsule 1  . fluticasone (KLS ALLER-FLO) 50 MCG/ACT nasal spray Place 2 sprays into both nostrils daily.    Marland Kitchen glipiZIDE (GLUCOTROL) 10 MG tablet TAKE 1 TABLET BY MOUTH TWICE (2) DAILY BEFORE A MEAL 60 tablet 2  . lamoTRIgine (LAMICTAL) 100 MG tablet TAKE 1 TABLET BY MOUTH EVERY MORNING 30 tablet 2  . lisinopril (PRINIVIL,ZESTRIL) 10 MG tablet TAKE 1 TABLET BY MOUTH DAILY 90 tablet 3  . MEGARED OMEGA-3 KRILL OIL PO Take 750 mg by mouth daily.    . Misc Natural Products (NF FORMULAS TESTOSTERONE) CAPS Take 1 capsule by mouth 2 (two) times daily.    . Naproxen Sodium 220 MG CAPS Take 440 mg by mouth daily.    . pantoprazole (PROTONIX) 40 MG tablet TAKE 1 TABLET BY MOUTH ONCE DAILY 30 tablet 2  . rosuvastatin (CRESTOR) 10 MG tablet TAKE 1 TABLET BY MOUTH ONCE DAILY 30 tablet 5   No current facility-administered medications for this visit.     Allergies  Allergen Reactions  . Penicillins     REACTION: unspecified    Family History  Problem Relation Age of Onset  . Cancer Mother  lymphoma  . Heart disease Father   . Hypertension Father   . Diabetes Sister   . Stroke Paternal Uncle   . Diabetes Maternal Grandmother   . Arthritis Paternal Grandmother   . Heart disease Paternal Grandfather     Social History   Socioeconomic History  . Marital status: Married    Spouse name: Not on file  . Number of children: Not on file  . Years of education: Not on file  . Highest education level: Not on file  Occupational History  . Not on file  Social Needs  . Financial resource strain: Not on file  . Food insecurity:    Worry: Not on file    Inability: Not on file  . Transportation needs:    Medical: Not on file    Non-medical: Not on file  Tobacco Use  . Smoking status: Never Smoker  . Smokeless tobacco: Current User    Types:  Chew  Substance and Sexual Activity  . Alcohol use: Yes    Comment: rare  . Drug use: No  . Sexual activity: Yes  Lifestyle  . Physical activity:    Days per week: Not on file    Minutes per session: Not on file  . Stress: Not on file  Relationships  . Social connections:    Talks on phone: Not on file    Gets together: Not on file    Attends religious service: Not on file    Active member of club or organization: Not on file    Attends meetings of clubs or organizations: Not on file    Relationship status: Not on file  . Intimate partner violence:    Fear of current or ex partner: Not on file    Emotionally abused: Not on file    Physically abused: Not on file    Forced sexual activity: Not on file  Other Topics Concern  . Not on file  Social History Narrative  . Not on file     Constitutional: Pt reports fatigue. Denies fever, malaise, headache or abrupt weight changes.  Musculoskeletal: Pt reports multiple joint pain. Denies difficulty with gait, muscle pain or joint swelling.  Skin: Pt reports thick calloused skin on fingers. Denies redness, rashes, lesions or ulcercations.  Neurological: Denies dizziness, difficulty with memory, difficulty with speech or problems with balance and coordination.  Psych: Pt reports stress. Denies anxiety, depression, SI/HI.  No other specific complaints in a complete review of systems (except as listed in HPI above).     Objective:   Physical Exam    BP (!) 146/78   Pulse 82   Temp 98.5 F (36.9 C) (Oral)   Wt (!) 356 lb (161.5 kg)   SpO2 96%   BMI 48.62 kg/m  Wt Readings from Last 3 Encounters:  05/23/17 (!) 356 lb (161.5 kg)  01/24/17 (!) 343 lb (155.6 kg)  08/09/16 (!) 347 lb 8 oz (157.6 kg)    General: Appears his stated age, obese in NAD. Skin: Thick calloused skin noted on bilateral fingertips. Cardiovascular: Normal rate and rhythm. S1,S2 noted.  No murmur, rubs or gallops noted. Pulmonary/Chest: Normal effort  and positive vesicular breath sounds. No respiratory distress. No wheezes, rales or ronchi noted.  Musculoskeletal: Normal external ROM of bilateral shoulders. Decreased internal ROM of bilateral shoulders. Negative drop can test bilaterally. Normal flexion and rotation of the spine. Pain with extension. Normal flexion, extension and external rotation of the hips. Pain with internal rotation of  the hips. Normal flexion and extension of the knees. No joint swelling noted. No pain with palpation of the joints. Strength 5/5 BUE/BLE. Neurological: Alert and oriented.  Psychiatric: Mood and affect normal. Behavior is normal. Judgment and thought content normal.    BMET    Component Value Date/Time   NA 135 08/09/2016 0909   K 4.2 08/09/2016 0909   CL 101 08/09/2016 0909   CO2 25 08/09/2016 0909   GLUCOSE 152 (H) 08/09/2016 0909   BUN 17 08/09/2016 0909   CREATININE 0.95 08/09/2016 0909   CALCIUM 9.5 08/09/2016 0909   GFRNONAA >90 05/15/2013 0922   GFRAA >90 05/15/2013 0922    Lipid Panel     Component Value Date/Time   CHOL 112 08/09/2016 0909   TRIG 228.0 (H) 08/09/2016 0909   HDL 26.80 (L) 08/09/2016 0909   CHOLHDL 4 08/09/2016 0909   VLDL 45.6 (H) 08/09/2016 0909   LDLCALC 74 12/05/2006 0846    CBC    Component Value Date/Time   WBC 5.6 08/09/2016 0909   RBC 4.81 08/09/2016 0909   HGB 12.8 (L) 08/09/2016 0909   HCT 38.8 (L) 08/09/2016 0909   PLT 221.0 08/09/2016 0909   MCV 80.7 08/09/2016 0909   MCHC 32.9 08/09/2016 0909   RDW 14.6 08/09/2016 0909    Hgb A1C Lab Results  Component Value Date   HGBA1C 7.2 (H) 08/09/2016          Assessment & Plan:   Fatigue, OSA :  Will check CBC, CMET, TSH, Testosterone, B12 and Vit D today If labs normal, I ? Whether his CPAP needs adjustment Encouraged exercise for weight loss, increase energy etc  Multiple Joint Pain;  Will check B. Burgdorferi, RMSF, Ehrlichia, Vit D, ESR, CRP, ANA and RF today Discussed how weight  loss could help take some pressure off the joints Encouraged him to start Glucosamine-Chondroitin Continue Aleve for now, consider Diclofenac vs Celebrex if no improvement  DM 2, Calloused Fingers:  Will check A1C today Encouraged him to consume a low carb diet and exercise for weight loss Encouraged yearly eye exams and foot exams Pneumovax UTD Continue Glipizide for now, will adjust if needed based on labs Will order Elenor Legato  Will follow up after labs, return precautions discussed Webb Silversmith, NP

## 2017-05-23 NOTE — Patient Instructions (Signed)

## 2017-05-24 ENCOUNTER — Other Ambulatory Visit (INDEPENDENT_AMBULATORY_CARE_PROVIDER_SITE_OTHER): Payer: BLUE CROSS/BLUE SHIELD

## 2017-05-24 DIAGNOSIS — R5383 Other fatigue: Secondary | ICD-10-CM | POA: Diagnosis not present

## 2017-05-24 DIAGNOSIS — M2550 Pain in unspecified joint: Secondary | ICD-10-CM

## 2017-05-24 LAB — COMPREHENSIVE METABOLIC PANEL
ALT: 34 U/L (ref 0–53)
AST: 31 U/L (ref 0–37)
Albumin: 4.1 g/dL (ref 3.5–5.2)
Alkaline Phosphatase: 83 U/L (ref 39–117)
BUN: 11 mg/dL (ref 6–23)
CO2: 27 mEq/L (ref 19–32)
Calcium: 8.9 mg/dL (ref 8.4–10.5)
Chloride: 99 mEq/L (ref 96–112)
Creatinine, Ser: 1.07 mg/dL (ref 0.40–1.50)
GFR: 76.63 mL/min (ref >60.00)
Glucose, Bld: 237 mg/dL — ABNORMAL HIGH (ref 70–99)
Potassium: 4.1 mEq/L (ref 3.5–5.1)
Sodium: 133 mEq/L — ABNORMAL LOW (ref 135–145)
Total Bilirubin: 0.7 mg/dL (ref 0.2–1.2)
Total Protein: 6.7 g/dL (ref 6.0–8.3)

## 2017-05-24 LAB — VITAMIN D 25 HYDROXY (VIT D DEFICIENCY, FRACTURES): VITD: 35.54 ng/mL (ref 30.00–100.00)

## 2017-05-24 LAB — CBC
HCT: 37.7 % — ABNORMAL LOW (ref 39.0–52.0)
Hemoglobin: 12.6 g/dL — ABNORMAL LOW (ref 13.0–17.0)
MCHC: 33.6 g/dL (ref 30.0–36.0)
MCV: 80.8 fl (ref 78.0–100.0)
Platelets: 222 10*3/uL (ref 150.0–400.0)
RBC: 4.66 Mil/uL (ref 4.22–5.81)
RDW: 14.9 % (ref 11.5–15.5)
WBC: 5 10*3/uL (ref 4.0–10.5)

## 2017-05-24 LAB — VITAMIN B12: Vitamin B-12: 381 pg/mL (ref 211–911)

## 2017-05-24 LAB — HEMOGLOBIN A1C: Hgb A1c MFr Bld: 6.7 % — ABNORMAL HIGH (ref 4.6–6.5)

## 2017-05-24 LAB — SEDIMENTATION RATE: Sed Rate: 24 mm/hr — ABNORMAL HIGH (ref 0–20)

## 2017-05-24 LAB — HIGH SENSITIVITY CRP: CRP, High Sensitivity: 2.57 mg/L (ref 0.000–5.000)

## 2017-05-24 LAB — TSH: TSH: 1.8 u[IU]/mL (ref 0.35–4.50)

## 2017-05-24 LAB — TESTOSTERONE: Testosterone: 174.04 ng/dL — ABNORMAL LOW (ref 300.00–890.00)

## 2017-05-27 ENCOUNTER — Other Ambulatory Visit: Payer: Self-pay | Admitting: Internal Medicine

## 2017-05-27 LAB — B. BURGDORFI ANTIBODIES BY WB

## 2017-05-27 LAB — EHRLICHIA ANTIBODY PANEL
E. CHAFFEENSIS AB IGG: <1:64 {titer}
E. CHAFFEENSIS AB IGM: <1:20 {titer}

## 2017-05-27 LAB — ROCKY MTN SPOTTED FVR ABS PNL(IGG+IGM)
RMSF IgG: NOT DETECTED
RMSF IgM: NOT DETECTED

## 2017-05-27 LAB — RHEUMATOID FACTOR: Rhuematoid fact SerPl-aCnc: <14 IU/mL (ref <14)

## 2017-05-27 LAB — ANA: Anti Nuclear Antibody(ANA): NEGATIVE

## 2017-06-01 ENCOUNTER — Other Ambulatory Visit: Payer: Self-pay | Admitting: Internal Medicine

## 2017-06-01 DIAGNOSIS — R7989 Other specified abnormal findings of blood chemistry: Secondary | ICD-10-CM

## 2017-06-10 ENCOUNTER — Telehealth: Payer: Self-pay

## 2017-06-10 MED ORDER — FREESTYLE LIBRE 14 DAY SENSOR MISC: 1.0000 | 2 refills | Status: DC

## 2017-06-10 NOTE — Telephone Encounter (Signed)
Spoke with Anthony Castillo with [employer]... The Rx for the device was sent in as 2 devices with 2 refills... Verified that it should only be 1 device

## 2017-06-11 DIAGNOSIS — G4733 Obstructive sleep apnea (adult) (pediatric): Secondary | ICD-10-CM | POA: Diagnosis not present

## 2017-06-15 NOTE — Telephone Encounter (Signed)
[email or web address] and has been denied... Pt is aware as instructed

## 2017-06-20 ENCOUNTER — Ambulatory Visit: Payer: Self-pay

## 2017-06-20 NOTE — Telephone Encounter (Signed)
Patient called in with c/o "muscular pain." He says "this has been going on for 6 months and I was just seen last month, had blood work and it showed my testosterone is low. I have an appointment coming up for that. Some days the pain is a 4-5 and others it's a 10. Today it's a 10. I can't hardly do any work because of the pain." I asked about fever, chest pain, weakness, rash, cold symptoms, he denies, he says "I'm just tired." According to protocol, see PCP within 3 days, appointment scheduled for tomorrow at 1030, care advice given, patient verbalized understanding.   Reason for Disposition . [1] MODERATE pain (e.g., interferes with normal activities) AND [2] present > 3 days  Answer Assessment - Initial Assessment Questions 1. ONSET: "When did the muscle aches or body pains start?"      Last 6 months 2. LOCATION: "What part of your body is hurting?" (e.g., entire body, arms, legs)      All over, muscular 3. SEVERITY: "How bad is the pain?" (Scale 1-10; or mild, moderate, severe)   - MILD (1-3): doesn't interfere with normal activities    - MODERATE (4-7): interferes with normal activities or awakens from sleep    - SEVERE (8-10):  excruciating pain, unable to do any normal activities     Some days 4-5; others 10; today is a 10 4. CAUSE: "What do you think is causing the pains?"     I don't know 5. FEVER: "Have you been having fever?"     No 6. OTHER SYMPTOMS: "Do you have any other symptoms?" (e.g., chest pain, weakness, rash, cold or flu symptoms, weight loss)     Tired 7. PREGNANCY: "Is there any chance you are pregnant?" "When was your last menstrual period?"     N/A 8. TRAVEL: "Have you traveled out of the country in the last month?" (e.g., travel history, exposures)     No  Protocols used: MUSCLE ACHES AND BODY PAIN-A-AH

## 2017-06-21 ENCOUNTER — Ambulatory Visit: Payer: BLUE CROSS/BLUE SHIELD | Admitting: Internal Medicine

## 2017-06-21 VITALS — BP 150/70 | HR 84 | Temp 98.5°F | Wt 361.2 lb

## 2017-06-21 DIAGNOSIS — M791 Myalgia, unspecified site: Secondary | ICD-10-CM

## 2017-06-21 DIAGNOSIS — M2550 Pain in unspecified joint: Secondary | ICD-10-CM | POA: Diagnosis not present

## 2017-06-21 MED ORDER — CELECOXIB 100 MG PO CAPS: 100.0000 mg | ORAL_CAPSULE | Freq: Two times a day (BID) | ORAL | 2 refills | Status: DC

## 2017-06-26 ENCOUNTER — Encounter: Payer: Self-pay | Admitting: Internal Medicine

## 2017-06-26 NOTE — Patient Instructions (Signed)

## 2017-06-26 NOTE — Progress Notes (Signed)
Subjective:    Patient ID: Anthony Castillo, male    DOB: [birth year], 52 y.o.   MRN: [id number]  HPI  Pt presents to the clinic today to follow up fatigue, muscle pain and multiple joint pain. He was seen for the same 05/23/2017. He did have some low testosterone, and has been referred to urology for treatment. His other labs were negative. He reports continued muscle and joint pain. He is very active but does not routinely exercise. He takes Naproxen as needed with minimal relief. He is morbidly obese.  Review of Systems      Past Medical History:  Diagnosis Date  . Chronic pain 04/09/2011  . DEPRESSION 03/16/2007  . GERD (gastroesophageal reflux disease)   . HYPERTENSION 09/22/2006  . Hypogonadism male 04/09/2011  . Morbid obesity (HCC) 09/22/2006  . Osteoarthritis 04/09/2011  . Sleep apnea    uses a cpap-will use after surgery  . Wears glasses     Current Outpatient Medications  Medication Sig Dispense Refill  . amLODipine (NORVASC) 10 MG tablet TAKE 1 TABLET BY MOUTH ONCE DAILY 90 tablet 1  . buPROPion (WELLBUTRIN SR) 150 MG 12 hr tablet   0  . cetirizine (ZYRTEC) 10 MG tablet Take 10 mg by mouth daily.    . Cholecalciferol (VITAMIN D3) 1000 units CAPS Take 1 capsule by mouth daily.    . Continuous Blood Gluc Receiver (FREESTYLE LIBRE 14 DAY READER) DEVI 1 application by Does not apply route every 14 (fourteen) days. 2 Device 3  . Continuous Blood Gluc Sensor (FREESTYLE LIBRE 14 DAY SENSOR) MISC 1 each by Does not apply route every 14 (fourteen) days. 2 each 2  . FLUoxetine (PROZAC) 40 MG capsule TAKE 1 CAPSULE BY MOUTH ONCE DAILY 90 capsule 1  . fluticasone (KLS ALLER-FLO) 50 MCG/ACT nasal spray Place 2 sprays into both nostrils daily.    Marland Kitchen glipiZIDE (GLUCOTROL) 10 MG tablet TAKE 1 TABLET BY MOUTH TWICE (2) DAILY BEFORE A MEAL 60 tablet 2  . lamoTRIgine (LAMICTAL) 100 MG tablet TAKE 1 TABLET BY MOUTH EVERY MORNING 30 tablet 2  . lisinopril (PRINIVIL,ZESTRIL) 10 MG tablet TAKE 1  TABLET BY MOUTH DAILY 90 tablet 3  . MEGARED OMEGA-3 KRILL OIL PO Take 750 mg by mouth daily.    . Misc Natural Products (NF FORMULAS TESTOSTERONE) CAPS Take 1 capsule by mouth 2 (two) times daily.    . Naproxen Sodium 220 MG CAPS Take 440 mg by mouth daily.    . pantoprazole (PROTONIX) 40 MG tablet TAKE 1 TABLET BY MOUTH ONCE DAILY 30 tablet 2  . rosuvastatin (CRESTOR) 10 MG tablet TAKE 1 TABLET BY MOUTH ONCE DAILY 30 tablet 5  . celecoxib (CELEBREX) 100 MG capsule Take 1 capsule (100 mg total) by mouth 2 (two) times daily. 60 capsule 2   No current facility-administered medications for this visit.     Allergies  Allergen Reactions  . Penicillins     REACTION: unspecified    Family History  Problem Relation Age of Onset  . Cancer Mother        lymphoma  . Heart disease Father   . Hypertension Father   . Diabetes Sister   . Stroke Paternal Uncle   . Diabetes Maternal Grandmother   . Arthritis Paternal Grandmother   . Heart disease Paternal Grandfather     Social History   Socioeconomic History  . Marital status: Married    Spouse name: Not on file  . Number of children:  Not on file  . Years of education: Not on file  . Highest education level: Not on file  Occupational History  . Not on file  Social Needs  . Financial resource strain: Not on file  . Food insecurity:    Worry: Not on file    Inability: Not on file  . Transportation needs:    Medical: Not on file    Non-medical: Not on file  Tobacco Use  . Smoking status: Never Smoker  . Smokeless tobacco: Current User    Types: Chew  Substance and Sexual Activity  . Alcohol use: Yes    Comment: rare  . Drug use: No  . Sexual activity: Yes  Lifestyle  . Physical activity:    Days per week: Not on file    Minutes per session: Not on file  . Stress: Not on file  Relationships  . Social connections:    Talks on phone: Not on file    Gets together: Not on file    Attends religious service: Not on file     Active member of club or organization: Not on file    Attends meetings of clubs or organizations: Not on file    Relationship status: Not on file  . Intimate partner violence:    Fear of current or ex partner: Not on file    Emotionally abused: Not on file    Physically abused: Not on file    Forced sexual activity: Not on file  Other Topics Concern  . Not on file  Social History Narrative  . Not on file     Constitutional: Pt reports fatigue. Denies fever, malaise, headache or abrupt weight changes.  Musculoskeletal: Pt reports muscle and joint pain. Denies decrease in range of motion, difficulty with gait, or joint swelling.    No other specific complaints in a complete review of systems (except as listed in HPI above).  Objective:   Physical Exam   BP (!) 150/70 (BP Location: Right Arm, Patient Position: Sitting, Cuff Size: Normal)   Pulse 84   Temp 98.5 F (36.9 C) (Oral)   Wt (!) 361 lb 4 oz (163.9 kg)   SpO2 96%   BMI 49.34 kg/m  Wt Readings from Last 3 Encounters:  06/21/17 (!) 361 lb 4 oz (163.9 kg)  05/23/17 (!) 356 lb (161.5 kg)  01/24/17 (!) 343 lb (155.6 kg)    General: Appears his stated age, obese in NAD. Musculoskeletal: Strength 5/5 BUE/BLE. No signs of joint swelling. Neurological: Alert and oriented. Sensation intact to BUE/BLE.    BMET    Component Value Date/Time   NA 133 (L) 05/24/2017 0905   K 4.1 05/24/2017 0905   CL 99 05/24/2017 0905   CO2 27 05/24/2017 0905   GLUCOSE 237 (H) 05/24/2017 0905   BUN 11 05/24/2017 0905   CREATININE 1.07 05/24/2017 0905   CALCIUM 8.9 05/24/2017 0905   GFRNONAA >90 05/15/2013 0922   GFRAA >90 05/15/2013 0922    Lipid Panel     Component Value Date/Time   CHOL 112 08/09/2016 0909   TRIG 228.0 (H) 08/09/2016 0909   HDL 26.80 (L) 08/09/2016 0909   CHOLHDL 4 08/09/2016 0909   VLDL 45.6 (H) 08/09/2016 0909   LDLCALC 74 12/05/2006 0846    CBC    Component Value Date/Time   WBC 5.0 05/24/2017 0905    RBC 4.66 05/24/2017 0905   HGB 12.6 (L) 05/24/2017 0905   HCT 37.7 (L) 05/24/2017 4401  PLT 222.0 05/24/2017 0905   MCV 80.8 05/24/2017 0905   MCHC 33.6 05/24/2017 0905   RDW 14.9 05/24/2017 0905    Hgb A1C Lab Results  Component Value Date   HGBA1C 6.7 (H) 05/24/2017           Assessment & Plan:   Chronic Muscle/Joint Pain:  Discussed the importance of weight loss/regular exercise  Will refer to rheumatology for further evaluation Will trial Celebrex, he will let me know if not improving  Return precautions discussed Nicki Reaper, NP

## 2017-06-29 ENCOUNTER — Encounter: Payer: Self-pay | Admitting: Urology

## 2017-06-29 ENCOUNTER — Ambulatory Visit: Payer: BLUE CROSS/BLUE SHIELD | Admitting: Urology

## 2017-06-29 VITALS — BP 156/66 | HR 81 | Resp 16 | Ht 72.0 in | Wt 355.7 lb

## 2017-06-29 DIAGNOSIS — E291 Testicular hypofunction: Secondary | ICD-10-CM | POA: Diagnosis not present

## 2017-06-29 LAB — URINALYSIS, COMPLETE
Bilirubin, UA: NEGATIVE
Ketones, UA: NEGATIVE
Leukocytes, UA: NEGATIVE
Nitrite, UA: NEGATIVE
Protein, UA: NEGATIVE
RBC, UA: NEGATIVE
Specific Gravity, UA: <1.005 — ABNORMAL LOW (ref 1.005–1.030)
Urobilinogen, Ur: 0.2 mg/dL (ref 0.2–1.0)
pH, UA: 5 (ref 5.0–7.5)

## 2017-06-29 NOTE — Progress Notes (Signed)
06/29/2017 9:57 AM   Anthony Castillo [birth year] [id number]  Referring provider: Lorre Munroe, NP [address] [place], [place] [address]  Chief Complaint  Patient presents with  . Hypogonadism    HPI: Anthony Castillo is a 52 year old male seen in consultation at the request of Nicki Reaper, NP for evaluation of hypogonadism.  He presents with an approximately 1 year history of tiredness, fatigue and low energy.  A testosterone level drawn on 05/24/2017 was low at 174 ng/dL.  He denies erectile dysfunction however has the diagnosis listed in Epic.  He denies decreased libido.  He denies bothersome lower urinary tract symptoms.  He denies previous history of urologic problems or prior urologic evaluation.  A PSA in June 2018 was 0.58.  He has sleep apnea and is on a CPAP.  He is taking an OTC testosterone supplement.   PMH: Past Medical History:  Diagnosis Date  . Chronic pain 04/09/2011  . DEPRESSION 03/16/2007  . GERD (gastroesophageal reflux disease)   . HYPERTENSION 09/22/2006  . Hypogonadism male 04/09/2011  . Morbid obesity (HCC) 09/22/2006  . Osteoarthritis 04/09/2011  . Sleep apnea    uses a cpap-will use after surgery  . Wears glasses     Surgical History: Past Surgical History:  Procedure Laterality Date  . CARPAL TUNNEL RELEASE Right 05/17/2013   Procedure: RIGHT CARPAL TUNNEL RELEASE;  Surgeon: Wyn Forster., MD;  Location: [facility];  Service: Orthopedics;  Laterality: Right;  . CARPAL TUNNEL RELEASE Left 07/19/2013   Procedure: LEFT CARPAL TUNNEL RELEASE;  Surgeon: Wyn Forster., MD;  Location: [facility];  Service: Orthopedics;  Laterality: Left;  . KNEE ARTHROSCOPY  6/14   right  . TONSILLECTOMY    . ULNAR NERVE TRANSPOSITION Right 05/17/2013   Procedure: RIGHT ULNAR NERVE DECOMPRESSION;  Surgeon: Wyn Forster., MD;  Location: [facility];  Service: Orthopedics;  Laterality: Right;  . WISDOM TOOTH  EXTRACTION      Home Medications:  Allergies as of 06/29/2017      Reactions   Penicillins    REACTION: unspecified      Medication List        Accurate as of 06/29/17  9:57 AM. Always use your most recent med list.          amLODipine 10 MG tablet Commonly known as:  NORVASC TAKE 1 TABLET BY MOUTH ONCE DAILY   buPROPion 150 MG 12 hr tablet Commonly known as:  WELLBUTRIN SR   celecoxib 100 MG capsule Commonly known as:  CELEBREX Take 1 capsule (100 mg total) by mouth 2 (two) times daily.   cetirizine 10 MG tablet Commonly known as:  ZYRTEC Take 10 mg by mouth daily.   FLUoxetine 40 MG capsule Commonly known as:  PROZAC TAKE 1 CAPSULE BY MOUTH ONCE DAILY   FREESTYLE LIBRE 14 DAY READER Devi 1 application by Does not apply route every 14 (fourteen) days.   FREESTYLE LIBRE 14 DAY SENSOR Misc 1 each by Does not apply route every 14 (fourteen) days.   glipiZIDE 10 MG tablet Commonly known as:  GLUCOTROL TAKE 1 TABLET BY MOUTH TWICE (2) DAILY BEFORE A MEAL   KLS ALLER-FLO 50 MCG/ACT nasal spray Generic drug:  fluticasone Place 2 sprays into both nostrils daily.   lamoTRIgine 100 MG tablet Commonly known as:  LAMICTAL TAKE 1 TABLET BY MOUTH EVERY MORNING   lisinopril 10 MG tablet Commonly known as:  PRINIVIL,ZESTRIL TAKE 1 TABLET BY MOUTH DAILY   MEGARED OMEGA-3 KRILL OIL PO Take 750 mg by mouth daily.   Naproxen Sodium 220 MG Caps Take 440 mg by mouth daily.   NF FORMULAS TESTOSTERONE Caps Take 1 capsule by mouth 2 (two) times daily.   pantoprazole 40 MG tablet Commonly known as:  PROTONIX TAKE 1 TABLET BY MOUTH ONCE DAILY   rosuvastatin 10 MG tablet Commonly known as:  CRESTOR TAKE 1 TABLET BY MOUTH ONCE DAILY   Vitamin D3 1000 units Caps Take 1 capsule by mouth daily.       Allergies:  Allergies  Allergen Reactions  . Penicillins     REACTION: unspecified    Family History: Family History  Problem Relation Age of Onset  . Cancer  Mother        lymphoma  . Heart disease Father   . Hypertension Father   . Diabetes Sister   . Stroke Paternal Uncle   . Diabetes Maternal Grandmother   . Arthritis Paternal Grandmother   . Heart disease Paternal Grandfather     Social History:  reports that he has never smoked. His smokeless tobacco use includes chew. He reports that he drinks alcohol. He reports that he does not use drugs.  ROS: UROLOGY Frequent Urination?: No Hard to postpone urination?: No Burning/pain with urination?: No Get up at night to urinate?: No Leakage of urine?: No Urine stream starts and stops?: No Trouble starting stream?: No Do you have to strain to urinate?: No Blood in urine?: No Urinary tract infection?: No Sexually transmitted disease?: No Injury to kidneys or bladder?: No Painful intercourse?: No Weak stream?: No Erection problems?: No Penile pain?: No  Gastrointestinal Nausea?: No Vomiting?: No Indigestion/heartburn?: No Diarrhea?: No Constipation?: No  Constitutional Fever: No Night sweats?: No Weight loss?: No Fatigue?: No  Skin Skin rash/lesions?: No Itching?: No  Eyes Blurred vision?: No Double vision?: No  Ears/Nose/Throat Sore throat?: No Sinus problems?: No  Hematologic/Lymphatic Swollen glands?: No Easy bruising?: No  Cardiovascular Leg swelling?: No Chest pain?: No  Respiratory Cough?: No Shortness of breath?: No  Endocrine Excessive thirst?: No  Musculoskeletal Back pain?: No Joint pain?: No  Neurological Headaches?: No Dizziness?: No  Psychologic Depression?: No Anxiety?: No  Physical Exam: BP (!) 156/66   Pulse 81   Resp 16   Ht 6' (1.829 m)   Wt (!) 355 lb 11.2 oz (161.3 kg)   SpO2 98%   BMI 48.24 kg/m   Constitutional:  Alert and oriented, No acute distress. HEENT: [place] AT, moist mucus membranes.  Trachea midline, no masses. Cardiovascular: No clubbing, cyanosis, or edema. Respiratory: Normal respiratory effort, no  increased work of breathing. GI: Abdomen is soft, nontender, nondistended, no abdominal masses GU: No CVA tenderness.  Penis without lesions.  Testes descended bilaterally without masses or tenderness.  Normal size bilaterally. Lymph: No cervical or inguinal lymphadenopathy. Skin: No rashes, bruises or suspicious lesions. Neurologic: Grossly intact, no focal deficits, moving all 4 extremities. Psychiatric: Normal mood and affect.  Laboratory Data: Lab Results  Component Value Date   WBC 5.0 05/24/2017   HGB 12.6 (L) 05/24/2017   HCT 37.7 (L) 05/24/2017   MCV 80.8 05/24/2017   PLT 222.0 05/24/2017    Lab Results  Component Value Date   CREATININE 1.07 05/24/2017    Lab Results  Component Value Date   PSA 0.58 08/09/2016    Lab Results  Component Value Date   TESTOSTERONE 174.04 (L) 05/24/2017  Lab Results  Component Value Date   HGBA1C 6.7 (H) 05/24/2017    Urinalysis    Component Value Date/Time   APPEARANCEUR Clear 06/29/2017 0914   GLUCOSEU 2+ (A) 06/29/2017 0914   BILIRUBINUR Negative 06/29/2017 0914   PROTEINUR Negative 06/29/2017 0914   NITRITE Negative 06/29/2017 0914   LEUKOCYTESUR Negative 06/29/2017 0914     Assessment & Plan:   52 year old male with tiredness and fatigue and low testosterone.  He has no decreased in libido and denies erectile dysfunction.  I discussed it is difficult to say if low testosterone is a cause of his symptom and the only way to really tell would be a 35-month trial of testosterone replacement therapy with reassessment of symptoms.  He was informed he would need to a.m. testosterone levels and will repeat today along with an LH and prolactin.  We discussed treatment options of topicals and injection therapy.  He was provided literature and will think over these options.  Potential side effects of testosterone replacement were discussed including stimulation of benign prostatic growth with lower urinary tract symptoms;  erythrocytosis; edema; gynecomastia; worsening sleep apnea; venous thromboembolism; testicular atrophy and infertility. Recent studies suggesting an increased incidence of heart attack and stroke in patients taking testosterone was discussed. He was informed there is conflicting evidence regarding the impact of testosterone therapy on cardiovascular risk. The theoretical risk of growth stimulation of an undetected prostate cancer was also discussed.  He was informed that current evidence does not provide any definitive answers regarding the risks of testosterone therapy on prostate cancer and cardiovascular disease. The need for periodic monitoring of his testosterone level, PSA, hematocrit and DRE was discussed.    Riki Altes, MD  [facility] [address] [place], [place] [address] [phone]

## 2017-06-30 LAB — LUTEINIZING HORMONE: LH: 5 m[IU]/mL (ref 1.7–8.6)

## 2017-06-30 LAB — PROLACTIN: Prolactin: 11.4 ng/mL (ref 4.0–15.2)

## 2017-06-30 LAB — TESTOSTERONE: Testosterone: 172 ng/dL — ABNORMAL LOW (ref 264–916)

## 2017-07-04 ENCOUNTER — Telehealth: Payer: Self-pay

## 2017-07-04 NOTE — Telephone Encounter (Signed)
-----   Message from Riki Altes, MD sent at 07/04/2017 11:16 AM EDT ----- Repeat T level remains low at 172. We can start testosterone replacement.  He was going to think about topicals vs injection

## 2017-07-05 ENCOUNTER — Other Ambulatory Visit: Payer: Self-pay | Admitting: Internal Medicine

## 2017-07-11 DIAGNOSIS — G4733 Obstructive sleep apnea (adult) (pediatric): Secondary | ICD-10-CM | POA: Diagnosis not present

## 2017-07-18 NOTE — Progress Notes (Signed)
Office Visit Note  Patient: Anthony Castillo             Date of Birth: 02/03/1966           MRN: [id number]             PCP: Jearld Fenton, NP Referring: Jearld Fenton, NP Visit Date: 08/01/2017 Occupation: [employer]    Subjective:  Pain in multiple joints  History of Present Illness: [name] is a 52 y.o. male seen in consultation per request of his PCP.  According to patient his symptoms are started approximately 10 years ago with pain in multiple joints he complains of pain in his bilateral elbows, bilateral hands, bilateral knee joints and his feet.  He is also had lower back pain for multiple years.  He states recently has been having increased pain in his knee joints and ankle joints in the swell.  He is seen several physicians over the years and has tried topical agents and oral anti-inflammatories.  Currently he is taking Celebrex.  Activities of Daily Living:  Patient reports morning stiffness for 1  hour.   Patient Reports nocturnal pain.  Difficulty dressing/grooming: Denies Difficulty climbing stairs: Reports Difficulty getting out of chair: Reports Difficulty using hands for taps, buttons, cutlery, and/or writing: Denies   Review of Systems  Constitutional: Positive for fatigue. Negative for night sweats.  HENT: Negative for mouth sores, trouble swallowing, trouble swallowing, mouth dryness and nose dryness.   Eyes: Positive for redness. Negative for visual disturbance and dryness.  Respiratory: Negative for cough, hemoptysis, shortness of breath and difficulty breathing.   Cardiovascular: Negative for chest pain, palpitations, hypertension, irregular heartbeat and swelling in legs/feet.  Gastrointestinal: Positive for constipation. Negative for blood in stool and diarrhea.  Endocrine: Negative for increased urination.  Genitourinary: Negative for painful urination.  Musculoskeletal: Positive for arthralgias, joint pain, joint swelling and morning stiffness.  Negative for myalgias, muscle weakness, muscle tenderness and myalgias.  Skin: Negative for color change, rash, hair loss, nodules/bumps, skin tightness, ulcers and sensitivity to sunlight.  Allergic/Immunologic: Negative for susceptible to infections.  Neurological: Negative for dizziness, fainting, memory loss, night sweats and weakness.  Hematological: Negative for swollen glands.  Psychiatric/Behavioral: Positive for depressed mood and sleep disturbance. The patient is nervous/anxious.     PMFS History:  Patient Active Problem List   Diagnosis Date Noted  . Pure hypercholesterolemia 08/09/2016  . DM (diabetes mellitus), type 2 ([place]) 04/29/2016  . GERD (gastroesophageal reflux disease) 04/29/2016  . Seasonal allergies 04/29/2016  . Osteoarthritis 04/09/2011  . Hypogonadism male 04/09/2011  . ERECTILE DYSFUNCTION 04/25/2007  . Depression, major, single episode, in partial remission ([place]) 03/16/2007  . Sleep apnea 11/29/2006  . Morbid obesity ([place]) 09/22/2006  . Essential hypertension 09/22/2006    Past Medical History:  Diagnosis Date  . Chronic pain 04/09/2011  . DEPRESSION 03/16/2007  . GERD (gastroesophageal reflux disease)   . HYPERTENSION 09/22/2006  . Hypogonadism male 04/09/2011  . Morbid obesity ([place]) 09/22/2006  . Osteoarthritis 04/09/2011  . Sleep apnea    uses a cpap-will use after surgery  . Wears glasses     Family History  Problem Relation Age of Onset  . Cancer Mother        lymphoma  . Heart disease Father   . Hypertension Father   . Diabetes Sister   . Stroke Paternal Uncle   . Diabetes Maternal Grandmother   . Arthritis Paternal Grandmother   . Heart disease Paternal  Grandfather   . Migraines Daughter   . Irritable bowel syndrome Daughter   . Heart Problems Son        valve    Past Surgical History:  Procedure Laterality Date  . CARPAL TUNNEL RELEASE Right 05/17/2013   Procedure: RIGHT CARPAL TUNNEL RELEASE;  Surgeon: Cammie Sickle., MD;   Location: [facility];  Service: Orthopedics;  Laterality: Right;  . CARPAL TUNNEL RELEASE Left 07/19/2013   Procedure: LEFT CARPAL TUNNEL RELEASE;  Surgeon: Cammie Sickle., MD;  Location: [facility];  Service: Orthopedics;  Laterality: Left;  . FOOT SURGERY Right 01/2015  . KNEE ARTHROSCOPY  6/14   right  . TONSILLECTOMY    . ULNAR NERVE TRANSPOSITION Right 05/17/2013   Procedure: RIGHT ULNAR NERVE DECOMPRESSION;  Surgeon: Cammie Sickle., MD;  Location: [facility];  Service: Orthopedics;  Laterality: Right;  . WISDOM TOOTH EXTRACTION     Social History   Social History Narrative  . Not on file     Objective: Vital Signs: BP 131/76 (BP Location: Left Arm, Patient Position: Sitting, Cuff Size: Large)   Pulse 77   Resp 18   Ht _0  (1.854 m)   Wt (!) 360 lb (163.3 kg)   BMI 47.50 kg/m    Physical Exam  Constitutional: He is oriented to person, place, and time. He appears well-developed and well-nourished.  HENT:  Head: Normocephalic and atraumatic.  Eyes: Pupils are equal, round, and reactive to light. Conjunctivae and EOM are normal.  Neck: Normal range of motion. Neck supple.  Cardiovascular: Normal rate, regular rhythm and normal heart sounds.  Pulmonary/Chest: Effort normal and breath sounds normal.  Abdominal: Soft. Bowel sounds are normal.  Neurological: He is alert and oriented to person, place, and time.  Skin: Skin is warm and dry. Capillary refill takes less than 2 seconds.  Psychiatric: He has a normal mood and affect. His behavior is normal.  Nursing note and vitals reviewed.    Musculoskeletal Exam: C-spine and thoracic spine good range of motion.  He has discomfort and limitation of range of motion of his lumbar spine.  Shoulder joints elbow joints wrist joint MCPs PIPs DIPs with good range of motion.  He is tenderness across his PIPs and DIP joints in his hands.  Some DIP thickening was noted.  He had good  range of motion of bilateral knee joints.  He has discomfort range of motion of bilateral knee joints.  There was some warmth on palpation of his knee joints without any effusion.  There was tenderness across his ankles and MTPs.  No synovitis was noted.  CDAI Exam: No CDAI exam completed.    Investigation: Findings:  05/24/17: Lyme -, RMSF-, RF <14, CRP 2.5, Sed rate 24, ANA -, B12 381, testosterone 174.04 (L), LH 5.0, Prolactin 11.4, Vitamin D 35.54, TSH 1.80  Component     Latest Ref Rng & Units 05/24/2017  B burgdorferi IgG Abs (IB)     NEGATIVE NEGATIVE  Lyme Disease 18 kD IgG      NON-REACTIVE  Lyme Disease 23 kD IgG      NON-REACTIVE  Lyme Disease 28 kD IgG      NON-REACTIVE  Lyme Disease 30 kD IgG      NON-REACTIVE  Lyme Disease 39 kD IgG      NON-REACTIVE  Lyme Disease 41 kD IgG      NON-REACTIVE  Lyme Disease 45 kD IgG  NON-REACTIVE  Lyme Disease 58 kD IgG      NON-REACTIVE  Lyme Disease 66 kD IgG      NON-REACTIVE  Lyme Disease 93 kD IgG      NON-REACTIVE  B burgdorferi IgM Abs (IB)     NEGATIVE NEGATIVE  Lyme Disease 23 kD IgM      NON-REACTIVE  Lyme Disease 39 kD IgM      NON-REACTIVE  Lyme Disease 41 kD IgM      NON-REACTIVE  E. CHAFFEENSIS AB IGG      <1:64  E. CHAFFEENSIS AB IGM      <1:20  Interpretation        RMSF, IgG, EIA     NOT DETECT NOT DETECTED  [place] Spotted Fever, IgM     NOT DETECT NOT DETECTED  RA Latex Turbid.     <14 IU/mL <14  C-Reactive Protein, Cardiac     0.000 - 5.000 mg/L 2.570  Sed Rate     0 - 20 mm/hr 24 (H)  Anit Nuclear Antibody(ANA)     NEGATIVE NEGATIVE  VITD     30.00 - 100.00 ng/mL 35.54   Component     Latest Ref Rng & Units 05/24/2017  Vitamin B12     211 - 911 pg/mL 381  Testosterone     300.00 - 890.00 ng/dL 174.04 (L)  Hemoglobin A1C     4.6 - 6.5 % 6.7 (H)  TSH     0.35 - 4.50 uIU/mL 1.80   Component     Latest Ref Rng & Units 06/29/2017  LH     1.7 - 8.6 mIU/mL 5.0  Prolactin      4.0 - 15.2 ng/mL 11.4    Imaging: Xr Foot 2 Views Left  Result Date: 08/01/2017 His screws noted in the calcaneum and the first metatarsal.  Subluxation of some MTPs was noted.  No erosive changes were noted.  PIP and DIP narrowing was noted. Impression: These findings are consistent with osteoarthritis of the foot.  Xr Foot 2 Views Right  Result Date: 08/01/2017 First MTP, all PIP and DIP narrowing was noted.  No erosive changes were noted.  Calcaneal spurs were noted. Impression: These findings are consistent with osteoarthritis of the foot.  Xr Hand 2 View Left  Result Date: 08/01/2017 PIP and DIP narrowing was noted.  No MCP joint narrowing was noted.  No intercarpal radiocarpal joint space narrowing was noted.  Mild CMC narrowing was noted. Impression: These findings are consistent with osteoarthritis of the hand.  Xr Hand 2 View Right  Result Date: 08/01/2017 PIP and DIP narrowing was noted.  No MCP joint narrowing was noted.  No intercarpal radiocarpal joint space narrowing was noted.  Mild CMC narrowing was noted. Impression: These findings are consistent with osteoarthritis of the hand.  Xr Knee 3 View Left  Result Date: 08/01/2017 Moderate medial compartment narrowing was noted.  No chondrocalcinosis was noted.  Severe patellofemoral narrowing was noted. Impression: These findings are consistent with moderate osteoarthritis and severe chondromalacia patella.  Xr Knee 3 View Right  Result Date: 08/01/2017 Severe medial compartment narrowing was noted.  Severe patellofemoral narrowing was noted.  Impression: These findings are consistent with severe osteoarthritis and severe chondromalacia patella.  Xr Lumbar Spine 2-3 Views  Result Date: 08/01/2017 No significant disc space narrowing was noted.  Anterior spurring was noted.  Facet joint arthropathy was noted. Impression: These findings are consistent with facet joint arthropathy.   Speciality Comments:  No specialty comments  available.    Procedures:  No procedures performed Allergies: Penicillins   Assessment / Plan:     Visit Diagnoses: Polyarthralgia -patient complains of polyarthralgia for multiple years.  He had some labs done by his PCP recently which were unremarkable except for mildly elevated ESR and listed as follows.  Lyme -, RMSF-, RF <14, CRP 2.5, Sed rate 24, ANA -, B12 381, testosterone 174.04 (L), LH 5.0, Prolactin 11.4, Vitamin D 35.54, TSH 1.80  Pain in both hands -he has been complaining of pain and discomfort in his bilateral hands and elbows.  He had some clinical findings consistent with osteoarthritis.  I did not appreciate any synovitis.  Plan: XR Hand 2 View Right, XR Hand 2 View Left.  The x-rays were consistent with osteoarthritis no erosive changes were noted.  No MCP or intercarpal joint space narrowing was noted.  Chronic pain of both knees -he had warmth on palpation of bilateral knee joints.  Plan: XR KNEE 3 VIEW RIGHT, XR KNEE 3 VIEW LEFT right knee joint showed moderate to severe and left moderate medial compartment narrowing.  Bilateral severe chondromalacia patella was noted.  Joint protection muscle strengthening and weight loss was discussed.  Pain in both feet -he had tenderness on palpation of bilateral ankle joints in his feet.  Plan: XR Foot 2 Views Right, XR Foot 2 Views Left hardware was noted in the left foot in the calcaneum and first metatarsal.  The x-ray findings were consistent with osteoarthritis.  He also had subluxation of most of his MTPs.  No erosive changes were noted.  Chronic midline low back pain without sciatica -hip painful limited range of motion of his lumbar spine.  Plan: XR Lumbar Spine 2-3 Views  Essential hypertension-his blood pressure is controlled today.  Pure hypercholesterolemia  History of type 2 diabetes mellitus  History of depression  History of gastroesophageal reflux (GERD)  History of sleep apnea  Family history of rheumatoid  arthritis - Father and paternal grandmother according to patient.    Orders: Orders Placed This Encounter  Procedures  . XR Hand 2 View Right  . XR Hand 2 View Left  . XR KNEE 3 VIEW RIGHT  . XR KNEE 3 VIEW LEFT  . XR Foot 2 Views Right  . XR Foot 2 Views Left  . XR Lumbar Spine 2-3 Views  . Uric acid  . Cyclic citrul peptide antibody, IgG  . 14-3-3 eta Protein   No orders of the defined types were placed in this encounter.   Face-to-face time spent with patient was 50 minutes.> 50% of time was spent in counseling and coordination of care.  Follow-Up Instructions: Return for Osteoarthritis.   Bo Merino, MD  Note - This record has been created using Editor, commissioning.  Chart creation errors have been sought, but may not always  have been located. Such creation errors do not reflect on  the standard of medical care.

## 2017-07-26 ENCOUNTER — Other Ambulatory Visit: Payer: Self-pay | Admitting: Internal Medicine

## 2017-08-01 ENCOUNTER — Ambulatory Visit (INDEPENDENT_AMBULATORY_CARE_PROVIDER_SITE_OTHER): Payer: Self-pay

## 2017-08-01 ENCOUNTER — Ambulatory Visit: Payer: BLUE CROSS/BLUE SHIELD | Admitting: Rheumatology

## 2017-08-01 ENCOUNTER — Encounter: Payer: Self-pay | Admitting: Rheumatology

## 2017-08-01 VITALS — BP 131/76 | HR 77 | Resp 18 | Ht 73.0 in | Wt 360.0 lb

## 2017-08-01 DIAGNOSIS — M2550 Pain in unspecified joint: Secondary | ICD-10-CM | POA: Diagnosis not present

## 2017-08-01 DIAGNOSIS — M25562 Pain in left knee: Secondary | ICD-10-CM | POA: Diagnosis not present

## 2017-08-01 DIAGNOSIS — Z8669 Personal history of other diseases of the nervous system and sense organs: Secondary | ICD-10-CM

## 2017-08-01 DIAGNOSIS — M545 Low back pain, unspecified: Secondary | ICD-10-CM

## 2017-08-01 DIAGNOSIS — Z8639 Personal history of other endocrine, nutritional and metabolic disease: Secondary | ICD-10-CM | POA: Diagnosis not present

## 2017-08-01 DIAGNOSIS — Z8261 Family history of arthritis: Secondary | ICD-10-CM

## 2017-08-01 DIAGNOSIS — Z8719 Personal history of other diseases of the digestive system: Secondary | ICD-10-CM

## 2017-08-01 DIAGNOSIS — M79642 Pain in left hand: Secondary | ICD-10-CM | POA: Diagnosis not present

## 2017-08-01 DIAGNOSIS — E7800 Pure hypercholesterolemia, unspecified: Secondary | ICD-10-CM

## 2017-08-01 DIAGNOSIS — M79641 Pain in right hand: Secondary | ICD-10-CM

## 2017-08-01 DIAGNOSIS — I10 Essential (primary) hypertension: Secondary | ICD-10-CM | POA: Diagnosis not present

## 2017-08-01 DIAGNOSIS — Z8659 Personal history of other mental and behavioral disorders: Secondary | ICD-10-CM | POA: Diagnosis not present

## 2017-08-01 DIAGNOSIS — M25561 Pain in right knee: Secondary | ICD-10-CM | POA: Diagnosis not present

## 2017-08-01 DIAGNOSIS — M79671 Pain in right foot: Secondary | ICD-10-CM | POA: Diagnosis not present

## 2017-08-01 DIAGNOSIS — G8929 Other chronic pain: Secondary | ICD-10-CM

## 2017-08-01 DIAGNOSIS — M79672 Pain in left foot: Secondary | ICD-10-CM | POA: Diagnosis not present

## 2017-08-03 ENCOUNTER — Other Ambulatory Visit: Payer: Self-pay | Admitting: Internal Medicine

## 2017-08-04 LAB — 14-3-3 ETA PROTEIN: 14-3-3 eta Protein: <0.2 ng/mL (ref <0.2)

## 2017-08-04 LAB — CYCLIC CITRUL PEPTIDE ANTIBODY, IGG: Cyclic Citrullin Peptide Ab: <16 UNITS

## 2017-08-04 LAB — URIC ACID: Uric Acid, Serum: 3.4 mg/dL — ABNORMAL LOW (ref 4.0–8.0)

## 2017-08-05 NOTE — Progress Notes (Signed)
WNL

## 2017-08-11 ENCOUNTER — Encounter: Payer: BLUE CROSS/BLUE SHIELD | Admitting: Internal Medicine

## 2017-08-11 DIAGNOSIS — G4733 Obstructive sleep apnea (adult) (pediatric): Secondary | ICD-10-CM | POA: Diagnosis not present

## 2017-08-23 ENCOUNTER — Encounter: Payer: Self-pay | Admitting: Internal Medicine

## 2017-08-23 ENCOUNTER — Ambulatory Visit (INDEPENDENT_AMBULATORY_CARE_PROVIDER_SITE_OTHER): Payer: BLUE CROSS/BLUE SHIELD | Admitting: Internal Medicine

## 2017-08-23 VITALS — BP 146/84 | HR 76 | Temp 98.5°F | Ht 73.0 in | Wt 357.0 lb

## 2017-08-23 DIAGNOSIS — I10 Essential (primary) hypertension: Secondary | ICD-10-CM

## 2017-08-23 DIAGNOSIS — K219 Gastro-esophageal reflux disease without esophagitis: Secondary | ICD-10-CM

## 2017-08-23 DIAGNOSIS — Z125 Encounter for screening for malignant neoplasm of prostate: Secondary | ICD-10-CM

## 2017-08-23 DIAGNOSIS — H60332 Swimmer's ear, left ear: Secondary | ICD-10-CM

## 2017-08-23 DIAGNOSIS — L280 Lichen simplex chronicus: Secondary | ICD-10-CM | POA: Diagnosis not present

## 2017-08-23 DIAGNOSIS — L989 Disorder of the skin and subcutaneous tissue, unspecified: Secondary | ICD-10-CM

## 2017-08-23 DIAGNOSIS — L080 Pyoderma: Secondary | ICD-10-CM

## 2017-08-23 DIAGNOSIS — E119 Type 2 diabetes mellitus without complications: Secondary | ICD-10-CM

## 2017-08-23 DIAGNOSIS — M159 Polyosteoarthritis, unspecified: Secondary | ICD-10-CM

## 2017-08-23 DIAGNOSIS — Z0001 Encounter for general adult medical examination with abnormal findings: Secondary | ICD-10-CM

## 2017-08-23 DIAGNOSIS — E559 Vitamin D deficiency, unspecified: Secondary | ICD-10-CM

## 2017-08-23 DIAGNOSIS — M150 Primary generalized (osteo)arthritis: Secondary | ICD-10-CM

## 2017-08-23 DIAGNOSIS — E7800 Pure hypercholesterolemia, unspecified: Secondary | ICD-10-CM

## 2017-08-23 DIAGNOSIS — H6123 Impacted cerumen, bilateral: Secondary | ICD-10-CM

## 2017-08-23 DIAGNOSIS — E291 Testicular hypofunction: Secondary | ICD-10-CM

## 2017-08-23 DIAGNOSIS — F324 Major depressive disorder, single episode, in partial remission: Secondary | ICD-10-CM

## 2017-08-23 DIAGNOSIS — G4733 Obstructive sleep apnea (adult) (pediatric): Secondary | ICD-10-CM | POA: Diagnosis not present

## 2017-08-23 DIAGNOSIS — M8949 Other hypertrophic osteoarthropathy, multiple sites: Secondary | ICD-10-CM

## 2017-08-23 LAB — LIPID PANEL
Cholesterol: 121 mg/dL (ref 0–200)
HDL: 29 mg/dL — ABNORMAL LOW (ref >39.00)
NonHDL: 91.84
Total CHOL/HDL Ratio: 4
Triglycerides: 201 mg/dL — ABNORMAL HIGH (ref 0.0–149.0)
VLDL: 40.2 mg/dL — ABNORMAL HIGH (ref 0.0–40.0)

## 2017-08-23 LAB — COMPREHENSIVE METABOLIC PANEL
ALT: 33 U/L (ref 0–53)
AST: 32 U/L (ref 0–37)
Albumin: 4.4 g/dL (ref 3.5–5.2)
Alkaline Phosphatase: 89 U/L (ref 39–117)
BUN: 12 mg/dL (ref 6–23)
CO2: 28 mEq/L (ref 19–32)
Calcium: 9.2 mg/dL (ref 8.4–10.5)
Chloride: 101 mEq/L (ref 96–112)
Creatinine, Ser: 1.03 mg/dL (ref 0.40–1.50)
GFR: 80 mL/min (ref >60.00)
Glucose, Bld: 118 mg/dL — ABNORMAL HIGH (ref 70–99)
Potassium: 4.1 mEq/L (ref 3.5–5.1)
Sodium: 137 mEq/L (ref 135–145)
Total Bilirubin: 0.7 mg/dL (ref 0.2–1.2)
Total Protein: 6.7 g/dL (ref 6.0–8.3)

## 2017-08-23 LAB — CBC
HCT: 37.8 % — ABNORMAL LOW (ref 39.0–52.0)
Hemoglobin: 12.7 g/dL — ABNORMAL LOW (ref 13.0–17.0)
MCHC: 33.5 g/dL (ref 30.0–36.0)
MCV: 79.5 fl (ref 78.0–100.0)
Platelets: 202 10*3/uL (ref 150.0–400.0)
RBC: 4.75 Mil/uL (ref 4.22–5.81)
RDW: 14.7 % (ref 11.5–15.5)
WBC: 4.5 10*3/uL (ref 4.0–10.5)

## 2017-08-23 LAB — LDL CHOLESTEROL, DIRECT: Direct LDL: 59 mg/dL

## 2017-08-23 LAB — VITAMIN D 25 HYDROXY (VIT D DEFICIENCY, FRACTURES): VITD: 37.3 ng/mL (ref 30.00–100.00)

## 2017-08-23 LAB — HEMOGLOBIN A1C: Hgb A1c MFr Bld: 7.1 % — ABNORMAL HIGH (ref 4.6–6.5)

## 2017-08-23 LAB — PSA: PSA: 1.42 ng/mL (ref 0.10–4.00)

## 2017-08-23 MED ORDER — CIPROFLOXACIN-DEXAMETHASONE 0.3-0.1 % OT SUSP
4.0000 [drp] | Freq: Two times a day (BID) | OTIC | 0 refills | Status: AC
Start: 2017-08-23 — End: 2017-08-28

## 2017-08-23 NOTE — Patient Instructions (Signed)

## 2017-08-23 NOTE — Progress Notes (Signed)
Subjective:    Patient ID: Anthony Castillo, male    DOB: 11-29-1965, 52 y.o.   MRN: [id number]  HPI  Pt presents to the clinic today for his annual exam. He is also due to follow up chronic conditions.  HTN: His BP today is 146/84. He is taking Amlodipine and Lisinopril as prescribed. ECG from 04/2013 reviewed.  DM 2: His last A1C was 7.2, 07/2016. He is taking Glipizide as prescribed. He does not monitor his sugars. He does not check his feet.  Chronic Pain: He is currently following with Dr. Durenda Age for multiple joint pain. He is taking Celebrex but does not feel like it is helpful.   Depression: Chronic dysthymia. He is taking Fluoxetine, Wellbutrin and Lamictal as prescribed. He no longer follows with psychiatry. He denies SI/HI.  GERD: He denies breakthrough on Pantoprazole.   Hypogonadism: He is no longer taking Testosterone supplements. He reports fatigue but denies sexual dysfunction.  HLD: His last LDL was 53, 07/2016. He is taking Rosuvastatin and Fish Oil as prescribed. He does not consume a low fat diet.  OSA: He averages 6-7 hours of sleep per night with the use of his CPAP. He does feel rested when he wakes up. He does not nap during the day.  He also c/o intermittent hearing loss. He reports this started a few weeks ago. [place] the [place] weekend and his ears never popped with the change in elevation. He feels like he is talking in a barrel. He denies ear pain or drainage.  He also reports bumps to the back of his neck. He noticed this 1 year ago. He has not gotten treatment for this. They do not itch or burn.  Flu: 11/2016 Tetanus: 07/2016 Pneumovax: 07/2016 PSA Screening: 07/2016 Colon Screening: 04/2009, [facility] Screening: annually, [facility] Dentist: as needed, dentures  Diet: He does eat meat. He consumes fruits and vegies daily. He does eat fried foods. He drinks mostly sweet tea, pepsi, water. Exercise: farming 7 days per week  Review of  Systems      Past Medical History:  Diagnosis Date  . Chronic pain 04/09/2011  . DEPRESSION 03/16/2007  . GERD (gastroesophageal reflux disease)   . HYPERTENSION 09/22/2006  . Hypogonadism male 04/09/2011  . Morbid obesity ([facility]) 09/22/2006  . Osteoarthritis 04/09/2011  . Sleep apnea    uses a cpap-will use after surgery  . Wears glasses     Current Outpatient Medications  Medication Sig Dispense Refill  . Acetaminophen (TYLENOL ARTHRITIS PAIN PO) Take by mouth as needed.    Marland Kitchen amLODipine (NORVASC) 10 MG tablet TAKE 1 TABLET BY MOUTH ONCE DAILY 90 tablet 1  . buPROPion (WELLBUTRIN SR) 150 MG 12 hr tablet   0  . celecoxib (CELEBREX) 100 MG capsule Take 1 capsule (100 mg total) by mouth 2 (two) times daily. 60 capsule 2  . cetirizine (ZYRTEC) 10 MG tablet Take 10 mg by mouth daily.    . Cholecalciferol (VITAMIN D3) 1000 units CAPS Take 1 capsule by mouth daily.    . Continuous Blood Gluc Receiver (FREESTYLE LIBRE 14 DAY READER) DEVI 1 application by Does not apply route every 14 (fourteen) days. (Patient not taking: Reported on 08/01/2017) 2 Device 3  . Continuous Blood Gluc Sensor (FREESTYLE LIBRE 14 DAY SENSOR) MISC 1 each by Does not apply route every 14 (fourteen) days. (Patient not taking: Reported on 08/01/2017) 2 each 2  . FLUoxetine (PROZAC) 40 MG capsule TAKE 1 CAPSULE BY  MOUTH ONCE DAILY 90 capsule 1  . fluticasone (KLS ALLER-FLO) 50 MCG/ACT nasal spray Place 2 sprays into both nostrils as needed.     Marland Kitchen. glipiZIDE (GLUCOTROL) 10 MG tablet TAKE 1 TABLET BY MOUTH TWICE DAILY BEFORE A MEAL 60 tablet 0  . lamoTRIgine (LAMICTAL) 100 MG tablet TAKE 1 TABLET BY MOUTH EVERY MORNING 30 tablet 1  . lisinopril (PRINIVIL,ZESTRIL) 10 MG tablet TAKE 1 TABLET BY MOUTH DAILY 90 tablet 3  . MEGARED OMEGA-3 KRILL OIL PO Take 750 mg by mouth daily.    . Misc Natural Products (NF FORMULAS TESTOSTERONE) CAPS Take 1 capsule by mouth 2 (two) times daily.    . pantoprazole (PROTONIX) 40 MG tablet TAKE 1 TABLET BY  MOUTH ONCE DAILY 30 tablet 1  . rosuvastatin (CRESTOR) 10 MG tablet TAKE 1 TABLET BY MOUTH ONCE A DAY 30 tablet 0   No current facility-administered medications for this visit.     Allergies  Allergen Reactions  . Penicillins     REACTION: unspecified    Family History  Problem Relation Age of Onset  . Cancer Mother        lymphoma  . Heart disease Father   . Hypertension Father   . Diabetes Sister   . Stroke Paternal Uncle   . Diabetes Maternal Grandmother   . Arthritis Paternal Grandmother   . Heart disease Paternal Grandfather   . Migraines Daughter   . Irritable bowel syndrome Daughter   . Heart Problems Son        valve     Social History   Socioeconomic History  . Marital status: Married    Spouse name: Not on file  . Number of children: Not on file  . Years of education: Not on file  . Highest education level: Not on file  Occupational History  . Not on file  Social Needs  . Financial resource strain: Not on file  . Food insecurity:    Worry: Not on file    Inability: Not on file  . Transportation needs:    Medical: Not on file    Non-medical: Not on file  Tobacco Use  . Smoking status: Never Smoker  . Smokeless tobacco: Current User    Types: Chew  Substance and Sexual Activity  . Alcohol use: Yes    Comment: rare  . Drug use: Never  . Sexual activity: Yes  Lifestyle  . Physical activity:    Days per week: Not on file    Minutes per session: Not on file  . Stress: Not on file  Relationships  . Social connections:    Talks on phone: Not on file    Gets together: Not on file    Attends religious service: Not on file    Active member of club or organization: Not on file    Attends meetings of clubs or organizations: Not on file    Relationship status: Not on file  . Intimate partner violence:    Fear of current or ex partner: Not on file    Emotionally abused: Not on file    Physically abused: Not on file    Forced sexual activity: Not on  file  Other Topics Concern  . Not on file  Social History Narrative  . Not on file     Constitutional: Pt reports fatigue. Denies fever, malaise, headache or abrupt weight changes.  HEENT: Pt reports decreased hearing. Denies eye pain, eye redness, ear pain, ringing in the ears,  wax buildup, runny nose, nasal congestion, bloody nose, or sore throat. Respiratory: Denies difficulty breathing, shortness of breath, cough or sputum production.   Cardiovascular: Denies chest pain, chest tightness, palpitations or swelling in the hands or feet.  Gastrointestinal: Pt reports intermittent constipation. Denies abdominal pain, bloating, diarrhea or blood in the stool.  GU: Denies urgency, frequency, pain with urination, burning sensation, blood in urine, odor or discharge. Musculoskeletal: Pt reports chronic joint pain. Denies decrease in range of motion, difficulty with gait, muscle pain or joint swelling.  Skin: Pt reports bumps to back of neck. Denies redness, rashes, or ulcercations.  Neurological: Pt reports burning sensation in feet. Denies dizziness, difficulty with memory, difficulty with speech or problems with balance and coordination.  Psych: Pt has history of depression. Denies anxiety, SI/HI.  No other specific complaints in a complete review of systems (except as listed in HPI above).  Objective:   Physical Exam   BP (!) 146/84   Pulse 76   Temp 98.5 F (36.9 C) (Oral)   Ht 6\' 1"  (1.854 m)   Wt (!) 357 lb (161.9 kg)   SpO2 98%   BMI 47.10 kg/m  Wt Readings from Last 3 Encounters:  08/23/17 (!) 357 lb (161.9 kg)  08/01/17 (!) 360 lb (163.3 kg)  06/29/17 (!) 355 lb 11.2 oz (161.3 kg)    General: Appears his stated age, obese  in NAD. Skin: Grouped nodular lesions noted to posterior neck. No ulceration noted of BLE. HEENT: Head: normal shape and size; Eyes: sclera white, no icterus, conjunctiva pink, PERRLA and EOMs intact; Ears: bilateral cerumen impaction;  Throat/Mouth:  Teeth present, mucosa pink and moist, no exudate, lesions or ulcerations noted.  Neck:  Neck supple, trachea midline. No masses, lumps or thyromegaly present.  Cardiovascular: Normal rate and rhythm. S1,S2 noted.  No murmur, rubs or gallops noted. Trace BLE edema. No carotid bruits noted. Pulmonary/Chest: Normal effort and positive vesicular breath sounds. No respiratory distress. No wheezes, rales or ronchi noted.  Abdomen: Soft and nontender. Normal bowel sounds. Ventral hernia noted. Musculoskeletal: Strength 5/5 BUE/BLE. No difficulty with gait.  Neurological: Alert and oriented. Cranial nerves II-XII grossly intact. Coordination normal.  Psychiatric: Mood and affect normal. Behavior is normal. Judgment and thought content normal.    BMET    Component Value Date/Time   NA 137 08/23/2017 1024   K 4.1 08/23/2017 1024   CL 101 08/23/2017 1024   CO2 28 08/23/2017 1024   GLUCOSE 118 (H) 08/23/2017 1024   BUN 12 08/23/2017 1024   CREATININE 1.03 08/23/2017 1024   CALCIUM 9.2 08/23/2017 1024   GFRNONAA >90 05/15/2013 0922   GFRAA >90 05/15/2013 0922    Lipid Panel     Component Value Date/Time   CHOL 121 08/23/2017 1024   TRIG 201.0 (H) 08/23/2017 1024   HDL 29.00 (L) 08/23/2017 1024   CHOLHDL 4 08/23/2017 1024   VLDL 40.2 (H) 08/23/2017 1024   LDLCALC 74 12/05/2006 0846    CBC    Component Value Date/Time   WBC 4.5 08/23/2017 1024   RBC 4.75 08/23/2017 1024   HGB 12.7 (L) 08/23/2017 1024   HCT 37.8 (L) 08/23/2017 1024   PLT 202.0 08/23/2017 1024   MCV 79.5 08/23/2017 1024   MCHC 33.5 08/23/2017 1024   RDW 14.7 08/23/2017 1024    Hgb A1C Lab Results  Component Value Date   HGBA1C 7.1 (H) 08/23/2017           Assessment & Plan:  Preventative Health Maintenance:  Encouraged him to get a flu shot in the fall Tetanus UTD Pneumovax UTD Colon screening UTD Encouraged him to consume a balanced diet and exercise regimen Advised him to see an eye doctor and  dentist annually Will check CBC, CMET, Lipid, A1C, PSA and Vit D today  Skin Lesion of Neck:  Biopsied and sent for pathology (see procedure note)  Procedure Note:  Discussed risks of procedure including bleeding, infection, need for clearing of additional borders Informed consent obtained verbally Pt prepped, area cleansed with Betadine x 3 Area numbed with 0.5 mL 1% Lidiocaine with Epi Lesion excised with shave biopsy Area cauterized Covered with TAB, bandaid Pt tolerated procedure well  Hearing Loss secondary to Bilateral Cerumen Impaction:  Manual lavage by CMA  Advised him to try Debrox 2 x week to prevent wax buildup Left ear canal red with pus in canal- eRx for Ciprodex x 5 days  Will follow up after labs are back, return precautions discussed Nicki Reaper, NP

## 2017-08-24 DIAGNOSIS — L280 Lichen simplex chronicus: Secondary | ICD-10-CM | POA: Diagnosis not present

## 2017-08-24 DIAGNOSIS — L080 Pyoderma: Secondary | ICD-10-CM | POA: Diagnosis not present

## 2017-08-25 ENCOUNTER — Encounter: Payer: Self-pay | Admitting: *Deleted

## 2017-08-25 ENCOUNTER — Telehealth: Payer: Self-pay | Admitting: Internal Medicine

## 2017-08-25 ENCOUNTER — Encounter: Payer: Self-pay | Admitting: Internal Medicine

## 2017-08-25 NOTE — Assessment & Plan Note (Signed)
Discussed avoiding foods that trigger reflux Discussed how weight loss can help improve reflux Continue Pantoprazole for now CBC and CMET today

## 2017-08-25 NOTE — Telephone Encounter (Signed)
Copied from CRM [phone]#[id number]. Topic: Quick Communication - See Telephone Encounter >> Aug 25, 2017  3:17 PM Lorrine KinMcGee, Emillee Talsma B, NT wrote: CRM for notification. See Telephone encounter for: 08/25/17. Patient's wife calling and states that the ciprofloxacin-dexamethasone (CIPRODEX) OTIC suspension drops are going to be over $200. Insurance would not cover these. Would like to know if there is an alternate medication that could be sent in place. CB#: [phone] [facility] - [place] - [address]

## 2017-08-25 NOTE — Assessment & Plan Note (Signed)
Elevated today Reinforced DASH diet and exercise for weight loss Continue Amlodipine and Lisinopril, consider increasing to Lisinopril to 20 mg at next visit if remains elevated CBC and CMET today

## 2017-08-25 NOTE — Telephone Encounter (Addendum)
I spoke with Mrs Anthony Castillo and she has not seen pt all day so she is not sure if any pain or drainage.  I spoke with pt and he is not having any ear drainage or pain. Lt ear that Anthony Baity NP was going to treat is not giving any problems now but the hearing in the rt ear still comes and goes. [facility]. I spoke with Anthony Hurry Baity NP and she said to hold off on getting Ciprodex for now; pt is to use flonase daily and update Anthony Baity NP in 2 weeks about ears. Pt voiced understanding. I called Anthony Castillo and [facility] to let him know as well. FYI to Anthony Hurry Baity NP.

## 2017-08-25 NOTE — Assessment & Plan Note (Signed)
No longer on Testosterone Will not check levels unless things deteriorates

## 2017-08-25 NOTE — Assessment & Plan Note (Signed)
Encouraged weight loss Continue CPAP for now 

## 2017-08-25 NOTE — Assessment & Plan Note (Signed)
CMET and Lipid profile today Encouraged him to consume a low fat diet Continue Rosuvastatin and Fish Oil

## 2017-08-25 NOTE — Telephone Encounter (Signed)
Is he having any ear pain or drainage? He may not need the Ciprodex

## 2017-08-25 NOTE — Assessment & Plan Note (Signed)
A1C today No microalbumin secondary to ACEI therapy Encouraged him to consume a low carb diet and exercise for weight loss Continue Glipizide Foot exam today Encouraged yearly eye exam Flu and pneumovax UTD

## 2017-08-25 NOTE — Assessment & Plan Note (Signed)
On Celebrex Following with rheumatology CMET today

## 2017-08-25 NOTE — Assessment & Plan Note (Signed)
Stable on Fluoxetine, Wellbutrin and Lamictal CMET today Support offered today.

## 2017-08-26 ENCOUNTER — Other Ambulatory Visit: Payer: Self-pay | Admitting: Internal Medicine

## 2017-08-26 NOTE — Telephone Encounter (Signed)
noted 

## 2017-08-29 NOTE — Progress Notes (Deleted)
Office Visit Note  Patient: Anthony Castillo             Date of Birth: 1965-04-06           MRN: [id number]             PCP: Lorre Munroe, NP Referring: Lorre Munroe, NP Visit Date: 09/06/2017 Occupation: @GUAROCC @    Subjective:  No chief complaint on file.   History of Present Illness: Anthony Castillo is a 52 y.o. male ***   Activities of Daily Living:  Patient reports morning stiffness for *** {minute/hour:19697}.   Patient {ACTIONS;DENIES/REPORTS:21021675::"Denies"} nocturnal pain.  Difficulty dressing/grooming: {ACTIONS;DENIES/REPORTS:21021675::"Denies"} Difficulty climbing stairs: {ACTIONS;DENIES/REPORTS:21021675::"Denies"} Difficulty getting out of chair: {ACTIONS;DENIES/REPORTS:21021675::"Denies"} Difficulty using hands for taps, buttons, cutlery, and/or writing: {ACTIONS;DENIES/REPORTS:21021675::"Denies"}   No Rheumatology ROS completed.   PMFS History:  Patient Active Problem List   Diagnosis Date Noted  . Pure hypercholesterolemia 08/09/2016  . DM (diabetes mellitus), type 2 (HCC) 04/29/2016  . GERD (gastroesophageal reflux disease) 04/29/2016  . Seasonal allergies 04/29/2016  . Osteoarthritis 04/09/2011  . Hypogonadism male 04/09/2011  . Depression, major, single episode, in partial remission (HCC) 03/16/2007  . Sleep apnea 11/29/2006  . Essential hypertension 09/22/2006    Past Medical History:  Diagnosis Date  . Chronic pain 04/09/2011  . DEPRESSION 03/16/2007  . GERD (gastroesophageal reflux disease)   . HYPERTENSION 09/22/2006  . Hypogonadism male 04/09/2011  . Morbid obesity (HCC) 09/22/2006  . Osteoarthritis 04/09/2011  . Sleep apnea    uses a cpap-will use after surgery  . Wears glasses     Family History  Problem Relation Age of Onset  . Cancer Mother        lymphoma  . Heart disease Father   . Hypertension Father   . Diabetes Sister   . Stroke Paternal Uncle   . Diabetes Maternal Grandmother   . Arthritis Paternal Grandmother   .  Heart disease Paternal Grandfather   . Migraines Daughter   . Irritable bowel syndrome Daughter   . Heart Problems Son        valve    Past Surgical History:  Procedure Laterality Date  . CARPAL TUNNEL RELEASE Right 05/17/2013   Procedure: RIGHT CARPAL TUNNEL RELEASE;  Surgeon: Wyn Forster., MD;  Location: [facility];  Service: Orthopedics;  Laterality: Right;  . CARPAL TUNNEL RELEASE Left 07/19/2013   Procedure: LEFT CARPAL TUNNEL RELEASE;  Surgeon: Wyn Forster., MD;  Location: [facility];  Service: Orthopedics;  Laterality: Left;  . FOOT SURGERY Right 01/2015  . KNEE ARTHROSCOPY  6/14   right  . TONSILLECTOMY    . ULNAR NERVE TRANSPOSITION Right 05/17/2013   Procedure: RIGHT ULNAR NERVE DECOMPRESSION;  Surgeon: Wyn Forster., MD;  Location:  SURGERY CENTER;  Service: Orthopedics;  Laterality: Right;  . WISDOM TOOTH EXTRACTION     Social History   Social History Narrative  . Not on file     Objective: Vital Signs: There were no vitals taken for this visit.   Physical Exam   Musculoskeletal Exam: ***  CDAI Exam: No CDAI exam completed.    Investigation: No additional findings.  Component     Latest Ref Rng & Units 08/01/2017  Uric Acid, Serum     4.0 - 8.0 mg/dL 3.4 (L)  Cyclic Citrullin Peptide Ab     UNITS <16  14-3-3 eta Protein     <0.2 ng/mL <0.2   CBC  Latest Ref Rng & Units 08/23/2017 05/24/2017 08/09/2016  WBC 4.0 - 10.5 K/uL 4.5 5.0 5.6  Hemoglobin 13.0 - 17.0 g/dL 12.7(L) 12.6(L) 12.8(L)  Hematocrit 39.0 - 52.0 % 37.8(L) 37.7(L) 38.8(L)  Platelets 150.0 - 400.0 K/uL 202.0 222.0 221.0   CMP Latest Ref Rng & Units 08/23/2017 05/24/2017 08/09/2016  Glucose 70 - 99 mg/dL 409(W118(H) 119(J237(H) 478(G152(H)  BUN 6 - 23 mg/dL 12 11 17   Creatinine 0.40 - 1.50 mg/dL 9.561.03 2.131.07 0.860.95  Sodium 135 - 145 mEq/L 137 133(L) 135  Potassium 3.5 - 5.1 mEq/L 4.1 4.1 4.2  Chloride 96 - 112 mEq/L 101 99 101  CO2 19 - 32 mEq/L 28 27 25     Calcium 8.4 - 10.5 mg/dL 9.2 8.9 9.5  Total Protein 6.0 - 8.3 g/dL 6.7 6.7 6.6  Total Bilirubin 0.2 - 1.2 mg/dL 0.7 0.7 0.6  Alkaline Phos 39 - 117 U/L 89 83 74  AST 0 - 37 U/L 32 31 46(H)  ALT 0 - 53 U/L 33 34 62(H)     Imaging: Xr Foot 2 Views Left  Result Date: 08/01/2017 His screws noted in the calcaneum and the first metatarsal.  Subluxation of some MTPs was noted.  No erosive changes were noted.  PIP and DIP narrowing was noted. Impression: These findings are consistent with osteoarthritis of the foot.  Xr Foot 2 Views Right  Result Date: 08/01/2017 First MTP, all PIP and DIP narrowing was noted.  No erosive changes were noted.  Calcaneal spurs were noted. Impression: These findings are consistent with osteoarthritis of the foot.  Xr Hand 2 View Left  Result Date: 08/01/2017 PIP and DIP narrowing was noted.  No MCP joint narrowing was noted.  No intercarpal radiocarpal joint space narrowing was noted.  Mild CMC narrowing was noted. Impression: These findings are consistent with osteoarthritis of the hand.  Xr Hand 2 View Right  Result Date: 08/01/2017 PIP and DIP narrowing was noted.  No MCP joint narrowing was noted.  No intercarpal radiocarpal joint space narrowing was noted.  Mild CMC narrowing was noted. Impression: These findings are consistent with osteoarthritis of the hand.  Xr Knee 3 View Left  Result Date: 08/01/2017 Moderate medial compartment narrowing was noted.  No chondrocalcinosis was noted.  Severe patellofemoral narrowing was noted. Impression: These findings are consistent with moderate osteoarthritis and severe chondromalacia patella.  Xr Knee 3 View Right  Result Date: 08/01/2017 Severe medial compartment narrowing was noted.  Severe patellofemoral narrowing was noted.  Impression: These findings are consistent with severe osteoarthritis and severe chondromalacia patella.  Xr Lumbar Spine 2-3 Views  Result Date: 08/01/2017 No significant disc space narrowing  was noted.  Anterior spurring was noted.  Facet joint arthropathy was noted. Impression: These findings are consistent with facet joint arthropathy.   Speciality Comments: No specialty comments available.    Procedures:  No procedures performed Allergies: Penicillins   Assessment / Plan:     Visit Diagnoses: No diagnosis found.    Orders: No orders of the defined types were placed in this encounter.  No orders of the defined types were placed in this encounter.   Face-to-face time spent with patient was *** minutes. Greater than 50% of time was spent in counseling and coordination of care.  Follow-Up Instructions: No follow-ups on file.   Gearldine Bienenstockaylor M Dale, PA-C  Note - This record has been created using Dragon software.  Chart creation errors have been sought, but may not always  have been located.  Such creation errors do not reflect on  the standard of medical care.

## 2017-09-06 ENCOUNTER — Ambulatory Visit: Payer: BLUE CROSS/BLUE SHIELD | Admitting: Rheumatology

## 2017-09-09 ENCOUNTER — Other Ambulatory Visit: Payer: Self-pay | Admitting: Internal Medicine

## 2017-09-10 DIAGNOSIS — G4733 Obstructive sleep apnea (adult) (pediatric): Secondary | ICD-10-CM | POA: Diagnosis not present

## 2017-09-14 ENCOUNTER — Telehealth: Payer: Self-pay | Admitting: Internal Medicine

## 2017-09-14 NOTE — Telephone Encounter (Signed)
Spoke to spouse Lynden AngCathy, ok per DPR... She is aware that we have the results and will contact them when it has been completely reviewed. She expressed understanding

## 2017-09-14 NOTE — Telephone Encounter (Signed)
Copied from CRM [id number]. Topic: Quick Communication - See Telephone Encounter >> Sep 14, 2017  2:14 PM Burchel, Abbi R wrote: CRM for notification. See Telephone encounter for: 09/14/17.  Pt's wife called to ask about results from recent biopsy of skin lesions on pt's neck.  Please call pt or pt's wife when results are available.   Pt's wife: [phone]

## 2017-09-17 ENCOUNTER — Other Ambulatory Visit: Payer: Self-pay | Admitting: Internal Medicine

## 2017-09-17 DIAGNOSIS — L989 Disorder of the skin and subcutaneous tissue, unspecified: Secondary | ICD-10-CM

## 2017-09-19 ENCOUNTER — Other Ambulatory Visit: Payer: Self-pay | Admitting: Internal Medicine

## 2017-09-19 DIAGNOSIS — M791 Myalgia, unspecified site: Secondary | ICD-10-CM

## 2017-09-19 DIAGNOSIS — M2550 Pain in unspecified joint: Secondary | ICD-10-CM

## 2017-09-28 ENCOUNTER — Other Ambulatory Visit: Payer: Self-pay | Admitting: Internal Medicine

## 2017-09-29 ENCOUNTER — Ambulatory Visit: Payer: BLUE CROSS/BLUE SHIELD | Admitting: Internal Medicine

## 2017-09-29 ENCOUNTER — Encounter: Payer: Self-pay | Admitting: Internal Medicine

## 2017-09-29 VITALS — BP 142/84 | HR 77 | Temp 98.4°F | Wt 362.0 lb

## 2017-09-29 DIAGNOSIS — M159 Polyosteoarthritis, unspecified: Secondary | ICD-10-CM

## 2017-09-29 DIAGNOSIS — R5383 Other fatigue: Secondary | ICD-10-CM | POA: Diagnosis not present

## 2017-09-29 DIAGNOSIS — G4733 Obstructive sleep apnea (adult) (pediatric): Secondary | ICD-10-CM | POA: Diagnosis not present

## 2017-09-29 DIAGNOSIS — R7989 Other specified abnormal findings of blood chemistry: Secondary | ICD-10-CM

## 2017-09-29 DIAGNOSIS — R0981 Nasal congestion: Secondary | ICD-10-CM

## 2017-09-29 DIAGNOSIS — R05 Cough: Secondary | ICD-10-CM

## 2017-09-29 DIAGNOSIS — M8949 Other hypertrophic osteoarthropathy, multiple sites: Secondary | ICD-10-CM

## 2017-09-29 DIAGNOSIS — R059 Cough, unspecified: Secondary | ICD-10-CM

## 2017-09-29 DIAGNOSIS — J302 Other seasonal allergic rhinitis: Secondary | ICD-10-CM

## 2017-09-29 DIAGNOSIS — M150 Primary generalized (osteo)arthritis: Secondary | ICD-10-CM

## 2017-09-29 DIAGNOSIS — R0602 Shortness of breath: Secondary | ICD-10-CM

## 2017-09-29 MED ORDER — PREDNISONE 10 MG PO TABS: ORAL_TABLET | ORAL | 0 refills | Status: DC

## 2017-09-29 MED ORDER — TIZANIDINE HCL 4 MG PO CAPS: 4.0000 mg | ORAL_CAPSULE | Freq: Every evening | ORAL | 0 refills | Status: DC | PRN

## 2017-09-29 NOTE — Patient Instructions (Signed)
Arthritis Arthritis means joint pain. It can also mean joint disease. A joint is a place where bones come together. People who have arthritis may have:  Red joints.  Swollen joints.  Stiff joints.  Warm joints.  A fever.  A feeling of being sick.  Follow these instructions at home: Pay attention to any changes in your symptoms. Take these actions to help with your pain and swelling. Medicines  Take over-the-counter and prescription medicines only as told by your doctor.  Do not take aspirin for pain if your doctor says that you may have gout. Activity  Rest your joint if your doctor tells you to.  Avoid activities that make the pain worse.  Exercise your joint regularly as told by your doctor. Try doing exercises like: ? Swimming. ? Water aerobics. ? Biking. ? Walking. Joint Care   If your joint is swollen, keep it raised (elevated) if told by your doctor.  If your joint feels stiff in the morning, try taking a warm shower.  If you have diabetes, do not apply heat without asking your doctor.  If told, apply heat to the joint: ? Put a towel between the joint and the hot pack or heating pad. ? Leave the heat on the area for 20-30 minutes.  If told, apply ice to the joint: ? Put ice in a plastic bag. ? Place a towel between your skin and the bag. ? Leave the ice on for 20 minutes, 2-3 times per day.  Keep all follow-up visits as told by your doctor. Contact a doctor if:  The pain gets worse.  You have a fever. Get help right away if:  You have very bad pain in your joint.  You have swelling in your joint.  Your joint is red.  Many joints become painful and swollen.  You have very bad back pain.  Your leg is very weak.  You cannot control your pee (urine) or poop (stool). This information is not intended to replace advice given to you by your health care provider. Make sure you discuss any questions you have with your health care provider. Document  Released: 05/12/2009 Document Revised: 07/24/2015 Document Reviewed: 05/13/2014 Elsevier Interactive Patient Education  2018 Elsevier Inc.  

## 2017-09-29 NOTE — Progress Notes (Signed)
Subjective:    Patient ID: Anthony Castillo, male    DOB: 03-05-1965, 52 y.o.   MRN: [id number]  HPI  Pt presents to the clinic today to with multiple complaints.  He reports persistent nasal congestion, cough and shortness of breath. He reports this has been going on a few months now. He is not blowing much out of his nose. The cough is non productive. He denies chest pain or chest tightness. He denies fever, chills or body aches. He has tried Flonase and  Zyrtec with minimal relief. He does not smoke.  He also reports persistent muscle and joint aches. This has been an ongoing issue. It is mainly his hips, shoulders, knees, elbows and feet. He saw rheumatology 07/2017. Labs and xrays were done. He was diagnosed with osteoarthritis. He is taking Celebrex with no improvement. He does not have a follow up scheduled with rheumatology.  He also reports ongoing fatigue. His TSH, B12 and Vit D were all normal. His testosterone is low. He saw a urologist, but reports he was never started on testosterone replacement. He does not have a follow up with urology.  Review of Systems      Past Medical History:  Diagnosis Date  . Chronic pain 04/09/2011  . DEPRESSION 03/16/2007  . GERD (gastroesophageal reflux disease)   . HYPERTENSION 09/22/2006  . Hypogonadism male 04/09/2011  . Morbid obesity (HCC) 09/22/2006  . Osteoarthritis 04/09/2011  . Sleep apnea    uses a cpap-will use after surgery  . Wears glasses     Current Outpatient Medications  Medication Sig Dispense Refill  . Acetaminophen (TYLENOL ARTHRITIS PAIN PO) Take by mouth as needed.    Marland Kitchen amLODipine (NORVASC) 10 MG tablet TAKE 1 TABLET BY MOUTH ONCE DAILY 90 tablet 1  . buPROPion (WELLBUTRIN SR) 150 MG 12 hr tablet   0  . celecoxib (CELEBREX) 100 MG capsule TAKE 1 CAPSULE BY MOUTH TWICE DAILY 60 capsule 5  . cetirizine (ZYRTEC) 10 MG tablet Take 10 mg by mouth daily.    . Cholecalciferol (VITAMIN D3) 1000 units CAPS Take 1 capsule by mouth  daily.    . Continuous Blood Gluc Receiver (FREESTYLE LIBRE 14 DAY READER) DEVI 1 application by Does not apply route every 14 (fourteen) days. 2 Device 3  . Continuous Blood Gluc Sensor (FREESTYLE LIBRE 14 DAY SENSOR) MISC 1 each by Does not apply route every 14 (fourteen) days. 2 each 2  . FLUoxetine (PROZAC) 40 MG capsule TAKE 1 CAPSULE BY MOUTH ONCE DAILY 90 capsule 1  . fluticasone (KLS ALLER-FLO) 50 MCG/ACT nasal spray Place 2 sprays into both nostrils as needed.     Marland Kitchen glipiZIDE (GLUCOTROL) 10 MG tablet TAKE 1 TABLET BY MOUTH TWICE DAILY BEFORE A MEAL 60 tablet 5  . lamoTRIgine (LAMICTAL) 100 MG tablet TAKE 1 TABLET BY MOUTH EVERY MORNING 30 tablet 5  . lisinopril (PRINIVIL,ZESTRIL) 10 MG tablet TAKE 1 TABLET BY MOUTH ONCE DAILY 90 tablet 0  . MEGARED OMEGA-3 KRILL OIL PO Take 750 mg by mouth daily.    . Misc Natural Products (NF FORMULAS TESTOSTERONE) CAPS Take 1 capsule by mouth 2 (two) times daily.    . pantoprazole (PROTONIX) 40 MG tablet TAKE 1 TABLET BY MOUTH ONCE DAILY 30 tablet 1  . rosuvastatin (CRESTOR) 10 MG tablet TAKE 1 TABLET BY MOUTH ONCE A DAY 30 tablet 0  . predniSONE (DELTASONE) 10 MG tablet Take 3 tabs on days 1-3, take 2 tabs on days 4-6,  take 1 tab on days 7-9 18 tablet 0  . tiZANidine (ZANAFLEX) 4 MG capsule Take 1 capsule (4 mg total) by mouth at bedtime as needed for muscle spasms. 30 capsule 0   No current facility-administered medications for this visit.     Allergies  Allergen Reactions  . Penicillins     REACTION: unspecified    Family History  Problem Relation Age of Onset  . Cancer Mother        lymphoma  . Heart disease Father   . Hypertension Father   . Diabetes Sister   . Stroke Paternal Uncle   . Diabetes Maternal Grandmother   . Arthritis Paternal Grandmother   . Heart disease Paternal Grandfather   . Migraines Daughter   . Irritable bowel syndrome Daughter   . Heart Problems Son        valve     Social History   Socioeconomic  History  . Marital status: Married    Spouse name: Not on file  . Number of children: Not on file  . Years of education: Not on file  . Highest education level: Not on file  Occupational History  . Not on file  Social Needs  . Financial resource strain: Not on file  . Food insecurity:    Worry: Not on file    Inability: Not on file  . Transportation needs:    Medical: Not on file    Non-medical: Not on file  Tobacco Use  . Smoking status: Never Smoker  . Smokeless tobacco: Current User    Types: Chew  Substance and Sexual Activity  . Alcohol use: Yes    Comment: rare  . Drug use: Never  . Sexual activity: Yes  Lifestyle  . Physical activity:    Days per week: Not on file    Minutes per session: Not on file  . Stress: Not on file  Relationships  . Social connections:    Talks on phone: Not on file    Gets together: Not on file    Attends religious service: Not on file    Active member of club or organization: Not on file    Attends meetings of clubs or organizations: Not on file    Relationship status: Not on file  . Intimate partner violence:    Fear of current or ex partner: Not on file    Emotionally abused: Not on file    Physically abused: Not on file    Forced sexual activity: Not on file  Other Topics Concern  . Not on file  Social History Narrative  . Not on file     Constitutional: Pt reports fatigue. Denies fever, malaise, headache or abrupt weight changes.  HEENT: Pt reports nasal congestion. Denies eye pain, eye redness, ear pain, ringing in the ears, wax buildup, runny nose, bloody nose, or sore throat. Respiratory: Pt reports cough and shortness of breath. Denies difficulty breathing, or sputum production.   Cardiovascular: Denies chest pain, chest tightness, palpitations or swelling in the hands or feet.  Musculoskeletal: Pt reports muscle and joint pain. Denies decrease in range of motion, difficulty with gait, or joint swelling.    No other  specific complaints in a complete review of systems (except as listed in HPI above).  Objective:   Physical Exam   BP (!) 142/84   Pulse 77   Temp 98.4 F (36.9 C) (Oral)   Wt (!) 362 lb (164.2 kg)   SpO2 97%  BMI 47.76 kg/m  Wt Readings from Last 3 Encounters:  09/29/17 (!) 362 lb (164.2 kg)  08/23/17 (!) 357 lb (161.9 kg)  08/01/17 (!) 360 lb (163.3 kg)    General: Appears his stated age, obese in NAD. HEENT: Head: normal shape and size, no sinus tenderness noted; Ears: Tm's gray and intact, normal light reflex; Nose: mucosa pink and moist, septum midline; Throat/Mouth: Teeth present, mucosa erythematous and moist,+ PND,  no exudate, lesions or ulcerations noted.  Neck:  Neck supple, trachea midline. No masses, lumps or thyromegaly present.  Cardiovascular: Normal rate and rhythm.  Pulmonary/Chest: Normal effort and positive vesicular breath sounds. No respiratory distress. No wheezes, rales or ronchi noted.  Musculoskeletal: No signs of joint swelling. Strength 5/5 BUE/BLE.  Neurological: Alert and oriented. Sensation intact to BUE/BLE.  BMET    Component Value Date/Time   NA 137 08/23/2017 1024   K 4.1 08/23/2017 1024   CL 101 08/23/2017 1024   CO2 28 08/23/2017 1024   GLUCOSE 118 (H) 08/23/2017 1024   BUN 12 08/23/2017 1024   CREATININE 1.03 08/23/2017 1024   CALCIUM 9.2 08/23/2017 1024   GFRNONAA >90 05/15/2013 0922   GFRAA >90 05/15/2013 0922    Lipid Panel     Component Value Date/Time   CHOL 121 08/23/2017 1024   TRIG 201.0 (H) 08/23/2017 1024   HDL 29.00 (L) 08/23/2017 1024   CHOLHDL 4 08/23/2017 1024   VLDL 40.2 (H) 08/23/2017 1024   LDLCALC 74 12/05/2006 0846    CBC    Component Value Date/Time   WBC 4.5 08/23/2017 1024   RBC 4.75 08/23/2017 1024   HGB 12.7 (L) 08/23/2017 1024   HCT 37.8 (L) 08/23/2017 1024   PLT 202.0 08/23/2017 1024   MCV 79.5 08/23/2017 1024   MCHC 33.5 08/23/2017 1024   RDW 14.7 08/23/2017 1024    Hgb A1C Lab  Results  Component Value Date   HGBA1C 7.1 (H) 08/23/2017          Assessment & Plan:   Fatigue, Low Testosterone:  Reviewed urology note Pt was supposed to call back with wether he wanted injections vs topical Advised him to follow up with urology Encouraged routine exercise, for weight loss Continue CPAP  Nasal Congestion, Cough, SOB, Allergies:  Will trial Pred Taper Continue Zyrtec Hold Flonase while on Prednisone  Osteoarthritis:  eRx for Pred Taper Hold Celebrex while on Prednisone Encouraged activity for weight loss Start Tylenol 650 mg 3 x day eRx for Zanaflex 4 mg QHS prn -sedation caution given Consider referral to pain management for possible injections if worse  Return precautions discussed Nicki Reaperegina Alannie Amodio, NP

## 2017-09-30 DIAGNOSIS — G4733 Obstructive sleep apnea (adult) (pediatric): Secondary | ICD-10-CM | POA: Diagnosis not present

## 2017-10-10 DIAGNOSIS — L730 Acne keloid: Secondary | ICD-10-CM | POA: Diagnosis not present

## 2017-10-11 DIAGNOSIS — G4733 Obstructive sleep apnea (adult) (pediatric): Secondary | ICD-10-CM | POA: Diagnosis not present

## 2017-10-12 ENCOUNTER — Telehealth: Payer: Self-pay | Admitting: Internal Medicine

## 2017-10-12 ENCOUNTER — Ambulatory Visit: Payer: BLUE CROSS/BLUE SHIELD | Admitting: Family Medicine

## 2017-10-12 ENCOUNTER — Encounter: Payer: Self-pay | Admitting: Family Medicine

## 2017-10-12 VITALS — BP 130/62 | HR 63 | Temp 97.8°F | Ht 73.0 in | Wt 360.0 lb

## 2017-10-12 DIAGNOSIS — M898X1 Other specified disorders of bone, shoulder: Secondary | ICD-10-CM | POA: Diagnosis not present

## 2017-10-12 DIAGNOSIS — E291 Testicular hypofunction: Secondary | ICD-10-CM

## 2017-10-12 DIAGNOSIS — M542 Cervicalgia: Secondary | ICD-10-CM | POA: Diagnosis not present

## 2017-10-12 DIAGNOSIS — M2550 Pain in unspecified joint: Secondary | ICD-10-CM

## 2017-10-12 MED ORDER — GABAPENTIN 300 MG PO CAPS: 300.0000 mg | ORAL_CAPSULE | Freq: Every day | ORAL | 3 refills | Status: DC

## 2017-10-12 MED ORDER — PREDNISONE 20 MG PO TABS: ORAL_TABLET | ORAL | 0 refills | Status: DC

## 2017-10-12 NOTE — Telephone Encounter (Signed)
Pt would like Anthony KocherRegina to call him about his   Testosterone Best number [phone]

## 2017-10-12 NOTE — Progress Notes (Signed)
Dr. Karleen Hampshire T. Wadie Mattie, MD, CAQ Sports Medicine Primary Care and Sports Medicine [address] [place], [address] Phone: [phone] Fax: [phone]  10/12/2017  Patient: Anthony Castillo, MRN: [id number], DOB: 13-Aug-1965, 52 y.o.  Primary Physician:  Lorre Munroe, NP   Chief Complaint  Patient presents with  . Shoulder Pain    Left-Up under left shoulder blade   Subjective:   Anthony Castillo is a 52 y.o. very pleasant male patient who presents with the following:  L shoulder is having some sharp pains and last night and started to get worse and worse - under shoulder blade. 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  He recently had a fairly large-scale polyarthralgia work-up done by Dr. Corliss Skains, and she felt that he had osteoarthritis without a rheumatological component.  He has had 2 successive AM total testosterones that were 170's.  He is primarily having pain underneath his left shoulder blade.  He denies having any numbness or tingling.  He is not having a painful arc of motion.  He is not having any significant pain with internal range of motion.  He has not had any prior operative or intervention in the affected shoulder, and he is never had previous dislocations or fractures.  No numbness or tingling going down his arm.  Took some prednisone last week - did not help that much.  Already on Celebrex daily.  Test 172 and 174 -- talk to her about test scripts  Past Medical History, Surgical History, Social History, Family History, Problem List, Medications, and Allergies have been reviewed and updated if relevant.  Patient Active Problem List   Diagnosis Date Noted  . Pure hypercholesterolemia 08/09/2016  . DM (diabetes mellitus), type 2 (HCC) 04/29/2016  . GERD (gastroesophageal reflux disease) 04/29/2016  . Seasonal allergies 04/29/2016  . Osteoarthritis 04/09/2011  . Hypogonadism male 04/09/2011  . Depression, major, single  episode, in partial remission (HCC) 03/16/2007  . Sleep apnea 11/29/2006  . Essential hypertension 09/22/2006    Past Medical History:  Diagnosis Date  . Chronic pain 04/09/2011  . DEPRESSION 03/16/2007  . GERD (gastroesophageal reflux disease)   . HYPERTENSION 09/22/2006  . Hypogonadism male 04/09/2011  . Morbid obesity (HCC) 09/22/2006  . Osteoarthritis 04/09/2011  . Sleep apnea    uses a cpap-will use after surgery  . Wears glasses     Past Surgical History:  Procedure Laterality Date  . CARPAL TUNNEL RELEASE Right 05/17/2013   Procedure: RIGHT CARPAL TUNNEL RELEASE;  Surgeon: Wyn Forster., MD;  Location: [facility];  Service: Orthopedics;  Laterality: Right;  . CARPAL TUNNEL RELEASE Left 07/19/2013   Procedure: LEFT CARPAL TUNNEL RELEASE;  Surgeon: Wyn Forster., MD;  Location: [facility];  Service: Orthopedics;  Laterality: Left;  . FOOT SURGERY Right 01/2015  . KNEE ARTHROSCOPY  6/14   right  . TONSILLECTOMY    . ULNAR NERVE TRANSPOSITION Right 05/17/2013   Procedure: RIGHT ULNAR NERVE DECOMPRESSION;  Surgeon: Wyn Forster., MD;  Location: [facility];  Service: Orthopedics;  Laterality: Right;  . WISDOM TOOTH EXTRACTION      Social History   Socioeconomic History  . Marital status: Married    Spouse name: Not on file  . Number of children: Not on file  . Years of education: Not on file  . Highest education level: Not on file  Occupational History  .  Not on file  Social Needs  . Financial resource strain: Not on file  . Food insecurity:    Worry: Not on file    Inability: Not on file  . Transportation needs:    Medical: Not on file    Non-medical: Not on file  Tobacco Use  . Smoking status: Never Smoker  . Smokeless tobacco: Current User    Types: Chew  Substance and Sexual Activity  . Alcohol use: Yes    Comment: rare  . Drug use: Never  . Sexual activity: Yes  Lifestyle  . Physical activity:     Days per week: Not on file    Minutes per session: Not on file  . Stress: Not on file  Relationships  . Social connections:    Talks on phone: Not on file    Gets together: Not on file    Attends religious service: Not on file    Active member of club or organization: Not on file    Attends meetings of clubs or organizations: Not on file    Relationship status: Not on file  . Intimate partner violence:    Fear of current or ex partner: Not on file    Emotionally abused: Not on file    Physically abused: Not on file    Forced sexual activity: Not on file  Other Topics Concern  . Not on file  Social History Narrative  . Not on file    Family History  Problem Relation Age of Onset  . Cancer Mother        lymphoma  . Heart disease Father   . Hypertension Father   . Diabetes Sister   . Stroke Paternal Uncle   . Diabetes Maternal Grandmother   . Arthritis Paternal Grandmother   . Heart disease Paternal Grandfather   . Migraines Daughter   . Irritable bowel syndrome Daughter   . Heart Problems Son        valve     Allergies  Allergen Reactions  . Penicillins     REACTION: unspecified    Medication list reviewed and updated in full in [facility].  GEN: No fevers, chills. Nontoxic. Primarily MSK c/o today. MSK: Detailed in the HPI GI: tolerating PO intake without difficulty Neuro: No numbness, parasthesias, or tingling associated. Otherwise the pertinent positives of the ROS are noted above.   Objective:   BP 130/62   Pulse 63   Temp 97.8 F (36.6 C) (Oral)   Ht 6\' 1"  (1.854 m)   Wt (!) 360 lb (163.3 kg)   BMI 47.50 kg/m    GEN: Well-developed,well-nourished,in no acute distress; alert,appropriate and cooperative throughout examination HEENT: Normocephalic and atraumatic without obvious abnormalities. Ears, externally no deformities PULM: Breathing comfortably in no respiratory distress EXT: No clubbing, cyanosis, or edema PSYCH: Normally interactive.  Cooperative during the interview. Pleasant. Friendly and conversant. Not anxious or depressed appearing. Normal, full affect.  CERVICAL SPINE EXAM Range of motion: Flexion, extension, lateral bending, and rotation: 15% loss global rom Pain with terminal motion: mild-mod Spinous Processes: NT SCM: NT Upper paracervical muscles: ttp Upper traps: ttp C5-T1 intact, sensation and motor   Shoulder: L Inspection: No muscle wasting or winging Ecchymosis/edema: neg  AC joint, scapula, clavicle: NT Abduction: full, 5/5 Flexion: full, 5/5 IR, full, lift-off: 5/5 ER at neutral: full, 5/5 AC crossover and compression: neg Neer: neg Hawkins: neg Drop Test: neg Empty Can: neg Supraspinatus insertion: NT Bicipital groove: NT Speed's: neg Yergason's:  neg Sulcus sign: neg Scapular dyskinesis: none C5-T1 intact Sensation intact Grip 5/5   Radiology: No results found.   Lab Results  Component Value Date   TESTOSTERONE 172 (L) 06/29/2017    Test 05/24/2017 174  Assessment and Plan:   Cervicalgia  Shoulder blade pain  Hypogonadism in male  Morbid obesity (HCC)  Polyarthralgia  Shoulder examination on the left is benign.  The patient's symptoms are coming from referred pain from his neck.  Referred pain to the shoulder blade via the neck.  I am going to extend and increase the patient's prednisone dosing for 10 days.  Completely agree with original plan with prednisone and Zanaflex.  I am also going to place the patient on some gabapentin at nighttime to hopefully help with some neuropathic pain.  Globally multiple other complaints including fatigue and polyarthralgia.  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  He does have hypogonadism with 2 successive a.m. testosterone results in the 170s.  I think that treating his hypogonadism may help with his polyarthralgia, fatigue, as well as weight.  I discussed this  face-to-face with his primary care doctor.  Follow-up: Return in about 3 weeks (around 11/02/2017).  Meds ordered this encounter  Medications  . predniSONE (DELTASONE) 20 MG tablet    Sig: 2 tabs po for 5 days, then 1 tab po for 5 days    Dispense:  15 tablet    Refill:  0  . gabapentin (NEURONTIN) 300 MG capsule    Sig: Take 1 capsule (300 mg total) by mouth at bedtime.    Dispense:  30 capsule    Refill:  3   Signed,  Lenville Hibberd T. Brindy Higginbotham, MD   Allergies as of 10/12/2017      Reactions   Penicillins    REACTION: unspecified      Medication List        Accurate as of 10/12/17 11:59 PM. Always use your most recent med list.          amLODipine 10 MG tablet Commonly known as:  NORVASC TAKE 1 TABLET BY MOUTH ONCE DAILY   buPROPion 150 MG 12 hr tablet Commonly known as:  WELLBUTRIN SR   celecoxib 100 MG capsule Commonly known as:  CELEBREX TAKE 1 CAPSULE BY MOUTH TWICE DAILY   cetirizine 10 MG tablet Commonly known as:  ZYRTEC Take 10 mg by mouth daily.   clindamycin 1 % lotion Commonly known as:  CLEOCIN T   fluocinonide cream 0.05 % Commonly known as:  LIDEX   FLUoxetine 40 MG capsule Commonly known as:  PROZAC TAKE 1 CAPSULE BY MOUTH ONCE DAILY   FREESTYLE LIBRE 14 DAY READER Devi 1 application by Does not apply route every 14 (fourteen) days.   FREESTYLE LIBRE 14 DAY SENSOR Misc 1 each by Does not apply route every 14 (fourteen) days.   gabapentin 300 MG capsule Commonly known as:  NEURONTIN Take 1 capsule (300 mg total) by mouth at bedtime.   glipiZIDE 10 MG tablet Commonly known as:  GLUCOTROL TAKE 1 TABLET BY MOUTH TWICE DAILY BEFORE A MEAL   KLS ALLER-FLO 50 MCG/ACT nasal spray Generic drug:  fluticasone Place 2 sprays into both nostrils as needed.   lamoTRIgine 100 MG tablet Commonly known as:  LAMICTAL TAKE 1 TABLET BY MOUTH EVERY MORNING   lisinopril 10 MG tablet Commonly known as:  PRINIVIL,ZESTRIL TAKE 1 TABLET BY MOUTH ONCE  DAILY   MEGARED  OMEGA-3 KRILL OIL PO Take 750 mg by mouth daily.   NF FORMULAS TESTOSTERONE Caps Take 1 capsule by mouth 2 (two) times daily.   pantoprazole 40 MG tablet Commonly known as:  PROTONIX TAKE 1 TABLET BY MOUTH ONCE DAILY   predniSONE 20 MG tablet Commonly known as:  DELTASONE 2 tabs po for 5 days, then 1 tab po for 5 days   rosuvastatin 10 MG tablet Commonly known as:  CRESTOR TAKE 1 TABLET BY MOUTH ONCE A DAY   tiZANidine 4 MG capsule Commonly known as:  ZANAFLEX Take 1 capsule (4 mg total) by mouth at bedtime as needed for muscle spasms.   TYLENOL ARTHRITIS PAIN PO Take by mouth as needed.   Vitamin D3 1000 units Caps Take 1 capsule by mouth daily.

## 2017-10-12 NOTE — Telephone Encounter (Signed)
Pt was advised that [facility] does not manage Testosterone replacement therapy and he would have to follow with Urology

## 2017-10-16 ENCOUNTER — Encounter: Payer: Self-pay | Admitting: Family Medicine

## 2017-10-18 ENCOUNTER — Other Ambulatory Visit: Payer: Self-pay | Admitting: Family Medicine

## 2017-10-18 ENCOUNTER — Other Ambulatory Visit: Payer: Self-pay | Admitting: Internal Medicine

## 2017-10-18 NOTE — Telephone Encounter (Signed)
Last office visit 10/12/2017 by Dr. Patsy Lageropland for Cervicalgia.  Last refilled 10/12/2017 for #15 with no refills.  Refill?

## 2017-11-02 ENCOUNTER — Ambulatory Visit: Payer: BLUE CROSS/BLUE SHIELD | Admitting: Family Medicine

## 2017-11-02 ENCOUNTER — Encounter: Payer: Self-pay | Admitting: Family Medicine

## 2017-11-02 VITALS — BP 156/70 | HR 86 | Temp 98.4°F | Ht 73.0 in | Wt 358.5 lb

## 2017-11-02 DIAGNOSIS — M898X1 Other specified disorders of bone, shoulder: Secondary | ICD-10-CM | POA: Diagnosis not present

## 2017-11-02 DIAGNOSIS — M542 Cervicalgia: Secondary | ICD-10-CM

## 2017-11-02 DIAGNOSIS — E291 Testicular hypofunction: Secondary | ICD-10-CM | POA: Diagnosis not present

## 2017-11-02 NOTE — Progress Notes (Signed)
Dr. Karleen Hampshire T. Edric Fetterman, MD, CAQ Sports Medicine Primary Care and Sports Medicine [address] [place], [address] Phone: [phone] Fax: [phone]  11/02/2017  Patient: Anthony Castillo, MRN: [id number], DOB: 1965/12/30, 52 y.o.  Primary Physician:  Lorre Munroe, NP   Chief Complaint  Patient presents with  . Follow-up    Cervicalgia   Subjective:   Anthony Castillo is a 52 y.o. very pleasant male patient who presents with the following:  F/u L sided shoulder blade pain:   L shoulder blade and shoulder are feeling better.  When I saw him last, I gave him 10 days of prednisone to take, and also gave him some gabapentin to take at nighttime.  Now his symptoms are basically completely gone, and he feels much better.  He still is having only some minor pain.  He still is working informing 7 days a week, very long days. Neck is feeling better some, too.   Still working hard, long days.  Faitigued - uses a sleep apnea machine.   He still complained some of fatigue.  He asked me about hypogonadism.  We talked about this, and also reviewed Dr. Heywood Footman note, who saw him fairly recently.  I completely agree with his analysis of the situation.  Past Medical History, Surgical History, Social History, Family History, Problem List, Medications, and Allergies have been reviewed and updated if relevant.  Patient Active Problem List   Diagnosis Date Noted  . Pure hypercholesterolemia 08/09/2016  . DM (diabetes mellitus), type 2 (HCC) 04/29/2016  . GERD (gastroesophageal reflux disease) 04/29/2016  . Seasonal allergies 04/29/2016  . Osteoarthritis 04/09/2011  . Hypogonadism male 04/09/2011  . Depression, major, single episode, in partial remission (HCC) 03/16/2007  . Sleep apnea 11/29/2006  . Essential hypertension 09/22/2006    Past Medical History:  Diagnosis Date  . Chronic pain 04/09/2011  . DEPRESSION 03/16/2007  . GERD (gastroesophageal reflux disease)   .  HYPERTENSION 09/22/2006  . Hypogonadism male 04/09/2011  . Morbid obesity (HCC) 09/22/2006  . Osteoarthritis 04/09/2011  . Sleep apnea    uses a cpap-will use after surgery  . Wears glasses     Past Surgical History:  Procedure Laterality Date  . CARPAL TUNNEL RELEASE Right 05/17/2013   Procedure: RIGHT CARPAL TUNNEL RELEASE;  Surgeon: Wyn Forster., MD;  Location: [facility];  Service: Orthopedics;  Laterality: Right;  . CARPAL TUNNEL RELEASE Left 07/19/2013   Procedure: LEFT CARPAL TUNNEL RELEASE;  Surgeon: Wyn Forster., MD;  Location: [facility];  Service: Orthopedics;  Laterality: Left;  . FOOT SURGERY Right 01/2015  . KNEE ARTHROSCOPY  6/14   right  . TONSILLECTOMY    . ULNAR NERVE TRANSPOSITION Right 05/17/2013   Procedure: RIGHT ULNAR NERVE DECOMPRESSION;  Surgeon: Wyn Forster., MD;  Location: [facility];  Service: Orthopedics;  Laterality: Right;  . WISDOM TOOTH EXTRACTION      Social History   Socioeconomic History  . Marital status: Married    Spouse name: Not on file  . Number of children: Not on file  . Years of education: Not on file  . Highest education level: Not on file  Occupational History  . Not on file  Social Needs  . Financial resource strain: Not on file  . Food insecurity:    Worry: Not on file    Inability: Not on file  . Transportation needs:    Medical: Not  on file    Non-medical: Not on file  Tobacco Use  . Smoking status: Never Smoker  . Smokeless tobacco: Current User    Types: Chew  Substance and Sexual Activity  . Alcohol use: Yes    Comment: rare  . Drug use: Never  . Sexual activity: Yes  Lifestyle  . Physical activity:    Days per week: Not on file    Minutes per session: Not on file  . Stress: Not on file  Relationships  . Social connections:    Talks on phone: Not on file    Gets together: Not on file    Attends religious service: Not on file    Active member of  club or organization: Not on file    Attends meetings of clubs or organizations: Not on file    Relationship status: Not on file  . Intimate partner violence:    Fear of current or ex partner: Not on file    Emotionally abused: Not on file    Physically abused: Not on file    Forced sexual activity: Not on file  Other Topics Concern  . Not on file  Social History Narrative  . Not on file    Family History  Problem Relation Age of Onset  . Cancer Mother        lymphoma  . Heart disease Father   . Hypertension Father   . Diabetes Sister   . Stroke Paternal Uncle   . Diabetes Maternal Grandmother   . Arthritis Paternal Grandmother   . Heart disease Paternal Grandfather   . Migraines Daughter   . Irritable bowel syndrome Daughter   . Heart Problems Son        valve     Allergies  Allergen Reactions  . Penicillins     REACTION: unspecified    Medication list reviewed and updated in full in [facility].  GEN: No fevers, chills. Nontoxic. Primarily MSK c/o today. MSK: Detailed in the HPI GI: tolerating PO intake without difficulty Neuro: No numbness, parasthesias, or tingling associated. Otherwise the pertinent positives of the ROS are noted above.   Objective:   BP (!) 156/70   Pulse 86   Temp 98.4 F (36.9 C) (Oral)   Ht 6\' 1"  (1.854 m)   Wt (!) 358 lb 8 oz (162.6 kg)   BMI 47.30 kg/m    GEN: Well-developed,well-nourished,in no acute distress; alert,appropriate and cooperative throughout examination HEENT: Normocephalic and atraumatic without obvious abnormalities. Ears, externally no deformities PULM: Breathing comfortably in no respiratory distress EXT: No clubbing, cyanosis, or edema PSYCH: Normally interactive. Cooperative during the interview. Pleasant. Friendly and conversant. Not anxious or depressed appearing. Normal, full affect.  CERVICAL SPINE EXAM Range of motion: Flexion, extension, lateral bending, and rotation: full Pain with terminal  motion: none Spinous Processes: NT SCM: NT Upper paracervical muscles: minimal Upper traps: NT C5-T1 intact, sensation and motor   Shoulder: L Inspection: No muscle wasting or winging Ecchymosis/edema: neg  AC joint, scapula, clavicle: NT Cervical spine: NT, full ROM Spurling's: neg Abduction: full, 5/5 Flexion: full, 5/5 IR, full, lift-off: 5/5 ER at neutral: full, 5/5 AC crossover and compression: neg Neer: neg Hawkins: neg Drop Test: neg Empty Can: neg Supraspinatus insertion: NT Bicipital groove: NT Speed's: neg Yergason's: neg Sulcus sign: neg Scapular dyskinesis: none C5-T1 intact Sensation intact Grip 5/5   Radiology:  Assessment and Plan:   Shoulder blade pain  Cervicalgia  Hypogonadism in male  Morbid obesity (  HCC)  Shoulder and neck appear much better.  I do think it is reasonable for him to consider a trial of some testosterone replacement.  His PCP does not do testosterone replacement.  I encouraged him to follow-up again with Dr. Lonna Cobb, who he saw previously, and with a total testosterone of 170, this may make a difference in his quality of life.  Follow-up: prn  Signed,  Courtnee Myer T. Magali Bray, MD   Allergies as of 11/02/2017      Reactions   Penicillins    REACTION: unspecified      Medication List        Accurate as of 11/02/17 11:59 PM. Always use your most recent med list.          amLODipine 10 MG tablet Commonly known as:  NORVASC TAKE 1 TABLET BY MOUTH ONCE DAILY   buPROPion 150 MG 12 hr tablet Commonly known as:  WELLBUTRIN SR   celecoxib 100 MG capsule Commonly known as:  CELEBREX TAKE 1 CAPSULE BY MOUTH TWICE DAILY   cetirizine 10 MG tablet Commonly known as:  ZYRTEC Take 10 mg by mouth daily.   clindamycin 1 % lotion Commonly known as:  CLEOCIN T   fluocinonide cream 0.05 % Commonly known as:  LIDEX   FLUoxetine 40 MG capsule Commonly known as:  PROZAC TAKE 1 CAPSULE BY MOUTH ONCE DAILY   FREESTYLE LIBRE  14 DAY READER Devi 1 application by Does not apply route every 14 (fourteen) days.   FREESTYLE LIBRE 14 DAY SENSOR Misc 1 each by Does not apply route every 14 (fourteen) days.   gabapentin 300 MG capsule Commonly known as:  NEURONTIN Take 1 capsule (300 mg total) by mouth at bedtime.   glipiZIDE 10 MG tablet Commonly known as:  GLUCOTROL TAKE 1 TABLET BY MOUTH TWICE DAILY BEFORE A MEAL   KLS ALLER-FLO 50 MCG/ACT nasal spray Generic drug:  fluticasone Place 2 sprays into both nostrils as needed.   lamoTRIgine 100 MG tablet Commonly known as:  LAMICTAL TAKE 1 TABLET BY MOUTH EVERY MORNING   lisinopril 10 MG tablet Commonly known as:  PRINIVIL,ZESTRIL TAKE 1 TABLET BY MOUTH ONCE DAILY   MEGARED OMEGA-3 KRILL OIL PO Take 750 mg by mouth daily.   NF FORMULAS TESTOSTERONE Caps Take 1 capsule by mouth 2 (two) times daily.   pantoprazole 40 MG tablet Commonly known as:  PROTONIX TAKE 1 TABLET BY MOUTH ONCE DAILY   rosuvastatin 10 MG tablet Commonly known as:  CRESTOR TAKE 1 TABLET BY MOUTH ONCE A DAY   tiZANidine 4 MG capsule Commonly known as:  ZANAFLEX Take 1 capsule (4 mg total) by mouth at bedtime as needed for muscle spasms.   TYLENOL ARTHRITIS PAIN PO Take by mouth as needed.   Vitamin D3 1000 units Caps Take 1 capsule by mouth daily.

## 2017-11-11 DIAGNOSIS — G4733 Obstructive sleep apnea (adult) (pediatric): Secondary | ICD-10-CM | POA: Diagnosis not present

## 2017-11-14 ENCOUNTER — Other Ambulatory Visit: Payer: Self-pay | Admitting: Internal Medicine

## 2017-12-05 ENCOUNTER — Other Ambulatory Visit: Payer: Self-pay | Admitting: Internal Medicine

## 2017-12-11 DIAGNOSIS — G4733 Obstructive sleep apnea (adult) (pediatric): Secondary | ICD-10-CM | POA: Diagnosis not present

## 2017-12-23 ENCOUNTER — Other Ambulatory Visit: Payer: Self-pay | Admitting: Internal Medicine

## 2018-01-11 DIAGNOSIS — G4733 Obstructive sleep apnea (adult) (pediatric): Secondary | ICD-10-CM | POA: Diagnosis not present

## 2018-02-10 DIAGNOSIS — G4733 Obstructive sleep apnea (adult) (pediatric): Secondary | ICD-10-CM | POA: Diagnosis not present

## 2018-02-17 ENCOUNTER — Other Ambulatory Visit: Payer: Self-pay | Admitting: Internal Medicine

## 2018-02-20 ENCOUNTER — Encounter: Payer: Self-pay | Admitting: Family Medicine

## 2018-02-20 ENCOUNTER — Ambulatory Visit: Payer: BLUE CROSS/BLUE SHIELD | Admitting: Family Medicine

## 2018-02-20 VITALS — BP 138/78 | HR 68 | Temp 98.2°F | Ht 73.0 in | Wt 359.2 lb

## 2018-02-20 DIAGNOSIS — H7392 Unspecified disorder of tympanic membrane, left ear: Secondary | ICD-10-CM | POA: Diagnosis not present

## 2018-02-20 NOTE — Progress Notes (Signed)
BP 138/78 (BP Location: Left Arm, Patient Position: Sitting, Cuff Size: Large)   Pulse 68   Temp 98.2 F (36.8 C) (Oral)   Ht 6\' 1"  (1.854 m)   Wt (!) 359 lb 4 oz (163 kg)   SpO2 97%   BMI 47.40 kg/m    CC: ear issues Subjective:    Patient ID: Anthony MarusGeorge D Branagan, male    DOB: 1965-06-16, 52 y.o.   MRN: [id number]  HPI: Anthony Castillo is a 52 y.o. male presenting on 02/20/2018 for Ear Issue (C/o muffled sound, then popping in bilateral ears. Started about wks ago. )   2 wk h/o muffled hearing L>R, popping. No ear pain, drainage, fevers/chills, tinnitus, nausea/vomiting. He is currently congested. Has been using q-tips without benefit. Balance is a bit off.   Denies h/o ear surgery.  Relevant past medical, surgical, family and social history reviewed and updated as indicated. Interim medical history since our last visit reviewed. Allergies and medications reviewed and updated. Outpatient Medications Prior to Visit  Medication Sig Dispense Refill  . Acetaminophen (TYLENOL ARTHRITIS PAIN PO) Take by mouth as needed.    Marland Kitchen. amLODipine (NORVASC) 10 MG tablet TAKE 1 TABLET BY MOUTH ONCE DAILY 90 tablet 1  . buPROPion (WELLBUTRIN SR) 150 MG 12 hr tablet   0  . celecoxib (CELEBREX) 100 MG capsule TAKE 1 CAPSULE BY MOUTH TWICE DAILY 60 capsule 5  . cetirizine (ZYRTEC) 10 MG tablet Take 10 mg by mouth daily.    . Cholecalciferol (VITAMIN D3) 1000 units CAPS Take 1 capsule by mouth daily.    . clindamycin (CLEOCIN T) 1 % lotion   1  . fluocinonide cream (LIDEX) 0.05 %   1  . FLUoxetine (PROZAC) 40 MG capsule TAKE 1 CAPSULE BY MOUTH ONCE DAILY 90 capsule 1  . fluticasone (KLS ALLER-FLO) 50 MCG/ACT nasal spray Place 2 sprays into both nostrils as needed.     . gabapentin (NEURONTIN) 300 MG capsule Take 1 capsule (300 mg total) by mouth at bedtime. 30 capsule 3  . glipiZIDE (GLUCOTROL) 10 MG tablet TAKE 1 TABLET BY MOUTH TWICE DAILY BEFORE A MEAL 60 tablet 5  . lamoTRIgine (LAMICTAL) 100 MG  tablet TAKE 1 TABLET BY MOUTH EVERY MORNING 30 tablet 5  . lisinopril (PRINIVIL,ZESTRIL) 10 MG tablet TAKE 1 TABLET BY MOUTH ONCE DAILY 90 tablet 0  . MEGARED OMEGA-3 KRILL OIL PO Take 750 mg by mouth daily.    . Misc Natural Products (NF FORMULAS TESTOSTERONE) CAPS Take 1 capsule by mouth 2 (two) times daily.    . pantoprazole (PROTONIX) 40 MG tablet TAKE 1 TABLET BY MOUTH ONCE A DAY 30 tablet 2  . rosuvastatin (CRESTOR) 10 MG tablet TAKE 1 TABLET BY MOUTH ONCE DAILY 30 tablet 2  . tiZANidine (ZANAFLEX) 4 MG capsule Take 1 capsule (4 mg total) by mouth at bedtime as needed for muscle spasms. 30 capsule 0  . Continuous Blood Gluc Receiver (FREESTYLE LIBRE 14 DAY READER) DEVI 1 application by Does not apply route every 14 (fourteen) days. 2 Device 3  . Continuous Blood Gluc Sensor (FREESTYLE LIBRE 14 DAY SENSOR) MISC 1 each by Does not apply route every 14 (fourteen) days. 2 each 2   No facility-administered medications prior to visit.      Per HPI unless specifically indicated in ROS section below Review of Systems     Objective:    BP 138/78 (BP Location: Left Arm, Patient Position: Sitting, Cuff Size: Large)  Pulse 68   Temp 98.2 F (36.8 C) (Oral)   Ht 6\' 1"  (1.854 m)   Wt (!) 359 lb 4 oz (163 kg)   SpO2 97%   BMI 47.40 kg/m   Wt Readings from Last 3 Encounters:  02/20/18 (!) 359 lb 4 oz (163 kg)  11/02/17 (!) 358 lb 8 oz (162.6 kg)  10/12/17 (!) 360 lb (163.3 kg)    Physical Exam Vitals signs and nursing note reviewed.  Constitutional:      Appearance: Normal appearance.  HENT:     Head: Normocephalic and atraumatic.     Right Ear: Ear canal and external ear normal.     Left Ear: Ear canal and external ear normal.     Ears:     Comments: R canal clear. Dry wax deep to canal near TM, but TM visualized and pearly grey. L canal largely clear. Yellowish discharge deep canal covering TM, unable to visualize TM.  Irrigation performed bilaterally without any improvement.      Mouth/Throat:     Mouth: Mucous membranes are moist.     Pharynx: Oropharynx is clear.  Neurological:     Mental Status: He is alert.    Lab Results  Component Value Date   HGBA1C 7.1 (H) 08/23/2017       Assessment & Plan:   Problem List Items Addressed This Visit    Abnormal tympanic membrane of left ear - Primary    Cerumen covering right TM but part of TM visualized seems normal.  L canal with what seems like discharge deep in canal overlying TM, not cleared with irrigation. not consistent with AOM - no pain, redness, erythema or significant swelling of external canal. Given abnormal exam, recommended ENT evaluation.      Relevant Orders   Ambulatory referral to ENT       No orders of the defined types were placed in this encounter.  Orders Placed This Encounter  Procedures  . Ambulatory referral to ENT    Referral Priority:   Routine    Referral Type:   Consultation    Referral Reason:   Specialty Services Required    Requested Specialty:   Otolaryngology    Number of Visits Requested:   1    Follow up plan: No follow-ups on file.  Eustaquio BoydenJavier Reeder Brisby, MD

## 2018-02-20 NOTE — Assessment & Plan Note (Addendum)
Cerumen covering right TM but part of TM visualized seems normal.  L canal with what seems like discharge deep in canal overlying TM, not cleared with irrigation. not consistent with AOM - no pain, redness, erythema or significant swelling of external canal. Given abnormal exam, recommended ENT evaluation.

## 2018-02-20 NOTE — Patient Instructions (Signed)
We will refer you to ENT for evaluation of both ears.  We will you call tomorrow for appointment.  For now, avoid qtips.

## 2018-02-21 ENCOUNTER — Telehealth: Payer: Self-pay | Admitting: Internal Medicine

## 2018-02-21 NOTE — Telephone Encounter (Signed)
Cancelled Referral

## 2018-02-21 NOTE — Telephone Encounter (Signed)
Pt's wife Lynden AngCathy (on dpr) called office stating that a referral was put in for the pt to see a ear,nose, & throat specialist but he is already a pt of Dr.McQueen at [facility]. Pt's wife said she would call them and schedule an appointment for her husband.

## 2018-02-27 ENCOUNTER — Telehealth: Payer: Self-pay | Admitting: Internal Medicine

## 2018-02-27 NOTE — Telephone Encounter (Signed)
Pt is requesting a phone call to discuss testosterone. He states he has a couple questions.

## 2018-02-28 NOTE — Telephone Encounter (Signed)
Called pt, no answer and VM not set up so unable to lmovm

## 2018-03-08 ENCOUNTER — Telehealth: Payer: Self-pay

## 2018-03-08 ENCOUNTER — Ambulatory Visit: Payer: BLUE CROSS/BLUE SHIELD | Admitting: Family Medicine

## 2018-03-08 DIAGNOSIS — H6123 Impacted cerumen, bilateral: Secondary | ICD-10-CM | POA: Diagnosis not present

## 2018-03-08 DIAGNOSIS — H60339 Swimmer's ear, unspecified ear: Secondary | ICD-10-CM | POA: Diagnosis not present

## 2018-03-08 NOTE — Telephone Encounter (Signed)
Agree with advice given. Ok to see in office if asymptomatic at this time.

## 2018-03-08 NOTE — Telephone Encounter (Signed)
Pt was seen at eye dr today and BP was 200/102. Pt is not sure if used an electronic BP cuff and is not sure what size cuff was used, or if reading was accurate. Pt has no H/A,dizziness, CP. SOB as usual.Pt has weakness in arms and legs for at least 2 months but pt has low testosterone. No new symptoms.  BP 138/78 on 02/20/18. Pt has not missed taking amlodipine 10 mg daily and lisinopril 10 mg daily. Pt has already taken BP meds earlier today.Now BP 160/80 with lge cuff sitting LA BP 158/76 with lge cuff sitting RA. Pt said he feels OK and already has appt to see R Baity NP on 03/09/18 at 8 AM. Pt may ck BP later this evening at home; if elevated BP or severe HA, dizziness, CP or increased SOB pt will go to UC. FYI to Pamala Hurry NP. Marcelline Mates CMA said Pamala Hurry NP will be on computer this evening).

## 2018-03-09 ENCOUNTER — Ambulatory Visit: Payer: BLUE CROSS/BLUE SHIELD | Admitting: Internal Medicine

## 2018-03-09 ENCOUNTER — Other Ambulatory Visit: Payer: Self-pay | Admitting: Internal Medicine

## 2018-03-13 DIAGNOSIS — G4733 Obstructive sleep apnea (adult) (pediatric): Secondary | ICD-10-CM | POA: Diagnosis not present

## 2018-03-14 ENCOUNTER — Encounter: Payer: Self-pay | Admitting: Internal Medicine

## 2018-03-14 ENCOUNTER — Ambulatory Visit: Payer: BLUE CROSS/BLUE SHIELD | Admitting: Internal Medicine

## 2018-03-14 VITALS — BP 156/84 | HR 76 | Temp 97.8°F | Wt 358.0 lb

## 2018-03-14 DIAGNOSIS — I10 Essential (primary) hypertension: Secondary | ICD-10-CM

## 2018-03-14 DIAGNOSIS — K219 Gastro-esophageal reflux disease without esophagitis: Secondary | ICD-10-CM

## 2018-03-14 DIAGNOSIS — M8949 Other hypertrophic osteoarthropathy, multiple sites: Secondary | ICD-10-CM

## 2018-03-14 DIAGNOSIS — F324 Major depressive disorder, single episode, in partial remission: Secondary | ICD-10-CM

## 2018-03-14 DIAGNOSIS — M150 Primary generalized (osteo)arthritis: Secondary | ICD-10-CM

## 2018-03-14 DIAGNOSIS — E119 Type 2 diabetes mellitus without complications: Secondary | ICD-10-CM

## 2018-03-14 DIAGNOSIS — M159 Polyosteoarthritis, unspecified: Secondary | ICD-10-CM

## 2018-03-14 DIAGNOSIS — G4733 Obstructive sleep apnea (adult) (pediatric): Secondary | ICD-10-CM | POA: Diagnosis not present

## 2018-03-14 DIAGNOSIS — E7800 Pure hypercholesterolemia, unspecified: Secondary | ICD-10-CM

## 2018-03-14 DIAGNOSIS — E291 Testicular hypofunction: Secondary | ICD-10-CM

## 2018-03-14 DIAGNOSIS — R0789 Other chest pain: Secondary | ICD-10-CM

## 2018-03-14 LAB — POCT GLYCOSYLATED HEMOGLOBIN (HGB A1C): Hemoglobin A1C: 7.5 % — AB (ref 4.0–5.6)

## 2018-03-14 MED ORDER — "SYRINGE 20G X 1"" 3 ML MISC": 1.0000 | 0 refills | Status: DC

## 2018-03-14 MED ORDER — TESTOSTERONE CYPIONATE 200 MG/ML IM SOLN: INTRAMUSCULAR | 0 refills | Status: DC

## 2018-03-14 NOTE — Progress Notes (Signed)
Subjective:    Patient ID: Nancy MarusGeorge D Junod, male    DOB: May 17, 1965, 53 y.o.   MRN: [id number]  HPI  Pt presents to the clinic today for 6 month follow up of chronic conditions.  HTN: His BP today is 156/84. He is taking Amlodipine and Lisinopril as prescribed. ECG from 04/2013 reviewed.  DM 2: His last A1C was 7.1, 07/2017. He is taking Glipizide as prescribed. He does not monitor his sugars. He does not check his feet.  He reports numbness and tingling in his hands and feet, controlled with Gabapentin.  His is unsure when his last eye exam was. Flu 11/2017. Pneumovax 07/2016.  Chronic Joint Pain: Persistent despite Celebrex and Gabapentin.  He reports he did stop his Rosuvastatin and his pain has improved a little.  He has seen rheumatology and sports medicine in the past.  Depression: Chronic dysthymia. He is taking Fluoxetine, Wellbutrin and Lamictal as prescribed. He does not follow with psychiatry. He denies SI/HI.  GERD: He denies breakthrough on Pantoprazole.  There is no upper GI on file.  Hypogonadism: Testosterone level 8 months ago was low.  He has seen urology for the same and is interested in restarting testosterone injections.  He would like to know if that is something I could prescribe or does he have to see urology.  HLD: His last LDL was 59, triglycerides 295201, 07/2017.Marland Kitchen. He is taking Fish Oil as OTC.  He stopped taking his Rosuvastatin secondary to myalgias with some improvement.  He does not consume a low fat diet.  OSA: He averages 6-7 hours of sleep per night with the use of his CPAP.  He does not feel like his mask fits appropriately.  He feels rested when he wakes up. Sleep study from 12/2007 reviewed. He follows with [facility] pulmonology.  Review of Systems      Past Medical History:  Diagnosis Date  . Chronic pain 04/09/2011  . DEPRESSION 03/16/2007  . GERD (gastroesophageal reflux disease)   . HYPERTENSION 09/22/2006  . Hypogonadism male 04/09/2011  . Morbid  obesity (HCC) 09/22/2006  . Osteoarthritis 04/09/2011  . Sleep apnea    uses a cpap-will use after surgery  . Wears glasses     Current Outpatient Medications  Medication Sig Dispense Refill  . Acetaminophen (TYLENOL ARTHRITIS PAIN PO) Take by mouth as needed.    Marland Kitchen. amLODipine (NORVASC) 10 MG tablet TAKE 1 TABLET BY MOUTH ONCE DAILY 90 tablet 1  . buPROPion (WELLBUTRIN SR) 150 MG 12 hr tablet   0  . celecoxib (CELEBREX) 100 MG capsule TAKE 1 CAPSULE BY MOUTH TWICE DAILY 60 capsule 5  . cetirizine (ZYRTEC) 10 MG tablet Take 10 mg by mouth daily.    . Cholecalciferol (VITAMIN D3) 1000 units CAPS Take 1 capsule by mouth daily.    . clindamycin (CLEOCIN T) 1 % lotion   1  . fluocinonide cream (LIDEX) 0.05 %   1  . FLUoxetine (PROZAC) 40 MG capsule TAKE 1 CAPSULE BY MOUTH ONCE DAILY 90 capsule 0  . fluticasone (KLS ALLER-FLO) 50 MCG/ACT nasal spray Place 2 sprays into both nostrils as needed.     . gabapentin (NEURONTIN) 300 MG capsule Take 1 capsule (300 mg total) by mouth at bedtime. 30 capsule 3  . glipiZIDE (GLUCOTROL) 10 MG tablet TAKE 1 TABLET BY MOUTH TWICE (2) DAILY BEFORE A MEAL 60 tablet 0  . lamoTRIgine (LAMICTAL) 100 MG tablet TAKE 1 TABLET BY MOUTH EVERY MORNING 30 tablet 5  .  lisinopril (PRINIVIL,ZESTRIL) 10 MG tablet TAKE 1 TABLET BY MOUTH ONCE DAILY 90 tablet 0  . MEGARED OMEGA-3 KRILL OIL PO Take 750 mg by mouth daily.    . Misc Natural Products (NF FORMULAS TESTOSTERONE) CAPS Take 1 capsule by mouth 2 (two) times daily.    . pantoprazole (PROTONIX) 40 MG tablet TAKE 1 TABLET BY MOUTH ONCE A DAY 30 tablet 2  . rosuvastatin (CRESTOR) 10 MG tablet TAKE 1 TABLET BY MOUTH ONCE DAILY 30 tablet 2  . tiZANidine (ZANAFLEX) 4 MG capsule Take 1 capsule (4 mg total) by mouth at bedtime as needed for muscle spasms. 30 capsule 0   No current facility-administered medications for this visit.     Allergies  Allergen Reactions  . Penicillins     REACTION: unspecified    Family History    Problem Relation Age of Onset  . Cancer Mother        lymphoma  . Heart disease Father   . Hypertension Father   . Diabetes Sister   . Stroke Paternal Uncle   . Diabetes Maternal Grandmother   . Arthritis Paternal Grandmother   . Heart disease Paternal Grandfather   . Migraines Daughter   . Irritable bowel syndrome Daughter   . Heart Problems Son        valve     Social History   Socioeconomic History  . Marital status: Married    Spouse name: Not on file  . Number of children: Not on file  . Years of education: Not on file  . Highest education level: Not on file  Occupational History  . Not on file  Social Needs  . Financial resource strain: Not on file  . Food insecurity:    Worry: Not on file    Inability: Not on file  . Transportation needs:    Medical: Not on file    Non-medical: Not on file  Tobacco Use  . Smoking status: Never Smoker  . Smokeless tobacco: Current User    Types: Chew  Substance and Sexual Activity  . Alcohol use: Yes    Comment: rare  . Drug use: Never  . Sexual activity: Yes  Lifestyle  . Physical activity:    Days per week: Not on file    Minutes per session: Not on file  . Stress: Not on file  Relationships  . Social connections:    Talks on phone: Not on file    Gets together: Not on file    Attends religious service: Not on file    Active member of club or organization: Not on file    Attends meetings of clubs or organizations: Not on file    Relationship status: Not on file  . Intimate partner violence:    Fear of current or ex partner: Not on file    Emotionally abused: Not on file    Physically abused: Not on file    Forced sexual activity: Not on file  Other Topics Concern  . Not on file  Social History Narrative  . Not on file     Constitutional: Patient reports fatigue.  Denies fever, malaise, headache or abrupt weight changes.  HEENT: Denies eye pain, eye redness, ear pain, ringing in the ears, wax buildup,  runny nose, nasal congestion, bloody nose, or sore throat. Respiratory: Denies difficulty breathing, shortness of breath, cough or sputum production.   Cardiovascular: Patient reports chest pressure.  Denies chest pain, palpitations or swelling in the hands or feet.  Gastrointestinal: Denies abdominal pain, bloating, constipation, diarrhea or blood in the stool.  GU: Denies urgency, frequency, pain with urination, burning sensation, blood in urine, odor or discharge. Musculoskeletal: Patient reports chronic joint pain.  Denies decrease in range of motion, difficulty with gait, muscle pain or joint swelling.  Skin: Denies redness, rashes, lesions or ulcercations.  Neurological: Patient reports numbness and tingling in hands and feet.  Denies dizziness, difficulty with memory, difficulty with speech or problems with balance and coordination.  Psych: Patient has a history of depression.  Denies anxiety, SI/HI.  No other specific complaints in a complete review of systems (except as listed in HPI above).  Objective:   Physical Exam   BP (!) 156/84   Pulse 76   Temp 97.8 F (36.6 C) (Oral)   Wt (!) 358 lb (162.4 kg)   SpO2 97%   BMI 47.23 kg/m  Wt Readings from Last 3 Encounters:  03/14/18 (!) 358 lb (162.4 kg)  02/20/18 (!) 359 lb 4 oz (163 kg)  11/02/17 (!) 358 lb 8 oz (162.6 kg)    General: Appears his stated age, obese, in NAD. Skin: Warm, dry and intact. No ulcerations noted. Cardiovascular: Normal rate and rhythm. S1,S2 noted.  No murmur, rubs or gallops noted. No JVD or BLE edema.  Pulmonary/Chest: Normal effort and positive vesicular breath sounds. No respiratory distress. No wheezes, rales or ronchi noted.  Abdomen: Soft and nontender. Normal bowel sounds.  Musculoskeletal: Strength 5/5 BUE/BLE.  No difficulty with gait.  Neurological: Alert and oriented.  Sensation intact but diminished to BLE. Psychiatric: Mood and affect normal. Behavior is normal. Judgment and thought  content normal.    BMET    Component Value Date/Time   NA 137 08/23/2017 1024   K 4.1 08/23/2017 1024   CL 101 08/23/2017 1024   CO2 28 08/23/2017 1024   GLUCOSE 118 (H) 08/23/2017 1024   BUN 12 08/23/2017 1024   CREATININE 1.03 08/23/2017 1024   CALCIUM 9.2 08/23/2017 1024   GFRNONAA >90 05/15/2013 0922   GFRAA >90 05/15/2013 0922    Lipid Panel     Component Value Date/Time   CHOL 121 08/23/2017 1024   TRIG 201.0 (H) 08/23/2017 1024   HDL 29.00 (L) 08/23/2017 1024   CHOLHDL 4 08/23/2017 1024   VLDL 40.2 (H) 08/23/2017 1024   LDLCALC 74 12/05/2006 0846    CBC    Component Value Date/Time   WBC 4.5 08/23/2017 1024   RBC 4.75 08/23/2017 1024   HGB 12.7 (L) 08/23/2017 1024   HCT 37.8 (L) 08/23/2017 1024   PLT 202.0 08/23/2017 1024   MCV 79.5 08/23/2017 1024   MCHC 33.5 08/23/2017 1024   RDW 14.7 08/23/2017 1024    Hgb A1C Lab Results  Component Value Date   HGBA1C 7.5 (A) 03/14/2018          Assessment & Plan:  Chest pressure:  Indication for ECG: Chest pressure, hypertension, HLD, DM 2 Interpretation: Negative T waves in leads I, aVL, V4, V5 and V6, no concern for acute ischemia Comparison: 04/2013, unchanged Will refer to cardiology for further evaluation  RTC in 6 months for annual exam. Nicki Reaper, NP

## 2018-03-15 ENCOUNTER — Encounter: Payer: Self-pay | Admitting: Internal Medicine

## 2018-03-15 NOTE — Assessment & Plan Note (Signed)
He likely needs new equipment, and likely a new sleep study. He is not interested in having another sleep study at this time. Continue CPAP at night Encouraged weight loss and discussed how this could help improve his sleep apnea. He will continue to follow with [facility] pulmonology.

## 2018-03-15 NOTE — Patient Instructions (Signed)
Fat and Cholesterol Restricted Eating Plan Getting too much fat and cholesterol in your diet may cause health problems. Choosing the right foods helps keep your fat and cholesterol at normal levels. This can keep you from getting certain diseases. Your doctor may recommend an eating plan that includes:  Total fat: ______% or less of total calories a day.  Saturated fat: ______% or less of total calories a day.  Cholesterol: less than _________mg a day.  Fiber: ______g a day. What are tips for following this plan? Meal planning  At meals, divide your plate into four equal parts: ? Fill one-half of your plate with vegetables and green salads. ? Fill one-fourth of your plate with whole grains. ? Fill one-fourth of your plate with low-fat (lean) protein foods.  Eat fish that is high in omega-3 fats at least two times a week. This includes mackerel, tuna, sardines, and salmon.  Eat foods that are high in fiber, such as whole grains, beans, apples, broccoli, carrots, peas, and barley. General tips   Work with your doctor to lose weight if you need to.  Avoid: ? Foods with added sugar. ? Fried foods. ? Foods with partially hydrogenated oils.  Limit alcohol intake to no more than 1 drink a day for nonpregnant women and 2 drinks a day for men. One drink equals 12 oz of beer, 5 oz of wine, or 1 oz of hard liquor. Reading food labels  Check food labels for: ? Trans fats. ? Partially hydrogenated oils. ? Saturated fat (g) in each serving. ? Cholesterol (mg) in each serving. ? Fiber (g) in each serving.  Choose foods with healthy fats, such as: ? Monounsaturated fats. ? Polyunsaturated fats. ? Omega-3 fats.  Choose grain products that have whole grains. Look for the word "whole" as the first word in the ingredient list. Cooking  Cook foods using low-fat methods. These include baking, boiling, grilling, and broiling.  Eat more home-cooked foods. Eat at restaurants and buffets  less often.  Avoid cooking using saturated fats, such as butter, cream, palm oil, palm kernel oil, and coconut oil. Recommended foods  Fruits  All fresh, canned (in natural juice), or frozen fruits. Vegetables  Fresh or frozen vegetables (raw, steamed, roasted, or grilled). Green salads. Grains  Whole grains, such as whole wheat or whole grain breads, crackers, cereals, and pasta. Unsweetened oatmeal, bulgur, barley, quinoa, or brown rice. Corn or whole wheat flour tortillas. Meats and other protein foods  Ground beef (85% or leaner), grass-fed beef, or beef trimmed of fat. Skinless chicken or turkey. Ground chicken or turkey. Pork trimmed of fat. All fish and seafood. Egg whites. Dried beans, peas, or lentils. Unsalted nuts or seeds. Unsalted canned beans. Nut butters without added sugar or oil. Dairy  Low-fat or nonfat dairy products, such as skim or 1% milk, 2% or reduced-fat cheeses, low-fat and fat-free ricotta or cottage cheese, or plain low-fat and nonfat yogurt. Fats and oils  Tub margarine without trans fats. Light or reduced-fat mayonnaise and salad dressings. Avocado. Olive, canola, sesame, or safflower oils. The items listed above may not be a complete list of foods and beverages you can eat. Contact a dietitian for more information. Foods to avoid Fruits  Canned fruit in heavy syrup. Fruit in cream or butter sauce. Fried fruit. Vegetables  Vegetables cooked in cheese, cream, or butter sauce. Fried vegetables. Grains  White bread. White pasta. White rice. Cornbread. Bagels, pastries, and croissants. Crackers and snack foods that contain trans fat   and hydrogenated oils. Meats and other protein foods  Fatty cuts of meat. Ribs, chicken wings, bacon, sausage, bologna, salami, chitterlings, fatback, hot dogs, bratwurst, and packaged lunch meats. Liver and organ meats. Whole eggs and egg yolks. Chicken and turkey with skin. Fried meat. Dairy  Whole or 2% milk, cream,  half-and-half, and cream cheese. Whole milk cheeses. Whole-fat or sweetened yogurt. Full-fat cheeses. Nondairy creamers and whipped toppings. Processed cheese, cheese spreads, and cheese curds. Beverages  Alcohol. Sugar-sweetened drinks such as sodas, lemonade, and fruit drinks. Fats and oils  Butter, stick margarine, lard, shortening, ghee, or bacon fat. Coconut, palm kernel, and palm oils. Sweets and desserts  Corn syrup, sugars, honey, and molasses. Candy. Jam and jelly. Syrup. Sweetened cereals. Cookies, pies, cakes, donuts, muffins, and ice cream. The items listed above may not be a complete list of foods and beverages you should avoid. Contact a dietitian for more information. Summary  Choosing the right foods helps keep your fat and cholesterol at normal levels. This can keep you from getting certain diseases.  At meals, fill one-half of your plate with vegetables and green salads.  Eat high-fiber foods, like whole grains, beans, apples, carrots, peas, and barley.  Limit added sugar, saturated fats, alcohol, and fried foods. This information is not intended to replace advice given to you by your health care provider. Make sure you discuss any questions you have with your health care provider. Document Released: 08/17/2011 Document Revised: 10/19/2017 Document Reviewed: 11/02/2016 Elsevier Interactive Patient Education  2019 [employer] Inc[employer]   

## 2018-03-15 NOTE — Assessment & Plan Note (Signed)
A1c today-7.5% No microalbumin secondary to ACE therapy Encouraged him to consume a low carb diet and exercise for weight loss Continue Glipizide and Gabapentin for now Consider adding Januvia if next A1c higher Encouraged yearly eye exam Foot exam today Flu and Pneumovax up-to-date

## 2018-03-15 NOTE — Assessment & Plan Note (Signed)
He will continue Celebrex and Gabapentin for now Discussed how weight loss could help improve joint pain

## 2018-03-15 NOTE — Assessment & Plan Note (Signed)
Chronic but stable on Fluoxetine, Wellbutrin and Lamictal Support offered today We will monitor

## 2018-03-15 NOTE — Assessment & Plan Note (Signed)
He is not interested in weaning Pantoprazole at this time. Discussed avoiding foods that trigger reflux Discussed how weight loss could help improve reflux.

## 2018-03-15 NOTE — Assessment & Plan Note (Signed)
Elevated, but it has not even been an hour since he took his blood pressure medicine On Lisinopril, could consider increasing dose to 20 mg daily but will have him see cardiology first Reinforced DASH diet and exercise for weight loss Kidney function normal

## 2018-03-15 NOTE — Assessment & Plan Note (Signed)
Testosterone level reviewed We will start testosterone 200 mg IM, 1 injection biweekly for the first month, then monthly for 2 months. Discussed associated risk including hypertension, heart attack, stroke and prostate cancer We will recheck testosterone in 3 months lab only

## 2018-03-15 NOTE — Assessment & Plan Note (Signed)
He stopped rosuvastatin secondary to increased joint pain, with some relief Encouraged him to consume a low saturated fat diet and increase aerobic exercise. Continue Fish Oil for now We will repeat lipid profile in 3 months, lab only-if elevated will need to restart statin therapy

## 2018-03-20 ENCOUNTER — Ambulatory Visit: Payer: BLUE CROSS/BLUE SHIELD | Admitting: Cardiovascular Disease

## 2018-03-20 ENCOUNTER — Encounter: Payer: Self-pay | Admitting: Cardiovascular Disease

## 2018-03-20 VITALS — BP 140/78 | HR 78 | Ht 72.0 in | Wt 359.5 lb

## 2018-03-20 DIAGNOSIS — R0602 Shortness of breath: Secondary | ICD-10-CM

## 2018-03-20 DIAGNOSIS — R079 Chest pain, unspecified: Secondary | ICD-10-CM | POA: Diagnosis not present

## 2018-03-20 DIAGNOSIS — R600 Localized edema: Secondary | ICD-10-CM

## 2018-03-20 DIAGNOSIS — Z8249 Family history of ischemic heart disease and other diseases of the circulatory system: Secondary | ICD-10-CM

## 2018-03-20 MED ORDER — POTASSIUM CHLORIDE ER 10 MEQ PO TBCR: 10.0000 meq | EXTENDED_RELEASE_TABLET | Freq: Every day | ORAL | 6 refills | Status: DC

## 2018-03-20 MED ORDER — LISINOPRIL-HYDROCHLOROTHIAZIDE 20-12.5 MG PO TABS: 2.0000 | ORAL_TABLET | Freq: Every day | ORAL | 6 refills | Status: DC

## 2018-03-20 MED ORDER — EZETIMIBE 10 MG PO TABS: 10.0000 mg | ORAL_TABLET | Freq: Every day | ORAL | 3 refills | Status: DC

## 2018-03-20 NOTE — Patient Instructions (Addendum)
Look for N95 mask   Medication Instructions:  Stop amlodipine Start lisinopril HCTZ  40/25 mg daily (take 2 of the 20/12.5 mg pills)  Stay off the crestor Start zetia one a day for cholesterol (we could use low crestor in the future 5 mg)  Potassium once a day.  If you need a refill on your cardiac medications before your next appointment, please call your pharmacy.    Lab work: Your physician recommends that you return for lab work in: 2-3 WEEKS AT THE [facility]. - Please go to the [facility]. You will check in at the front desk to the right as you walk into the atrium. Valet Parking is offered if needed.     If you have labs (blood work) drawn today and your tests are completely normal, you will receive your results only by: Marland Kitchen MyChart Message (if you have MyChart) OR . A paper copy in the mail If you have any lab test that is abnormal or we need to change your treatment, we will call you to review the results.   Testing/Procedures: We will order a CT coronary calcium score for family history, chest pain $150 [phone] - Please call to schedule at your convenience.   Follow-Up: At [facility], you and your health needs are our priority.  As part of our continuing mission to provide you with exceptional heart care, we have created designated Provider Care Teams.  These Care Teams include your primary Cardiologist (physician) and Advanced Practice Providers (APPs -  Physician Assistants and Nurse Practitioners) who all work together to provide you with the care you need, when you need it.  . You will need a follow up appointment as needed  . Providers on your designated Care Team:   . Nicolasa Ducking, NP . Eula Listen, PA-C . Marisue Ivan, PA-C  Any Other Special Instructions Will Be Listed Below (If Applicable).  For educational health videos Log in to : [email or web address] Or : [email or web address], password : [email or web address]

## 2018-03-20 NOTE — Progress Notes (Signed)
Cardiology Office Note  Date:  03/20/2018   ID:  Anthony Castillo, DOB 1965/06/29, MRN [id number]  PCP:  Lorre Munroe, NP   Chief Complaint  Patient presents with  . other    Ref by Nicki Reaper, NP for chest pressure. Pt. c/o chest pressure, LE edema and shortness of breath. Pt. stopped the Crestor due to joint pain and muscle aches. Meds reviewed by the pt. verbally.     HPI:  Anthony Castillo is a 53 year old gentleman with history of Morbid obesity Chronic shortness of breath Hyperlipidemia Hypertension Strong family history of coronary artery disease Sleep apnea Osteoarthritis Who presents by referral from [facility] for consultation of his chest pain, shortness of breath, leg swelling  Reports he has periodic left-sided chest pain Does not seem to coincide with exertion but comes and goes Sometimes hurts when he takes a breath in Has not noticed a correlation with exertion or exercise He is concerned given strong family history of coronary disease  Also reports having chronic shortness of breath for over the past year or more No regular exercise program, weight has been trending upwards 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 Again concerned this could be a sign of a blockage  Troubled by lower extremity edema Worse after he has been sitting for long periods of time or standing Concerned this could be secondary to fluid overload  Recently stopped his Crestor 10 mg daily Thought it might be contributing to diffuse joint pain Thinks he may feel better off the Crestor  EKG personally reviewed by myself on todays visit Shows normal sinus rhythm with rate 78 bpm T wave abnormality V3 through V6 T wave abnormality dating back to 2009   PMH:   has a past medical history of Chronic pain (04/09/2011), DEPRESSION (03/16/2007), GERD (gastroesophageal reflux disease), HYPERTENSION (09/22/2006), Hypogonadism male (04/09/2011),  Morbid obesity (HCC) (09/22/2006), Osteoarthritis (04/09/2011), Sleep apnea, and Wears glasses.  PSH:    Past Surgical History:  Procedure Laterality Date  . CARPAL TUNNEL RELEASE Right 05/17/2013   Procedure: RIGHT CARPAL TUNNEL RELEASE;  Surgeon: Wyn Forster., MD;  Location: [facility];  Service: Orthopedics;  Laterality: Right;  . CARPAL TUNNEL RELEASE Left 07/19/2013   Procedure: LEFT CARPAL TUNNEL RELEASE;  Surgeon: Wyn Forster., MD;  Location: [facility];  Service: Orthopedics;  Laterality: Left;  . FOOT SURGERY Right 01/2015  . KNEE ARTHROSCOPY  6/14   right  . TONSILLECTOMY    . ULNAR NERVE TRANSPOSITION Right 05/17/2013   Procedure: RIGHT ULNAR NERVE DECOMPRESSION;  Surgeon: Wyn Forster., MD;  Location: [facility];  Service: Orthopedics;  Laterality: Right;  . WISDOM TOOTH EXTRACTION      Current Outpatient Medications  Medication Sig Dispense Refill  . Acetaminophen (TYLENOL ARTHRITIS PAIN PO) Take by mouth as needed.    Marland Kitchen buPROPion (WELLBUTRIN SR) 150 MG 12 hr tablet   0  . celecoxib (CELEBREX) 100 MG capsule TAKE 1 CAPSULE BY MOUTH TWICE DAILY 60 capsule 5  . cetirizine (ZYRTEC) 10 MG tablet Take 10 mg by mouth daily.    . Cholecalciferol (VITAMIN D3) 1000 units CAPS Take 1 capsule by mouth daily.    . clindamycin (CLEOCIN T) 1 % lotion   1  . fluocinonide cream (LIDEX) 0.05 %   1  . FLUoxetine (PROZAC) 40 MG capsule TAKE 1 CAPSULE BY MOUTH  ONCE DAILY 90 capsule 0  . fluticasone (KLS ALLER-FLO) 50 MCG/ACT nasal spray Place 2 sprays into both nostrils as needed.     . gabapentin (NEURONTIN) 300 MG capsule Take 1 capsule (300 mg total) by mouth at bedtime. (Patient taking differently: Take 300 mg by mouth 3 times/day as needed-between meals & bedtime. ) 30 capsule 3  . glipiZIDE (GLUCOTROL) 10 MG tablet TAKE 1 TABLET BY MOUTH TWICE (2) DAILY BEFORE A MEAL 60 tablet 0  . lamoTRIgine (LAMICTAL) 100 MG tablet TAKE 1  TABLET BY MOUTH EVERY MORNING 30 tablet 5  . MEGARED OMEGA-3 KRILL OIL PO Take 750 mg by mouth daily.    . Misc Natural Products (NF FORMULAS TESTOSTERONE) CAPS Take 1 capsule by mouth 2 (two) times daily.    . pantoprazole (PROTONIX) 40 MG tablet TAKE 1 TABLET BY MOUTH ONCE A DAY 30 tablet 2  . Syringe/Needle, Disp, (SYRINGE 3CC/20GX1") 20G X 1" 3 ML MISC 1 Dose by Does not apply route every 14 (fourteen) days. 12 each 0  . testosterone cypionate (DEPOTESTOSTERONE CYPIONATE) 200 MG/ML injection Give 1 mL IM every 14 days for first 2 doses, then give 1 mL IM every 30 days thereafter 10 mL 0  . ezetimibe (ZETIA) 10 MG tablet Take 1 tablet (10 mg total) by mouth daily. 90 tablet 3  . lisinopril-hydrochlorothiazide (PRINZIDE,ZESTORETIC) 20-12.5 MG tablet Take 2 tablets by mouth daily. 60 tablet 6  . potassium chloride (K-DUR) 10 MEQ tablet Take 1 tablet (10 mEq total) by mouth daily. 30 tablet 6   No current facility-administered medications for this visit.      Allergies:   Penicillins   Social History:  The patient  reports that he has never smoked. His smokeless tobacco use includes chew. He reports current alcohol use. He reports that he does not use drugs.   Family History:   family history includes Arthritis in his paternal grandmother; Cancer in his mother; Diabetes in his maternal grandmother and sister; Heart Problems in his son; Heart disease in his father and paternal grandfather; Hypertension in his father; Irritable bowel syndrome in his daughter; Migraines in his daughter; Stroke in his paternal uncle.    Review of Systems: Review of Systems  Constitutional: Negative.   Respiratory: Positive for shortness of breath.   Cardiovascular: Positive for chest pain and leg swelling.  Gastrointestinal: Negative.   Musculoskeletal: Negative.   Neurological: Negative.   Psychiatric/Behavioral: Negative.   All other systems reviewed and are negative.    PHYSICAL EXAM: VS:  BP 140/78  (BP Location: Right Arm, Patient Position: Sitting, Cuff Size: Large)   Pulse 78   Ht 6' (1.829 m)   Wt (!) 359 lb 8 oz (163.1 kg)   BMI 48.76 kg/m  , BMI Body mass index is 48.76 kg/m. GEN: Well nourished, well developed, in no acute distress  HEENT: normal  Neck: no JVD, carotid bruits, or masses Cardiac: RRR; no murmurs, rubs, or gallops,no edema  Respiratory:  clear to auscultation bilaterally, normal work of breathing GI: soft, nontender, nondistended, + BS MS: no deformity or atrophy  Skin: warm and dry, no rash Neuro:  Strength and sensation are intact Psych: euthymic mood, full affect   Recent Labs: 05/24/2017: TSH 1.80 08/23/2017: ALT 33; BUN 12; Creatinine, Ser 1.03; Hemoglobin 12.7; Platelets 202.0; Potassium 4.1; Sodium 137    Lipid Panel Lab Results  Component Value Date   CHOL 121 08/23/2017   HDL 29.00 (L) 08/23/2017   LDLCALC 74  12/05/2006   TRIG 201.0 (H) 08/23/2017      Wt Readings from Last 3 Encounters:  03/20/18 (!) 359 lb 8 oz (163.1 kg)  03/14/18 (!) 358 lb (162.4 kg)  02/20/18 (!) 359 lb 4 oz (163 kg)       ASSESSMENT AND PLAN:  Chest pain with moderate risk for cardiac etiology -  Somewhat atypical features but he reports strong family history Not very active at baseline Discussed various risk stratification studies with him including treadmill or pharmacological Myoview Also discussed CT coronary calcium scoring Given his size, would likely have significant attenuation artifact on Myoview He prefers to have CT coronary calcium scan  to start If score is markedly low, would not proceed with further testing unless he develops worsening symptoms Currently symptoms are mild, somewhat nonspecific, suspect musculoskeletal   Lower extremity edema Lower extremity edema likely venous issue, no pitting edema Recommend he hold the amlodipine Wear compression hose Symptoms likely exacerbated by his weight  shortness of breath - Plan: EKG  12-Lead Deconditioning, morbid obesity likely contributing Unable to exclude fluid accumulation and mild diastolic CHF Recommend he hold the amlodipine, start lisinopril HCTZ 40/25 mg daily We did discuss echocardiogram, he has declined at this time but will call us if symptoms get worse  Hypertension He reports blood pressure typically elevated at home Medication changes as above, hold amlodipine given leg swelling and start lisinopril HCTZ 40/25 mg daily, and potassium 10 daily with BMP check in several weeks time We 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 CT CARDIAC SCORING  . Basic metabolic panel  . EKG 12-Lead     Signed, Dossie Arbour, M.D., Ph.D. 03/20/2018  [facility] [place], [place] [phone]

## 2018-03-31 ENCOUNTER — Telehealth: Payer: Self-pay | Admitting: *Deleted

## 2018-03-31 NOTE — Telephone Encounter (Signed)
Representative from [facility] left a voicemail stating that someone requested a prior authorization on  Testosterone. The message left was that a PA was requested on an out dated form and needs to be resubmitted on the correct form and requested a call back.

## 2018-04-03 NOTE — Telephone Encounter (Signed)
PA for formulary exception and quantity limit faxed to [facility]... with labs---awaiting response

## 2018-04-05 NOTE — Telephone Encounter (Signed)
received fax that Testosterone has been approved.... approval letter faxed to [facility] pharmacy

## 2018-04-11 ENCOUNTER — Ambulatory Visit (INDEPENDENT_AMBULATORY_CARE_PROVIDER_SITE_OTHER)
Admission: RE | Admit: 2018-04-11 | Discharge: 2018-04-11 | Disposition: A | Discharge status: Home / Self Care | Payer: Self-pay | Source: Ambulatory Visit | Attending: Cardiovascular Disease | Admitting: Cardiovascular Disease

## 2018-04-11 ENCOUNTER — Other Ambulatory Visit: Payer: Self-pay | Admitting: Internal Medicine

## 2018-04-11 DIAGNOSIS — Z8249 Family history of ischemic heart disease and other diseases of the circulatory system: Secondary | ICD-10-CM

## 2018-04-11 DIAGNOSIS — M2550 Pain in unspecified joint: Secondary | ICD-10-CM

## 2018-04-11 DIAGNOSIS — R079 Chest pain, unspecified: Secondary | ICD-10-CM

## 2018-04-11 DIAGNOSIS — M791 Myalgia, unspecified site: Secondary | ICD-10-CM

## 2018-04-11 NOTE — Telephone Encounter (Signed)
Lamictal last filled 07/2017 with 5 refills... please advise if okay to refill

## 2018-04-13 DIAGNOSIS — G4733 Obstructive sleep apnea (adult) (pediatric): Secondary | ICD-10-CM | POA: Diagnosis not present

## 2018-04-18 ENCOUNTER — Telehealth: Payer: Self-pay | Admitting: *Deleted

## 2018-04-18 MED ORDER — ATORVASTATIN CALCIUM 20 MG PO TABS: 20.0000 mg | ORAL_TABLET | Freq: Every day | ORAL | 3 refills | Status: DC

## 2018-04-18 NOTE — Telephone Encounter (Signed)
Spoke with patient and reviewed results and recommendations. He verbalized understanding but reports that he was on crestor and it caused him joint pain. He was agreeable to try Lipitor and reviewed that if he should have any symptoms to let us know. He verbalized understanding of our conversation, agreement with plan, and had no further questions at this time.

## 2018-04-18 NOTE — Telephone Encounter (Signed)
-----   Message from Antonieta Iba, MD sent at 04/17/2018  9:41 PM EST ----- CT coronary calcium score Score of 100 is low, but very elevated for his age, 90% percentile Would restart crestor 10 daily or we could call in lipitor 20 daily

## 2018-05-08 ENCOUNTER — Other Ambulatory Visit: Payer: Self-pay | Admitting: Internal Medicine

## 2018-05-12 DIAGNOSIS — G4733 Obstructive sleep apnea (adult) (pediatric): Secondary | ICD-10-CM | POA: Diagnosis not present

## 2018-06-12 DIAGNOSIS — G4733 Obstructive sleep apnea (adult) (pediatric): Secondary | ICD-10-CM | POA: Diagnosis not present

## 2018-06-14 ENCOUNTER — Encounter: Payer: Self-pay | Admitting: Internal Medicine

## 2018-06-14 ENCOUNTER — Ambulatory Visit (INDEPENDENT_AMBULATORY_CARE_PROVIDER_SITE_OTHER): Payer: BLUE CROSS/BLUE SHIELD | Admitting: Internal Medicine

## 2018-06-14 ENCOUNTER — Other Ambulatory Visit: Payer: Self-pay | Admitting: Internal Medicine

## 2018-06-14 DIAGNOSIS — E119 Type 2 diabetes mellitus without complications: Secondary | ICD-10-CM | POA: Diagnosis not present

## 2018-06-14 DIAGNOSIS — E291 Testicular hypofunction: Secondary | ICD-10-CM

## 2018-06-14 NOTE — Assessment & Plan Note (Signed)
Will have him make lab only appt for A1C No microalbumin secondary to ACEI therapy Encouraged him to consume a low carb diet and exercise for weight loss Encouraged daily foot exam Encouraged yearly eye exam Continue Glipizide for now Flu and pneumovax are UTD

## 2018-06-14 NOTE — Patient Instructions (Signed)
Testosterone injection  What is this medicine?  TESTOSTERONE (tes TOS ter one) is the main male hormone. It supports normal male development such as muscle growth, facial hair, and deep voice. It is used in males to treat low testosterone levels.  This medicine may be used for other purposes; ask your health care provider or pharmacist if you have questions.  COMMON BRAND NAME(S): Andro-L.A., Aveed, Delatestryl, Depo-Testosterone, Virilon  What should I tell my health care provider before I take this medicine?  They need to know if you have any of these conditions:  -cancer  -diabetes  -heart disease  -kidney disease  -liver disease  -lung disease  -prostate disease  -an unusual or allergic reaction to testosterone, other medicines, foods, dyes, or preservatives  -pregnant or trying to get pregnant  -breast-feeding  How should I use this medicine?  This medicine is for injection into a muscle. It is usually given by a health care professional in a hospital or clinic setting.  Contact your pediatrician regarding the use of this medicine in children. While this medicine may be prescribed for children as young as 12 years of age for selected conditions, precautions do apply.  Overdosage: If you think you have taken too much of this medicine contact a poison control center or emergency room at once.  NOTE: This medicine is only for you. Do not share this medicine with others.  What if I miss a dose?  Try not to miss a dose. Your doctor or health care professional will tell you when your next injection is due. Notify the office if you are unable to keep an appointment.  What may interact with this medicine?  -medicines for diabetes  -medicines that treat or prevent blood clots like warfarin  -oxyphenbutazone  -propranolol  -steroid medicines like prednisone or cortisone  This list may not describe all possible interactions. Give your health care provider a list of all the medicines, herbs, non-prescription drugs, or  dietary supplements you use. Also tell them if you smoke, drink alcohol, or use illegal drugs. Some items may interact with your medicine.  What should I watch for while using this medicine?  Visit your doctor or health care professional for regular checks on your progress. They will need to check the level of testosterone in your blood.  This medicine is only approved for use in men who have low levels of testosterone related to certain medical conditions. Heart attacks and strokes have been reported with the use of this medicine. Notify your doctor or health care professional and seek emergency treatment if you develop breathing problems; changes in vision; confusion; chest pain or chest tightness; sudden arm pain; severe, sudden headache; trouble speaking or understanding; sudden numbness or weakness of the face, arm or leg; loss of balance or coordination. Talk to your doctor about the risks and benefits of this medicine.  This medicine may affect blood sugar levels. If you have diabetes, check with your doctor or health care professional before you change your diet or the dose of your diabetic medicine.  Testosterone injections are not commonly used in women. Women should inform their doctor if they wish to become pregnant or think they might be pregnant. There is a potential for serious side effects to an unborn child. Talk to your health care professional or pharmacist for more information. Talk with your doctor or health care professional about your birth control options while taking this medicine.  This drug is banned from use in athletes   by most athletic organizations.  What side effects may I notice from receiving this medicine?  Side effects that you should report to your doctor or health care professional as soon as possible:  -allergic reactions like skin rash, itching or hives, swelling of the face, lips, or tongue  -breast enlargement  -breathing problems  -changes in emotions or moods  -deep or  hoarse voice  -irregular menstrual periods  -signs and symptoms of liver injury like dark yellow or brown urine; general ill feeling or flu-like symptoms; light-colored stools; loss of appetite; nausea; right upper belly pain; unusually weak or tired; yellowing of the eyes or skin  -stomach pain  -swelling of the ankles, feet, hands  -too frequent or persistent erections  -trouble passing urine or change in the amount of urine  Side effects that usually do not require medical attention (report to your doctor or health care professional if they continue or are bothersome):  -acne  -change in sex drive or performance  -facial hair growth  -hair loss  -headache  This list may not describe all possible side effects. Call your doctor for medical advice about side effects. You may report side effects to FDA at 1-800-FDA-1088.  Where should I keep my medicine?  Keep out of the reach of children. This medicine can be abused. Keep your medicine in a safe place to protect it from theft. Do not share this medicine with anyone. Selling or giving away this medicine is dangerous and against the law.  Store at room temperature between 20 and 25 degrees C (68 and 77 degrees F). Do not freeze. Protect from light. Follow the directions for the product you are prescribed. Throw away any unused medicine after the expiration date.  NOTE: This sheet is a summary. It may not cover all possible information. If you have questions about this medicine, talk to your doctor, pharmacist, or health care provider.   2019 Elsevier/Gold Standard (2015-03-22 07:33:55)

## 2018-06-14 NOTE — Assessment & Plan Note (Signed)
Continue testosterone injections Will have him make lab only appt for testosterone level before 10 am

## 2018-06-14 NOTE — Progress Notes (Signed)
Virtual Visit via Video Note  I connected with [name] on 06/14/18 at  3:15 PM EDT by a video enabled telemedicine application and verified that I am speaking with the correct person using two identifiers.   I discussed the limitations of evaluation and management by telemedicine and the availability of in person appointments. The patient expressed understanding and agreed to proceed.  Patient Location: Home Provider Location: Office  Video visit failed due to patients poor signal. This conversation was continued over the phone.  History of Present Illness:  Pt due for follow up of testosterone replacement. Was initiated on Testosterone injections 03/2018 for fatigue. Since starting medication, he reports his fatigue is a little better, but not as severe.  He is also due for follow up of DM 2. His last A1C was 7.5%, 03/2018. He does not check his sugars at home. He takes Glipizide and Neurontin as prescribed. He does not routinely check his feet. His last eye exam was more than a year ago. Flu 11/2017. Pneumovax 07/2016.    Observations/Objective:  Alert and oriented x 3 No obvious shortness of breath Behavior, judgement and thought content are normal  Assessment and Plan:  See problem based charting.   Follow Up Instructions:    I discussed the assessment and treatment plan with the patient. The patient was provided an opportunity to ask questions and all were answered. The patient agreed with the plan and demonstrated an understanding of the instructions.   The patient was advised to call back or seek an in-person evaluation if the symptoms worsen or if the condition fails to improve as anticipated.     Nicki Reaper, NP

## 2018-06-15 ENCOUNTER — Other Ambulatory Visit (INDEPENDENT_AMBULATORY_CARE_PROVIDER_SITE_OTHER): Payer: BLUE CROSS/BLUE SHIELD

## 2018-06-15 DIAGNOSIS — E291 Testicular hypofunction: Secondary | ICD-10-CM | POA: Diagnosis not present

## 2018-06-15 DIAGNOSIS — E119 Type 2 diabetes mellitus without complications: Secondary | ICD-10-CM | POA: Diagnosis not present

## 2018-06-15 LAB — HEMOGLOBIN A1C: Hgb A1c MFr Bld: 8 % — ABNORMAL HIGH (ref 4.6–6.5)

## 2018-06-15 LAB — TESTOSTERONE: Testosterone: 180.45 ng/dL — ABNORMAL LOW (ref 300.00–890.00)

## 2018-06-16 ENCOUNTER — Other Ambulatory Visit: Payer: Self-pay | Admitting: Internal Medicine

## 2018-06-22 ENCOUNTER — Other Ambulatory Visit: Payer: Self-pay | Admitting: Internal Medicine

## 2018-06-23 MED ORDER — TESTOSTERONE CYPIONATE 200 MG/ML IM SOLN: 200.0000 mg | INTRAMUSCULAR | 0 refills | Status: DC

## 2018-06-23 NOTE — Telephone Encounter (Signed)
Patient's wife left a voicemail stating that they have requested a refill on his muscle relaxer from the pharmacy. Lynden Ang stated that the weekend is coming up and they would like it filled today.  Lynden Ang stated that he is working a lot and he is having muscle spawns.  [facility]

## 2018-07-07 ENCOUNTER — Other Ambulatory Visit: Payer: Self-pay | Admitting: Internal Medicine

## 2018-08-28 ENCOUNTER — Encounter: Payer: BLUE CROSS/BLUE SHIELD | Admitting: Internal Medicine

## 2018-08-29 ENCOUNTER — Encounter: Payer: BLUE CROSS/BLUE SHIELD | Admitting: Internal Medicine

## 2018-09-05 ENCOUNTER — Encounter: Payer: BC Managed Care – PPO | Admitting: Internal Medicine

## 2018-09-11 ENCOUNTER — Other Ambulatory Visit: Payer: Self-pay | Admitting: Internal Medicine

## 2018-09-12 ENCOUNTER — Ambulatory Visit (INDEPENDENT_AMBULATORY_CARE_PROVIDER_SITE_OTHER): Payer: BC Managed Care – PPO | Admitting: Internal Medicine

## 2018-09-12 ENCOUNTER — Encounter: Payer: Self-pay | Admitting: Internal Medicine

## 2018-09-12 ENCOUNTER — Other Ambulatory Visit: Payer: Self-pay

## 2018-09-12 VITALS — BP 158/92 | HR 65 | Temp 98.4°F | Ht 72.0 in | Wt 347.0 lb

## 2018-09-12 DIAGNOSIS — G4733 Obstructive sleep apnea (adult) (pediatric): Secondary | ICD-10-CM

## 2018-09-12 DIAGNOSIS — Z125 Encounter for screening for malignant neoplasm of prostate: Secondary | ICD-10-CM

## 2018-09-12 DIAGNOSIS — I10 Essential (primary) hypertension: Secondary | ICD-10-CM | POA: Diagnosis not present

## 2018-09-12 DIAGNOSIS — M8949 Other hypertrophic osteoarthropathy, multiple sites: Secondary | ICD-10-CM

## 2018-09-12 DIAGNOSIS — M150 Primary generalized (osteo)arthritis: Secondary | ICD-10-CM

## 2018-09-12 DIAGNOSIS — Z1211 Encounter for screening for malignant neoplasm of colon: Secondary | ICD-10-CM | POA: Diagnosis not present

## 2018-09-12 DIAGNOSIS — M159 Polyosteoarthritis, unspecified: Secondary | ICD-10-CM

## 2018-09-12 DIAGNOSIS — K219 Gastro-esophageal reflux disease without esophagitis: Secondary | ICD-10-CM

## 2018-09-12 DIAGNOSIS — E119 Type 2 diabetes mellitus without complications: Secondary | ICD-10-CM

## 2018-09-12 DIAGNOSIS — Z0000 Encounter for general adult medical examination without abnormal findings: Secondary | ICD-10-CM | POA: Diagnosis not present

## 2018-09-12 DIAGNOSIS — Z0001 Encounter for general adult medical examination with abnormal findings: Secondary | ICD-10-CM

## 2018-09-12 DIAGNOSIS — E291 Testicular hypofunction: Secondary | ICD-10-CM

## 2018-09-12 DIAGNOSIS — F324 Major depressive disorder, single episode, in partial remission: Secondary | ICD-10-CM

## 2018-09-12 DIAGNOSIS — E7800 Pure hypercholesterolemia, unspecified: Secondary | ICD-10-CM

## 2018-09-12 LAB — COMPREHENSIVE METABOLIC PANEL
ALT: 24 U/L (ref 0–53)
AST: 24 U/L (ref 0–37)
Albumin: 4.3 g/dL (ref 3.5–5.2)
Alkaline Phosphatase: 80 U/L (ref 39–117)
BUN: 14 mg/dL (ref 6–23)
CO2: 27 mEq/L (ref 19–32)
Calcium: 8.9 mg/dL (ref 8.4–10.5)
Chloride: 100 mEq/L (ref 96–112)
Creatinine, Ser: 1.04 mg/dL (ref 0.40–1.50)
GFR: 74.15 mL/min (ref >60.00)
Glucose, Bld: 161 mg/dL — ABNORMAL HIGH (ref 70–99)
Potassium: 4.1 mEq/L (ref 3.5–5.1)
Sodium: 136 mEq/L (ref 135–145)
Total Bilirubin: 0.8 mg/dL (ref 0.2–1.2)
Total Protein: 6.5 g/dL (ref 6.0–8.3)

## 2018-09-12 LAB — CBC
HCT: 40.3 % (ref 39.0–52.0)
Hemoglobin: 13.2 g/dL (ref 13.0–17.0)
MCHC: 32.8 g/dL (ref 30.0–36.0)
MCV: 80 fl (ref 78.0–100.0)
Platelets: 206 10*3/uL (ref 150.0–400.0)
RBC: 5.04 Mil/uL (ref 4.22–5.81)
RDW: 15.3 % (ref 11.5–15.5)
WBC: 5.3 10*3/uL (ref 4.0–10.5)

## 2018-09-12 LAB — LIPID PANEL
Cholesterol: 102 mg/dL (ref 0–200)
HDL: 29.2 mg/dL — ABNORMAL LOW (ref >39.00)
LDL Cholesterol: 38 mg/dL (ref 0–99)
NonHDL: 72.73
Total CHOL/HDL Ratio: 3
Triglycerides: 172 mg/dL — ABNORMAL HIGH (ref 0.0–149.0)
VLDL: 34.4 mg/dL (ref 0.0–40.0)

## 2018-09-12 LAB — PSA: PSA: 0.62 ng/mL (ref 0.10–4.00)

## 2018-09-12 LAB — TESTOSTERONE: Testosterone: 225.85 ng/dL — ABNORMAL LOW (ref 300.00–890.00)

## 2018-09-12 LAB — HEMOGLOBIN A1C: Hgb A1c MFr Bld: 8.7 % — ABNORMAL HIGH (ref 4.6–6.5)

## 2018-09-12 MED ORDER — AMLODIPINE BESYLATE 10 MG PO TABS: 10.0000 mg | ORAL_TABLET | Freq: Every day | ORAL | 1 refills | Status: DC

## 2018-09-12 NOTE — Assessment & Plan Note (Addendum)
Currently controlled on Omeprazole. CBC and CMET today Discussed avoiding foods that trigger reflux.  Discussed how weight loss could help improve reflux.

## 2018-09-12 NOTE — Assessment & Plan Note (Addendum)
Support offered today. Continue Fluoxetine, Wellbutrin, and Lamictal as prescribed. CMET today

## 2018-09-12 NOTE — Assessment & Plan Note (Addendum)
Testosterone levels checked today. Continue Testosterone injections at home for now.

## 2018-09-12 NOTE — Assessment & Plan Note (Signed)
Currently taking Atorvastatin and Fish Oil as prescribed.  Lipids, CMP checked today. Continue on current dose for now until labs resulted. Reinforced low fat diet and regular exercise.

## 2018-09-12 NOTE — Assessment & Plan Note (Addendum)
Last sleep study in 2009.   Still fatigues some throughout the day.   Referral to Pulmonology complete for repeat sleep study. Discussed how weight loss could help improve reflux

## 2018-09-12 NOTE — Progress Notes (Signed)
Subjective:    Patient ID: Nancy MarusGeorge D Morin, male    DOB: [birth year], 53 y.o.   MRN: [id number]  HPI  Patient presents today for his annual physical examination.  He is also due for a follow up of his chronic conditions.  He would like to discuss leg pain.  He reports bilateral dull, achy leg pain x 3-4 months.  He takes Zanaflex as needed for back pain and Celebrex and Gabapentin for chronic pain but is not sure if it has help with this leg pain or not. We changed his statin from Rosuvastatin to Atorvastatin due to myalgias, but he does not reports any improvement in symptoms.   HTN:  His BP today is 158/92.  He is taking Amlodipine and Lisinopril/HCTZ as prescribed. He is checking his BP at home intermittently when feeling bad and reports it is high, but could not remember the exact values. ECG from 03/2018 reviewed.    DM2:  His last A1C was 8.0,  05/2018.  He is taking Glipizide as prescribed.  He does not monitor his sugars at home.  He does check his feet, denies seeing any lesion or ulcerations.  He reports numbness and tingling in his feet which is controlled with Gabapentin.  OA:  He no longer  follows with Dr. Durenda Ageeveshewar, because he did not feel like he did anything that really helped him.  He is taking Celebrex but does not feel like it is helpful.  Depression:  Chronic dysthymia.  He is taking Fluoxetine, Wellbutrin, and Lamictal as prescribed.  He denies any SI/HI. He is not currently seeing a therapist or psychiatrist at this time.   GERD:  He denies breakthrough symptoms on Pantoprazole. There is no upper GI on file.   Hypogonadism:  He is taking Testosterone supplements via injections at home.  He reports fatigue but denies sexual dysfunction.  HLD:  His last LDL was 59 on 07/2017.  He is taking Atorvastatin and Fish Oil as prescribed.  He does not consume a low fat diet.  He does complain of myalgias in his legs.  OSA:  He averages 6-7 hours of sleep per night with the use of his  CPAP.  He often does not feel rested when he wakes up.  He rarely naps during the day.  His last sleep study was in 2009.  Flu:11/2017 Tetanus: 07/2016 Pneumovax: 07/2016 PSA:  07/2017 Colon Screening: Never  [facility] Dentist: As needed- Dentures  Diet:  Endorses eating meats, fruits, veggies, fast food, fried foods.  Drinks mostly water, soda, and tea. Exercise:  Not currently getting any routine exercise outside of work.   Review of Systems      Past Medical History:  Diagnosis Date  . Chronic pain 04/09/2011  . DEPRESSION 03/16/2007  . GERD (gastroesophageal reflux disease)   . HYPERTENSION 09/22/2006  . Hypogonadism male 04/09/2011  . Morbid obesity (HCC) 09/22/2006  . Osteoarthritis 04/09/2011  . Sleep apnea    uses a cpap-will use after surgery  . Wears glasses     Current Outpatient Medications  Medication Sig Dispense Refill  . Acetaminophen (TYLENOL ARTHRITIS PAIN PO) Take by mouth as needed.    Marland Kitchen. atorvastatin (LIPITOR) 20 MG tablet Take 1 tablet (20 mg total) by mouth daily. 90 tablet 3  . buPROPion (WELLBUTRIN SR) 150 MG 12 hr tablet   0  . celecoxib (CELEBREX) 100 MG capsule TAKE 1 CAPSULE BY MOUTH TWICE DAILY 60 capsule 5  .  cetirizine (ZYRTEC) 10 MG tablet Take 10 mg by mouth daily.    . Cholecalciferol (VITAMIN D3) 1000 units CAPS Take 1 capsule by mouth daily.    . clindamycin (CLEOCIN T) 1 % lotion   1  . ezetimibe (ZETIA) 10 MG tablet Take 1 tablet (10 mg total) by mouth daily. 90 tablet 3  . fluocinonide cream (LIDEX) 0.05 %   1  . FLUoxetine (PROZAC) 40 MG capsule TAKE 1 CAPSULE BY MOUTH ONCE DAILY 90 capsule 0  . fluticasone (KLS ALLER-FLO) 50 MCG/ACT nasal spray Place 2 sprays into both nostrils as needed.     . gabapentin (NEURONTIN) 300 MG capsule Take 1 capsule (300 mg total) by mouth at bedtime. (Patient taking differently: Take 300 mg by mouth 3 times/day as needed-between meals & bedtime. ) 30 capsule 3  .  glipiZIDE (GLUCOTROL) 10 MG tablet TAKE 1 TABLET BY MOUTH TWICE (2) DAILY BEFORE A MEAL 60 tablet 1  . lamoTRIgine (LAMICTAL) 100 MG tablet TAKE 1 TABLET BY MOUTH EVERY MORNING 30 tablet 5  . lisinopril-hydrochlorothiazide (PRINZIDE,ZESTORETIC) 20-12.5 MG tablet Take 2 tablets by mouth daily. 60 tablet 6  . MEGARED OMEGA-3 KRILL OIL PO Take 750 mg by mouth daily.    . Misc Natural Products (NF FORMULAS TESTOSTERONE) CAPS Take 1 capsule by mouth 2 (two) times daily.    . pantoprazole (PROTONIX) 40 MG tablet TAKE 1 TABLET BY MOUTH ONCE A DAY 30 tablet 2  . potassium chloride (K-DUR) 10 MEQ tablet Take 1 tablet (10 mEq total) by mouth daily. 30 tablet 6  . Syringe/Needle, Disp, (SYRINGE 3CC/20GX1") 20G X 1" 3 ML MISC 1 Dose by Does not apply route every 14 (fourteen) days. 12 each 0  . testosterone cypionate (DEPOTESTOSTERONE CYPIONATE) 200 MG/ML injection Inject 1 mL (200 mg total) into the muscle every 14 (fourteen) days. 2 mL 0  . tiZANidine (ZANAFLEX) 4 MG tablet TAKE 1 TABLET BY MOUTH DAILY AT BEDTIME AS NEEDED FOR MUSCLE SPASMS 30 tablet 0   No current facility-administered medications for this visit.     Allergies  Allergen Reactions  . Penicillins     REACTION: unspecified    Family History  Problem Relation Age of Onset  . Cancer Mother        lymphoma  . Heart disease Father   . Hypertension Father   . Diabetes Sister   . Stroke Paternal Uncle   . Diabetes Maternal Grandmother   . Arthritis Paternal Grandmother   . Heart disease Paternal Grandfather   . Migraines Daughter   . Irritable bowel syndrome Daughter   . Heart Problems Son        valve     Social History   Socioeconomic History  . Marital status: Married    Spouse name: Not on file  . Number of children: Not on file  . Years of education: Not on file  . Highest education level: Not on file  Occupational History  . Not on file  Social Needs  . Financial resource strain: Not on file  . Food insecurity     Worry: Not on file    Inability: Not on file  . Transportation needs    Medical: Not on file    Non-medical: Not on file  Tobacco Use  . Smoking status: Never Smoker  . Smokeless tobacco: Current User    Types: Chew  Substance and Sexual Activity  . Alcohol use: Yes    Comment: rare  . Drug  use: Never  . Sexual activity: Yes  Lifestyle  . Physical activity    Days per week: Not on file    Minutes per session: Not on file  . Stress: Not on file  Relationships  . Social Musicianconnections    Talks on phone: Not on file    Gets together: Not on file    Attends religious service: Not on file    Active member of club or organization: Not on file    Attends meetings of clubs or organizations: Not on file    Relationship status: Not on file  . Intimate partner violence    Fear of current or ex partner: Not on file    Emotionally abused: Not on file    Physically abused: Not on file    Forced sexual activity: Not on file  Other Topics Concern  . Not on file  Social History Narrative  . Not on file     Constitutional: Complains of ongoing fatigue.  Denies fever, malaise, headache or abrupt weight changes.  HEENT: Denies eye pain, eye redness, ear pain, ringing in the ears, wax buildup, runny nose, nasal congestion, bloody nose, or sore throat. Respiratory: Denies difficulty breathing, shortness of breath, cough or sputum production.   Cardiovascular: Complains of swelling in the feet at the end of the day.  Denies chest pain, chest tightness, palpitations.  Gastrointestinal: Denies abdominal pain, bloating, constipation, diarrhea or blood in the stool.  GU: Denies urgency, frequency, pain with urination, burning sensation, blood in urine, odor or discharge. Musculoskeletal: Complains of chronic joint pain and leg pain.  Denies decrease in range of motion, difficulty with gait. Skin: Denies redness or ulcerations.  Neurological: Denies dizziness, difficulty with memory, difficulty with  speech or problems with balance and coordination.  Psych: Pt has a history of depression. Denies anxiety, SI/HI.  No other specific complaints in a complete review of systems (except as listed in HPI above).  Objective:   Physical Exam  BP (!) 158/92 (BP Location: Left Arm, Patient Position: Sitting, Cuff Size: Large)   Pulse 65   Temp 98.4 F (36.9 C)   Ht 6' (1.829 m)   Wt (!) 347 lb (157.4 kg)   SpO2 97%   BMI 47.06 kg/m   Wt Readings from Last 3 Encounters:  03/20/18 (!) 359 lb 8 oz (163.1 kg)  03/14/18 (!) 358 lb (162.4 kg)  02/20/18 (!) 359 lb 4 oz (163 kg)    General: Appears his stated age, obese. Skin: Warm, dry and intact. Cluster of papular, raised lesions to posterior neck- previously biopsied and has seen Dermatology for. HEENT: Head: normal shape and size; Eyes: sclera white, no icterus, conjunctiva pink, PERRLA and EOMs intact; Ears: Tm's gray and intact, normal light reflex;  Neck:  Neck supple, trachea midline. No masses, lumps or thyromegaly present.  Cardiovascular: Normal rate and rhythm. S1,S2 noted.  No murmur, rubs or gallops noted. No JVD or BLE edema. No carotid bruits noted. Pulmonary/Chest: Normal effort and positive vesicular breath sounds. No respiratory distress. No wheezes, rales or ronchi noted.  Abdomen: Taut and nontender. Normal bowel sounds. No distention or masses noted. Liver, spleen and kidneys non palpable. Musculoskeletal: Strength 5/5 BUE/BLE. No difficulty with gait.  Neurological: Alert and oriented. Cranial nerves II-XII grossly intact. Coordination normal.  Psychiatric: Mood and affect normal. Behavior is normal. Judgment and thought content normal.     BMET    Component Value Date/Time   NA 137 08/23/2017 1024  K 4.1 08/23/2017 1024   CL 101 08/23/2017 1024   CO2 28 08/23/2017 1024   GLUCOSE 118 (H) 08/23/2017 1024   BUN 12 08/23/2017 1024   CREATININE 1.03 08/23/2017 1024   CALCIUM 9.2 08/23/2017 1024   GFRNONAA >90  05/15/2013 0922   GFRAA >90 05/15/2013 0922    Lipid Panel     Component Value Date/Time   CHOL 121 08/23/2017 1024   TRIG 201.0 (H) 08/23/2017 1024   HDL 29.00 (L) 08/23/2017 1024   CHOLHDL 4 08/23/2017 1024   VLDL 40.2 (H) 08/23/2017 1024   LDLCALC 74 12/05/2006 0846    CBC    Component Value Date/Time   WBC 4.5 08/23/2017 1024   RBC 4.75 08/23/2017 1024   HGB 12.7 (L) 08/23/2017 1024   HCT 37.8 (L) 08/23/2017 1024   PLT 202.0 08/23/2017 1024   MCV 79.5 08/23/2017 1024   MCHC 33.5 08/23/2017 1024   RDW 14.7 08/23/2017 1024    Hgb A1C Lab Results  Component Value Date   HGBA1C 8.0 (H) 06/15/2018            Assessment & Plan:   Preventative Health Maintenance:  Flu, Tetanus, Pneumovax UTD. Colon Screening- referral to GI placed for colonoscopy. Continue to see eye doctor annually and dentist as needed. Encouraged him to consume a balanced diet and exercise regimen Will check CBC, CMP, Lipid, Testosterone, PSA, A1C drawn today.    Follow up in 1 month for recheck of HTN.  Webb Silversmith, NP

## 2018-09-12 NOTE — Assessment & Plan Note (Addendum)
Elevated today.   Continue Lisinopril/HCTZ.   Add Amlodipine 10 mg daily and recheck in 1 month.   Encouraged him to check blood pressure daily and consume DASH diet.   Encouraged him to increase exercise.

## 2018-09-12 NOTE — Assessment & Plan Note (Addendum)
A1C today No microalbumin secondary to ACEI therapy Continue on Glipizide as prescribed for now. Encouraged consumption of low carb diet and increasing regular exercise. Encouraged routine foot exams Will obtain copy of eye exam Flu and pneumovax UTD.

## 2018-09-12 NOTE — Patient Instructions (Signed)

## 2018-09-12 NOTE — Assessment & Plan Note (Addendum)
Currently managed on Celebrex and Gabapentin.   Still having some joint pain.   Discussed how weight loss could help improve joint pain.

## 2018-09-13 ENCOUNTER — Other Ambulatory Visit: Payer: Self-pay | Admitting: Internal Medicine

## 2018-09-13 MED ORDER — SITAGLIPTIN PHOSPHATE 100 MG PO TABS: 100.0000 mg | ORAL_TABLET | Freq: Every day | ORAL | 2 refills | Status: DC

## 2018-09-13 NOTE — Progress Notes (Signed)
error 

## 2018-09-13 NOTE — Addendum Note (Signed)
Addended by: Jearld Fenton on: 09/13/2018 11:40 AM   Modules accepted: Orders

## 2018-09-14 ENCOUNTER — Telehealth: Payer: Self-pay

## 2018-09-14 LAB — HM DIABETES EYE EXAM

## 2018-09-14 NOTE — Telephone Encounter (Signed)
He should be taking both.

## 2018-09-14 NOTE — Telephone Encounter (Signed)
Tye Maryland DPR signed left v/m that pt was seen on 09/12/18 and pt did not understand about Januvia. Tye [place] wants to know if pt should continue the glipizide and take Januvia or should pt take [place] only.Cathy request cb.

## 2018-09-14 NOTE — Telephone Encounter (Signed)
Called Pt . He is aware of the instructions of taking both medications of Januvia and Glipizide. Pt repeated the " to tak both of them " Call completed.

## 2018-09-18 DIAGNOSIS — G4733 Obstructive sleep apnea (adult) (pediatric): Secondary | ICD-10-CM | POA: Diagnosis not present

## 2018-09-21 ENCOUNTER — Other Ambulatory Visit: Payer: Self-pay | Admitting: Internal Medicine

## 2018-09-25 ENCOUNTER — Encounter: Payer: Self-pay | Admitting: Internal Medicine

## 2018-09-26 ENCOUNTER — Telehealth: Payer: Self-pay | Admitting: Internal Medicine

## 2018-09-26 NOTE — Telephone Encounter (Signed)
Called patient for COVID-19 pre-screening for in office visit. ° °Have you recently traveled any where out of the local area in the last 2 weeks? No ° °Have you been in close contact with a person diagnosed with COVID-19 or someone awaiting results within the last 2 weeks? No ° °Do you currently have any of the following symptoms? If so, when did they start? °Cough     Diarrhea   Joint Pain °Fever      Muscle Pain   Red eyes °Shortness of breath   Abdominal pain  Vomiting °Loss of smell    Rash    Sore Throat °Headache    Weakness   Bruising or bleeding ° ° °Okay to proceed with visit 09/26/2018  ° ° °

## 2018-09-27 ENCOUNTER — Other Ambulatory Visit: Payer: Self-pay

## 2018-09-27 ENCOUNTER — Other Ambulatory Visit: Payer: Self-pay | Admitting: Internal Medicine

## 2018-09-27 ENCOUNTER — Ambulatory Visit: Payer: BC Managed Care – PPO | Admitting: Internal Medicine

## 2018-09-27 ENCOUNTER — Encounter: Payer: Self-pay | Admitting: Internal Medicine

## 2018-09-27 VITALS — BP 156/78 | HR 85 | Temp 97.4°F | Ht 72.0 in | Wt 352.4 lb

## 2018-09-27 DIAGNOSIS — G4719 Other hypersomnia: Secondary | ICD-10-CM | POA: Diagnosis not present

## 2018-09-27 NOTE — Patient Instructions (Addendum)
Will refer you to mask fitting clinic. Please call your primary doctor's office to see if Lamictal can be stopped.  After you are set up with a new mask, please let us know.  We will perform a download a week later to see if your leaking has improved.

## 2018-09-27 NOTE — Progress Notes (Signed)
[facility] Consultation      Assessment and Plan:  Obstructive sleep apnea - Review of download shows that obstructive sleep apnea appears to be well treated with residual AHI of less than 5. - Patient appears to have elevated leak which sometimes wakes him up in the machine.  Explained that the machine is programmed to turn off during elevated leak. - In order to fix a leak I recommended that he undergo a CPAP titration, as the sleep tech can actively watch him while he is sleeping and adjust his mask to a different mask or this setting as needed.  He had a previous sleep study and had a bad experience therefore does not want to go back, therefore I will refer him to mask fitting clinic.   Excessive daytime sleepiness. Depression. - Discussed with patient that his excessive daytime sleepiness may not be due to sleep apnea may be due to sleep fragmentation as well as daytime sleepiness which may be contributed to by medications. -His medication list includes fluoxetine and Lamictal.  I asked that he contact his PCP to see if Lamictal can be changed or discontinued.  In the meantime I asked that he moved his Lamictal to nighttime. - The patient started these medications over 10 years ago when he was going through a particularly tough time with his depression due to life stressors. - Review of download data shows that he was sleeping about 2 hours more per day in 2011.  He is not able to sleep longer than his current sleep time.  Return in about 3 months (around 12/28/2018).    Date: 09/27/2018  MRN# [id number] Anthony Castillo [birth year]  Referring Physician: Dr. Betsy Pries is a 53 y.o. old male seen in consultation for chief complaint of:    Chief Complaint  Patient presents with  . Consult    Referred by Dr. Garnette Gunner, loud snoring ,has CPAP it's losing it's seal and cuts off, Sleep Med, tired during the day, minimal cough, has chest pain and tightness at  times.      HPI:  Anthony Castillo is a 53 y.o. old male with a history obstructive sleep apnea.  Uncertain when original sleep study was, however patient recently got a new CPAP device.  Review PCP notes states that previous sleep study was in 2011.  The machine continually cuts off as he has been trying to use it due to leaks.  Patient usually goes to bed between 10 and 11 PM.  He falls asleep quickly, wakes up to get out of bed around 5 or 6 AM.  His Epworth is elevated at 20.  His medication list includes fluoxetine and Lamictal once daily for depression. Wife notes that he snores loudly and has witnessed apneas, with cpap he still snores with CPAP.  They notes that the machine cuts off when he has a leak.  He was initially on nasal pillows, he has now been on full face mask for the past year.   **CPAP download 06/28/2018-09/25/2018>> raw data personally reviewed.  Average usage on days used 6 hours 52 minutes.  Uses greater than 4 hours is 98%.  Set pressure is 15.  95th percentile leaks are within normal limits, however maximum leaks are elevated.  Residual AHI is 1.7.  Overall this shows very good compliance with CPAP with excellent control of obstructive sleep apnea with occasionally elevated leaks.  Review of old download from January 2011 for 14 days shows  average usage of 8 hours 21 minutes mean pressure of 14.  AHI 2.1. PMHX:   Past Medical History:  Diagnosis Date  . Chronic pain 04/09/2011  . DEPRESSION 03/16/2007  . GERD (gastroesophageal reflux disease)   . HYPERTENSION 09/22/2006  . Hypogonadism male 04/09/2011  . Morbid obesity (HCC) 09/22/2006  . Osteoarthritis 04/09/2011  . Sleep apnea    uses a cpap-will use after surgery  . Wears glasses    Surgical Hx:  Past Surgical History:  Procedure Laterality Date  . CARPAL TUNNEL RELEASE Right 05/17/2013   Procedure: RIGHT CARPAL TUNNEL RELEASE;  Surgeon: Wyn Forsterobert V Sypher Jr., MD;  Location: [facility];  Service:  Orthopedics;  Laterality: Right;  . CARPAL TUNNEL RELEASE Left 07/19/2013   Procedure: LEFT CARPAL TUNNEL RELEASE;  Surgeon: Wyn Forsterobert V Sypher Jr., MD;  Location: [facility];  Service: Orthopedics;  Laterality: Left;  . FOOT SURGERY Right 01/2015  . KNEE ARTHROSCOPY  6/14   right  . TONSILLECTOMY    . ULNAR NERVE TRANSPOSITION Right 05/17/2013   Procedure: RIGHT ULNAR NERVE DECOMPRESSION;  Surgeon: Wyn Forsterobert V Sypher Jr., MD;  Location: [facility];  Service: Orthopedics;  Laterality: Right;  . WISDOM TOOTH EXTRACTION     Family Hx:  Family History  Problem Relation Age of Onset  . Cancer Mother        lymphoma  . Heart disease Father   . Hypertension Father   . Diabetes Sister   . Stroke Paternal Uncle   . Diabetes Maternal Grandmother   . Arthritis Paternal Grandmother   . Heart disease Paternal Grandfather   . Migraines Daughter   . Irritable bowel syndrome Daughter   . Heart Problems Son        valve    Social Hx:   Social History   Tobacco Use  . Smoking status: Never Smoker  . Smokeless tobacco: Current User    Types: Chew  . Tobacco comment: has been using chew 40 years  Substance Use Topics  . Alcohol use: Yes    Comment: rare  . Drug use: Never   Medication:    Current Outpatient Medications:  .  Acetaminophen (TYLENOL ARTHRITIS PAIN PO), Take by mouth as needed., Disp: , Rfl:  .  amLODipine (NORVASC) 10 MG tablet, Take 1 tablet (10 mg total) by mouth daily., Disp: 30 tablet, Rfl: 1 .  atorvastatin (LIPITOR) 20 MG tablet, Take 1 tablet (20 mg total) by mouth daily., Disp: 90 tablet, Rfl: 3 .  buPROPion (WELLBUTRIN SR) 150 MG 12 hr tablet, , Disp: , Rfl: 0 .  celecoxib (CELEBREX) 100 MG capsule, TAKE 1 CAPSULE BY MOUTH TWICE DAILY, Disp: 60 capsule, Rfl: 5 .  cetirizine (ZYRTEC) 10 MG tablet, Take 10 mg by mouth daily., Disp: , Rfl:  .  Cholecalciferol (VITAMIN D3) 1000 units CAPS, Take 1 capsule by mouth daily., Disp: , Rfl:  .   ezetimibe (ZETIA) 10 MG tablet, Take 1 tablet (10 mg total) by mouth daily., Disp: 90 tablet, Rfl: 3 .  fluocinonide cream (LIDEX) 0.05 %, , Disp: , Rfl: 1 .  FLUoxetine (PROZAC) 40 MG capsule, TAKE 1 CAPSULE BY MOUTH ONCE DAILY, Disp: 90 capsule, Rfl: 0 .  fluticasone (KLS ALLER-FLO) 50 MCG/ACT nasal spray, Place 2 sprays into both nostrils as needed. , Disp: , Rfl:  .  gabapentin (NEURONTIN) 300 MG capsule, Take 1 capsule (300 mg total) by mouth at bedtime. (Patient taking differently: Take 300 mg  by mouth 3 times/day as needed-between meals & bedtime. ), Disp: 30 capsule, Rfl: 3 .  glipiZIDE (GLUCOTROL) 10 MG tablet, TAKE 1 TABLET BY MOUTH TWICE (2) DAILY BEFORE A MEAL, Disp: 60 tablet, Rfl: 1 .  lamoTRIgine (LAMICTAL) 100 MG tablet, TAKE 1 TABLET BY MOUTH EVERY MORNING, Disp: 30 tablet, Rfl: 5 .  lisinopril-hydrochlorothiazide (PRINZIDE,ZESTORETIC) 20-12.5 MG tablet, Take 2 tablets by mouth daily., Disp: 60 tablet, Rfl: 6 .  MEGARED OMEGA-3 KRILL OIL PO, Take 750 mg by mouth daily., Disp: , Rfl:  .  Misc Natural Products (NF FORMULAS TESTOSTERONE) CAPS, Take 1 capsule by mouth 2 (two) times daily., Disp: , Rfl:  .  pantoprazole (PROTONIX) 40 MG tablet, TAKE 1 TABLET BY MOUTH ONCE A DAY, Disp: 30 tablet, Rfl: 2 .  potassium chloride (K-DUR) 10 MEQ tablet, Take 1 tablet (10 mEq total) by mouth daily., Disp: 30 tablet, Rfl: 6 .  sitaGLIPtin (JANUVIA) 100 MG tablet, Take 1 tablet (100 mg total) by mouth daily., Disp: 30 tablet, Rfl: 2 .  Syringe/Needle, Disp, (SYRINGE 3CC/20GX1") 20G X 1" 3 ML MISC, 1 Dose by Does not apply route every 14 (fourteen) days., Disp: 12 each, Rfl: 0 .  testosterone cypionate (DEPOTESTOSTERONE CYPIONATE) 200 MG/ML injection, Inject 1 mL (200 mg total) into the muscle every 14 (fourteen) days., Disp: 2 mL, Rfl: 0 .  tiZANidine (ZANAFLEX) 4 MG tablet, TAKE 1 TABLET BY MOUTH DAILY AT BEDTIME AS NEEDED FOR MUSCLE SPASMS., Disp: 30 tablet, Rfl: 0   Allergies:  Penicillins   Review of Systems: Gen:  Denies  fever, sweats, chills HEENT: Denies blurred vision, double vision. bleeds, sore throat Cvc:  No dizziness, chest pain. Resp:   Denies cough or sputum production, shortness of breath Gi: Denies swallowing difficulty, stomach pain. Gu:  Denies bladder incontinence, burning urine Ext:   No Joint pain, stiffness. Skin: No skin rash,  hives  Endoc:  No polyuria, polydipsia. Psych: No depression, insomnia. Other:  All other systems were reviewed with the patient and were negative other that what is mentioned in the HPI.   Physical Examination:   VS: BP (!) 156/78 (BP Location: Left Arm, Cuff Size: Normal)   Pulse 85   Temp (!) 97.4 F (36.3 C) (Temporal)   Ht 6' (1.829 m)   Wt (!) 352 lb 6.4 oz (159.8 kg)   SpO2 98%   BMI 47.79 kg/m   General Appearance: No distress  Neuro:without focal findings,  speech normal,  HEENT: PERRLA, EOM intact.   Pulmonary: normal breath sounds, No wheezing.  CardiovascularNormal S1,S2.  No m/r/g.   Abdomen: Benign, Soft, non-tender. Renal:  No costovertebral tenderness  GU:  No performed at this time. Endoc: No evident thyromegaly, no signs of acromegaly. Skin:   warm, no rashes, no ecchymosis  Extremities: normal, no cyanosis, clubbing.  Other findings:    LABORATORY PANEL:   CBC No results for input(s): WBC, HGB, HCT, PLT in the last 168 hours. ------------------------------------------------------------------------------------------------------------------  Chemistries  No results for input(s): NA, K, CL, CO2, GLUCOSE, BUN, CREATININE, CALCIUM, MG, AST, ALT, ALKPHOS, BILITOT in the last 168 hours.  Invalid input(s): GFRCGP ------------------------------------------------------------------------------------------------------------------  Cardiac Enzymes No results for input(s): TROPONINI in the last 168 hours. ------------------------------------------------------------  RADIOLOGY:  No results found.      Thank  you for the consultation and for allowing [facility] to assist in the care of your patient. Our recommendations are noted above.  Please contact us if we can be  of further service.   Wells Guileseep Alyana Kreiter, M.D., F.C.C.P.  Board Certified in Internal Medicine, Pulmonary Medicine, Critical Care Medicine, and Sleep Medicine.  [facility] Office Number: [phone]   09/27/2018

## 2018-10-05 ENCOUNTER — Other Ambulatory Visit: Payer: Self-pay | Admitting: Internal Medicine

## 2018-10-16 ENCOUNTER — Encounter: Payer: Self-pay | Admitting: Internal Medicine

## 2018-10-16 ENCOUNTER — Other Ambulatory Visit: Payer: Self-pay

## 2018-10-16 ENCOUNTER — Ambulatory Visit (INDEPENDENT_AMBULATORY_CARE_PROVIDER_SITE_OTHER): Payer: BC Managed Care – PPO | Admitting: Internal Medicine

## 2018-10-16 ENCOUNTER — Other Ambulatory Visit: Payer: Self-pay | Admitting: Cardiovascular Disease

## 2018-10-16 ENCOUNTER — Other Ambulatory Visit: Payer: Self-pay | Admitting: Internal Medicine

## 2018-10-16 DIAGNOSIS — I10 Essential (primary) hypertension: Secondary | ICD-10-CM

## 2018-10-16 DIAGNOSIS — M2550 Pain in unspecified joint: Secondary | ICD-10-CM

## 2018-10-16 DIAGNOSIS — M791 Myalgia, unspecified site: Secondary | ICD-10-CM

## 2018-10-16 MED ORDER — AMLODIPINE BESYLATE 10 MG PO TABS: 10.0000 mg | ORAL_TABLET | Freq: Every day | ORAL | 3 refills | Status: DC

## 2018-10-16 NOTE — Assessment & Plan Note (Signed)
Improved but still not at goal Will continue Lisinopril HCT and Amlodipine He is hesitant to add another medication at this time Had a frank discussion about his weight, diet and exercise Will continue to monitor

## 2018-10-16 NOTE — Patient Instructions (Signed)

## 2018-10-16 NOTE — Progress Notes (Signed)
Subjective:    Patient ID: Anthony Castillo, male    DOB: [birth year], 53 y.o.   MRN: [id number]  HPI  Pt presents to the clinic today for follow up of HTN. At his last visit, he was started on Amlodipine in addition to his Lisinopril HCT. He has been taking the medication as prescribed. He denies adverse side effects. His BP today is 148/80.  Review of Systems  Past Medical History:  Diagnosis Date  . Chronic pain 04/09/2011  . DEPRESSION 03/16/2007  . GERD (gastroesophageal reflux disease)   . HYPERTENSION 09/22/2006  . Hypogonadism male 04/09/2011  . Morbid obesity ([place]) 09/22/2006  . Osteoarthritis 04/09/2011  . Sleep apnea    uses a cpap-will use after surgery  . Wears glasses     Current Outpatient Medications  Medication Sig Dispense Refill  . Acetaminophen (TYLENOL ARTHRITIS PAIN PO) Take by mouth as needed.    Marland Kitchen amLODipine (NORVASC) 10 MG tablet Take 1 tablet (10 mg total) by mouth daily. 30 tablet 1  . buPROPion (WELLBUTRIN SR) 150 MG 12 hr tablet   0  . celecoxib (CELEBREX) 100 MG capsule TAKE 1 CAPSULE BY MOUTH TWICE DAILY 60 capsule 5  . cetirizine (ZYRTEC) 10 MG tablet Take 10 mg by mouth daily.    . Cholecalciferol (VITAMIN D3) 1000 units CAPS Take 1 capsule by mouth daily.    Marland Kitchen ezetimibe (ZETIA) 10 MG tablet Take 1 tablet (10 mg total) by mouth daily. 90 tablet 3  . fluocinonide cream (LIDEX) 0.05 %   1  . FLUoxetine (PROZAC) 40 MG capsule TAKE 1 CAPSULE BY MOUTH ONCE DAILY 90 capsule 0  . fluticasone (KLS ALLER-FLO) 50 MCG/ACT nasal spray Place 2 sprays into both nostrils as needed.     . gabapentin (NEURONTIN) 300 MG capsule Take 1 capsule (300 mg total) by mouth at bedtime. 30 capsule 3  . glipiZIDE (GLUCOTROL) 10 MG tablet TAKE 1 TABLET BY MOUTH TWICE DAILY BEFORE A MEAL 60 tablet 1  . lamoTRIgine (LAMICTAL) 100 MG tablet TAKE 1 TABLET BY MOUTH EVERY MORNING 30 tablet 5  . lisinopril-hydrochlorothiazide (PRINZIDE,ZESTORETIC) 20-12.5 MG tablet Take 2 tablets by mouth  daily. 60 tablet 6  . MEGARED OMEGA-3 KRILL OIL PO Take 750 mg by mouth daily.    . Misc Natural Products (NF FORMULAS TESTOSTERONE) CAPS Take 1 capsule by mouth 2 (two) times daily.    . pantoprazole (PROTONIX) 40 MG tablet TAKE 1 TABLET BY MOUTH ONCE A DAY 30 tablet 5  . potassium chloride (K-DUR) 10 MEQ tablet Take 1 tablet (10 mEq total) by mouth daily. 30 tablet 6  . sitaGLIPtin (JANUVIA) 100 MG tablet Take 1 tablet (100 mg total) by mouth daily. 30 tablet 2  . Syringe/Needle, Disp, (SYRINGE 3CC/20GX1") 20G X 1" 3 ML MISC 1 Dose by Does not apply route every 14 (fourteen) days. 12 each 0  . testosterone cypionate (DEPOTESTOSTERONE CYPIONATE) 200 MG/ML injection Inject 1 mL (200 mg total) into the muscle every 14 (fourteen) days. 2 mL 0  . tiZANidine (ZANAFLEX) 4 MG tablet TAKE 1 TABLET BY MOUTH DAILY AT BEDTIME AS NEEDED FOR MUSCLE SPASMS. 30 tablet 0  . atorvastatin (LIPITOR) 20 MG tablet Take 1 tablet (20 mg total) by mouth daily. 90 tablet 3   No current facility-administered medications for this visit.     Allergies  Allergen Reactions  . Penicillins     REACTION: unspecified    Family History  Problem Relation Age of Onset  .  Cancer Mother        lymphoma  . Heart disease Father   . Hypertension Father   . Diabetes Sister   . Stroke Paternal Uncle   . Diabetes Maternal Grandmother   . Arthritis Paternal Grandmother   . Heart disease Paternal Grandfather   . Migraines Daughter   . Irritable bowel syndrome Daughter   . Heart Problems Son        valve     Social History   Socioeconomic History  . Marital status: Married    Spouse name: Not on file  . Number of children: Not on file  . Years of education: Not on file  . Highest education level: Not on file  Occupational History  . Not on file  Social Needs  . Financial resource strain: Not on file  . Food insecurity    Worry: Not on file    Inability: Not on file  . Transportation needs    Medical: Not on  file    Non-medical: Not on file  Tobacco Use  . Smoking status: Never Smoker  . Smokeless tobacco: Current User    Types: Chew  . Tobacco comment: has been using chew 40 years  Substance and Sexual Activity  . Alcohol use: Yes    Comment: rare  . Drug use: Never  . Sexual activity: Yes  Lifestyle  . Physical activity    Days per week: Not on file    Minutes per session: Not on file  . Stress: Not on file  Relationships  . Social Musicianconnections    Talks on phone: Not on file    Gets together: Not on file    Attends religious service: Not on file    Active member of club or organization: Not on file    Attends meetings of clubs or organizations: Not on file    Relationship status: Not on file  . Intimate partner violence    Fear of current or ex partner: Not on file    Emotionally abused: Not on file    Physically abused: Not on file    Forced sexual activity: Not on file  Other Topics Concern  . Not on file  Social History Narrative  . Not on file     Constitutional: Pt reports chronic fatigue. Denies fever, malaise, headache or abrupt weight changes.  Respiratory: Denies difficulty breathing, shortness of breath, cough or sputum production.   Cardiovascular: Pt reports mild swelling in legs, not worse with addition of Amlodipine. Denies chest pain, chest tightness, palpitations or swelling in the hands.  Neurological: Denies dizziness, difficulty with memory, difficulty with speech or problems with balance and coordination.    No other specific complaints in a complete review of systems (except as listed in HPI above).     Objective:   Physical Exam   BP (!) 148/80   Pulse 66   Temp 98.6 F (37 C) (Temporal)   Wt (!) 352 lb (159.7 kg)   SpO2 99%   BMI 47.74 kg/m  Wt Readings from Last 3 Encounters:  10/16/18 (!) 352 lb (159.7 kg)  09/27/18 (!) 352 lb 6.4 oz (159.8 kg)  09/12/18 (!) 347 lb (157.4 kg)    General: Appears his stated age, obese, in NAD.  Cardiovascular: Normal rate and rhythm. S1,S2 noted.  No murmur, rubs or gallops noted. Trace BLE edema. Pulmonary/Chest: Normal effort and positive vesicular breath sounds. No respiratory distress. No wheezes, rales or ronchi noted.  Neurological: Alert  and oriented.    BMET    Component Value Date/Time   NA 136 09/12/2018 0911   K 4.1 09/12/2018 0911   CL 100 09/12/2018 0911   CO2 27 09/12/2018 0911   GLUCOSE 161 (H) 09/12/2018 0911   BUN 14 09/12/2018 0911   CREATININE 1.04 09/12/2018 0911   CALCIUM 8.9 09/12/2018 0911   GFRNONAA >90 05/15/2013 0922   GFRAA >90 05/15/2013 0922    Lipid Panel     Component Value Date/Time   CHOL 102 09/12/2018 0911   TRIG 172.0 (H) 09/12/2018 0911   HDL 29.20 (L) 09/12/2018 0911   CHOLHDL 3 09/12/2018 0911   VLDL 34.4 09/12/2018 0911   LDLCALC 38 09/12/2018 0911    CBC    Component Value Date/Time   WBC 5.3 09/12/2018 0911   RBC 5.04 09/12/2018 0911   HGB 13.2 09/12/2018 0911   HCT 40.3 09/12/2018 0911   PLT 206.0 09/12/2018 0911   MCV 80.0 09/12/2018 0911   MCHC 32.8 09/12/2018 0911   RDW 15.3 09/12/2018 0911    Hgb A1C Lab Results  Component Value Date   HGBA1C 8.7 (H) 09/12/2018           Assessment & Plan:

## 2018-10-18 ENCOUNTER — Other Ambulatory Visit: Payer: Self-pay | Admitting: Internal Medicine

## 2018-10-19 ENCOUNTER — Other Ambulatory Visit: Payer: Self-pay | Admitting: Cardiovascular Disease

## 2018-10-30 DIAGNOSIS — H5203 Hypermetropia, bilateral: Secondary | ICD-10-CM | POA: Diagnosis not present

## 2018-10-30 DIAGNOSIS — E119 Type 2 diabetes mellitus without complications: Secondary | ICD-10-CM | POA: Diagnosis not present

## 2018-10-30 DIAGNOSIS — H52213 Irregular astigmatism, bilateral: Secondary | ICD-10-CM | POA: Diagnosis not present

## 2018-10-30 DIAGNOSIS — H524 Presbyopia: Secondary | ICD-10-CM | POA: Diagnosis not present

## 2018-11-24 DIAGNOSIS — M1711 Unilateral primary osteoarthritis, right knee: Secondary | ICD-10-CM | POA: Diagnosis not present

## 2018-12-12 ENCOUNTER — Other Ambulatory Visit: Payer: Self-pay | Admitting: Internal Medicine

## 2018-12-13 ENCOUNTER — Other Ambulatory Visit: Payer: Self-pay | Admitting: Internal Medicine

## 2018-12-15 ENCOUNTER — Other Ambulatory Visit: Payer: Self-pay | Admitting: Cardiovascular Disease

## 2018-12-25 DIAGNOSIS — M1711 Unilateral primary osteoarthritis, right knee: Secondary | ICD-10-CM | POA: Diagnosis not present

## 2019-01-05 ENCOUNTER — Encounter: Payer: Self-pay | Admitting: Internal Medicine

## 2019-01-08 ENCOUNTER — Other Ambulatory Visit: Payer: Self-pay | Admitting: Cardiovascular Disease

## 2019-01-08 ENCOUNTER — Other Ambulatory Visit: Payer: Self-pay | Admitting: Internal Medicine

## 2019-01-26 ENCOUNTER — Other Ambulatory Visit: Payer: Self-pay | Admitting: Family Medicine

## 2019-01-26 ENCOUNTER — Other Ambulatory Visit: Payer: Self-pay | Admitting: Internal Medicine

## 2019-01-29 ENCOUNTER — Other Ambulatory Visit: Payer: Self-pay | Admitting: Internal Medicine

## 2019-01-29 NOTE — Telephone Encounter (Signed)
Last office visit 10/16/2018 for HTN.  Last refilled 10/12/2017 for #30 with 3 refills by Dr. Lorelei Pont.  No future appointments.  Refill?

## 2019-01-30 NOTE — Telephone Encounter (Signed)
Last filled 06/23/2018... please advise, last Testosterone lab 09/12/2018

## 2019-02-05 ENCOUNTER — Other Ambulatory Visit: Payer: Self-pay | Admitting: Internal Medicine

## 2019-02-26 ENCOUNTER — Other Ambulatory Visit: Payer: Self-pay | Admitting: Cardiovascular Disease

## 2019-03-15 ENCOUNTER — Other Ambulatory Visit: Payer: Self-pay

## 2019-03-15 ENCOUNTER — Ambulatory Visit: Payer: 59 | Attending: Internal Medicine

## 2019-03-15 DIAGNOSIS — Z20822 Contact with and (suspected) exposure to covid-19: Secondary | ICD-10-CM | POA: Insufficient documentation

## 2019-03-16 LAB — NOVEL CORONAVIRUS, NAA: SARS-CoV-2, NAA: NOT DETECTED

## 2019-03-19 ENCOUNTER — Other Ambulatory Visit: Payer: Self-pay | Admitting: Internal Medicine

## 2019-04-04 ENCOUNTER — Other Ambulatory Visit: Payer: Self-pay | Admitting: Cardiovascular Disease

## 2019-04-04 ENCOUNTER — Other Ambulatory Visit: Payer: Self-pay | Admitting: Internal Medicine

## 2019-04-04 NOTE — Telephone Encounter (Signed)
Scheduled

## 2019-04-04 NOTE — Progress Notes (Signed)
Office Visit    Patient Name: Anthony Castillo Date of Encounter: 04/09/2019  Primary Care Provider:  Lorre Munroe, NP Primary Cardiologist:  Julien Nordmann, MD Electrophysiologist:  None   Chief Complaint    Anthony Castillo is a 54 y.o. male with a hx of HTN, obesity, HLD, OSA, osteoarthritis, chest pain presents today for follow up of HTN.   Past Medical History    Past Medical History:  Diagnosis Date  . Chronic pain 04/09/2011  . DEPRESSION 03/16/2007  . GERD (gastroesophageal reflux disease)   . HYPERTENSION 09/22/2006  . Hypogonadism male 04/09/2011  . Morbid obesity ([facility]) 09/22/2006  . Osteoarthritis 04/09/2011  . Sleep apnea    uses a cpap-will use after surgery  . Wears glasses    Past Surgical History:  Procedure Laterality Date  . CARPAL TUNNEL RELEASE Right 05/17/2013   Procedure: RIGHT CARPAL TUNNEL RELEASE;  Surgeon: Wyn Forster., MD;  Location: [facility];  Service: Orthopedics;  Laterality: Right;  . CARPAL TUNNEL RELEASE Left 07/19/2013   Procedure: LEFT CARPAL TUNNEL RELEASE;  Surgeon: Wyn Forster., MD;  Location: [facility];  Service: Orthopedics;  Laterality: Left;  . FOOT SURGERY Right 01/2015  . KNEE ARTHROSCOPY  6/14   right  . TONSILLECTOMY    . ULNAR NERVE TRANSPOSITION Right 05/17/2013   Procedure: RIGHT ULNAR NERVE DECOMPRESSION;  Surgeon: Wyn Forster., MD;  Location:  SURGERY CENTER;  Service: Orthopedics;  Laterality: Right;  . WISDOM TOOTH EXTRACTION      Allergies  Allergies  Allergen Reactions  . Penicillins     REACTION: unspecified    History of Present Illness    Anthony Castillo is a 54 y.o. male with a hx of HTN, obesity, HLD, OSA, osteoarthritis, chest pain. He was last seen 03/20/18 by Dr. Mariah Castillo.  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 Denies chest pain, pressure, tightness. Reports no shortness of breath nor dyspnea on exertion. No edema,  orthopnea, PND.   Reports compliance with all of his medications.   No formal exercise regimen, but works outside daily. Endorses eating a heart healthy diet.   EKGs/Labs/Other Studies Reviewed:   The following studies were reviewed today: CT cardiac 04/11/18 FINDINGS: Within the visualized portions of the thorax there are no suspicious appearing pulmonary nodules or masses, there is no acute consolidative airspace disease, no pleural effusions, no pneumothorax and no lymphadenopathy. Visualized portions of the upper abdomen demonstrates diffuse low attenuation throughout the visualized hepatic parenchyma, indicative of hepatic steatosis. There are no aggressive appearing lytic or blastic lesions noted in the visualized portions of the skeleton.   IMPRESSION: 1. Hepatic steatosis. FINDINGS: Non-cardiac: See separate report from [facility].   Ascending Aorta: Normal size.   Pericardium: Normal.   Coronary arteries: Normal origin.   IMPRESSION: 1. Coronary calcium score of 103. This was 70 percentile for age and sex matched control.   2. Mildly dilated pulmonary artery measuring 32 mm suggestive of pulmonary hypertension.  EKG:  EKG is ordered today.  The ekg ordered today demonstrates SR 73 bpm no acute ST/T wave changes. He has stable TWI in leads V4-V6.  Recent Labs: 09/12/2018: ALT 24; BUN 14; Creatinine, Ser 1.04; Hemoglobin 13.2; Platelets 206.0; Potassium 4.1; Sodium 136  Recent Lipid Panel    Component Value Date/Time   CHOL 102 09/12/2018 0911   TRIG 172.0 (H) 09/12/2018 0911  HDL 29.20 (L) 09/12/2018 0911   CHOLHDL 3 09/12/2018 0911   VLDL 34.4 09/12/2018 0911   LDLCALC 38 09/12/2018 0911   LDLDIRECT 59.0 08/23/2017 1024    Home Medications   Current Meds  Medication Sig  . Acetaminophen (TYLENOL ARTHRITIS PAIN PO) Take by mouth as needed.  Marland Kitchen amLODipine (NORVASC) 10 MG tablet Take 1 tablet (10 mg total) by mouth daily.  Marland Kitchen atorvastatin  (LIPITOR) 20 MG tablet Take 1 tablet (20 mg total) by mouth daily.  Marland Kitchen buPROPion (WELLBUTRIN SR) 150 MG 12 hr tablet   . celecoxib (CELEBREX) 100 MG capsule TAKE 1 CAPSULE BY MOUTH TWICE DAILY  . cetirizine (ZYRTEC) 10 MG tablet Take 10 mg by mouth daily.  . Cholecalciferol (VITAMIN D3) 1000 units CAPS Take 1 capsule by mouth daily.  Marland Kitchen ezetimibe (ZETIA) 10 MG tablet Take 1 tablet (10 mg total) by mouth daily.  . fluocinonide cream (LIDEX) 0.05 % as needed.   Marland Kitchen FLUoxetine (PROZAC) 40 MG capsule TAKE 1 CAPSULE BY MOUTH ONCE DAILY  . fluticasone (KLS ALLER-FLO) 50 MCG/ACT nasal spray Place 2 sprays into both nostrils as needed.   . gabapentin (NEURONTIN) 300 MG capsule TAKE 1 CAPSULE BY MOUTH EVERY NIGHT AT BEDTIME  . glipiZIDE (GLUCOTROL) 10 MG tablet TAKE 1 TABLET BY MOUTH TWICE A DAY BEFORE A MEAL.  Marland Kitchen JANUVIA 100 MG tablet TAKE ONE TABLET BY MOUTH ONCE DAILY  . lamoTRIgine (LAMICTAL) 100 MG tablet TAKE 1 TABLET BY MOUTH ONCE DAILY EVERY MORNING  . lisinopril-hydrochlorothiazide (ZESTORETIC) 20-12.5 MG tablet Take 2 tablets by mouth daily.  Marland Kitchen MEGARED OMEGA-3 KRILL OIL PO Take 750 mg by mouth daily.  . Misc Natural Products (NF FORMULAS TESTOSTERONE) CAPS Take 1 capsule by mouth 2 (two) times daily.  . pantoprazole (PROTONIX) 40 MG tablet TAKE 1 TABLET BY MOUTH ONCE A DAY  . potassium chloride (KLOR-CON) 10 MEQ tablet Take 1 tablet (10 mEq total) by mouth daily.  . Syringe/Needle, Disp, (SYRINGE 3CC/20GX1") 20G X 1" 3 ML MISC 1 Dose by Does not apply route every 14 (fourteen) days.  Marland Kitchen testosterone cypionate (DEPOTESTOSTERONE CYPIONATE) 200 MG/ML injection INJECT 1 ML (200 MG) INTO THE MUSCLE EVERY 14 DAYS  . tiZANidine (ZANAFLEX) 4 MG tablet TAKE 1 TABLET BY MOUTH DAILY AT BEDTIME AS NEEDED FOR MUSCLE SPASMS.  . [DISCONTINUED] atorvastatin (LIPITOR) 20 MG tablet TAKE 1 TABLET BY MOUTH ONCE DAILY  . [DISCONTINUED] ezetimibe (ZETIA) 10 MG tablet TAKE 1 TABLET BY MOUTH ONCE A DAY  . [DISCONTINUED]  lisinopril-hydrochlorothiazide (ZESTORETIC) 20-12.5 MG tablet TAKE 2 TABLETS BY MOUTH ONCE A DAY  . [DISCONTINUED] potassium chloride (KLOR-CON) 10 MEQ tablet TAKE 1 TABLET BY MOUTH ONCE DAILY      Review of Systems      Review of Systems  Constitution: Negative for chills, fever and malaise/fatigue.  Cardiovascular: Negative for chest pain, dyspnea on exertion, leg swelling, near-syncope, orthopnea, palpitations and syncope.  Respiratory: Negative for cough, shortness of breath and wheezing.   Gastrointestinal: Negative for nausea and vomiting.  Neurological: Negative for dizziness, light-headedness and weakness.   All other systems reviewed and are otherwise negative except as noted above.  Physical Exam    VS:  BP 130/78 (BP Location: Left Arm, Patient Position: Sitting, Cuff Size: Large)   Pulse 73   Ht 6' (1.829 m)   Wt (!) 349 lb 8 oz (158.5 kg)   SpO2 98%   BMI 47.40 kg/m  , BMI Body mass index is 47.4 kg/m.  GEN: Well nourished, overweight, well developed, in no acute distress. HEENT: normal. Neck: Supple, no JVD, carotid bruits, or masses. Cardiac: RRR, no murmurs, rubs, or gallops. No clubbing, cyanosis, edema.  Radials/DP/PT 2+ and equal bilaterally.  Respiratory:  Respirations regular and unlabored, clear to auscultation bilaterally. GI: Soft, nontender, nondistended. MS: No deformity or atrophy. Skin: Warm and dry, no rash. Neuro:  Strength and sensation are intact. Psych: Normal affect.  Assessment & Plan    1. HTN - BP well controlled. Continue present anti-hypertensive regimen as prescribed by PCP. BP goal <130/80.   2. HLD - Labs 08/2018 total cholesterol 102, HDL 29, LDL 38, triglycerides 172.  Continue present regimen of Atorvastatin and Zetia, refills were provided.   3. Obesity - Weight loss encouraged. Recommend low carbohydrate diet. Recommend regular cardiovascular exercise.   4. Lower extremity edema - Reports no recurrence. Encouraged low sodium  diet.   Disposition: Follow up in 1 year(s) with Dr. Rockey Situ or APP.    Loel Dubonnet, NP 04/09/2019, 12:59 PM

## 2019-04-04 NOTE — Telephone Encounter (Signed)
Please call to schedule appointment for refills. Thank you! 

## 2019-04-09 ENCOUNTER — Other Ambulatory Visit: Payer: Self-pay

## 2019-04-09 ENCOUNTER — Encounter: Payer: Self-pay | Admitting: Family

## 2019-04-09 ENCOUNTER — Ambulatory Visit (INDEPENDENT_AMBULATORY_CARE_PROVIDER_SITE_OTHER): Payer: 59 | Admitting: Family

## 2019-04-09 VITALS — BP 130/78 | HR 73 | Ht 72.0 in | Wt 349.5 lb

## 2019-04-09 DIAGNOSIS — I10 Essential (primary) hypertension: Secondary | ICD-10-CM | POA: Diagnosis not present

## 2019-04-09 DIAGNOSIS — Z8249 Family history of ischemic heart disease and other diseases of the circulatory system: Secondary | ICD-10-CM

## 2019-04-09 DIAGNOSIS — R600 Localized edema: Secondary | ICD-10-CM | POA: Diagnosis not present

## 2019-04-09 DIAGNOSIS — E782 Mixed hyperlipidemia: Secondary | ICD-10-CM

## 2019-04-09 MED ORDER — LISINOPRIL-HYDROCHLOROTHIAZIDE 20-12.5 MG PO TABS: 2.0000 | ORAL_TABLET | Freq: Every day | ORAL | 3 refills | Status: DC

## 2019-04-09 MED ORDER — EZETIMIBE 10 MG PO TABS: 10.0000 mg | ORAL_TABLET | Freq: Every day | ORAL | 3 refills | Status: DC

## 2019-04-09 MED ORDER — POTASSIUM CHLORIDE ER 10 MEQ PO TBCR: 10.0000 meq | EXTENDED_RELEASE_TABLET | Freq: Every day | ORAL | 3 refills | Status: DC

## 2019-04-09 MED ORDER — ATORVASTATIN CALCIUM 20 MG PO TABS: 20.0000 mg | ORAL_TABLET | Freq: Every day | ORAL | 3 refills | Status: DC

## 2019-04-09 NOTE — Patient Instructions (Addendum)
Medication Instructions:  No medication changes today.  We sent refills to your pharmacy  *If you need a refill on your cardiac medications before your next appointment, please call your pharmacy*  Lab Work: None today. Your labs from July including your cholesterol looked good!  Testing/Procedures: You had an EKG today. It showed normal sinus rhythm which is a great result!  Follow-Up: At [facility], you and your health needs are our priority.  As part of our continuing mission to provide you with exceptional heart care, we have created designated Provider Care Teams.  These Care Teams include your primary Cardiologist (physician) and Advanced Practice Providers (APPs -  Physician Assistants and Nurse Practitioners) who all work together to provide you with the care you need, when you need it.  Your next appointment:   1 year(s)  The format for your next appointment:   In Person  Provider:   Julien Nordmann, MD  Other Instructions  It was a pleasure to meet you today!  Good work on the weight loss.  We recommend a low salt, heart healthy diet. Additional information included below.  Keep up the good work staying active.  DASH Eating Plan DASH stands for "Dietary Approaches to Stop Hypertension." The DASH eating plan is a healthy eating plan that has been shown to reduce high blood pressure (hypertension). It may also reduce your risk for type 2 diabetes, heart disease, and stroke. The DASH eating plan may also help with weight loss. What are tips for following this plan?  General guidelines  Avoid eating more than 2,300 mg (milligrams) of salt (sodium) a day. If you have hypertension, you may need to reduce your sodium intake to 1,500 mg a day.  Limit alcohol intake to no more than 1 drink a day for nonpregnant women and 2 drinks a day for men. One drink equals 12 oz of beer, 5 oz of wine, or 1 oz of hard liquor.  Work with your health care provider to maintain a  healthy body weight or to lose weight. Ask what an ideal weight is for you.  Get at least 30 minutes of exercise that causes your heart to beat faster (aerobic exercise) most days of the week. Activities may include walking, swimming, or biking.  Work with your health care provider or diet and nutrition specialist (dietitian) to adjust your eating plan to your individual calorie needs. Reading food labels   Check food labels for the amount of sodium per serving. Choose foods with less than 5 percent of the Daily Value of sodium. Generally, foods with less than 300 mg of sodium per serving fit into this eating plan.  To find whole grains, look for the word "whole" as the first word in the ingredient list. Shopping  Buy products labeled as "low-sodium" or "no salt added."  Buy fresh foods. Avoid canned foods and premade or frozen meals. Cooking  Avoid adding salt when cooking. Use salt-free seasonings or herbs instead of table salt or sea salt. Check with your health care provider or pharmacist before using salt substitutes.  Do not fry foods. Cook foods using healthy methods such as baking, boiling, grilling, and broiling instead.  Cook with heart-healthy oils, such as olive, canola, soybean, or sunflower oil. Meal planning  Eat a balanced diet that includes: ? 5 or more servings of fruits and vegetables each day. At each meal, try to fill half of your plate with fruits and vegetables. ? Up to 6-8 servings of whole  grains each day. ? Less than 6 oz of lean meat, poultry, or fish each day. A 3-oz serving of meat is about the same size as a deck of cards. One egg equals 1 oz. ? 2 servings of low-fat dairy each day. ? A serving of nuts, seeds, or beans 5 times each week. ? Heart-healthy fats. Healthy fats called Omega-3 fatty acids are found in foods such as flaxseeds and coldwater fish, like sardines, salmon, and mackerel.  Limit how much you eat of the following: ? Canned or  prepackaged foods. ? Food that is high in trans fat, such as fried foods. ? Food that is high in saturated fat, such as fatty meat. ? Sweets, desserts, sugary drinks, and other foods with added sugar. ? Full-fat dairy products.  Do not salt foods before eating.  Try to eat at least 2 vegetarian meals each week.  Eat more home-cooked food and less restaurant, buffet, and fast food.  When eating at a restaurant, ask that your food be prepared with less salt or no salt, if possible. What foods are recommended? The items listed may not be a complete list. Talk with your dietitian about what dietary choices are best for you. Grains Whole-grain or whole-wheat bread. Whole-grain or whole-wheat pasta. Brown rice. [place]ena Morrow. Bulgur. Whole-grain and low-sodium cereals. Pita bread. Low-fat, low-sodium crackers. Whole-wheat flour tortillas. Vegetables Fresh or frozen vegetables (raw, steamed, roasted, or grilled). Low-sodium or reduced-sodium tomato and vegetable juice. Low-sodium or reduced-sodium tomato sauce and tomato paste. Low-sodium or reduced-sodium canned vegetables. Fruits All fresh, dried, or frozen fruit. Canned fruit in natural juice (without added sugar). Meat and other protein foods Skinless chicken or [place]. Ground chicken or [place]. Pork with fat trimmed off. Fish and seafood. Egg whites. Dried beans, peas, or lentils. Unsalted nuts, nut butters, and seeds. Unsalted canned beans. Lean cuts of beef with fat trimmed off. Low-sodium, lean deli meat. Dairy Low-fat (1%) or fat-free (skim) milk. Fat-free, low-fat, or reduced-fat cheeses. Nonfat, low-sodium ricotta or cottage cheese. Low-fat or nonfat yogurt. Low-fat, low-sodium cheese. Fats and oils Soft margarine without trans fats. Vegetable oil. Low-fat, reduced-fat, or light mayonnaise and salad dressings (reduced-sodium). Canola, safflower, olive, soybean, and sunflower oils. Avocado. Seasoning and other foods Herbs. Spices.  Seasoning mixes without salt. Unsalted popcorn and pretzels. Fat-free sweets. What foods are not recommended? The items listed may not be a complete list. Talk with your dietitian about what dietary choices are best for you. Grains Baked goods made with fat, such as croissants, muffins, or some breads. Dry pasta or rice meal packs. Vegetables Creamed or fried vegetables. Vegetables in a cheese sauce. Regular canned vegetables (not low-sodium or reduced-sodium). Regular canned tomato sauce and paste (not low-sodium or reduced-sodium). Regular tomato and vegetable juice (not low-sodium or reduced-sodium). Angie Fava. Olives. Fruits Canned fruit in a light or heavy syrup. Fried fruit. Fruit in cream or butter sauce. Meat and other protein foods Fatty cuts of meat. Ribs. Fried meat. Berniece Salines. Sausage. Bologna and other processed lunch meats. Salami. Fatback. Hotdogs. Bratwurst. Salted nuts and seeds. Canned beans with added salt. Canned or smoked fish. Whole eggs or egg yolks. Chicken or [place] with skin. Dairy Whole or 2% milk, cream, and half-and-half. Whole or full-fat cream cheese. Whole-fat or sweetened yogurt. Full-fat cheese. Nondairy creamers. Whipped toppings. Processed cheese and cheese spreads. Fats and oils Butter. Stick margarine. Lard. Shortening. Ghee. Bacon fat. Tropical oils, such as coconut, palm kernel, or palm oil. Seasoning and other foods Salted popcorn  and pretzels. Onion salt, garlic salt, seasoned salt, table salt, and sea salt. Worcestershire sauce. Tartar sauce. Barbecue sauce. Teriyaki sauce. Soy sauce, including reduced-sodium. Steak sauce. Canned and packaged gravies. Fish sauce. Oyster sauce. Cocktail sauce. Horseradish that you find on the shelf. Ketchup. Mustard. Meat flavorings and tenderizers. Bouillon cubes. Hot sauce and Tabasco sauce. Premade or packaged marinades. Premade or packaged taco seasonings. Relishes. Regular salad dressings. Where to find more  information:  National Heart, Lung, and Blood Institute: PopSteam.is  American Heart Association: www.heart.org Summary  The DASH eating plan is a healthy eating plan that has been shown to reduce high blood pressure (hypertension). It may also reduce your risk for type 2 diabetes, heart disease, and stroke.  With the DASH eating plan, you should limit salt (sodium) intake to 2,300 mg a day. If you have hypertension, you may need to reduce your sodium intake to 1,500 mg a day.  When on the DASH eating plan, aim to eat more fresh fruits and vegetables, whole grains, lean proteins, low-fat dairy, and heart-healthy fats.  Work with your health care provider or diet and nutrition specialist (dietitian) to adjust your eating plan to your individual calorie needs. This information is not intended to replace advice given to you by your health care provider. Make sure you discuss any questions you have with your health care provider. Document Revised: 01/28/2017 Document Reviewed: 02/09/2016 Elsevier Patient Education  2020 [employer].

## 2019-04-10 ENCOUNTER — Other Ambulatory Visit: Payer: Self-pay | Admitting: Internal Medicine

## 2019-04-16 ENCOUNTER — Other Ambulatory Visit: Payer: Self-pay | Admitting: Internal Medicine

## 2019-04-23 ENCOUNTER — Other Ambulatory Visit: Payer: Self-pay | Admitting: Internal Medicine

## 2019-04-23 DIAGNOSIS — M2550 Pain in unspecified joint: Secondary | ICD-10-CM

## 2019-04-23 DIAGNOSIS — M791 Myalgia, unspecified site: Secondary | ICD-10-CM

## 2019-05-12 ENCOUNTER — Ambulatory Visit: Payer: 59

## 2019-05-14 ENCOUNTER — Ambulatory Visit: Payer: 59

## 2019-05-17 ENCOUNTER — Ambulatory Visit: Payer: 59 | Attending: Internal Medicine

## 2019-05-21 ENCOUNTER — Other Ambulatory Visit: Payer: Self-pay | Admitting: Internal Medicine

## 2019-05-26 ENCOUNTER — Ambulatory Visit: Payer: 59

## 2019-06-04 ENCOUNTER — Other Ambulatory Visit: Payer: Self-pay | Admitting: Internal Medicine

## 2019-06-13 ENCOUNTER — Other Ambulatory Visit: Payer: Self-pay | Admitting: Internal Medicine

## 2019-06-19 ENCOUNTER — Other Ambulatory Visit: Payer: Self-pay | Admitting: Internal Medicine

## 2019-06-27 ENCOUNTER — Other Ambulatory Visit: Payer: Self-pay | Admitting: Internal Medicine

## 2019-06-27 DIAGNOSIS — M791 Myalgia, unspecified site: Secondary | ICD-10-CM

## 2019-06-27 DIAGNOSIS — M2550 Pain in unspecified joint: Secondary | ICD-10-CM

## 2019-07-10 ENCOUNTER — Other Ambulatory Visit: Payer: Self-pay | Admitting: Internal Medicine

## 2019-07-10 ENCOUNTER — Other Ambulatory Visit: Payer: Self-pay | Admitting: Cardiovascular Disease

## 2019-07-10 DIAGNOSIS — I10 Essential (primary) hypertension: Secondary | ICD-10-CM

## 2019-07-24 ENCOUNTER — Other Ambulatory Visit: Payer: Self-pay | Admitting: Internal Medicine

## 2019-07-28 ENCOUNTER — Other Ambulatory Visit: Payer: Self-pay | Admitting: Internal Medicine

## 2019-07-28 DIAGNOSIS — M791 Myalgia, unspecified site: Secondary | ICD-10-CM

## 2019-07-28 DIAGNOSIS — M2550 Pain in unspecified joint: Secondary | ICD-10-CM

## 2019-08-01 ENCOUNTER — Ambulatory Visit (INDEPENDENT_AMBULATORY_CARE_PROVIDER_SITE_OTHER): Payer: 59 | Admitting: Internal Medicine

## 2019-08-01 ENCOUNTER — Encounter: Payer: Self-pay | Admitting: Internal Medicine

## 2019-08-01 ENCOUNTER — Other Ambulatory Visit: Payer: Self-pay

## 2019-08-01 VITALS — BP 134/70 | HR 87 | Temp 98.4°F | Wt 352.0 lb

## 2019-08-01 DIAGNOSIS — M545 Low back pain, unspecified: Secondary | ICD-10-CM

## 2019-08-01 MED ORDER — TRAMADOL HCL 50 MG PO TABS: 50.0000 mg | ORAL_TABLET | Freq: Three times a day (TID) | ORAL | 0 refills | Status: AC | PRN

## 2019-08-01 MED ORDER — TIZANIDINE HCL 6 MG PO CAPS
6.0000 mg | ORAL_CAPSULE | Freq: Three times a day (TID) | ORAL | 0 refills | Status: DC | PRN
Start: 2019-08-01 — End: 2022-11-11

## 2019-08-01 MED ORDER — KETOROLAC TROMETHAMINE 60 MG/2ML IM SOLN
60.0000 mg | Freq: Once | INTRAMUSCULAR | Status: AC
  Administered 2019-08-01: 60 mg via INTRAMUSCULAR

## 2019-08-01 NOTE — Addendum Note (Signed)
Addended by: Roena Malady on: 08/01/2019 03:36 PM   Modules accepted: Orders

## 2019-08-01 NOTE — Patient Instructions (Signed)
Muscle Strain A muscle strain is an injury that happens when a muscle is stretched longer than normal. This can happen during a fall, sports, or lifting. This can tear some muscle fibers. Usually, recovery from muscle strain takes 1-2 weeks. Complete healing normally takes 5-6 weeks. This condition is first treated with PRICE therapy. This involves:  Protecting your muscle from being injured again.  Resting your injured muscle.  Icing your injured muscle.  Applying pressure (compression) to your injured muscle. This may be done with a splint or elastic bandage.  Raising (elevating) your injured muscle. Your doctor may also recommend medicine for pain. Follow these instructions at home: If you have a splint:  Wear the splint as told by your doctor. Take it off only as told by your doctor.  Loosen the splint if your fingers or toes tingle, get numb, or turn cold and blue.  Keep the splint clean.  If the splint is not waterproof: ? Do not let it get wet. ? Cover it with a watertight covering when you take a bath or a shower. Managing pain, stiffness, and swelling   If directed, put ice on your injured area. ? If you have a removable splint, take it off as told by your doctor. ? Put ice in a plastic bag. ? Place a towel between your skin and the bag. ? Leave the ice on for 20 minutes, 2-3 times a day.  Move your fingers or toes often. This helps to avoid stiffness and lessen swelling.  Raise your injured area above the level of your heart while you are sitting or lying down.  Wear an elastic bandage as told by your doctor. Make sure it is not too tight. General instructions  Take over-the-counter and prescription medicines only as told by your doctor.  Limit your activity. Rest your injured muscle as told by your doctor. Your doctor may say that gentle movements are okay.  If physical therapy was prescribed, do exercises as told by your doctor.  Do not put pressure on any  part of the splint until it is fully hardened. This may take many hours.  Do not use any products that contain nicotine or tobacco, such as cigarettes and e-cigarettes. These can delay bone healing. If you need help quitting, ask your doctor.  Warm up before you exercise. This helps to prevent more muscle strains.  Ask your doctor when it is safe to drive if you have a splint.  Keep all follow-up visits as told by your doctor. This is important. Contact a doctor if:  You have more pain or swelling in your injured area. Get help right away if:  You have any of these problems in your injured area: ? You have numbness. ? You have tingling. ? You lose a lot of strength. Summary  A muscle strain is an injury that happens when a muscle is stretched longer than normal.  This condition is first treated with PRICE therapy. This includes protecting, resting, icing, adding pressure, and raising your injury.  Limit your activity. Rest your injured muscle as told by your doctor. Your doctor may say that gentle movements are okay.  Warm up before you exercise. This helps to prevent more muscle strains. This information is not intended to replace advice given to you by your health care provider. Make sure you discuss any questions you have with your health care provider. Document Revised: 04/13/2018 Document Reviewed: 03/24/2016 Elsevier Patient Education  2020 Elsevier Inc.  

## 2019-08-01 NOTE — Progress Notes (Signed)
Subjective:    Patient ID: Anthony Castillo, male    DOB: 1965-04-17, 54 y.o.   MRN: [id number]  HPI  Patient presents to the clinic today with complaint of left lower back pain. He reports this started 1-2 weeks ago. He describes the pain as sharp and stabbing. The pain radiates into his left hip. The pain is worse with movement. He reports associated muscle tightness. He denies numbness, tingling or weakness in his left leg. He denies any injury to the area.  He has a history of chronic joint pain and neuropathy for which he takes Tylenol Arthritis, Aleve, Celebrex and Gabapentin as prescribed.  He has taken Zanaflex in the past with some relief and would like this refilled today.  Review of Systems      Past Medical History:  Diagnosis Date  . Chronic pain 04/09/2011  . DEPRESSION 03/16/2007  . GERD (gastroesophageal reflux disease)   . HYPERTENSION 09/22/2006  . Hypogonadism male 04/09/2011  . Morbid obesity (HCC) 09/22/2006  . Osteoarthritis 04/09/2011  . Sleep apnea    uses a cpap-will use after surgery  . Wears glasses     Current Outpatient Medications  Medication Sig Dispense Refill  . Acetaminophen (TYLENOL ARTHRITIS PAIN PO) Take by mouth as needed.    Marland Kitchen amLODipine (NORVASC) 10 MG tablet Take 1 tablet (10 mg total) by mouth daily. 90 tablet 3  . atorvastatin (LIPITOR) 20 MG tablet Take 1 tablet (20 mg total) by mouth daily. 90 tablet 3  . buPROPion (WELLBUTRIN SR) 150 MG 12 hr tablet   0  . celecoxib (CELEBREX) 100 MG capsule TAKE 1 CAPSULE BY MOUTH TWICE DAILY PATIENT NEEDS TO SCHEDULE PHYSICAL FOR JULY. 60 capsule 1  . cetirizine (ZYRTEC) 10 MG tablet Take 10 mg by mouth daily.    . Cholecalciferol (VITAMIN D3) 1000 units CAPS Take 1 capsule by mouth daily.    Marland Kitchen ezetimibe (ZETIA) 10 MG tablet Take 1 tablet (10 mg total) by mouth daily. 90 tablet 3  . fluocinonide cream (LIDEX) 0.05 % as needed.   1  . FLUoxetine (PROZAC) 40 MG capsule TAKE 1 CAPSULE BY MOUTH ONCE DAILY 90  capsule 0  . fluticasone (KLS ALLER-FLO) 50 MCG/ACT nasal spray Place 2 sprays into both nostrils as needed.     . gabapentin (NEURONTIN) 300 MG capsule TAKE 1 CAPSULE BY MOUTH EVERY NIGHT AT BEDTIME 30 capsule 3  . glipiZIDE (GLUCOTROL) 10 MG tablet Take 1 tablet (10 mg total) by mouth 2 (two) times daily before a meal. MUST SCHEDULE PHYSICAL 60 tablet 0  . lamoTRIgine (LAMICTAL) 100 MG tablet Take 1 tablet (100 mg total) by mouth every morning. MUST SCHEDULE PHYSICAL EXAM 30 tablet 0  . lisinopril-hydrochlorothiazide (ZESTORETIC) 20-12.5 MG tablet Take 2 tablets by mouth daily. 180 tablet 3  . MEGARED OMEGA-3 KRILL OIL PO Take 750 mg by mouth daily.    . Misc Natural Products (NF FORMULAS TESTOSTERONE) CAPS Take 1 capsule by mouth 2 (two) times daily.    . pantoprazole (PROTONIX) 40 MG tablet Take 1 tablet (40 mg total) by mouth daily. Please call to schedule Physical for July 90 tablet 0  . potassium chloride (KLOR-CON) 10 MEQ tablet TAKE 1 TABLET BY MOUTH ONCE DAILY 30 tablet 3  . sitaGLIPtin (JANUVIA) 100 MG tablet Take 1 tablet (100 mg total) by mouth daily. 30 tablet 1  . Syringe/Needle, Disp, (SYRINGE 3CC/20GX1") 20G X 1" 3 ML MISC 1 Dose by Does not apply route  every 14 (fourteen) days. 12 each 0  . testosterone cypionate (DEPOTESTOSTERONE CYPIONATE) 200 MG/ML injection INJECT 1 ML (200 MG) INTO THE MUSCLE EVERY 14 DAYS 2 mL 0  . tiZANidine (ZANAFLEX) 4 MG tablet TAKE 1 TABLET BY MOUTH DAILY AT BEDTIME AS NEEDED FOR MUSCLE SPASMS. 30 tablet 0   No current facility-administered medications for this visit.    Allergies  Allergen Reactions  . Penicillins     REACTION: unspecified    Family History  Problem Relation Age of Onset  . Cancer Mother        lymphoma  . Heart disease Father   . Hypertension Father   . Diabetes Sister   . Stroke Paternal Uncle   . Diabetes Maternal Grandmother   . Arthritis Paternal Grandmother   . Heart disease Paternal Grandfather   . Migraines  Daughter   . Irritable bowel syndrome Daughter   . Heart Problems Son        valve     Social History   Socioeconomic History  . Marital status: Married    Spouse name: Not on file  . Number of children: Not on file  . Years of education: Not on file  . Highest education level: Not on file  Occupational History  . Not on file  Tobacco Use  . Smoking status: Never Smoker  . Smokeless tobacco: Current User    Types: Chew  . Tobacco comment: has been using chew 40 years  Substance and Sexual Activity  . Alcohol use: Yes    Comment: rare  . Drug use: Never  . Sexual activity: Yes  Other Topics Concern  . Not on file  Social History Narrative  . Not on file   Social Determinants of Health   Financial Resource Strain:   . Difficulty of Paying Living Expenses:   Food Insecurity:   . Worried About Programme researcher, broadcasting/film/video in the Last Year:   . Barista in the Last Year:   Transportation Needs:   . Freight forwarder (Medical):   Marland Kitchen Lack of Transportation (Non-Medical):   Physical Activity:   . Days of Exercise per Week:   . Minutes of Exercise per Session:   Stress:   . Feeling of Stress :   Social Connections:   . Frequency of Communication with Friends and Family:   . Frequency of Social Gatherings with Friends and Family:   . Attends Religious Services:   . Active Member of Clubs or Organizations:   . Attends Banker Meetings:   Marland Kitchen Marital Status:   Intimate Partner Violence:   . Fear of Current or Ex-Partner:   . Emotionally Abused:   Marland Kitchen Physically Abused:   . Sexually Abused:      Constitutional: Denies fever, malaise, fatigue, headache or abrupt weight changes.  Respiratory: Denies difficulty breathing, shortness of breath, cough or sputum production.   Cardiovascular: Denies chest pain, chest tightness, palpitations or swelling in the hands or feet.  Gastrointestinal: Denies abdominal pain, bloating, constipation, diarrhea or blood in the  stool.  GU: Denies urgency, frequency, pain with urination, burning sensation, blood in urine, odor or discharge. Musculoskeletal: Pt reports left lower back pain. Denies decrease in range of motion, difficulty with gait, or joint swelling.  Skin: Denies redness, rashes, lesions or ulcercations.  Neurological: Pt reports numbness, tingling, weakness or problems with balance and coordination.    No other specific complaints in a complete review of systems (except as  listed in HPI above).  Objective:   Physical Exam   BP 134/70   Pulse 87   Temp 98.4 F (36.9 C) (Temporal)   Wt (!) 352 lb (159.7 kg)   SpO2 96%   BMI 47.74 kg/m   Wt Readings from Last 3 Encounters:  04/09/19 (!) 349 lb 8 oz (158.5 kg)  10/16/18 (!) 352 lb (159.7 kg)  09/27/18 (!) 352 lb 6.4 oz (159.8 kg)    General: Appears his stated age, obese, in NAD. Skin: Warm, dry and intact. No rashes noted. Cardiovascular: Normal rate and rhythm. S1,S2 noted.  No murmur, rubs or gallops noted.  Pulmonary/Chest: Normal effort and positive vesicular breath sounds. No respiratory distress. No wheezes, rales or ronchi noted.  Musculoskeletal: Normal flexion and rotation of the spine. Decreased extension and lateral bending due to pain. No bony tenderness noted over the spine. Pain with palpation of the left paralumbar muscles. Strength 5/5 BLE. Can stand on heels and toes. Neurological: Alert and oriented.    BMET    Component Value Date/Time   NA 136 09/12/2018 0911   K 4.1 09/12/2018 0911   CL 100 09/12/2018 0911   CO2 27 09/12/2018 0911   GLUCOSE 161 (H) 09/12/2018 0911   BUN 14 09/12/2018 0911   CREATININE 1.04 09/12/2018 0911   CALCIUM 8.9 09/12/2018 0911   GFRNONAA >90 05/15/2013 0922   GFRAA >90 05/15/2013 0922    Lipid Panel     Component Value Date/Time   CHOL 102 09/12/2018 0911   TRIG 172.0 (H) 09/12/2018 0911   HDL 29.20 (L) 09/12/2018 0911   CHOLHDL 3 09/12/2018 0911   VLDL 34.4 09/12/2018 0911     LDLCALC 38 09/12/2018 0911    CBC    Component Value Date/Time   WBC 5.3 09/12/2018 0911   RBC 5.04 09/12/2018 0911   HGB 13.2 09/12/2018 0911   HCT 40.3 09/12/2018 0911   PLT 206.0 09/12/2018 0911   MCV 80.0 09/12/2018 0911   MCHC 32.8 09/12/2018 0911   RDW 15.3 09/12/2018 0911    Hgb A1C Lab Results  Component Value Date   HGBA1C 8.7 (H) 09/12/2018           Assessment & Plan:   Left Low Back Pain:  No indication for xray at this time Toradol 60 mg IM x 1 RX for Zanaflex 6 mg TID prn RX for Tramadol 50 1 tab PO Q8H prn Encouraged stretching, heat and massage  Return precautions discussed Webb Silversmith, NP This visit occurred during the SARS-CoV-2 public health emergency.  Safety protocols were in place, including screening questions prior to the visit, additional usage of staff PPE, and extensive cleaning of exam room while observing appropriate contact time as indicated for disinfecting solutions.

## 2019-08-10 ENCOUNTER — Other Ambulatory Visit: Payer: Self-pay | Admitting: Internal Medicine

## 2019-09-17 ENCOUNTER — Encounter: Payer: 59 | Admitting: Internal Medicine

## 2019-09-17 DIAGNOSIS — Z0289 Encounter for other administrative examinations: Secondary | ICD-10-CM

## 2019-09-17 NOTE — Progress Notes (Deleted)
Subjective:    Patient ID: Anthony Castillo, male    DOB: 1965/07/25, 54 y.o.   MRN: [id number]  HPI  Patient presents the clinic today for his annual exam.  He is also due to follow-up chronic conditions.  HTN: His BP today is.  He is taking Amlodipine and Lisinopril HCT as prescribed.  ECG from 04/2019 reviewed.  DM2: His last A1c was 8.7, 08/2018.  He is taking Glipizide as prescribed.  He does not monitor his sugars.  He does check his feet routinely.  He is taking Gabapentin for neuropathic pain.  He does not follow with endocrinology.  OA: Managed on Celebrex: He no longer follows with rheumatology.  Depression: Chronic dysthymia.  He is taking Fluoxetine, Wellbutrin and Lamictal as prescribed.  He is not seeing a therapist or psychiatry at this time.  He denies anxiety, SI/HI.  GERD: He denies breakthrough on Pantoprazole.  There is no upper GI on file.  Hypogonadism.  Managed with Testosterone injections that he gives himself.  He does not follow with urology.  HLD: His last LDL was 38, 08/2018.  He denies myalgias on Atorvastatin and Fish Oil.  He does not consume a low-fat diet.  OSA: He averages hours of sleep per night with the use of CPAP.  Sleep study from 04/2003 reviewed.    Flu: 11/2018 Tetanus: 07/2016 Pneumovax: 07/2016 Prevnar: 11/2017 Covid: Shingrix: 03/2018, 09/2018 PSA screening: 08/2018 Colon screening: Vision screening: Dentist:  Diet: Exercise:  REVIEW of SYSTEMS:  Past Medical History:  Diagnosis Date  . Chronic pain 04/09/2011  . DEPRESSION 03/16/2007  . GERD (gastroesophageal reflux disease)   . HYPERTENSION 09/22/2006  . Hypogonadism male 04/09/2011  . Morbid obesity (HCC) 09/22/2006  . Osteoarthritis 04/09/2011  . Sleep apnea    uses a cpap-will use after surgery  . Wears glasses     Current Outpatient Medications  Medication Sig Dispense Refill  . Acetaminophen (TYLENOL ARTHRITIS PAIN PO) Take by mouth as needed.    Marland Kitchen amLODipine (NORVASC) 10 MG  tablet Take 1 tablet (10 mg total) by mouth daily. 90 tablet 3  . atorvastatin (LIPITOR) 20 MG tablet Take 1 tablet (20 mg total) by mouth daily. 90 tablet 3  . buPROPion (WELLBUTRIN SR) 150 MG 12 hr tablet   0  . celecoxib (CELEBREX) 100 MG capsule TAKE 1 CAPSULE BY MOUTH TWICE DAILY PATIENT NEEDS TO SCHEDULE PHYSICAL FOR JULY. 60 capsule 1  . cetirizine (ZYRTEC) 10 MG tablet Take 10 mg by mouth daily.    . Cholecalciferol (VITAMIN D3) 1000 units CAPS Take 1 capsule by mouth daily.    Marland Kitchen ezetimibe (ZETIA) 10 MG tablet Take 1 tablet (10 mg total) by mouth daily. 90 tablet 3  . fluocinonide cream (LIDEX) 0.05 % as needed.   1  . FLUoxetine (PROZAC) 40 MG capsule TAKE 1 CAPSULE BY MOUTH ONCE DAILY 90 capsule 0  . fluticasone (KLS ALLER-FLO) 50 MCG/ACT nasal spray Place 2 sprays into both nostrils as needed.     . gabapentin (NEURONTIN) 300 MG capsule TAKE 1 CAPSULE BY MOUTH EVERY NIGHT AT BEDTIME 30 capsule 3  . glipiZIDE (GLUCOTROL) 10 MG tablet Take 1 tablet (10 mg total) by mouth 2 (two) times daily before a meal. MUST SCHEDULE PHYSICAL 60 tablet 0  . JANUVIA 100 MG tablet TAKE ONE TABLET BY MOUTH ONCE DAILY 30 tablet 1  . lamoTRIgine (LAMICTAL) 100 MG tablet Take 1 tablet (100 mg total) by mouth every morning. MUST SCHEDULE  PHYSICAL EXAM 30 tablet 0  . lisinopril-hydrochlorothiazide (ZESTORETIC) 20-12.5 MG tablet Take 2 tablets by mouth daily. 180 tablet 3  . MEGARED OMEGA-3 KRILL OIL PO Take 750 mg by mouth daily.    . Misc Natural Products (NF FORMULAS TESTOSTERONE) CAPS Take 1 capsule by mouth 2 (two) times daily.    . pantoprazole (PROTONIX) 40 MG tablet Take 1 tablet (40 mg total) by mouth daily. Please call to schedule Physical for July 90 tablet 0  . potassium chloride (KLOR-CON) 10 MEQ tablet TAKE 1 TABLET BY MOUTH ONCE DAILY 30 tablet 3  . Syringe/Needle, Disp, (SYRINGE 3CC/20GX1") 20G X 1" 3 ML MISC 1 Dose by Does not apply route every 14 (fourteen) days. 12 each 0  . testosterone  cypionate (DEPOTESTOSTERONE CYPIONATE) 200 MG/ML injection INJECT 1 ML (200 MG) INTO THE MUSCLE EVERY 14 DAYS 2 mL 0  . tizanidine (ZANAFLEX) 6 MG capsule Take 1 capsule (6 mg total) by mouth 3 (three) times daily as needed for muscle spasms. 20 capsule 0   No current facility-administered medications for this visit.    Allergies  Allergen Reactions  . Penicillins     REACTION: unspecified    Family History  Problem Relation Age of Onset  . Cancer Mother        lymphoma  . Heart disease Father   . Hypertension Father   . Diabetes Sister   . Stroke Paternal Uncle   . Diabetes Maternal Grandmother   . Arthritis Paternal Grandmother   . Heart disease Paternal Grandfather   . Migraines Daughter   . Irritable bowel syndrome Daughter   . Heart Problems Son        valve     Social History   Socioeconomic History  . Marital status: Married    Spouse name: Not on file  . Number of children: Not on file  . Years of education: Not on file  . Highest education level: Not on file  Occupational History  . Not on file  Tobacco Use  . Smoking status: Never Smoker  . Smokeless tobacco: Current User    Types: Chew  . Tobacco comment: has been using chew 40 years  Vaping Use  . Vaping Use: Never used  Substance and Sexual Activity  . Alcohol use: Yes    Comment: rare  . Drug use: Never  . Sexual activity: Yes  Other Topics Concern  . Not on file  Social History Narrative  . Not on file   Social Determinants of Health   Financial Resource Strain:   . Difficulty of Paying Living Expenses:   Food Insecurity:   . Worried About Programme researcher, broadcasting/film/videounning Out of Food in the Last Year:   . Baristaan Out of Food in the Last Year:   Transportation Needs:   . Freight forwarderLack of Transportation (Medical):   Marland Kitchen. Lack of Transportation (Non-Medical):   Physical Activity:   . Days of Exercise per Week:   . Minutes of Exercise per Session:   Stress:   . Feeling of Stress :   Social Connections:   . Frequency of  Communication with Friends and Family:   . Frequency of Social Gatherings with Friends and Family:   . Attends Religious Services:   . Active Member of Clubs or Organizations:   . Attends BankerClub or Organization Meetings:   Marland Kitchen. Marital Status:   Intimate Partner Violence:   . Fear of Current or Ex-Partner:   . Emotionally Abused:   Marland Kitchen. Physically Abused:   .  Sexually Abused:      Constitutional: Denies fever, malaise, fatigue, headache or abrupt weight changes.  HEENT: Denies eye pain, eye redness, ear pain, ringing in the ears, wax buildup, runny nose, nasal congestion, bloody nose, or sore throat. Respiratory: Denies difficulty breathing, shortness of breath, cough or sputum production.   Cardiovascular: Denies chest pain, chest tightness, palpitations or swelling in the hands or feet.  Gastrointestinal: Denies abdominal pain, bloating, constipation, diarrhea or blood in the stool.  GU: Denies urgency, frequency, pain with urination, burning sensation, blood in urine, odor or discharge. Musculoskeletal: Pt reports chronic joint pain. Denies decrease in range of motion, difficulty with gait, muscle pain or joint swelling.  Skin: Denies redness, rashes, lesions or ulcercations.  Neurological: Denies dizziness, difficulty with memory, difficulty with speech or problems with balance and coordination.  Psych: Denies anxiety, depression, SI/HI.  No other specific complaints in a complete review of systems (except as listed in HPI above).  Objective:   Physical Exam   There were no vitals taken for this visit. Wt Readings from Last 3 Encounters:  08/01/19 (!) 352 lb (159.7 kg)  04/09/19 (!) 349 lb 8 oz (158.5 kg)  10/16/18 (!) 352 lb (159.7 kg)    General: Appears their stated age, well developed, well nourished in NAD. Skin: Warm, dry and intact. No rashes, lesions or ulcerations noted. HEENT: Head: normal shape and size; Eyes: sclera white, no icterus, conjunctiva pink, PERRLA and EOMs  intact; Ears: Tm's gray and intact, normal light reflex; Nose: mucosa pink and moist, septum midline; Throat/Mouth: Teeth present, mucosa pink and moist, no exudate, lesions or ulcerations noted.  Neck:  Neck supple, trachea midline. No masses, lumps or thyromegaly present.  Cardiovascular: Normal rate and rhythm. S1,S2 noted.  No murmur, rubs or gallops noted. No JVD or BLE edema. No carotid bruits noted. Pulmonary/Chest: Normal effort and positive vesicular breath sounds. No respiratory distress. No wheezes, rales or ronchi noted.  Abdomen: Soft and nontender. Normal bowel sounds. No distention or masses noted. Liver, spleen and kidneys non palpable. Musculoskeletal: Normal range of motion. No signs of joint swelling. No difficulty with gait.  Neurological: Alert and oriented. Cranial nerves II-XII grossly intact. Coordination normal.  Psychiatric: Mood and affect normal. Behavior is normal. Judgment and thought content normal.   EKG:  BMET    Component Value Date/Time   NA 136 09/12/2018 0911   K 4.1 09/12/2018 0911   CL 100 09/12/2018 0911   CO2 27 09/12/2018 0911   GLUCOSE 161 (H) 09/12/2018 0911   BUN 14 09/12/2018 0911   CREATININE 1.04 09/12/2018 0911   CALCIUM 8.9 09/12/2018 0911   GFRNONAA >90 05/15/2013 0922   GFRAA >90 05/15/2013 0922    Lipid Panel     Component Value Date/Time   CHOL 102 09/12/2018 0911   TRIG 172.0 (H) 09/12/2018 0911   HDL 29.20 (L) 09/12/2018 0911   CHOLHDL 3 09/12/2018 0911   VLDL 34.4 09/12/2018 0911   LDLCALC 38 09/12/2018 0911    CBC    Component Value Date/Time   WBC 5.3 09/12/2018 0911   RBC 5.04 09/12/2018 0911   HGB 13.2 09/12/2018 0911   HCT 40.3 09/12/2018 0911   PLT 206.0 09/12/2018 0911   MCV 80.0 09/12/2018 0911   MCHC 32.8 09/12/2018 0911   RDW 15.3 09/12/2018 0911    Hgb A1C Lab Results  Component Value Date   HGBA1C 8.7 (H) 09/12/2018  Assessment & Plan:   Preventative Health  Maintenance:  Encouraged him to get a flu shot in the fall Tetanus, pneumovax, prevnar and shingrix UTD Covid Colon screening Encouraged him to consume a balanced diet and exercise regimen Advised him to see an eye doctor and dentist annually Will check CBC, CMET, Lipid, A1C and PSA today  RTC in 3 months, follow up DM 2 Nicki Reaper, NP This visit occurred during the SARS-CoV-2 public health emergency.  Safety protocols were in place, including screening questions prior to the visit, additional usage of staff PPE, and extensive cleaning of exam room while observing appropriate contact time as indicated for disinfecting solutions.

## 2019-09-20 ENCOUNTER — Other Ambulatory Visit: Payer: Self-pay | Admitting: Internal Medicine

## 2019-09-27 ENCOUNTER — Other Ambulatory Visit: Payer: Self-pay | Admitting: Internal Medicine

## 2019-09-27 DIAGNOSIS — M2550 Pain in unspecified joint: Secondary | ICD-10-CM

## 2019-09-27 DIAGNOSIS — M791 Myalgia, unspecified site: Secondary | ICD-10-CM

## 2019-10-08 ENCOUNTER — Other Ambulatory Visit: Payer: Self-pay | Admitting: Internal Medicine

## 2019-10-18 ENCOUNTER — Other Ambulatory Visit: Payer: Self-pay | Admitting: Internal Medicine

## 2019-10-23 ENCOUNTER — Ambulatory Visit: Payer: 59 | Admitting: Internal Medicine

## 2019-10-26 ENCOUNTER — Other Ambulatory Visit: Payer: Self-pay | Admitting: Internal Medicine

## 2019-10-26 DIAGNOSIS — M791 Myalgia, unspecified site: Secondary | ICD-10-CM

## 2019-10-26 DIAGNOSIS — M2550 Pain in unspecified joint: Secondary | ICD-10-CM

## 2019-10-29 ENCOUNTER — Other Ambulatory Visit: Payer: Self-pay | Admitting: Internal Medicine

## 2019-11-12 ENCOUNTER — Other Ambulatory Visit: Payer: Self-pay | Admitting: Internal Medicine

## 2019-11-27 ENCOUNTER — Other Ambulatory Visit: Payer: Self-pay | Admitting: Internal Medicine

## 2019-11-27 DIAGNOSIS — M791 Myalgia, unspecified site: Secondary | ICD-10-CM

## 2019-11-27 DIAGNOSIS — M2550 Pain in unspecified joint: Secondary | ICD-10-CM

## 2019-12-11 ENCOUNTER — Other Ambulatory Visit: Payer: Self-pay | Admitting: Internal Medicine

## 2019-12-13 MED ORDER — GLIPIZIDE 10 MG PO TABS: ORAL_TABLET | ORAL | 0 refills | Status: DC

## 2019-12-13 MED ORDER — SITAGLIPTIN PHOSPHATE 100 MG PO TABS
100.0000 mg | ORAL_TABLET | Freq: Every day | ORAL | 0 refills | Status: DC
Start: 2019-12-13 — End: 2020-01-10

## 2019-12-18 ENCOUNTER — Telehealth: Payer: Self-pay | Admitting: Internal Medicine

## 2019-12-18 NOTE — Telephone Encounter (Signed)
Unable to reach patient called both phones no answer and no vm. Attempting to call and reschedule patient for cpe on 12/24/19.

## 2019-12-24 ENCOUNTER — Encounter: Payer: 59 | Admitting: Internal Medicine

## 2019-12-25 ENCOUNTER — Other Ambulatory Visit: Payer: Self-pay | Admitting: Internal Medicine

## 2019-12-25 DIAGNOSIS — M2550 Pain in unspecified joint: Secondary | ICD-10-CM

## 2019-12-25 DIAGNOSIS — M791 Myalgia, unspecified site: Secondary | ICD-10-CM

## 2020-01-07 ENCOUNTER — Encounter: Payer: Self-pay | Admitting: Family Medicine

## 2020-01-07 ENCOUNTER — Ambulatory Visit: Payer: 59 | Admitting: Family Medicine

## 2020-01-07 ENCOUNTER — Other Ambulatory Visit: Payer: Self-pay

## 2020-01-07 ENCOUNTER — Other Ambulatory Visit: Payer: Self-pay | Admitting: Internal Medicine

## 2020-01-07 VITALS — BP 128/64 | HR 84 | Temp 98.0°F | Wt 354.5 lb

## 2020-01-07 DIAGNOSIS — L0291 Cutaneous abscess, unspecified: Secondary | ICD-10-CM

## 2020-01-07 DIAGNOSIS — Z23 Encounter for immunization: Secondary | ICD-10-CM

## 2020-01-07 NOTE — Progress Notes (Signed)
Subjective:     Anthony Castillo is a 54 y.o. male presenting for Mass (Right lower side of back. opened over weekend)     HPI   #cyst or boil - hx of having one in the same spot which healed over - increasing in size over the weekend and ruptured - not sure if the whole things drained - no pain - covered with a bandaid which continues to be wet  Review of Systems   Social History   Tobacco Use  Smoking Status Never Smoker  Smokeless Tobacco Current User  . Types: Chew  Tobacco Comment   has been using chew 40 years        Objective:    BP Readings from Last 3 Encounters:  01/07/20 128/64  08/01/19 134/70  04/09/19 130/78   Wt Readings from Last 3 Encounters:  01/07/20 (!) 354 lb 8 oz (160.8 kg)  08/01/19 (!) 352 lb (159.7 kg)  04/09/19 (!) 349 lb 8 oz (158.5 kg)    BP 128/64   Pulse 84   Temp 98 F (36.7 C) (Temporal)   Wt (!) 354 lb 8 oz (160.8 kg)   SpO2 97%   BMI 48.08 kg/m    Physical Exam Constitutional:      Appearance: Normal appearance. He is not ill-appearing or diaphoretic.  HENT:     Right Ear: External ear normal.     Left Ear: External ear normal.     Nose: Nose normal.  Eyes:     General: No scleral icterus.    Extraocular Movements: Extraocular movements intact.     Conjunctiva/sclera: Conjunctivae normal.  Cardiovascular:     Rate and Rhythm: Normal rate.  Pulmonary:     Effort: Pulmonary effort is normal.  Musculoskeletal:     Cervical back: Neck supple.  Skin:    General: Skin is warm and dry.     Comments: Back: mid thoracic right side with abscess with purulent drainage expressed but not fully draining.   Neurological:     Mental Status: He is alert. Mental status is at baseline.  Psychiatric:        Mood and Affect: Mood normal.           Assessment & Plan:   Problem List Items Addressed This Visit    None    Visit Diagnoses    Abscess    -  Primary   Need for influenza vaccination       Relevant  Orders   Flu Vaccine QUAD 36+ mos IM     Procedure Indication: abscess  Risks: Discussed with patient risk for bleeding, infection, inadequate drainage  Verbal consent obtained from patient. Discussed risks and benefits and questions answered. Chaperone present Yes.   Equipment: 11 blade, curved hemostats   Medications and dosage and quantity: 0.5 cc of 1% lidocaine with epi  Area was cleaned with iodine x 3. 0.5 cc of 1% lidocaine with epi was used for pain control. An 11 blade was used to make a sterile incision. Purulent and occasionally bloody drainage expressed. The wound was packed with sterile gauze.   Procedure was uncomplicated and patient tolerated it well.   He was instructed to repack the wound in 2 days and to follow-up if redness, increase in size or fever/chills.      Return if symptoms worsen or fail to improve, for if worsening redness.  Lynnda Child, MD  This visit occurred during the SARS-CoV-2 public health emergency.  Safety protocols were in place, including screening questions prior to the visit, additional usage of staff PPE, and extensive cleaning of exam room while observing appropriate contact time as indicated for disinfecting solutions.

## 2020-01-07 NOTE — Patient Instructions (Signed)
Keep covered Removing packing in 2 days and replace if there is still a cavity Continue to replace packing tape every 1-2 days until you are unable to put any in the space   Incision and Drainage, Care After This sheet gives you information about how to care for yourself after your procedure. Your health care provider may also give you more specific instructions. If you have problems or questions, contact your health care provider. What can I expect after the procedure? After the procedure, it is common to have:  Pain or discomfort around the incision site.  Blood, fluid, or pus (drainage) from the incision.  Redness and firm skin around the incision site. Follow these instructions at home: Medicines  Take over-the-counter and prescription medicines only as told by your health care provider.  If you were prescribed an antibiotic medicine, use or take it as told by your health care provider. Do not stop using the antibiotic even if you start to feel better. Wound care Follow instructions from your health care provider about how to take care of your wound. Make sure you:  Wash your hands with soap and water before and after you change your bandage (dressing). If soap and water are not available, use hand sanitizer.  Change your dressing and packing as told by your health care provider. ? If your dressing is dry or stuck when you try to remove it, moisten or wet the dressing with saline or water so that it can be removed without harming your skin or tissues. ? If your wound is packed, leave it in place until your health care provider tells you to remove it. To remove the packing, moisten or wet the packing with saline or water so that it can be removed without harming your skin or tissues.  Leave stitches (sutures), skin glue, or adhesive strips in place. These skin closures may need to stay in place for 2 weeks or longer. If adhesive strip edges start to loosen and curl up, you may trim  the loose edges. Do not remove adhesive strips completely unless your health care provider tells you to do that. Check your wound every day for signs of infection. Check for:  More redness, swelling, or pain.  More fluid or blood.  Warmth.  Pus or a bad smell. If you were sent home with a drain tube in place, follow instructions from your health care provider about:  How to empty it.  How to care for it at home.  General instructions  Rest the affected area.  Do not take baths, swim, or use a hot tub until your health care provider approves. Ask your health care provider if you may take showers. You may only be allowed to take sponge baths.  Return to your normal activities as told by your health care provider. Ask your health care provider what activities are safe for you. Your health care provider may put you on activity or lifting restrictions.  The incision will continue to drain. It is normal to have some clear or slightly bloody drainage. The amount of drainage should lessen each day.  Do not apply any creams, ointments, or liquids unless you have been told to by your health care provider.  Keep all follow-up visits as told by your health care provider. This is important. Contact a health care provider if:  Your cyst or abscess returns.  You have a fever or chills.  You have more redness, swelling, or pain around your incision.  You have more fluid or blood coming from your incision.  Your incision feels warm to the touch.  You have pus or a bad smell coming from your incision.  You have red streaks above or below the incision site. Get help right away if:  You have severe pain or bleeding.  You cannot eat or drink without vomiting.  You have decreased urine output.  You become short of breath.  You have chest pain.  You cough up blood.  The affected area becomes numb or starts to tingle. These symptoms may represent a serious problem that is an  emergency. Do not wait to see if the symptoms will go away. Get medical help right away. Call your local emergency services (911 in the [place] Do not drive yourself to the hospital. Summary  After this procedure, it is common to have fluid, blood, or pus coming from the surgery site.  Follow all home care instructions. You will be told how to take care of your incision, how to check for infection, and how to take medicines.  If you were prescribed an antibiotic medicine, take it as told by your health care provider. Do not stop taking the antibiotic even if you start to feel better.  Contact a health care provider if you have increased redness, swelling, or pain around your incision. Get help right away if you have chest pain, you vomit, you cough up blood, or you have shortness of breath.  Keep all follow-up visits as told by your health care provider. This is important. This information is not intended to replace advice given to you by your health care provider. Make sure you discuss any questions you have with your health care provider. Document Revised: 01/16/2018 Document Reviewed: 01/16/2018 Elsevier Patient Education  2020 [employer].

## 2020-01-10 ENCOUNTER — Other Ambulatory Visit: Payer: Self-pay | Admitting: Internal Medicine

## 2020-01-23 ENCOUNTER — Ambulatory Visit (INDEPENDENT_AMBULATORY_CARE_PROVIDER_SITE_OTHER): Payer: 59 | Admitting: Internal Medicine

## 2020-01-23 ENCOUNTER — Encounter: Payer: Self-pay | Admitting: Internal Medicine

## 2020-01-23 ENCOUNTER — Other Ambulatory Visit: Payer: Self-pay

## 2020-01-23 VITALS — BP 140/76 | HR 79 | Temp 98.3°F | Ht 72.0 in | Wt 352.0 lb

## 2020-01-23 DIAGNOSIS — E7800 Pure hypercholesterolemia, unspecified: Secondary | ICD-10-CM

## 2020-01-23 DIAGNOSIS — R3911 Hesitancy of micturition: Secondary | ICD-10-CM

## 2020-01-23 DIAGNOSIS — Z0001 Encounter for general adult medical examination with abnormal findings: Secondary | ICD-10-CM | POA: Diagnosis not present

## 2020-01-23 DIAGNOSIS — F324 Major depressive disorder, single episode, in partial remission: Secondary | ICD-10-CM

## 2020-01-23 DIAGNOSIS — E291 Testicular hypofunction: Secondary | ICD-10-CM

## 2020-01-23 DIAGNOSIS — N401 Enlarged prostate with lower urinary tract symptoms: Secondary | ICD-10-CM

## 2020-01-23 DIAGNOSIS — Z125 Encounter for screening for malignant neoplasm of prostate: Secondary | ICD-10-CM | POA: Diagnosis not present

## 2020-01-23 DIAGNOSIS — M8949 Other hypertrophic osteoarthropathy, multiple sites: Secondary | ICD-10-CM

## 2020-01-23 DIAGNOSIS — M159 Polyosteoarthritis, unspecified: Secondary | ICD-10-CM

## 2020-01-23 DIAGNOSIS — E119 Type 2 diabetes mellitus without complications: Secondary | ICD-10-CM

## 2020-01-23 DIAGNOSIS — K219 Gastro-esophageal reflux disease without esophagitis: Secondary | ICD-10-CM

## 2020-01-23 DIAGNOSIS — G4733 Obstructive sleep apnea (adult) (pediatric): Secondary | ICD-10-CM

## 2020-01-23 DIAGNOSIS — I10 Essential (primary) hypertension: Secondary | ICD-10-CM

## 2020-01-23 DIAGNOSIS — Z1211 Encounter for screening for malignant neoplasm of colon: Secondary | ICD-10-CM

## 2020-01-23 LAB — CBC
HCT: 40.5 % (ref 39.0–52.0)
Hemoglobin: 13.6 g/dL (ref 13.0–17.0)
MCHC: 33.6 g/dL (ref 30.0–36.0)
MCV: 81.1 fl (ref 78.0–100.0)
Platelets: 253 10*3/uL (ref 150.0–400.0)
RBC: 5 Mil/uL (ref 4.22–5.81)
RDW: 14.3 % (ref 11.5–15.5)
WBC: 8.1 10*3/uL (ref 4.0–10.5)

## 2020-01-23 LAB — COMPREHENSIVE METABOLIC PANEL
ALT: 35 U/L (ref 0–53)
AST: 28 U/L (ref 0–37)
Albumin: 4.5 g/dL (ref 3.5–5.2)
Alkaline Phosphatase: 84 U/L (ref 39–117)
BUN: 17 mg/dL (ref 6–23)
CO2: 25 mEq/L (ref 19–32)
Calcium: 9.7 mg/dL (ref 8.4–10.5)
Chloride: 94 mEq/L — ABNORMAL LOW (ref 96–112)
Creatinine, Ser: 1.2 mg/dL (ref 0.40–1.50)
GFR: 67.67 mL/min (ref >60.00)
Glucose, Bld: 426 mg/dL — ABNORMAL HIGH (ref 70–99)
Potassium: 4.5 mEq/L (ref 3.5–5.1)
Sodium: 129 mEq/L — ABNORMAL LOW (ref 135–145)
Total Bilirubin: 0.7 mg/dL (ref 0.2–1.2)
Total Protein: 7 g/dL (ref 6.0–8.3)

## 2020-01-23 LAB — LIPID PANEL
Cholesterol: 112 mg/dL (ref 0–200)
HDL: 37.3 mg/dL — ABNORMAL LOW (ref >39.00)
LDL Cholesterol: 46 mg/dL (ref 0–99)
NonHDL: 74.27
Total CHOL/HDL Ratio: 3
Triglycerides: 140 mg/dL (ref 0.0–149.0)
VLDL: 28 mg/dL (ref 0.0–40.0)

## 2020-01-23 LAB — PSA: PSA: 0.53 ng/mL (ref 0.10–4.00)

## 2020-01-23 LAB — HEMOGLOBIN A1C: Hgb A1c MFr Bld: 9 % — ABNORMAL HIGH (ref 4.6–6.5)

## 2020-01-23 MED ORDER — TAMSULOSIN HCL 0.4 MG PO CAPS: 0.4000 mg | ORAL_CAPSULE | Freq: Every day | ORAL | 3 refills | Status: DC

## 2020-01-23 NOTE — Patient Instructions (Signed)

## 2020-01-23 NOTE — Assessment & Plan Note (Signed)
Avoid foods that trigger your reflux CBC and CMET today Continue Pantoprazole, failed previous wean in the past

## 2020-01-23 NOTE — Assessment & Plan Note (Signed)
Continue Lamotrigine, Wellbutrin and Fluoxetine, wean not indicated at this time CMET today Support offered

## 2020-01-23 NOTE — Assessment & Plan Note (Signed)
CMET and lipid profile today Encouraged him to consume a low fat diet Continue Atorvastatin, Ezetimibe and Fish Oil

## 2020-01-23 NOTE — Assessment & Plan Note (Signed)
Continue Testosterone injections managed by urology

## 2020-01-23 NOTE — Assessment & Plan Note (Signed)
A1C today No urine microalbumin secondary to ACEI therapy Encouraged him to consume a low carb diet and exercise for weight loss Continue Glipizide, Januvia and Gapapentin Advised him to schedule an appt for an eye exam Encouraged routine foot exams He declines flu shot today Pneumovax and Covid UTD

## 2020-01-23 NOTE — Assessment & Plan Note (Signed)
Continue Tylenol, Celebrex and Gabapentin Encouraged exercise for weight loss, toning

## 2020-01-23 NOTE — Assessment & Plan Note (Signed)
Will trial Flomax 

## 2020-01-23 NOTE — Assessment & Plan Note (Signed)
Remains elevated- noncompliant with diet, weight loss, CPAP Continue Lisinopril HCT, Potassium and Amlodipine Reinforced DASH diet and exercise for weight loss CMET today Will monitor

## 2020-01-23 NOTE — Assessment & Plan Note (Signed)
Noncompliant with CPAP 

## 2020-01-23 NOTE — Progress Notes (Signed)
Subjective:    Patient ID: Anthony Castillo, male    DOB: [birth year], 54 y.o.   MRN: [id number]  HPI  Pt presents to the clinic today for his annual exam. He is also due to follow up chronic conditions.   Depression: Chronic dysthymia. Managed on Lamotrigine, Fluoxetine and Wellbutrin. He is not seeing a therapist. He denies anxiety, SI/HI.  DM 2: His last A1C was 8.7%, 08/2018. He does not check his sugars. He is taking Glipizide, Januvia and Gabapentin as prescribed. He does not check his feet routinely. His last eye exam was > 1 year ago  HTN: His BP today is 140/76. He is taking Lisinopril HCT, Potassium and Amlodipine as prescribed. ECG from 04/2019 reviewed.  GERD: Persistent. He denies breakthrough on Pantoprazole. There is no upper GI on file.  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 He follows with urology.  OA: Mainly in his hands, back, hips and knees. He is taking Celebrex and Gabapentin as prescribed with some relief. He also takes Tylenol arthritis for breakthrough.   HLD: His last LDL was 38, 08/2018. He denies myalgias on Atorvastatin, Ezetimibe and Fish Oil. He does not consume a low fat diet.  OSA: He is not using his CPAP, reports th emast does not fit properly. Sleep study from 2005 reviewed.  Flu: 11/2018 Tetanus: 07/2016 Pneumovax: 07/2016 Covid: Moderna Shingrix: 03/2018, 09/2018 PSA Screening: 08/2018 Colon Screening: 04/2009 Vision Screening: > 1 year ago Dentist: as needed, dentures  Diet: He does eat meat. He consumes fruits and veggies daily. He does eat fried foods. He drinks coffee, water, sweet tea. Exercise: None  Review of Systems      Past Medical History:  Diagnosis Date  . Chronic pain 04/09/2011  . DEPRESSION 03/16/2007  . GERD (gastroesophageal reflux disease)   . HYPERTENSION 09/22/2006  . Hypogonadism male 04/09/2011  . Morbid obesity (HCC) 09/22/2006  . Osteoarthritis 04/09/2011  . Sleep apnea    uses a cpap-will use after  surgery  . Wears glasses     Current Outpatient Medications  Medication Sig Dispense Refill  . Acetaminophen (TYLENOL ARTHRITIS PAIN PO) Take by mouth as needed.    Marland Kitchen amLODipine (NORVASC) 10 MG tablet TAKE 1 TABLET BY MOUTH ONCE A DAY 90 tablet 0  . atorvastatin (LIPITOR) 20 MG tablet Take 1 tablet (20 mg total) by mouth daily. 90 tablet 3  . augmented betamethasone dipropionate (DIPROLENE-AF) 0.05 % ointment Apply topically.    Marland Kitchen buPROPion (WELLBUTRIN SR) 150 MG 12 hr tablet   0  . celecoxib (CELEBREX) 100 MG capsule TAKE 1 CAPSULE BY MOUTH TWICE DAILY 60 capsule 0  . cetirizine (ZYRTEC) 10 MG tablet Take 10 mg by mouth daily.    . Cholecalciferol (VITAMIN D3) 1000 units CAPS Take 1 capsule by mouth daily.    Marland Kitchen ezetimibe (ZETIA) 10 MG tablet Take 1 tablet (10 mg total) by mouth daily. 90 tablet 3  . fluocinonide cream (LIDEX) 0.05 % as needed.   1  . FLUoxetine (PROZAC) 40 MG capsule TAKE 1 CAPSULE BY MOUTH ONCE DAILY 90 capsule 0  . fluticasone (KLS ALLER-FLO) 50 MCG/ACT nasal spray Place 2 sprays into both nostrils as needed.     . gabapentin (NEURONTIN) 300 MG capsule TAKE 1 CAPSULE BY MOUTH EVERY NIGHT AT BEDTIME 30 capsule 3  . glipiZIDE (GLUCOTROL) 10 MG tablet TAKE 1 TABLET BY MOUTH TWICE A DAY BEFORE A MEAL 60 tablet 0  . JANUVIA 100 MG tablet TAKE 1  TABLET BY MOUTH ONCE A DAY 30 tablet 0  . lamoTRIgine (LAMICTAL) 100 MG tablet TAKE 1 TABLET BY MOUTH EVERY MORNING 30 tablet 0  . lisinopril-hydrochlorothiazide (ZESTORETIC) 20-12.5 MG tablet Take 2 tablets by mouth daily. 180 tablet 3  . MEGARED OMEGA-3 KRILL OIL PO Take 750 mg by mouth daily.    . Misc Natural Products (NF FORMULAS TESTOSTERONE) CAPS Take 1 capsule by mouth 2 (two) times daily.    . pantoprazole (PROTONIX) 40 MG tablet Take 1 tablet (40 mg total) by mouth daily. 30 tablet 0  . potassium chloride (KLOR-CON) 10 MEQ tablet TAKE 1 TABLET BY MOUTH ONCE DAILY 30 tablet 3  . Syringe/Needle, Disp, (SYRINGE 3CC/20GX1") 20G  X 1" 3 ML MISC 1 Dose by Does not apply route every 14 (fourteen) days. 12 each 0  . testosterone cypionate (DEPOTESTOSTERONE CYPIONATE) 200 MG/ML injection INJECT 1 ML (200 MG) INTO THE MUSCLE EVERY 14 DAYS 2 mL 0  . tizanidine (ZANAFLEX) 6 MG capsule Take 1 capsule (6 mg total) by mouth 3 (three) times daily as needed for muscle spasms. 20 capsule 0  . predniSONE (DELTASONE) 10 MG tablet Take 40 mg by mouth daily.     No current facility-administered medications for this visit.    Allergies  Allergen Reactions  . Penicillins     REACTION: unspecified    Family History  Problem Relation Age of Onset  . Cancer Mother        lymphoma  . Heart disease Father   . Hypertension Father   . Diabetes Sister   . Stroke Paternal Uncle   . Diabetes Maternal Grandmother   . Arthritis Paternal Grandmother   . Heart disease Paternal Grandfather   . Migraines Daughter   . Irritable bowel syndrome Daughter   . Heart Problems Son        valve     Social History   Socioeconomic History  . Marital status: Married    Spouse name: Not on file  . Number of children: Not on file  . Years of education: Not on file  . Highest education level: Not on file  Occupational History  . Not on file  Tobacco Use  . Smoking status: Never Smoker  . Smokeless tobacco: Current User    Types: Chew  . Tobacco comment: has been using chew 40 years  Vaping Use  . Vaping Use: Never used  Substance and Sexual Activity  . Alcohol use: Yes    Comment: rare  . Drug use: Never  . Sexual activity: Yes  Other Topics Concern  . Not on file  Social History Narrative  . Not on file   Social Determinants of Health   Financial Resource Strain:   . Difficulty of Paying Living Expenses: Not on file  Food Insecurity:   . Worried About Programme researcher, broadcasting/film/videounning Out of Food in the Last Year: Not on file  . Ran Out of Food in the Last Year: Not on file  Transportation Needs:   . Lack of Transportation (Medical): Not on file  .  Lack of Transportation (Non-Medical): Not on file  Physical Activity:   . Days of Exercise per Week: Not on file  . Minutes of Exercise per Session: Not on file  Stress:   . Feeling of Stress : Not on file  Social Connections:   . Frequency of Communication with Friends and Family: Not on file  . Frequency of Social Gatherings with Friends and Family: Not on file  .  Attends Religious Services: Not on file  . Active Member of Clubs or Organizations: Not on file  . Attends Banker Meetings: Not on file  . Marital Status: Not on file  Intimate Partner Violence:   . Fear of Current or Ex-Partner: Not on file  . Emotionally Abused: Not on file  . Physically Abused: Not on file  . Sexually Abused: Not on file     Constitutional: Pt reports frequent headaches, fatigue. Denies fever, malaise, or abrupt weight changes.  HEENT: Pt reports ear fullness. Denies eye pain, eye redness, ear pain, ringing in the ears, wax buildup, runny nose, nasal congestion, bloody nose, or sore throat. Respiratory: Denies difficulty breathing, shortness of breath, cough or sputum production.   Cardiovascular: Denies chest pain, chest tightness, palpitations or swelling in the hands or feet.  Gastrointestinal: Pt reports reflux. Denies abdominal pain, bloating, constipation, diarrhea or blood in the stool.  GU: Pt reports urinary hesitancy and urgency. Denies frequency, pain with urination, burning sensation, blood in urine, odor or discharge. Musculoskeletal: Pt reports chronic joint pain. Denies decrease in range of motion, difficulty with gait, muscle pain or joint swelling.  Skin: Denies redness, rashes, lesions or ulcercations.  Neurological: Denies dizziness, difficulty with memory, difficulty with speech or problems with balance and coordination.  Psych: Pt has a history of depression. Denies anxiety, SI/HI.  No other specific complaints in a complete review of systems (except as listed in HPI  above).  Objective:   Physical Exam  BP 140/76   Pulse 79   Temp 98.3 F (36.8 C) (Temporal)   Ht 6' (1.829 m)   Wt (!) 352 lb (159.7 kg)   SpO2 97%   BMI 47.74 kg/m  Wt Readings from Last 3 Encounters:  01/23/20 (!) 352 lb (159.7 kg)  01/07/20 (!) 354 lb 8 oz (160.8 kg)  08/01/19 (!) 352 lb (159.7 kg)    General: Appears his stated age, obese, in NAD. Skin: Skin tears noted to bilateral hands. HEENT: Head: normal shape and size; Eyes: sclera white, no icterus, conjunctiva pink, PERRLA and EOMs intact;  Neck:  Neck supple, trachea midline. No masses, lumps or thyromegaly present.  Cardiovascular: Normal rate and rhythm. S1,S2 noted.  No murmur, rubs or gallops noted. No JVD or BLE edema. No carotid bruits noted. Pulmonary/Chest: Normal effort and positive vesicular breath sounds. No respiratory distress. No wheezes, rales or ronchi noted.  Abdomen: Soft and nontender. Normal bowel sounds. Liver, spleen and kidneys non palpable. Musculoskeletal: Strength 5/5 BUE/BLE. No difficulty with gait.  Neurological: Alert and oriented. Cranial nerves II-XII grossly intact. Coordination normal.  Psychiatric: Mood and affect flat. Behavior is normal. Judgment and thought content normal.     BMET    Component Value Date/Time   NA 136 09/12/2018 0911   K 4.1 09/12/2018 0911   CL 100 09/12/2018 0911   CO2 27 09/12/2018 0911   GLUCOSE 161 (H) 09/12/2018 0911   BUN 14 09/12/2018 0911   CREATININE 1.04 09/12/2018 0911   CALCIUM 8.9 09/12/2018 0911   GFRNONAA >90 05/15/2013 0922   GFRAA >90 05/15/2013 0922    Lipid Panel     Component Value Date/Time   CHOL 102 09/12/2018 0911   TRIG 172.0 (H) 09/12/2018 0911   HDL 29.20 (L) 09/12/2018 0911   CHOLHDL 3 09/12/2018 0911   VLDL 34.4 09/12/2018 0911   LDLCALC 38 09/12/2018 0911    CBC    Component Value Date/Time   WBC 5.3 09/12/2018  0911   RBC 5.04 09/12/2018 0911   HGB 13.2 09/12/2018 0911   HCT 40.3 09/12/2018 0911   PLT  206.0 09/12/2018 0911   MCV 80.0 09/12/2018 0911   MCHC 32.8 09/12/2018 0911   RDW 15.3 09/12/2018 0911    Hgb A1C Lab Results  Component Value Date   HGBA1C 8.7 (H) 09/12/2018            Assessment & Plan:   Preventative Health Maintenance:  Flu shot due but does not want to get today as he is on Prednisone Tetanus UTD Pneumovax UTD Covid UTD Shingrix UTD Referral to GI for screening colonoscopy Encouraged him to consume a balanced diet and exercise regimen Advised him to see an eye doctor and dentist annually Will check CBC, CMET, Lipid, A1C and PSA today  RTC in 3 months, follow up DM 2 Nicki Reaper, NP This visit occurred during the SARS-CoV-2 public health emergency.  Safety protocols were in place, including screening questions prior to the visit, additional usage of staff PPE, and extensive cleaning of exam room while observing appropriate contact time as indicated for disinfecting solutions.

## 2020-01-31 ENCOUNTER — Telehealth: Payer: 59

## 2020-01-31 ENCOUNTER — Other Ambulatory Visit: Payer: Self-pay | Admitting: Internal Medicine

## 2020-01-31 DIAGNOSIS — M2550 Pain in unspecified joint: Secondary | ICD-10-CM

## 2020-01-31 DIAGNOSIS — M791 Myalgia, unspecified site: Secondary | ICD-10-CM

## 2020-02-01 NOTE — Telephone Encounter (Signed)
Lamictal last filled 12/26/2019...Marland Kitchen please advise

## 2020-02-01 NOTE — Addendum Note (Signed)
Addended by: Lorre Munroe on: 02/01/2020 12:57 PM   Modules accepted: Orders

## 2020-02-04 ENCOUNTER — Other Ambulatory Visit: Payer: Self-pay | Admitting: Internal Medicine

## 2020-02-06 ENCOUNTER — Other Ambulatory Visit: Payer: Self-pay | Admitting: Internal Medicine

## 2020-03-10 ENCOUNTER — Other Ambulatory Visit: Payer: Self-pay | Admitting: Internal Medicine

## 2020-03-10 LAB — HM DIABETES EYE EXAM

## 2020-03-12 DIAGNOSIS — M2040 Other hammer toe(s) (acquired), unspecified foot: Secondary | ICD-10-CM | POA: Insufficient documentation

## 2020-03-12 DIAGNOSIS — M79672 Pain in left foot: Secondary | ICD-10-CM | POA: Insufficient documentation

## 2020-03-12 DIAGNOSIS — M7742 Metatarsalgia, left foot: Secondary | ICD-10-CM | POA: Insufficient documentation

## 2020-04-05 NOTE — Progress Notes (Unsigned)
Cardiology Office Note  Date:  04/08/2020   ID:  Anthony Castillo, DOB 1965/03/07, MRN [id number]  PCP:  Lorre Munroe, NP   Chief Complaint  Patient presents with  . OTher    12 month follow up. Patient c/o SOB and some dizziness when straining. Meds reviewed verbally with patient.     HPI:  [name] is a 55 year old gentleman with history of Morbid obesity Chronic shortness of breath Hyperlipidemia Hypertension Strong family history of coronary artery disease Sleep apnea Osteoarthritis Who presents for follow-up of his chest pain, shortness of breath, leg swelling Calcium score 103 in February 2020  Last seen by one of our providers February 2021  CT coronary calcium scoring discussed, score 103 Fatty liver also noted Would recommend he start low-dose statin  Currently tolerating Lipitor 20 mg daily  Lab work reviewed hemoglobin A1c of 9, trending up from 7 approximately 2 years ago  Labs reviewed Total cholesterol 112, LDL 46  EKG personally reviewed by myself on todays visit NSR rate 84 bpm, no ST or T wave changes  Reports he has periodic left-sided chest pain Does not seem to coincide with exertion but comes and goes Sometimes hurts when he takes a breath in Has not noticed a correlation with exertion or exercise He is concerned given strong family history of coronary disease  Also reports having chronic shortness of breath for over the past year or more No regular exercise program, weight has been trending upwards 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 Again concerned this could be a sign of a blockage  Troubled by lower extremity edema Worse after he has been sitting for long periods of time or standing Concerned this could be secondary to fluid overload  Recently stopped his Crestor 10 mg daily Thought it might be contributing to diffuse joint pain Thinks he may feel better off the  Crestor  EKG personally reviewed by myself on todays visit Shows normal sinus rhythm with rate 78 bpm T wave abnormality V3 through V6 T wave abnormality dating back to 2009   PMH:   has a past medical history of Chronic pain (04/09/2011), DEPRESSION (03/16/2007), GERD (gastroesophageal reflux disease), HYPERTENSION (09/22/2006), Hypogonadism male (04/09/2011), Morbid obesity (HCC) (09/22/2006), Osteoarthritis (04/09/2011), Sleep apnea, and Wears glasses.  PSH:    Past Surgical History:  Procedure Laterality Date  . CARPAL TUNNEL RELEASE Right 05/17/2013   Procedure: RIGHT CARPAL TUNNEL RELEASE;  Surgeon: Wyn Forster., MD;  Location: [facility];  Service: Orthopedics;  Laterality: Right;  . CARPAL TUNNEL RELEASE Left 07/19/2013   Procedure: LEFT CARPAL TUNNEL RELEASE;  Surgeon: Wyn Forster., MD;  Location: [facility];  Service: Orthopedics;  Laterality: Left;  . FOOT SURGERY Right 01/2015  . KNEE ARTHROSCOPY  6/14   right  . TONSILLECTOMY    . ULNAR NERVE TRANSPOSITION Right 05/17/2013   Procedure: RIGHT ULNAR NERVE DECOMPRESSION;  Surgeon: Wyn Forster., MD;  Location: [facility];  Service: Orthopedics;  Laterality: Right;  . WISDOM TOOTH EXTRACTION      Current Outpatient Medications  Medication Sig Dispense Refill  . Acetaminophen (TYLENOL ARTHRITIS PAIN PO) Take by mouth as needed.    Marland Kitchen amLODipine (NORVASC) 10 MG tablet TAKE 1 TABLET BY MOUTH ONCE A DAY 90 tablet 0  . atorvastatin (LIPITOR) 20 MG tablet Take 1 tablet (20 mg total) by mouth  daily. 90 tablet 3  . augmented betamethasone dipropionate (DIPROLENE-AF) 0.05 % ointment Apply topically.    Marland Kitchen buPROPion (WELLBUTRIN SR) 150 MG 12 hr tablet   0  . celecoxib (CELEBREX) 100 MG capsule TAKE 1 CAPSULE BY MOUTH TWICE DAILY 180 capsule 1  . cetirizine (ZYRTEC) 10 MG tablet Take 10 mg by mouth daily.    . Cholecalciferol (VITAMIN D3) 1000 units CAPS Take 1 capsule by mouth daily.     . empagliflozin (JARDIANCE) 25 MG TABS tablet Take by mouth.    . ezetimibe (ZETIA) 10 MG tablet Take 1 tablet (10 mg total) by mouth daily. 90 tablet 3  . fluocinonide cream (LIDEX) 0.05 % as needed.   1  . FLUoxetine (PROZAC) 40 MG capsule TAKE 1 CAPSULE BY MOUTH ONCE DAILY 90 capsule 1  . fluticasone (FLONASE) 50 MCG/ACT nasal spray Place 2 sprays into both nostrils as needed.     Marland Kitchen glipiZIDE (GLUCOTROL) 10 MG tablet TAKE 1 TABLET BY MOUTH TWICE A DAY BEFORE A MEAL 180 tablet 0  . JANUVIA 100 MG tablet TAKE 1 TABLET BY MOUTH ONCE A DAY 30 tablet 0  . lamoTRIgine (LAMICTAL) 100 MG tablet TAKE 1 TABLET BY MOUTH EVERY MORNING 90 tablet 1  . lisinopril-hydrochlorothiazide (ZESTORETIC) 20-12.5 MG tablet Take 2 tablets by mouth daily. 180 tablet 3  . Misc Natural Products (NF FORMULAS TESTOSTERONE) CAPS Take 1 capsule by mouth 2 (two) times daily.    . pantoprazole (PROTONIX) 40 MG tablet Take 1 tablet (40 mg total) by mouth daily. 90 tablet 0  . potassium chloride (KLOR-CON) 10 MEQ tablet TAKE 1 TABLET BY MOUTH ONCE DAILY 30 tablet 3  . predniSONE (DELTASONE) 10 MG tablet Take 40 mg by mouth daily.    . Syringe/Needle, Disp, (SYRINGE 3CC/20GX1") 20G X 1" 3 ML MISC 1 Dose by Does not apply route every 14 (fourteen) days. 12 each 0  . tamsulosin (FLOMAX) 0.4 MG CAPS capsule Take 1 capsule (0.4 mg total) by mouth daily. 30 capsule 3  . testosterone cypionate (DEPOTESTOSTERONE CYPIONATE) 200 MG/ML injection INJECT 1 ML (200 MG) INTO THE MUSCLE EVERY 14 DAYS 2 mL 0  . tizanidine (ZANAFLEX) 6 MG capsule Take 1 capsule (6 mg total) by mouth 3 (three) times daily as needed for muscle spasms. 20 capsule 0   No current facility-administered medications for this visit.     Allergies:   Penicillins   Social History:  The patient  reports that he has never smoked. His smokeless tobacco use includes chew. He reports current alcohol use. He reports that he does not use drugs.   Family History:   family  history includes Arthritis in his paternal grandmother; Cancer in his mother; Diabetes in his maternal grandmother and sister; Heart Problems in his son; Heart disease in his father and paternal grandfather; Hypertension in his father; Irritable bowel syndrome in his daughter; Migraines in his daughter; Stroke in his paternal uncle.    Review of Systems: Review of Systems  Constitutional: Negative.   HENT: Negative.   Respiratory: Positive for shortness of breath.   Cardiovascular: Positive for leg swelling.  Gastrointestinal: Negative.   Musculoskeletal: Negative.   Neurological: Negative.   Psychiatric/Behavioral: Negative.   All other systems reviewed and are negative.   PHYSICAL EXAM: VS:  BP 116/60 (BP Location: Left Arm, Patient Position: Sitting, Cuff Size: Large)   Pulse 84   Ht 6' (1.829 m)   Wt (!) 355 lb (161 kg)   SpO2  95%   BMI 48.15 kg/m  , BMI Body mass index is 48.15 kg/m. GEN: Well nourished, well developed, in no acute distress  HEENT: normal  Neck: no JVD, carotid bruits, or masses Cardiac: RRR; no murmurs, rubs, or gallops,no edema  Respiratory:  clear to auscultation bilaterally, normal work of breathing GI: soft, nontender, nondistended, + BS MS: no deformity or atrophy  Skin: warm and dry, no rash Neuro:  Strength and sensation are intact Psych: euthymic mood, full affect  Recent Labs: 01/23/2020: ALT 35; BUN 17; Creatinine, Ser 1.20; Hemoglobin 13.6; Platelets 253.0; Potassium 4.5; Sodium 129    Lipid Panel Lab Results  Component Value Date   CHOL 112 01/23/2020   HDL 37.30 (L) 01/23/2020   LDLCALC 46 01/23/2020   TRIG 140.0 01/23/2020      Wt Readings from Last 3 Encounters:  04/08/20 (!) 355 lb (161 kg)  01/23/20 (!) 352 lb (159.7 kg)  01/07/20 (!) 354 lb 8 oz (160.8 kg)      ASSESSMENT AND PLAN:  Type 2 diabetes poor control We have encouraged continued exercise, careful diet management in an effort to lose weight.  Chest pain  with moderate risk for cardiac etiology -  Prior discussion, felt to be atypical in nature   Lower extremity edema Lower extremity edema likely venous issue, no pitting edema Exacerbated by weight Will avoid calcium channel blockers, wear compression hose  shortness of breath -  Deconditioning, morbid obesity likely contributing Weight continues to run high, recommended regular walking program  Hypertension Blood pressure is well controlled on today's visit. No changes made to the medications.   Total encounter time more than 25 minutes  Greater than 50% was spent in counseling and coordination of care with the patient   Orders Placed This Encounter  Procedures  . EKG 12-Lead     Signed, Dossie Arbour, M.D., Ph.D. 04/08/2020  [facility] [place], [place] [phone]

## 2020-04-07 ENCOUNTER — Other Ambulatory Visit: Payer: Self-pay | Admitting: Physician Assistant

## 2020-04-07 DIAGNOSIS — M25511 Pain in right shoulder: Secondary | ICD-10-CM

## 2020-04-08 ENCOUNTER — Other Ambulatory Visit: Payer: Self-pay

## 2020-04-08 ENCOUNTER — Encounter: Payer: Self-pay | Admitting: Cardiovascular Disease

## 2020-04-08 ENCOUNTER — Ambulatory Visit (INDEPENDENT_AMBULATORY_CARE_PROVIDER_SITE_OTHER): Payer: 59 | Admitting: Cardiovascular Disease

## 2020-04-08 VITALS — BP 116/60 | HR 84 | Ht 72.0 in | Wt 355.0 lb

## 2020-04-08 DIAGNOSIS — I10 Essential (primary) hypertension: Secondary | ICD-10-CM | POA: Diagnosis not present

## 2020-04-08 DIAGNOSIS — E1165 Type 2 diabetes mellitus with hyperglycemia: Secondary | ICD-10-CM | POA: Diagnosis not present

## 2020-04-08 DIAGNOSIS — E782 Mixed hyperlipidemia: Secondary | ICD-10-CM | POA: Diagnosis not present

## 2020-04-08 DIAGNOSIS — R600 Localized edema: Secondary | ICD-10-CM

## 2020-04-08 NOTE — Patient Instructions (Addendum)
Medication Instructions:  Please continue your current medications, there are no changes at this. If you need any refills on your cardiac medications, please reach out to your pharmacy, they will send in request.   Lab work: No new labs needed   Testing/Procedures: No new testing needed   Follow-Up:  . You will need a follow up appointment in 12 months  . Providers on your designated Care Team:   . Nicolasa Ducking, NP . Eula Listen, PA-C . Marisue Ivan, PA-C

## 2020-04-09 ENCOUNTER — Other Ambulatory Visit: Payer: Self-pay | Admitting: Internal Medicine

## 2020-04-09 ENCOUNTER — Other Ambulatory Visit: Payer: Self-pay | Admitting: Family

## 2020-04-09 DIAGNOSIS — E782 Mixed hyperlipidemia: Secondary | ICD-10-CM

## 2020-04-09 DIAGNOSIS — Z8249 Family history of ischemic heart disease and other diseases of the circulatory system: Secondary | ICD-10-CM

## 2020-04-11 ENCOUNTER — Ambulatory Visit: Payer: 59 | Admitting: *Deleted

## 2020-04-27 ENCOUNTER — Ambulatory Visit
Admission: RE | Admit: 2020-04-27 | Discharge: 2020-04-27 | Disposition: A | Discharge status: Home / Self Care | Payer: 59 | Source: Ambulatory Visit | Attending: Physician Assistant | Admitting: Physician Assistant

## 2020-04-27 ENCOUNTER — Other Ambulatory Visit: Payer: Self-pay

## 2020-04-27 DIAGNOSIS — M25511 Pain in right shoulder: Secondary | ICD-10-CM

## 2020-04-30 ENCOUNTER — Other Ambulatory Visit: Payer: Self-pay | Admitting: Internal Medicine

## 2020-05-02 ENCOUNTER — Other Ambulatory Visit: Payer: Self-pay | Admitting: Family

## 2020-05-02 DIAGNOSIS — E782 Mixed hyperlipidemia: Secondary | ICD-10-CM

## 2020-05-02 DIAGNOSIS — I10 Essential (primary) hypertension: Secondary | ICD-10-CM

## 2020-05-02 DIAGNOSIS — Z8249 Family history of ischemic heart disease and other diseases of the circulatory system: Secondary | ICD-10-CM

## 2020-05-16 ENCOUNTER — Other Ambulatory Visit: Payer: Self-pay | Admitting: Family

## 2020-05-16 ENCOUNTER — Other Ambulatory Visit: Payer: Self-pay | Admitting: Internal Medicine

## 2020-05-16 DIAGNOSIS — I10 Essential (primary) hypertension: Secondary | ICD-10-CM

## 2020-05-19 ENCOUNTER — Other Ambulatory Visit: Payer: Self-pay | Admitting: Internal Medicine

## 2020-06-02 ENCOUNTER — Other Ambulatory Visit: Payer: Self-pay | Admitting: Internal Medicine

## 2020-07-21 ENCOUNTER — Telehealth: Payer: Self-pay | Admitting: Internal Medicine

## 2020-07-21 ENCOUNTER — Other Ambulatory Visit: Payer: Self-pay | Admitting: Internal Medicine

## 2020-07-21 NOTE — Telephone Encounter (Signed)
Medication Refill - Medication: JANUVIA 100 MG tablet  Pt's wife declined appt, wants a call back if appt is necessary. Please advise   Has the patient contacted their pharmacy? No. (Agent: If no, request that the patient contact the pharmacy for the refill.) (Agent: If yes, when and what did the pharmacy advise?)  Preferred Pharmacy (with phone number or street name):  [facility] - Adline Peals,  - [address]  [address] [place] [address]  Phone: [phone] Fax: [phone]     Agent: Please be advised that RX refills may take up to 3 business days. We ask that you follow-up with your pharmacy.

## 2020-07-21 NOTE — Telephone Encounter (Signed)
Requested medications are due for refill today.  yes  Requested medications are on the active medications list.  yes  Last refill. 06/16/2020  Future visit scheduled.   no  Notes to clinic.

## 2020-07-22 MED ORDER — SITAGLIPTIN PHOSPHATE 100 MG PO TABS: 100.0000 mg | ORAL_TABLET | Freq: Every day | ORAL | 0 refills | Status: DC

## 2020-07-22 NOTE — Telephone Encounter (Signed)
The pt was notified and appt scheduled for Tuesday, June 7th at 9:40am.

## 2020-07-22 NOTE — Telephone Encounter (Signed)
Will refill x 30 days, but yes he needs an appt

## 2020-07-22 NOTE — Addendum Note (Signed)
Addended by: Lorre Munroe on: 07/22/2020 10:22 AM   Modules accepted: Orders

## 2020-07-29 ENCOUNTER — Other Ambulatory Visit: Payer: Self-pay | Admitting: Internal Medicine

## 2020-08-05 ENCOUNTER — Other Ambulatory Visit: Payer: Self-pay

## 2020-08-05 ENCOUNTER — Ambulatory Visit: Payer: Self-pay | Admitting: Internal Medicine

## 2020-08-05 ENCOUNTER — Encounter: Payer: Self-pay | Admitting: Internal Medicine

## 2020-08-05 VITALS — BP 124/65 | HR 77 | Temp 97.7°F | Resp 18 | Ht 72.0 in | Wt 349.5 lb

## 2020-08-05 DIAGNOSIS — F324 Major depressive disorder, single episode, in partial remission: Secondary | ICD-10-CM | POA: Diagnosis not present

## 2020-08-05 DIAGNOSIS — K219 Gastro-esophageal reflux disease without esophagitis: Secondary | ICD-10-CM

## 2020-08-05 DIAGNOSIS — I10 Essential (primary) hypertension: Secondary | ICD-10-CM | POA: Diagnosis not present

## 2020-08-05 DIAGNOSIS — E119 Type 2 diabetes mellitus without complications: Secondary | ICD-10-CM | POA: Diagnosis not present

## 2020-08-05 DIAGNOSIS — M159 Polyosteoarthritis, unspecified: Secondary | ICD-10-CM

## 2020-08-05 DIAGNOSIS — N401 Enlarged prostate with lower urinary tract symptoms: Secondary | ICD-10-CM

## 2020-08-05 DIAGNOSIS — R3911 Hesitancy of micturition: Secondary | ICD-10-CM

## 2020-08-05 DIAGNOSIS — M8949 Other hypertrophic osteoarthropathy, multiple sites: Secondary | ICD-10-CM

## 2020-08-05 DIAGNOSIS — E291 Testicular hypofunction: Secondary | ICD-10-CM

## 2020-08-05 DIAGNOSIS — E7800 Pure hypercholesterolemia, unspecified: Secondary | ICD-10-CM

## 2020-08-05 DIAGNOSIS — G4733 Obstructive sleep apnea (adult) (pediatric): Secondary | ICD-10-CM

## 2020-08-05 NOTE — Progress Notes (Signed)
Subjective:    Patient ID: Anthony Castillo, male    DOB: [birth year], 55 y.o.   MRN: [id number]  HPI  Patient presents the clinic today for follow-up of chronic conditions.  Depression: Chronic dysthymia, managed on Lamotrigine, Fluoxetine and Wellbutrin.  He is not seeing a therapist.  He denies anxiety, SI/HI.  DM 2: His last A1c was 9%, 12/2019.  His sugars range 120-170.  He is taking Glipizide, Januvia and Gabapentin as prescribed.  He does not check his feet routinely.  His last eye exam was > 1 year ago.  He has seen an endocrinologist.  Flu 11/2018.  Pneumovax: 07/2016. Covid Moderna x2.  HTN: His BP today is 124/65.  He is taking Lisinopril HCT, Potassium and Amlodipine as prescribed.  ECG from 04/2019 reviewed.  BPH: Managed on Flomax. He is not currently seeing urology.  GERD: He denies breakthrough on Pantoprazole.  There is no upper GI on file.  Hypogonadism: He is not currently taking Testosterone injections.  He is not currently following with urology.  OA: Generalized.  He is taking Celebrex, Zanaflex and Gabapentin with some relief of symptoms.  He takes Tylenol arthritis as needed for breakthrough.  He is not following with orthopedics or rheumatology at this time.  HLD: His last LDL was, triglycerides.  He denies myalgias on Atorvastatin, Ezetimibe and Fish Oil.  He does not consume a low-fat diet  OSA: He is not wearing his CPAP.  Sleep study from 2005 reviewed.  Review of Systems      Past Medical History:  Diagnosis Date  . Chronic pain 04/09/2011  . DEPRESSION 03/16/2007  . GERD (gastroesophageal reflux disease)   . HYPERTENSION 09/22/2006  . Hypogonadism male 04/09/2011  . Morbid obesity (HCC) 09/22/2006  . Osteoarthritis 04/09/2011  . Sleep apnea    uses a cpap-will use after surgery  . Wears glasses     Current Outpatient Medications  Medication Sig Dispense Refill  . Acetaminophen (TYLENOL ARTHRITIS PAIN PO) Take by mouth as needed.    Marland Kitchen amLODipine  (NORVASC) 10 MG tablet TAKE 1 TABLET BY MOUTH ONCE A DAY 90 tablet 0  . atorvastatin (LIPITOR) 20 MG tablet TAKE 1 TABLET BY MOUTH ONCE A DAY 90 tablet 3  . augmented betamethasone dipropionate (DIPROLENE-AF) 0.05 % ointment Apply topically.    Marland Kitchen buPROPion (WELLBUTRIN SR) 150 MG 12 hr tablet   0  . celecoxib (CELEBREX) 100 MG capsule TAKE 1 CAPSULE BY MOUTH TWICE DAILY 180 capsule 1  . cetirizine (ZYRTEC) 10 MG tablet Take 10 mg by mouth daily.    . Cholecalciferol (VITAMIN D3) 1000 units CAPS Take 1 capsule by mouth daily.    . empagliflozin (JARDIANCE) 25 MG TABS tablet Take by mouth.    . ezetimibe (ZETIA) 10 MG tablet TAKE 1 TABLET BY MOUTH ONCE A DAY 90 tablet 3  . fluocinonide cream (LIDEX) 0.05 % as needed.   1  . FLUoxetine (PROZAC) 40 MG capsule TAKE 1 CAPSULE BY MOUTH ONCE DAILY 90 capsule 1  . fluticasone (FLONASE) 50 MCG/ACT nasal spray Place 2 sprays into both nostrils as needed.     Marland Kitchen glipiZIDE (GLUCOTROL) 10 MG tablet TAKE 1 TABLET BY MOUTH TWICE A DAY BEFORE A MEAL. 180 tablet 0  . JANUVIA 100 MG tablet TAKE 1 TABLET BY MOUTH ONCE A DAY 90 tablet 0  . lamoTRIgine (LAMICTAL) 100 MG tablet TAKE 1 TABLET BY MOUTH EVERY MORNING 90 tablet 1  . lisinopril-hydrochlorothiazide (ZESTORETIC) 20-12.5 MG  tablet TAKE 2 TABLETS BY MOUTH ONCE A DAY 180 tablet 3  . Misc Natural Products (NF FORMULAS TESTOSTERONE) CAPS Take 1 capsule by mouth 2 (two) times daily.    . pantoprazole (PROTONIX) 40 MG tablet TAKE 1 TABLET BY MOUTH ONCE A DAY 90 tablet 0  . potassium chloride (KLOR-CON) 10 MEQ tablet TAKE 1 TABLET BY MOUTH ONCE A DAY 90 tablet 3  . predniSONE (DELTASONE) 10 MG tablet Take 40 mg by mouth daily.    . sitaGLIPtin (JANUVIA) 100 MG tablet Take 1 tablet (100 mg total) by mouth daily. 30 tablet 0  . Syringe/Needle, Disp, (SYRINGE 3CC/20GX1") 20G X 1" 3 ML MISC 1 Dose by Does not apply route every 14 (fourteen) days. 12 each 0  . tamsulosin (FLOMAX) 0.4 MG CAPS capsule TAKE 1 CAPSULE BY  MOUTH ONCE DAILY 30 capsule 3  . testosterone cypionate (DEPOTESTOSTERONE CYPIONATE) 200 MG/ML injection INJECT 1 ML (200 MG) INTO THE MUSCLE EVERY 14 DAYS 2 mL 0  . tizanidine (ZANAFLEX) 6 MG capsule Take 1 capsule (6 mg total) by mouth 3 (three) times daily as needed for muscle spasms. 20 capsule 0   No current facility-administered medications for this visit.    Allergies  Allergen Reactions  . Penicillins     REACTION: unspecified    Family History  Problem Relation Age of Onset  . Cancer Mother        lymphoma  . Heart disease Father   . Hypertension Father   . Diabetes Sister   . Stroke Paternal Uncle   . Diabetes Maternal Grandmother   . Arthritis Paternal Grandmother   . Heart disease Paternal Grandfather   . Migraines Daughter   . Irritable bowel syndrome Daughter   . Heart Problems Son        valve     Social History   Socioeconomic History  . Marital status: Married    Spouse name: Not on file  . Number of children: Not on file  . Years of education: Not on file  . Highest education level: Not on file  Occupational History  . Not on file  Tobacco Use  . Smoking status: Never Smoker  . Smokeless tobacco: Current User    Types: Chew  . Tobacco comment: has been using chew 40 years  Vaping Use  . Vaping Use: Never used  Substance and Sexual Activity  . Alcohol use: Yes    Comment: rare  . Drug use: Never  . Sexual activity: Yes  Other Topics Concern  . Not on file  Social History Narrative  . Not on file   Social Determinants of Health   Financial Resource Strain: Not on file  Food Insecurity: Not on file  Transportation Needs: Not on file  Physical Activity: Not on file  Stress: Not on file  Social Connections: Not on file  Intimate Partner Violence: Not on file     Constitutional: Pt reports chronic fatigue. Denies fever, malaise, headache or abrupt weight changes.  HEENT: Denies eye pain, eye redness, ear pain, ringing in the ears, wax  buildup, runny nose, nasal congestion, bloody nose, or sore throat. Respiratory: Denies difficulty breathing, shortness of breath, cough or sputum production.   Cardiovascular: Denies chest pain, chest tightness, palpitations or swelling in the hands or feet.  Gastrointestinal: Denies abdominal pain, bloating, constipation, diarrhea or blood in the stool.  GU: Denies urgency, frequency, pain with urination, burning sensation, blood in urine, odor or discharge. Musculoskeletal: Pt reports  chronic joint pain. Denies decrease in range of motion, difficulty with gait, muscle pain or joint swelling.  Skin: Denies redness, rashes, lesions or ulcercations.  Neurological: Denies dizziness, difficulty with memory, difficulty with speech or problems with balance and coordination.  Psych: Pt has a history of depression. Denies anxiety, SI/HI.  No other specific complaints in a complete review of systems (except as listed in HPI above).  Objective:   Physical Exam   BP 124/65 (BP Location: Right Arm, Patient Position: Sitting, Cuff Size: Large)   Pulse 77   Temp 97.7 F (36.5 C) (Temporal)   Resp 18   Ht 6' (1.829 m)   Wt (!) 349 lb 8 oz (158.5 kg)   SpO2 98%   BMI 47.40 kg/m   Wt Readings from Last 3 Encounters:  04/08/20 (!) 355 lb (161 kg)  01/23/20 (!) 352 lb (159.7 kg)  01/07/20 (!) 354 lb 8 oz (160.8 kg)    General: Appears his stated age, obese, in NAD. Skin:  Sun damaged skin. Abrasion noted to left shin. No ulcerations noted. HEENT: Head: normal shape and size; Eyes: sclera white and EOMs intact;  Cardiovascular: Normal rate and rhythm. S1,S2 noted.  No murmur, rubs or gallops noted.Trace pitting BLE edema. No carotid bruits noted. Pulmonary/Chest: Normal effort and positive vesicular breath sounds. No respiratory distress. No wheezes, rales or ronchi noted.  Abdomen:  Normal bowel sounds.  Musculoskeletal:  No difficulty with gait.  Neurological: Alert and oriented.   Psychiatric: Mood and affect mildly flat. Behavior is normal. Judgment and thought content normal.   BMET    Component Value Date/Time   NA 129 (L) 01/23/2020 1413   K 4.5 01/23/2020 1413   CL 94 (L) 01/23/2020 1413   CO2 25 01/23/2020 1413   GLUCOSE 426 (H) 01/23/2020 1413   BUN 17 01/23/2020 1413   CREATININE 1.20 01/23/2020 1413   CALCIUM 9.7 01/23/2020 1413   GFRNONAA >90 05/15/2013 0922   GFRAA >90 05/15/2013 0922    Lipid Panel     Component Value Date/Time   CHOL 112 01/23/2020 1413   TRIG 140.0 01/23/2020 1413   HDL 37.30 (L) 01/23/2020 1413   CHOLHDL 3 01/23/2020 1413   VLDL 28.0 01/23/2020 1413   LDLCALC 46 01/23/2020 1413    CBC    Component Value Date/Time   WBC 8.1 01/23/2020 1413   RBC 5.00 01/23/2020 1413   HGB 13.6 01/23/2020 1413   HCT 40.5 01/23/2020 1413   PLT 253.0 01/23/2020 1413   MCV 81.1 01/23/2020 1413   MCHC 33.6 01/23/2020 1413   RDW 14.3 01/23/2020 1413    Hgb A1C Lab Results  Component Value Date   HGBA1C 9.0 (H) 01/23/2020           Assessment & Plan:    Anthony Reaper, NP This visit occurred during the SARS-CoV-2 public health emergency.  Safety protocols were in place, including screening questions prior to the visit, additional usage of staff PPE, and extensive cleaning of exam room while observing appropriate contact time as indicated for disinfecting solutions.

## 2020-08-05 NOTE — Assessment & Plan Note (Signed)
Encouraged weight loss as this can help reduce reflux symptoms Continue Pantoprazole 

## 2020-08-05 NOTE — Assessment & Plan Note (Signed)
Not wearing CPAP Encourage weight loss as this can help reduce sleep apnea symptoms 

## 2020-08-05 NOTE — Patient Instructions (Signed)

## 2020-08-05 NOTE — Assessment & Plan Note (Signed)
Continue Flomax Will monitor 

## 2020-08-05 NOTE — Assessment & Plan Note (Signed)
Continue Lamotrigine, Fluoxetine and Wellbutrin C-Met today Support offered

## 2020-08-05 NOTE — Assessment & Plan Note (Signed)
A1c today No urine microalbumin secondary to ACEI therapy Encouraged him to consume a low-carb diet and exercise for weight loss Continue Glipizide, Januvia and Gabapentin Encourage routine eye exams Encourage routine foot exams Encouraged him to get a flu shot in the fall Pneumovax UTD Encouraged him to get his COVID booster

## 2020-08-05 NOTE — Assessment & Plan Note (Signed)
Encourage regular physical activity Encourage weight loss as this can help reduce joint pain Continue Celebrex, Zanaflex, Gabapentin and Tylenol arthritis

## 2020-08-05 NOTE — Assessment & Plan Note (Signed)
Controlled on Lisinopril HCT, Potassium Amlodipine Reinforced DASH diet and exercise weight loss C-Met today

## 2020-08-05 NOTE — Assessment & Plan Note (Signed)
Currently not on testosterone injections

## 2020-08-05 NOTE — Assessment & Plan Note (Signed)
C-Met and lipid profile today Encouraged him to consume a low-fat diet Continue Atorvastatin, Ezetimibe and Fish Oil

## 2020-08-06 ENCOUNTER — Other Ambulatory Visit: Payer: Self-pay | Admitting: Internal Medicine

## 2020-08-06 DIAGNOSIS — M791 Myalgia, unspecified site: Secondary | ICD-10-CM

## 2020-08-06 DIAGNOSIS — M2550 Pain in unspecified joint: Secondary | ICD-10-CM

## 2020-08-06 LAB — COMPLETE METABOLIC PANEL WITH GFR
AG Ratio: 1.8 (calc) (ref 1.0–2.5)
ALT: 29 U/L (ref 9–46)
AST: 25 U/L (ref 10–35)
Albumin: 4.2 g/dL (ref 3.6–5.1)
Alkaline phosphatase (APISO): 89 U/L (ref 35–144)
BUN: 13 mg/dL (ref 7–25)
CO2: 26 mmol/L (ref 20–32)
Calcium: 9.3 mg/dL (ref 8.6–10.3)
Chloride: 99 mmol/L (ref 98–110)
Creat: 1.18 mg/dL (ref 0.70–1.33)
GFR, Est African American: 79 mL/min/{1.73_m2} (ref >=60)
GFR, Est Non African American: 69 mL/min/{1.73_m2} (ref >=60)
Globulin: 2.4 g/dL (calc) (ref 1.9–3.7)
Glucose, Bld: 167 mg/dL — ABNORMAL HIGH (ref 65–99)
Potassium: 4.1 mmol/L (ref 3.5–5.3)
Sodium: 134 mmol/L — ABNORMAL LOW (ref 135–146)
Total Bilirubin: 0.7 mg/dL (ref 0.2–1.2)
Total Protein: 6.6 g/dL (ref 6.1–8.1)

## 2020-08-06 LAB — CBC WITH DIFFERENTIAL/PLATELET
Absolute Monocytes: 486 cells/uL (ref 200–950)
Basophils Absolute: 58 cells/uL (ref 0–200)
Basophils Relative: 0.9 %
Eosinophils Absolute: 141 cells/uL (ref 15–500)
Eosinophils Relative: 2.2 %
HCT: 42.4 % (ref 38.5–50.0)
Hemoglobin: 13.7 g/dL (ref 13.2–17.1)
Lymphs Abs: 1363 cells/uL (ref 850–3900)
MCH: 27.2 pg (ref 27.0–33.0)
MCHC: 32.3 g/dL (ref 32.0–36.0)
MCV: 84.1 fL (ref 80.0–100.0)
MPV: 10.7 fL (ref 7.5–12.5)
Monocytes Relative: 7.6 %
Neutro Abs: 4352 cells/uL (ref 1500–7800)
Neutrophils Relative %: 68 %
Platelets: 233 10*3/uL (ref 140–400)
RBC: 5.04 10*6/uL (ref 4.20–5.80)
RDW: 14 % (ref 11.0–15.0)
Total Lymphocyte: 21.3 %
WBC: 6.4 10*3/uL (ref 3.8–10.8)

## 2020-08-06 LAB — HEMOGLOBIN A1C
Hgb A1c MFr Bld: 6.8 % of total Hgb — ABNORMAL HIGH (ref <5.7)
Mean Plasma Glucose: 148 mg/dL
eAG (mmol/L): 8.2 mmol/L

## 2020-08-06 LAB — LIPID PANEL
Cholesterol: 118 mg/dL (ref <200)
HDL: 28 mg/dL — ABNORMAL LOW (ref >=40)
LDL Cholesterol (Calc): 59 mg/dL (calc)
Non-HDL Cholesterol (Calc): 90 mg/dL (calc) (ref <130)
Total CHOL/HDL Ratio: 4.2 (calc) (ref <5.0)
Triglycerides: 264 mg/dL — ABNORMAL HIGH (ref <150)

## 2020-08-06 NOTE — Telephone Encounter (Signed)
Requested Prescriptions  Pending Prescriptions Disp Refills  . celecoxib (CELEBREX) 100 MG capsule [Pharmacy Med Name: CELECOXIB 100 MG CAP] 180 capsule 1    Sig: TAKE 1 CAPSULE BY MOUTH TWICE DAILY     Analgesics:  COX2 Inhibitors Passed - 08/06/2020  4:34 PM      Passed - HGB in normal range and within 360 days    Hemoglobin  Date Value Ref Range Status  08/05/2020 13.7 13.2 - 17.1 g/dL Final         Passed - Cr in normal range and within 360 days    Creat  Date Value Ref Range Status  08/05/2020 1.18 0.70 - 1.33 mg/dL Final    Comment:    For patients >6 years of age, the reference limit for Creatinine is approximately 13% higher for people identified as African-American. Verna Czech - Patient is not pregnant      Passed - Valid encounter within last 12 months    Recent Outpatient Visits          Yesterday Type 2 diabetes mellitus without complication, without long-term current use of insulin (HCC)   [facility] [place], Salvadore Oxford, NP             . lamoTRIgine (LAMICTAL) 100 MG tablet [Pharmacy Med Name: LAMOTRIGINE 100 MG TAB] 90 tablet 1    Sig: TAKE 1 TABLET BY MOUTH EVERY MORNING     Not Delegated - Neurology:  Anticonvulsants Failed - 08/06/2020  4:34 PM      Failed - This refill cannot be delegated      Passed - HCT in normal range and within 360 days    HCT  Date Value Ref Range Status  08/05/2020 42.4 38.5 - 50.0 % Final         Passed - HGB in normal range and within 360 days    Hemoglobin  Date Value Ref Range Status  08/05/2020 13.7 13.2 - 17.1 g/dL Final         Passed - PLT in normal range and within 360 days    Platelets  Date Value Ref Range Status  08/05/2020 233 140 - 400 Thousand/uL Final         Passed - WBC in normal range and within 360 days    WBC  Date Value Ref Range Status  08/05/2020 6.4 3.8 - 10.8 Thousand/uL Final         Passed - Valid encounter within last 12 months    Recent Outpatient Visits           Yesterday Type 2 diabetes mellitus without complication, without long-term current use of insulin (HCC)   [facility] [place], [place], NP             . FLUoxetine (PROZAC) 40 MG capsule [Pharmacy Med Name: FLUOXETINE HCL 40 MG CAP] 90 capsule 1    Sig: TAKE 1 CAPSULE BY MOUTH ONCE DAILY     Psychiatry:  Antidepressants - SSRI Passed - 08/06/2020  4:34 PM      Passed - Completed PHQ-2 or PHQ-9 in the last 360 days      Passed - Valid encounter within last 6 months    Recent Outpatient Visits          Yesterday Type 2 diabetes mellitus without complication, without long-term current use of insulin [facility])   [facility] [place], Salvadore Oxford, NP

## 2020-08-06 NOTE — Telephone Encounter (Signed)
Requested medications are due for refill today.  yes  Requested medications are on the active medications list.  yes  Last refill. 02/01/2020  Future visit scheduled.  no  Notes to clinic.  Medication not delegated.

## 2020-08-12 MED ORDER — ATORVASTATIN CALCIUM 40 MG PO TABS: 40.0000 mg | ORAL_TABLET | Freq: Every day | ORAL | 0 refills | Status: DC

## 2020-08-12 NOTE — Addendum Note (Signed)
Addended by: Lorre Munroe on: 08/12/2020 11:20 AM   Modules accepted: Orders

## 2020-08-18 ENCOUNTER — Other Ambulatory Visit: Payer: Self-pay | Admitting: Internal Medicine

## 2020-08-27 ENCOUNTER — Other Ambulatory Visit: Payer: Self-pay | Admitting: Internal Medicine

## 2020-09-05 ENCOUNTER — Other Ambulatory Visit: Payer: Self-pay | Admitting: Internal Medicine

## 2020-11-24 ENCOUNTER — Other Ambulatory Visit: Payer: Self-pay | Admitting: Internal Medicine

## 2020-12-04 ENCOUNTER — Other Ambulatory Visit: Payer: Self-pay | Admitting: Family

## 2020-12-05 ENCOUNTER — Telehealth: Payer: Self-pay | Admitting: Internal Medicine

## 2020-12-05 NOTE — Telephone Encounter (Signed)
[facility], calling on behalf of pt, states that the pt is also needing to have Januvia prescription. Pt is not seeing provider who prescribed medication any longer and is requesting to have PCP fill. Pt is completely out of medication. Please advise.      [address] - [place], [place] - [address]  [address] [place] [address]  Phone: [phone] Fax: [phone]  Hours: Not open 24 hours

## 2020-12-06 NOTE — Telephone Encounter (Signed)
Requested medications are due for refill today just filled 10/6  Requested medications are on the active medication list yes  Last refill 12/04/20  Last visit 08/05/20  Future visit scheduled no, was asked to return in 3 months, Sept  Notes to clinic just filled 10/6, did not return as requested, please assess.

## 2020-12-10 NOTE — Telephone Encounter (Signed)
Pt called to find out the issue of getting his JANUVIA 100 MG tablet  Advised pt he needed a 3 mo follow up appointment.  Pt has made appointment for 10/18 @ 8 am.   But he is out of this medication and wants to know if you can send in this med today, as he is completely out.   [facility] - [place] - [address]

## 2020-12-11 MED ORDER — SITAGLIPTIN PHOSPHATE 100 MG PO TABS: 100.0000 mg | ORAL_TABLET | Freq: Every day | ORAL | 0 refills | Status: DC

## 2020-12-11 NOTE — Addendum Note (Signed)
Addended by: Lorre Munroe on: 12/11/2020 06:08 AM   Modules accepted: Orders

## 2020-12-11 NOTE — Telephone Encounter (Signed)
Januvia refilled.  He should keep his upcoming appointment.

## 2020-12-16 ENCOUNTER — Other Ambulatory Visit: Payer: Self-pay

## 2020-12-16 ENCOUNTER — Ambulatory Visit (INDEPENDENT_AMBULATORY_CARE_PROVIDER_SITE_OTHER): Payer: 59 | Admitting: Internal Medicine

## 2020-12-16 ENCOUNTER — Encounter: Payer: Self-pay | Admitting: Internal Medicine

## 2020-12-16 VITALS — BP 138/74 | HR 71 | Temp 97.7°F | Resp 17 | Ht 72.0 in | Wt 355.5 lb

## 2020-12-16 DIAGNOSIS — Z23 Encounter for immunization: Secondary | ICD-10-CM | POA: Diagnosis not present

## 2020-12-16 DIAGNOSIS — I10 Essential (primary) hypertension: Secondary | ICD-10-CM

## 2020-12-16 DIAGNOSIS — E119 Type 2 diabetes mellitus without complications: Secondary | ICD-10-CM

## 2020-12-16 DIAGNOSIS — E7800 Pure hypercholesterolemia, unspecified: Secondary | ICD-10-CM | POA: Diagnosis not present

## 2020-12-16 DIAGNOSIS — Z6841 Body Mass Index (BMI) 40.0 and over, adult: Secondary | ICD-10-CM | POA: Insufficient documentation

## 2020-12-16 NOTE — Assessment & Plan Note (Signed)
C-Met and lipid profile today Encouraged him to consume a low-fat diet Continue Rosuvastatin,Eezetimibe and Fish Oil

## 2020-12-16 NOTE — Assessment & Plan Note (Signed)
A1c today No urine microalbumin secondary to ACEI therapy Encouraged him to consume a low-carb diet and exercise for weight loss Continue Glipizide and Januvia Encourage routine eye exams Encourage routine foot exams As long as his A1c is controlled, he will not need to follow-up with endocrinology Flu shot today Pneumovax and Prevnar UTD Encouraged him to get his COVID booster

## 2020-12-16 NOTE — Progress Notes (Signed)
Subjective:    Patient ID: Anthony Castillo, male    DOB: 09-27-1965, 55 y.o.   MRN: [id number]  HPI  Patient presents the clinic today for 73-month follow-up.  DM2: His last A1c was 6.8%, 07/2020.  He sugars range 124-200  He is taking Glipizide and Januvia as prescribed.  He does not check his feet routinely.  His last eye exam was more than 1 year ago.  He is seeing endocrinology.  Flu 11/2018.  Pneumovax 07/2016.  Prevnar 11/2017.  COVID Moderna x2.  HTN: His BP today is 138/74.  He is taking Lisinopril HCT, Amlodipine and Potassium as prescribed.  ECG from 04/2020 reviewed.  HLD: His last LDL was 59, triglycerides 371, 07/2020.  He denies myalgias on Atorvastatin, Ezetimibe and Fish Oil.  He does not consume a low-fat diet.  Review of Systems     Past Medical History:  Diagnosis Date   Chronic pain 04/09/2011   DEPRESSION 03/16/2007   GERD (gastroesophageal reflux disease)    HYPERTENSION 09/22/2006   Hypogonadism male 04/09/2011   Morbid obesity (HCC) 09/22/2006   Osteoarthritis 04/09/2011   Sleep apnea    uses a cpap-will use after surgery   Wears glasses     Current Outpatient Medications  Medication Sig Dispense Refill   Acetaminophen (TYLENOL ARTHRITIS PAIN PO) Take by mouth as needed.     amLODipine (NORVASC) 10 MG tablet TAKE 1 TABLET BY MOUTH ONCE A DAY 90 tablet 0   atorvastatin (LIPITOR) 40 MG tablet Take 1 tablet (40 mg total) by mouth daily. 90 tablet 0   augmented betamethasone dipropionate (DIPROLENE-AF) 0.05 % ointment Apply topically.     buPROPion (WELLBUTRIN SR) 150 MG 12 hr tablet   0   celecoxib (CELEBREX) 100 MG capsule TAKE 1 CAPSULE BY MOUTH TWICE DAILY 180 capsule 1   cetirizine (ZYRTEC) 10 MG tablet Take 10 mg by mouth daily.     Cholecalciferol (VITAMIN D3) 1000 units CAPS Take 1 capsule by mouth daily.     Continuous Blood Gluc Sensor (FREESTYLE LIBRE 14 DAY SENSOR) MISC SMARTSIG:1 Each Topical Every 2 Weeks     ezetimibe (ZETIA) 10 MG tablet TAKE 1 TABLET  BY MOUTH ONCE A DAY 90 tablet 3   fluocinonide cream (LIDEX) 0.05 % as needed.   1   FLUoxetine (PROZAC) 40 MG capsule TAKE 1 CAPSULE BY MOUTH ONCE DAILY 90 capsule 1   fluticasone (FLONASE) 50 MCG/ACT nasal spray Place 2 sprays into both nostrils as needed.      glipiZIDE (GLUCOTROL) 10 MG tablet TAKE 1 TABLET BY MOUTH TWICE A DAY BEFORE A MEAL. 180 tablet 0   lamoTRIgine (LAMICTAL) 100 MG tablet TAKE 1 TABLET BY MOUTH EVERY MORNING 90 tablet 1   lisinopril-hydrochlorothiazide (ZESTORETIC) 20-12.5 MG tablet TAKE 2 TABLETS BY MOUTH ONCE A DAY 180 tablet 3   pantoprazole (PROTONIX) 40 MG tablet TAKE 1 TABLET BY MOUTH ONCE A DAY 90 tablet 0   potassium chloride (KLOR-CON) 10 MEQ tablet TAKE 1 TABLET BY MOUTH ONCE A DAY 90 tablet 3   sitaGLIPtin (JANUVIA) 100 MG tablet Take 1 tablet (100 mg total) by mouth daily. 30 tablet 0   sitaGLIPtin (JANUVIA) 100 MG tablet Take 1 tablet (100 mg total) by mouth daily. 30 tablet 0   tamsulosin (FLOMAX) 0.4 MG CAPS capsule TAKE 1 CAPSULE BY MOUTH ONCE DAILY 30 capsule 3   tizanidine (ZANAFLEX) 6 MG capsule Take 1 capsule (6 mg total) by mouth 3 (three) times  daily as needed for muscle spasms. 20 capsule 0   No current facility-administered medications for this visit.    Allergies  Allergen Reactions   Penicillins     REACTION: unspecified    Family History  Problem Relation Age of Onset   Cancer Mother        lymphoma   Heart disease Father    Hypertension Father    Diabetes Sister    Stroke Paternal Uncle    Diabetes Maternal Grandmother    Arthritis Paternal Grandmother    Heart disease Paternal Grandfather    Migraines Daughter    Irritable bowel syndrome Daughter    Heart Problems Son        valve     Social History   Socioeconomic History   Marital status: Married    Spouse name: Not on file   Number of children: Not on file   Years of education: Not on file   Highest education level: Not on file  Occupational History   Not on  file  Tobacco Use   Smoking status: Never   Smokeless tobacco: Current    Types: Chew   Tobacco comments:    has been using chew 40 years  Vaping Use   Vaping Use: Never used  Substance and Sexual Activity   Alcohol use: Yes    Comment: rare   Drug use: Never   Sexual activity: Yes  Other Topics Concern   Not on file  Social History Narrative   Not on file   Social Determinants of Health   Financial Resource Strain: Not on file  Food Insecurity: Not on file  Transportation Needs: Not on file  Physical Activity: Not on file  Stress: Not on file  Social Connections: Not on file  Intimate Partner Violence: Not on file     Constitutional: Patient reports fatigue.  Denies fever, malaise, headache or abrupt weight changes.  HEENT: Denies eye pain, eye redness, ear pain, ringing in the ears, wax buildup, runny nose, nasal congestion, bloody nose, or sore throat. Respiratory: Denies difficulty breathing, shortness of breath, cough or sputum production.   Cardiovascular: Denies chest pain, chest tightness, palpitations or swelling in the hands or feet.  Gastrointestinal: Denies abdominal pain, bloating, constipation, diarrhea or blood in the stool.  GU: Denies urgency, frequency, pain with urination, burning sensation, blood in urine, odor or discharge. Musculoskeletal: Patient reports joint pain.  Denies decrease in range of motion, difficulty with gait, muscle pain or joint swelling.  Skin: Denies redness, rashes, lesions or ulcercations.  Neurological: Denies dizziness, difficulty with memory, difficulty with speech or problems with balance and coordination.  Psych: Patient has a history of depression.  Denies anxiety, SI/HI.  No other specific complaints in a complete review of systems (except as listed in HPI above).  Objective:   Physical Exam  BP 138/74 (BP Location: Right Arm, Patient Position: Sitting, Cuff Size: Large)   Pulse 71   Temp 97.7 F (36.5 C) (Temporal)    Resp 17   Ht 6' (1.829 m)   Wt (!) 355 lb 8 oz (161.3 kg)   SpO2 100%   BMI 48.21 kg/m   Wt Readings from Last 3 Encounters:  08/05/20 (!) 349 lb 8 oz (158.5 kg)  04/08/20 (!) 355 lb (161 kg)  01/23/20 (!) 352 lb (159.7 kg)    General: Appears his stated age, obese, in NAD. Skin: Warm, dry and intact. No ulcerations noted. HEENT: Head: normal shape and size; Eyes:  EOMs intact;  Cardiovascular: Normal rate and rhythm. S1,S2 noted.  No murmur, rubs or gallops noted. No JVD or BLE edema. No carotid bruits noted. Pulmonary/Chest: Normal effort and positive vesicular breath sounds. No respiratory distress. No wheezes, rales or ronchi noted.  Musculoskeletal: No difficulty with gait.  Neurological: Alert and oriented.   BMET    Component Value Date/Time   NA 134 (L) 08/05/2020 0955   K 4.1 08/05/2020 0955   CL 99 08/05/2020 0955   CO2 26 08/05/2020 0955   GLUCOSE 167 (H) 08/05/2020 0955   BUN 13 08/05/2020 0955   CREATININE 1.18 08/05/2020 0955   CALCIUM 9.3 08/05/2020 0955   GFRNONAA 69 08/05/2020 0955   GFRAA 79 08/05/2020 0955    Lipid Panel     Component Value Date/Time   CHOL 118 08/05/2020 0955   TRIG 264 (H) 08/05/2020 0955   HDL 28 (L) 08/05/2020 0955   CHOLHDL 4.2 08/05/2020 0955   VLDL 28.0 01/23/2020 1413   LDLCALC 59 08/05/2020 0955    CBC    Component Value Date/Time   WBC 6.4 08/05/2020 0955   RBC 5.04 08/05/2020 0955   HGB 13.7 08/05/2020 0955   HCT 42.4 08/05/2020 0955   PLT 233 08/05/2020 0955   MCV 84.1 08/05/2020 0955   MCH 27.2 08/05/2020 0955   MCHC 32.3 08/05/2020 0955   RDW 14.0 08/05/2020 0955   LYMPHSABS 1,363 08/05/2020 0955   EOSABS 141 08/05/2020 0955   BASOSABS 58 08/05/2020 0955    Hgb A1C Lab Results  Component Value Date   HGBA1C 6.8 (H) 08/05/2020            Assessment & Plan:     Nicki Reaper, NP This visit occurred during the SARS-CoV-2 public health emergency.  Safety protocols were in place, including  screening questions prior to the visit, additional usage of staff PPE, and extensive cleaning of exam room while observing appropriate contact time as indicated for disinfecting solutions.

## 2020-12-16 NOTE — Patient Instructions (Signed)
Carbohydrate Counting for Diabetes Mellitus, Adult Carbohydrate counting is a method of keeping track of how many carbohydrates you eat. Eating carbohydrates naturally increases the amount of sugar (glucose) in the blood. Counting how many carbohydrates you eat improves your blood glucose control, which helps you manage your diabetes. It is important to know how many carbohydrates you can safely have in each meal. This is different for every person. A dietitian can help you make a meal plan and calculate how many carbohydrates you should have at each meal and snack. What foods contain carbohydrates? Carbohydrates are found in the following foods: Grains, such as breads and cereals. Dried beans and soy products. Starchy vegetables, such as potatoes, peas, and corn. Fruit and fruit juices. Milk and yogurt. Sweets and snack foods, such as cake, cookies, candy, chips, and soft drinks. How do I count carbohydrates in foods? There are two ways to count carbohydrates in food. You can read food labels or learn standard serving sizes of foods. You can use either of the methods or a combination of both. Using the Nutrition Facts label The Nutrition Facts list is included on the labels of almost all packaged foods and beverages in the [place] It includes: The serving size. Information about nutrients in each serving, including the grams (g) of carbohydrate per serving. To use the Nutrition Facts: Decide how many servings you will have. Multiply the number of servings by the number of carbohydrates per serving. The resulting number is the total amount of carbohydrates that you will be having. Learning the standard serving sizes of foods When you eat carbohydrate foods that are not packaged or do not include Nutrition Facts on the label, you need to measure the servings in order to count the amount of carbohydrates. Measure the foods that you will eat with a food scale or measuring cup, if needed. Decide how  many standard-size servings you will eat. Multiply the number of servings by 15. For foods that contain carbohydrates, one serving equals 15 g of carbohydrates. For example, if you eat 2 cups or 10 oz (300 g) of strawberries, you will have eaten 2 servings and 30 g of carbohydrates (2 servings x 15 g = 30 g). For foods that have more than one food mixed, such as soups and casseroles, you must count the carbohydrates in each food that is included. The following list contains standard serving sizes of common carbohydrate-rich foods. Each of these servings has about 15 g of carbohydrates: 1 slice of bread. 1 six-inch (15 cm) tortilla. ? cup or 2 oz (53 g) cooked rice or pasta.  cup or 3 oz (85 g) cooked or canned, drained and rinsed beans or lentils.  cup or 3 oz (85 g) starchy vegetable, such as peas, corn, or squash.  cup or 4 oz (120 g) hot cereal.  cup or 3 oz (85 g) boiled or mashed potatoes, or  or 3 oz (85 g) of a large baked potato.  cup or 4 fl oz (118 mL) fruit juice. 1 cup or 8 fl oz (237 mL) milk. 1 small or 4 oz (106 g) apple.  or 2 oz (63 g) of a medium banana. 1 cup or 5 oz (150 g) strawberries. 3 cups or 1 oz (24 g) popped popcorn. What is an example of carbohydrate counting? To calculate the number of carbohydrates in this sample meal, follow the steps shown below. Sample meal 3 oz (85 g) chicken breast. ? cup or 4 oz (106 g) brown   rice.  cup or 3 oz (85 g) corn. 1 cup or 8 fl oz (237 mL) milk. 1 cup or 5 oz (150 g) strawberries with sugar-free whipped topping. Carbohydrate calculation Identify the foods that contain carbohydrates: Rice. Corn. Milk. Strawberries. Calculate how many servings you have of each food: 2 servings rice. 1 serving corn. 1 serving milk. 1 serving strawberries. Multiply each number of servings by 15 g: 2 servings rice x 15 g = 30 g. 1 serving corn x 15 g = 15 g. 1 serving milk x 15 g = 15 g. 1 serving strawberries x 15 g = 15  g. Add together all of the amounts to find the total grams of carbohydrates eaten: 30 g + 15 g + 15 g + 15 g = 75 g of carbohydrates total. What are tips for following this plan? Shopping Develop a meal plan and then make a shopping list. Buy fresh and frozen vegetables, fresh and frozen fruit, dairy, eggs, beans, lentils, and whole grains. Look at food labels. Choose foods that have more fiber and less sugar. Avoid processed foods and foods with added sugars. Meal planning Aim to have the same amount of carbohydrates at each meal and for each snack time. Plan to have regular, balanced meals and snacks. Where to find more information American Diabetes Association: www.diabetes.org Centers for Disease Control and Prevention: www.cdc.gov Summary Carbohydrate counting is a method of keeping track of how many carbohydrates you eat. Eating carbohydrates naturally increases the amount of sugar (glucose) in the blood. Counting how many carbohydrates you eat improves your blood glucose control, which helps you manage your diabetes. A dietitian can help you make a meal plan and calculate how many carbohydrates you should have at each meal and snack. This information is not intended to replace advice given to you by your health care provider. Make sure you discuss any questions you have with your health care provider. Document Revised: 02/15/2019 Document Reviewed: 02/16/2019 Elsevier Patient Education  2021 Elsevier Inc.  

## 2020-12-16 NOTE — Assessment & Plan Note (Signed)
Encourage diet and exercise for weight loss 

## 2020-12-16 NOTE — Assessment & Plan Note (Signed)
Controlled on Lisinopril HCT Reinforced DASH diet and exercise for weight loss C-Met today

## 2020-12-17 LAB — COMPLETE METABOLIC PANEL WITH GFR
AG Ratio: 1.9 (calc) (ref 1.0–2.5)
ALT: 24 U/L (ref 9–46)
AST: 19 U/L (ref 10–35)
Albumin: 4.3 g/dL (ref 3.6–5.1)
Alkaline phosphatase (APISO): 82 U/L (ref 35–144)
BUN: 13 mg/dL (ref 7–25)
CO2: 28 mmol/L (ref 20–32)
Calcium: 9.2 mg/dL (ref 8.6–10.3)
Chloride: 98 mmol/L (ref 98–110)
Creat: 1.13 mg/dL (ref 0.70–1.30)
Globulin: 2.3 g/dL (calc) (ref 1.9–3.7)
Glucose, Bld: 209 mg/dL — ABNORMAL HIGH (ref 65–99)
Potassium: 4.3 mmol/L (ref 3.5–5.3)
Sodium: 135 mmol/L (ref 135–146)
Total Bilirubin: 0.8 mg/dL (ref 0.2–1.2)
Total Protein: 6.6 g/dL (ref 6.1–8.1)
eGFR: 76 mL/min/{1.73_m2} (ref >=60)

## 2020-12-17 LAB — LIPID PANEL
Cholesterol: 107 mg/dL
HDL: 27 mg/dL — ABNORMAL LOW
LDL Cholesterol (Calc): 48 mg/dL
Non-HDL Cholesterol (Calc): 80 mg/dL
Total CHOL/HDL Ratio: 4 (calc)
Triglycerides: 303 mg/dL — ABNORMAL HIGH

## 2020-12-17 LAB — HEMOGLOBIN A1C
Hgb A1c MFr Bld: 8.1 % of total Hgb — ABNORMAL HIGH (ref <5.7)
Mean Plasma Glucose: 186 mg/dL
eAG (mmol/L): 10.3 mmol/L

## 2020-12-29 ENCOUNTER — Other Ambulatory Visit: Payer: Self-pay | Admitting: Internal Medicine

## 2020-12-29 NOTE — Telephone Encounter (Signed)
Requested Prescriptions  Pending Prescriptions Disp Refills  . JANUVIA 100 MG tablet [Pharmacy Med Name: JANUVIA 100 MG TAB] 30 tablet 5    Sig: TAKE 1 TABLET BY MOUTH ONCE A DAY     Endocrinology:  Diabetes - DPP-4 Inhibitors Failed - 12/29/2020  8:34 AM      Failed - HBA1C is between 0 and 7.9 and within 180 days    Hgb A1c MFr Bld  Date Value Ref Range Status  12/16/2020 8.1 (H) <5.7 % of total Hgb Final    Comment:    For someone without known diabetes, a hemoglobin A1c value of 6.5% or greater indicates that they may have  diabetes and this should be confirmed with a follow-up  test. . For someone with known diabetes, a value <7% indicates  that their diabetes is well controlled and a value  greater than or equal to 7% indicates suboptimal  control. A1c targets should be individualized based on  duration of diabetes, age, comorbid conditions, and  other considerations. . Currently, no consensus exists regarding use of hemoglobin A1c for diagnosis of diabetes for children. .          Passed - Cr in normal range and within 360 days    Creat  Date Value Ref Range Status  12/16/2020 1.13 0.70 - 1.30 mg/dL Final         Passed - Valid encounter within last 6 months    Recent Outpatient Visits          1 week ago Type 2 diabetes mellitus without complication, without long-term current use of insulin [facility])   [facility] [place], [place] W, NP   4 months ago Type 2 diabetes mellitus without complication, without long-term current use of insulin [facility])   [facility] [place], Salvadore [place], NP

## 2021-01-29 ENCOUNTER — Other Ambulatory Visit: Payer: Self-pay | Admitting: Internal Medicine

## 2021-01-30 NOTE — Telephone Encounter (Signed)
Requested medication (s) are due for refill today: yes  Requested medication (s) are on the active medication list: yes  Last refill:  12/04/20  Future visit scheduled: no  Notes to clinic:  Historical Provider, please assess.       Requested Prescriptions  Pending Prescriptions Disp Refills   Continuous Blood Gluc Sensor (FREESTYLE LIBRE 14 DAY SENSOR) MISC [Pharmacy Med Name: FREESTYLE LIBRE 14 DAY SENSOR] 2 each     Sig: USE 1 SENSOR EVERY 14 DAYS FOR GLUCOSE MONITORING     Endocrinology: Diabetes - Testing Supplies Passed - 01/29/2021 10:39 AM      Passed - Valid encounter within last 12 months    Recent Outpatient Visits           1 month ago Type 2 diabetes mellitus without complication, without long-term current use of insulin [facility])   [facility] [place], [place], NP   5 months ago Type 2 diabetes mellitus without complication, without long-term current use of insulin [facility])   [facility] [place], Salv[place]ore [place], NP

## 2021-02-02 ENCOUNTER — Other Ambulatory Visit: Payer: Self-pay | Admitting: Internal Medicine

## 2021-02-02 ENCOUNTER — Other Ambulatory Visit: Payer: Self-pay | Admitting: Cardiovascular Disease

## 2021-02-02 DIAGNOSIS — E782 Mixed hyperlipidemia: Secondary | ICD-10-CM

## 2021-02-02 DIAGNOSIS — M2550 Pain in unspecified joint: Secondary | ICD-10-CM

## 2021-02-02 DIAGNOSIS — Z8249 Family history of ischemic heart disease and other diseases of the circulatory system: Secondary | ICD-10-CM

## 2021-02-02 DIAGNOSIS — M791 Myalgia, unspecified site: Secondary | ICD-10-CM

## 2021-02-03 NOTE — Telephone Encounter (Signed)
Requested medication (s) are due for refill today: yes  Requested medication (s) are on the active medication list: yes  Last refill:  11/05/20  Future visit scheduled: 03/18/21  Notes to clinic:  This medication can not be delegated, please assess.    Requested Prescriptions  Pending Prescriptions Disp Refills   lamoTRIgine (LAMICTAL) 100 MG tablet [Pharmacy Med Name: LAMOTRIGINE 100 MG TAB] 90 tablet 1    Sig: TAKE 1 TABLET BY MOUTH EVERY MORNING     Not Delegated - Neurology:  Anticonvulsants Failed - 02/02/2021  6:12 PM      Failed - This refill cannot be delegated      Passed - HCT in normal range and within 360 days    HCT  Date Value Ref Range Status  08/05/2020 42.4 38.5 - 50.0 % Final          Passed - HGB in normal range and within 360 days    Hemoglobin  Date Value Ref Range Status  08/05/2020 13.7 13.2 - 17.1 g/dL Final          Passed - PLT in normal range and within 360 days    Platelets  Date Value Ref Range Status  08/05/2020 233 140 - 400 Thousand/uL Final          Passed - WBC in normal range and within 360 days    WBC  Date Value Ref Range Status  08/05/2020 6.4 3.8 - 10.8 Thousand/uL Final          Passed - Valid encounter within last 12 months    Recent Outpatient Visits           1 month ago Type 2 diabetes mellitus without complication, without long-term current use of insulin (HCC)   [facility] [place], [place], NP   6 months ago Type 2 diabetes mellitus without complication, without long-term current use of insulin (HCC)   [facility] [place], Salv[place], NP              Signed Prescriptions Disp Refills   celecoxib (CELEBREX) 100 MG capsule 180 capsule 1    Sig: TAKE 1 CAPSULE BY MOUTH TWICE DAILY     Analgesics:  COX2 Inhibitors Passed - 02/02/2021  6:12 PM      Passed - HGB in normal range and within 360 days    Hemoglobin  Date Value Ref Range Status  08/05/2020 13.7 13.2 - 17.1 g/dL Final           Passed - Cr in normal range and within 360 days    Creat  Date Value Ref Range Status  12/16/2020 1.13 0.70 - 1.30 mg/dL Final          Passed - Patient is not pregnant      Passed - Valid encounter within last 12 months    Recent Outpatient Visits           1 month ago Type 2 diabetes mellitus without complication, without long-term current use of insulin (HCC)   [facility] [place], [place] W, NP   6 months ago Type 2 diabetes mellitus without complication, without long-term current use of insulin (HCC)   [facility], [place] W, NP               FLUoxetine (PROZAC) 40 MG capsule 90 capsule 0    Sig: TAKE 1 CAPSULE BY MOUTH ONCE DAILY     Psychiatry:  Antidepressants - SSRI Passed - 02/02/2021  6:12 PM      Passed - Completed PHQ-2 or PHQ-9 in the last 360 days      Passed - Valid encounter within last 6 months    Recent Outpatient Visits           1 month ago Type 2 diabetes mellitus without complication, without long-term current use of insulin (HCC)   [facility] [place], [place] W, NP   6 months ago Type 2 diabetes mellitus without complication, without long-term current use of insulin (HCC)   [facility] [place], [place] Oxford, NP               tamsulosin (FLOMAX) 0.4 MG CAPS capsule 30 capsule 3    Sig: TAKE 1 CAPSULE BY MOUTH ONCE DAILY     Urology: Alpha-Adrenergic Blocker Passed - 02/02/2021  6:12 PM      Passed - Last BP in normal range    BP Readings from Last 1 Encounters:  12/16/20 138/74          Passed - Valid encounter within last 12 months    Recent Outpatient Visits           1 month ago Type 2 diabetes mellitus without complication, without long-term current use of insulin (HCC)   [facility] [place], [place], NP   6 months ago Type 2 diabetes mellitus without complication, without long-term current use of insulin [facility])   [facility] [place],  [place], NP

## 2021-02-03 NOTE — Telephone Encounter (Signed)
Requested Prescriptions  Pending Prescriptions Disp Refills  . celecoxib (CELEBREX) 100 MG capsule [Pharmacy Med Name: CELECOXIB 100 MG CAP] 180 capsule 1    Sig: TAKE 1 CAPSULE BY MOUTH TWICE DAILY     Analgesics:  COX2 Inhibitors Passed - 02/02/2021  6:12 PM      Passed - HGB in normal range and within 360 days    Hemoglobin  Date Value Ref Range Status  08/05/2020 13.7 13.2 - 17.1 g/dL Final         Passed - Cr in normal range and within 360 days    Creat  Date Value Ref Range Status  12/16/2020 1.13 0.70 - 1.30 mg/dL Final         Passed - Patient is not pregnant      Passed - Valid encounter within last 12 months    Recent Outpatient Visits          1 month ago Type 2 diabetes mellitus without complication, without long-term current use of insulin (HCC)   [facility] [place], [place] W, NP   6 months ago Type 2 diabetes mellitus without complication, without long-term current use of insulin (HCC)   [facility] [place], [place] W, NP             . FLUoxetine (PROZAC) 40 MG capsule [Pharmacy Med Name: FLUOXETINE HCL 40 MG CAP] 90 capsule 0    Sig: TAKE 1 CAPSULE BY MOUTH ONCE DAILY     Psychiatry:  Antidepressants - SSRI Passed - 02/02/2021  6:12 PM      Passed - Completed PHQ-2 or PHQ-9 in the last 360 days      Passed - Valid encounter within last 6 months    Recent Outpatient Visits          1 month ago Type 2 diabetes mellitus without complication, without long-term current use of insulin (HCC)   [facility] [place], [place] W, NP   6 months ago Type 2 diabetes mellitus without complication, without long-term current use of insulin (HCC)   [facility] [place], [place] W, NP             . tamsulosin (FLOMAX) 0.4 MG CAPS capsule [Pharmacy Med Name: TAMSULOSIN HCL 0.4 MG CAP] 30 capsule 3    Sig: TAKE 1 CAPSULE BY MOUTH ONCE DAILY     Urology: Alpha-Adrenergic Blocker Passed - 02/02/2021  6:12 PM       Passed - Last BP in normal range    BP Readings from Last 1 Encounters:  12/16/20 138/74         Passed - Valid encounter within last 12 months    Recent Outpatient Visits          1 month ago Type 2 diabetes mellitus without complication, without long-term current use of insulin (HCC)   [facility] [place], [place] W, NP   6 months ago Type 2 diabetes mellitus without complication, without long-term current use of insulin [facility])   [facility] [place], [place] W, NP             . lamoTRIgine (LAMICTAL) 100 MG tablet [Pharmacy Med Name: LAMOTRIGINE 100 MG TAB] 90 tablet 1    Sig: TAKE 1 TABLET BY MOUTH EVERY MORNING     Not Delegated - Neurology:  Anticonvulsants Failed - 02/02/2021  6:12 PM      Failed - This refill cannot be delegated  Passed - HCT in normal range and within 360 days    HCT  Date Value Ref Range Status  08/05/2020 42.4 38.5 - 50.0 % Final         Passed - HGB in normal range and within 360 days    Hemoglobin  Date Value Ref Range Status  08/05/2020 13.7 13.2 - 17.1 g/dL Final         Passed - PLT in normal range and within 360 days    Platelets  Date Value Ref Range Status  08/05/2020 233 140 - 400 Thousand/uL Final         Passed - WBC in normal range and within 360 days    WBC  Date Value Ref Range Status  08/05/2020 6.4 3.8 - 10.8 Thousand/uL Final         Passed - Valid encounter within last 12 months    Recent Outpatient Visits          1 month ago Type 2 diabetes mellitus without complication, without long-term current use of insulin [facility])   [facility] [place], [place], NP   6 months ago Type 2 diabetes mellitus without complication, without long-term current use of insulin [facility])   [facility] [place], [place], NP

## 2021-02-20 ENCOUNTER — Other Ambulatory Visit: Payer: Self-pay | Admitting: Internal Medicine

## 2021-02-20 NOTE — Telephone Encounter (Signed)
Pt scheduled for appt on 02/24/21 at 1000 with Rene Kocher, NP

## 2021-02-20 NOTE — Telephone Encounter (Signed)
Requested Prescriptions  Pending Prescriptions Disp Refills   amLODipine (NORVASC) 10 MG tablet [Pharmacy Med Name: AMLODIPINE BESYLATE 10 MG TAB] 90 tablet 0    Sig: TAKE 1 TABLET BY MOUTH ONCE A DAY     Cardiovascular:  Calcium Channel Blockers Passed - 02/20/2021  2:07 PM      Passed - Last BP in normal range    BP Readings from Last 1 Encounters:  12/16/20 138/74         Passed - Valid encounter within last 6 months    Recent Outpatient Visits          2 months ago Type 2 diabetes mellitus without complication, without long-term current use of insulin (HCC)   [facility] [place], [place], NP   6 months ago Type 2 diabetes mellitus without complication, without long-term current use of insulin (HCC)   [facility], [place], NP      Future Appointments            In 4 days [place], [place], NP [facility]            pantoprazole (PROTONIX) 40 MG tablet [Pharmacy Med Name: PANTOPRAZOLE SODIUM 40 MG DR TAB] 90 tablet 0    Sig: TAKE 1 TABLET BY MOUTH ONCE A DAY     Gastroenterology: Proton Pump Inhibitors Passed - 02/20/2021  2:07 PM      Passed - Valid encounter within last 12 months    Recent Outpatient Visits          2 months ago Type 2 diabetes mellitus without complication, without long-term current use of insulin (HCC)   [facility] [place], [place], NP   6 months ago Type 2 diabetes mellitus without complication, without long-term current use of insulin (HCC)   [facility] [place], [place], NP      Future Appointments            In 4 days [place], [place], NP [facility], [facility]

## 2021-02-24 ENCOUNTER — Ambulatory Visit: Payer: 59 | Admitting: Internal Medicine

## 2021-02-27 ENCOUNTER — Other Ambulatory Visit: Payer: Self-pay | Admitting: Internal Medicine

## 2021-02-27 NOTE — Telephone Encounter (Signed)
Requested Prescriptions  Pending Prescriptions Disp Refills   glipiZIDE (GLUCOTROL) 10 MG tablet [Pharmacy Med Name: GLIPIZIDE 10 MG TAB] 180 tablet 0    Sig: TAKE 1 TABLET BY MOUTH TWICE A DAY BEFORE A MEAL.     Endocrinology:  Diabetes - Sulfonylureas Failed - 02/27/2021  4:40 PM      Failed - HBA1C is between 0 and 7.9 and within 180 days    Hgb A1c MFr Bld  Date Value Ref Range Status  12/16/2020 8.1 (H) <5.7 % of total Hgb Final    Comment:    For someone without known diabetes, a hemoglobin A1c value of 6.5% or greater indicates that they may have  diabetes and this should be confirmed with a follow-up  test. . For someone with known diabetes, a value <7% indicates  that their diabetes is well controlled and a value  greater than or equal to 7% indicates suboptimal  control. A1c targets should be individualized based on  duration of diabetes, age, comorbid conditions, and  other considerations. . Currently, no consensus exists regarding use of hemoglobin A1c for diagnosis of diabetes for children. Verna Czech - Valid encounter within last 6 months    Recent Outpatient Visits          2 months ago Type 2 diabetes mellitus without complication, without long-term current use of insulin [facility])   [facility] [place], [place], NP   6 months ago Type 2 diabetes mellitus without complication, without long-term current use of insulin (HCC)   [facility], [place], NP      Future Appointments            In 3 weeks [place], [place], NP [facility], [facility]

## 2021-03-05 ENCOUNTER — Other Ambulatory Visit: Payer: Self-pay | Admitting: Internal Medicine

## 2021-03-06 NOTE — Telephone Encounter (Signed)
Requested Prescriptions  Pending Prescriptions Disp Refills   glipiZIDE (GLUCOTROL) 10 MG tablet [Pharmacy Med Name: GLIPIZIDE 10 MG TAB] 180 tablet 0    Sig: TAKE 1 TABLET BY MOUTH TWICE A DAY BEFORE A MEAL.     Endocrinology:  Diabetes - Sulfonylureas Failed - 03/05/2021  2:51 PM      Failed - HBA1C is between 0 and 7.9 and within 180 days    Hgb A1c MFr Bld  Date Value Ref Range Status  12/16/2020 8.1 (H) <5.7 % of total Hgb Final    Comment:    For someone without known diabetes, a hemoglobin A1c value of 6.5% or greater indicates that they may have  diabetes and this should be confirmed with a follow-up  test. . For someone with known diabetes, a value <7% indicates  that their diabetes is well controlled and a value  greater than or equal to 7% indicates suboptimal  control. A1c targets should be individualized based on  duration of diabetes, age, comorbid conditions, and  other considerations. . Currently, no consensus exists regarding use of hemoglobin A1c for diagnosis of diabetes for children. Anthony Castillo - Valid encounter within last 6 months    Recent Outpatient Visits          2 months ago Type 2 diabetes mellitus without complication, without long-term current use of insulin [facility])   [facility] [place], [place], NP   7 months ago Type 2 diabetes mellitus without complication, without long-term current use of insulin (HCC)   [facility], [place], NP      Future Appointments            In 2 weeks Anthony Castillo, [place], NP [facility], [facility]

## 2021-03-23 ENCOUNTER — Other Ambulatory Visit: Payer: Self-pay

## 2021-03-23 ENCOUNTER — Ambulatory Visit (INDEPENDENT_AMBULATORY_CARE_PROVIDER_SITE_OTHER): Payer: 59 | Admitting: Internal Medicine

## 2021-03-23 ENCOUNTER — Encounter: Payer: Self-pay | Admitting: Internal Medicine

## 2021-03-23 VITALS — BP 140/70 | HR 73 | Temp 97.3°F | Resp 18 | Ht 72.0 in | Wt 357.5 lb

## 2021-03-23 DIAGNOSIS — Z0001 Encounter for general adult medical examination with abnormal findings: Secondary | ICD-10-CM | POA: Diagnosis not present

## 2021-03-23 DIAGNOSIS — F324 Major depressive disorder, single episode, in partial remission: Secondary | ICD-10-CM

## 2021-03-23 DIAGNOSIS — Z23 Encounter for immunization: Secondary | ICD-10-CM | POA: Diagnosis not present

## 2021-03-23 DIAGNOSIS — Z125 Encounter for screening for malignant neoplasm of prostate: Secondary | ICD-10-CM

## 2021-03-23 NOTE — Progress Notes (Signed)
Subjective:    Patient ID: Anthony Castillo, male    DOB: Mar 03, 1965, 56 y.o.   MRN: [id number]  HPI  Pt presents to the clinic today for his annual exam.  Flu: 11/2020 Tetanus: 07/2016 Covid: Moderna x 2 Pneumovax: 07/2016 Prevnar: 11/2017 Shingrix: 03/2018, 09/2018 PSA screening: 12/2019 Colon screening: Vision screening: annually Dentist: as needed  Diet: He does eat meat. He consumes fruits and veggies. He does eat some fried foods. He drinks mostly water and soda. Exercise: None  Review of Systems     Past Medical History:  Diagnosis Date   Chronic pain 04/09/2011   DEPRESSION 03/16/2007   GERD (gastroesophageal reflux disease)    HYPERTENSION 09/22/2006   Hypogonadism male 04/09/2011   Morbid obesity (HCC) 09/22/2006   Osteoarthritis 04/09/2011   Sleep apnea    uses a cpap-will use after surgery   Wears glasses     Current Outpatient Medications  Medication Sig Dispense Refill   amLODipine (NORVASC) 10 MG tablet TAKE 1 TABLET BY MOUTH ONCE A DAY 90 tablet 0   pantoprazole (PROTONIX) 40 MG tablet TAKE 1 TABLET BY MOUTH ONCE A DAY 90 tablet 0   Acetaminophen (TYLENOL ARTHRITIS PAIN PO) Take by mouth as needed.     atorvastatin (LIPITOR) 40 MG tablet Take 1 tablet (40 mg total) by mouth daily. 90 tablet 0   augmented betamethasone dipropionate (DIPROLENE-AF) 0.05 % ointment Apply topically.     buPROPion (WELLBUTRIN SR) 150 MG 12 hr tablet   0   celecoxib (CELEBREX) 100 MG capsule TAKE 1 CAPSULE BY MOUTH TWICE DAILY 180 capsule 1   cetirizine (ZYRTEC) 10 MG tablet Take 10 mg by mouth daily.     Cholecalciferol (VITAMIN D3) 1000 units CAPS Take 1 capsule by mouth daily.     Continuous Blood Gluc Sensor (FREESTYLE LIBRE 14 DAY SENSOR) MISC USE 1 SENSOR EVERY 14 DAYS FOR GLUCOSE MONITORING 6 each 2   ezetimibe (ZETIA) 10 MG tablet TAKE 1 TABLET BY MOUTH ONCE A DAY 90 tablet 0   fluocinonide cream (LIDEX) 0.05 % as needed.   1   FLUoxetine (PROZAC) 40 MG capsule TAKE 1 CAPSULE  BY MOUTH ONCE DAILY 90 capsule 0   glipiZIDE (GLUCOTROL) 10 MG tablet TAKE 1 TABLET BY MOUTH TWICE A DAY BEFORE A MEAL. 180 tablet 0   JANUVIA 100 MG tablet TAKE 1 TABLET BY MOUTH ONCE A DAY 30 tablet 5   lamoTRIgine (LAMICTAL) 100 MG tablet TAKE 1 TABLET BY MOUTH EVERY MORNING 90 tablet 1   lisinopril-hydrochlorothiazide (ZESTORETIC) 20-12.5 MG tablet TAKE 2 TABLETS BY MOUTH ONCE A DAY 180 tablet 3   potassium chloride (KLOR-CON) 10 MEQ tablet TAKE 1 TABLET BY MOUTH ONCE A DAY 90 tablet 3   tamsulosin (FLOMAX) 0.4 MG CAPS capsule TAKE 1 CAPSULE BY MOUTH ONCE DAILY 30 capsule 3   tizanidine (ZANAFLEX) 6 MG capsule Take 1 capsule (6 mg total) by mouth 3 (three) times daily as needed for muscle spasms. 20 capsule 0   No current facility-administered medications for this visit.    Allergies  Allergen Reactions   Penicillins     REACTION: unspecified    Family History  Problem Relation Age of Onset   Cancer Mother        lymphoma   Heart disease Father    Hypertension Father    Diabetes Sister    Stroke Paternal Uncle    Diabetes Maternal Grandmother    Arthritis Paternal Grandmother  Heart disease Paternal Grandfather    Migraines Daughter    Irritable bowel syndrome Daughter    Heart Problems Son        valve     Social History   Socioeconomic History   Marital status: Married    Spouse name: Not on file   Number of children: Not on file   Years of education: Not on file   Highest education level: Not on file  Occupational History   Not on file  Tobacco Use   Smoking status: Never   Smokeless tobacco: Current    Types: Chew   Tobacco comments:    has been using chew 40 years  Vaping Use   Vaping Use: Never used  Substance and Sexual Activity   Alcohol use: Yes    Comment: rare   Drug use: Never   Sexual activity: Yes  Other Topics Concern   Not on file  Social History Narrative   Not on file   Social Determinants of Health   Financial Resource Strain:  Not on file  Food Insecurity: Not on file  Transportation Needs: Not on file  Physical Activity: Not on file  Stress: Not on file  Social Connections: Not on file  Intimate Partner Violence: Not on file     Constitutional: Patient reports chronic fatigue.  Denies fever, malaise, headache or abrupt weight changes.  HEENT: Denies eye pain, eye redness, ear pain, ringing in the ears, wax buildup, runny nose, nasal congestion, bloody nose, or sore throat. Respiratory: Patient reports shortness of breath with exertion.  Denies difficulty breathing, cough or sputum production.   Cardiovascular: Denies chest pain, chest tightness, palpitations or swelling in the hands or feet.  Gastrointestinal: Denies abdominal pain, bloating, constipation, diarrhea or blood in the stool.  GU: Denies urgency, frequency, pain with urination, burning sensation, blood in urine, odor or discharge. Musculoskeletal: Patient reports chronic joint and muscle pain.  Denies decrease in range of motion, difficulty with gait, or joint swelling.  Skin: Denies redness, rashes, lesions or ulcercations.  Neurological: Denies dizziness, difficulty with memory, difficulty with speech or problems with balance and coordination.  Psych: Patient has a history of depression.  Denies anxiety, SI/HI.  No other specific complaints in a complete review of systems (except as listed in HPI above).  Objective:   Physical Exam   BP (!) 147/67 (BP Location: Right Arm, Patient Position: Sitting, Cuff Size: Large)    Pulse 73    Temp (!) 97.3 F (36.3 C) (Temporal)    Resp 18    Ht 6' (1.829 m)    Wt (!) 357 lb 8 oz (162.2 kg)    SpO2 100%    BMI 48.49 kg/m   Wt Readings from Last 3 Encounters:  12/16/20 (!) 355 lb 8 oz (161.3 kg)  08/05/20 (!) 349 lb 8 oz (158.5 kg)  04/08/20 (!) 355 lb (161 kg)    General: Appears his stated age, obese, in NAD. Skin: Warm, dry and intact.  Scabbed skin avulsion noted to left hand.  No lower  extremity ulcerations noted. HEENT: Head: normal shape and size; Eyes: EOMs intact;  Neck:  Neck supple, trachea midline. No masses, lumps or thyromegaly present.  Cardiovascular: Normal rate and rhythm. S1,S2 noted.  No murmur, rubs or gallops noted. No JVD or BLE edema. No carotid bruits noted. Pulmonary/Chest: Normal effort and positive vesicular breath sounds. No respiratory distress. No wheezes, rales or ronchi noted.  Abdomen: Soft and nontender. Normal bowel  sounds.  Musculoskeletal: Strength 5/5 BUE/BLE.  No difficulty with gait.  Neurological: Alert and oriented. Cranial nerves II-XII grossly intact. Coordination normal.  Psychiatric: Mood and affect normal. Behavior is normal. Judgment and thought content normal.    BMET    Component Value Date/Time   NA 135 12/16/2020 0816   K 4.3 12/16/2020 0816   CL 98 12/16/2020 0816   CO2 28 12/16/2020 0816   GLUCOSE 209 (H) 12/16/2020 0816   BUN 13 12/16/2020 0816   CREATININE 1.13 12/16/2020 0816   CALCIUM 9.2 12/16/2020 0816   GFRNONAA 69 08/05/2020 0955   GFRAA 79 08/05/2020 0955    Lipid Panel     Component Value Date/Time   CHOL 107 12/16/2020 0816   TRIG 303 (H) 12/16/2020 0816   HDL 27 (L) 12/16/2020 0816   CHOLHDL 4.0 12/16/2020 0816   VLDL 28.0 01/23/2020 1413   LDLCALC 48 12/16/2020 0816    CBC    Component Value Date/Time   WBC 6.4 08/05/2020 0955   RBC 5.04 08/05/2020 0955   HGB 13.7 08/05/2020 0955   HCT 42.4 08/05/2020 0955   PLT 233 08/05/2020 0955   MCV 84.1 08/05/2020 0955   MCH 27.2 08/05/2020 0955   MCHC 32.3 08/05/2020 0955   RDW 14.0 08/05/2020 0955   LYMPHSABS 1,363 08/05/2020 0955   EOSABS 141 08/05/2020 0955   BASOSABS 58 08/05/2020 0955    Hgb A1C Lab Results  Component Value Date   HGBA1C 8.1 (H) 12/16/2020           Assessment & Plan:   Preventative Health Maintenance:  Flu shot UTD Tetanus UTD Pneumovax today Does not need Prevnar until he is [age] Shingrix UTD He  declines referral to GI for screening colonoscopy Encouraged him to consume a balanced diet and exercise regimen Advised him to consume a balanced diet and exercise regimen Will check CBC, CMET, Lipid profile, PSA, A1C and urine microalbumin today  RTC in 3 months, follow up chronic conditions  Nicki Reaperegina Lavaya Defreitas, NP This visit occurred during the SARS-CoV-2 public health emergency.  Safety protocols were in place, including screening questions prior to the visit, additional usage of staff PPE, and extensive cleaning of exam room while observing appropriate contact time as indicated for disinfecting solutions.

## 2021-03-23 NOTE — Addendum Note (Signed)
Addended by: Lonna Cobb on: 03/23/2021 11:00 AM   Modules accepted: Orders

## 2021-03-23 NOTE — Assessment & Plan Note (Signed)
Encourage diet and exercise for weight loss 

## 2021-03-23 NOTE — Patient Instructions (Signed)

## 2021-03-24 LAB — CBC
HCT: 42.8 % (ref 38.5–50.0)
Hemoglobin: 13.8 g/dL (ref 13.2–17.1)
MCH: 26.5 pg — ABNORMAL LOW (ref 27.0–33.0)
MCHC: 32.2 g/dL (ref 32.0–36.0)
MCV: 82.3 fL (ref 80.0–100.0)
MPV: 11.2 fL (ref 7.5–12.5)
Platelets: 222 10*3/uL (ref 140–400)
RBC: 5.2 10*6/uL (ref 4.20–5.80)
RDW: 13.1 % (ref 11.0–15.0)
WBC: 5.6 10*3/uL (ref 3.8–10.8)

## 2021-03-24 LAB — COMPLETE METABOLIC PANEL WITH GFR
AG Ratio: 1.6 (calc) (ref 1.0–2.5)
ALT: 22 U/L (ref 9–46)
AST: 16 U/L (ref 10–35)
Albumin: 4.1 g/dL (ref 3.6–5.1)
Alkaline phosphatase (APISO): 99 U/L (ref 35–144)
BUN: 12 mg/dL (ref 7–25)
CO2: 30 mmol/L (ref 20–32)
Calcium: 9.5 mg/dL (ref 8.6–10.3)
Chloride: 96 mmol/L — ABNORMAL LOW (ref 98–110)
Creat: 1.22 mg/dL (ref 0.70–1.30)
Globulin: 2.6 g/dL (calc) (ref 1.9–3.7)
Glucose, Bld: 197 mg/dL — ABNORMAL HIGH (ref 65–139)
Potassium: 3.8 mmol/L (ref 3.5–5.3)
Sodium: 132 mmol/L — ABNORMAL LOW (ref 135–146)
Total Bilirubin: 0.7 mg/dL (ref 0.2–1.2)
Total Protein: 6.7 g/dL (ref 6.1–8.1)
eGFR: 69 mL/min/{1.73_m2} (ref >=60)

## 2021-03-24 LAB — LIPID PANEL
Cholesterol: 111 mg/dL (ref <200)
HDL: 29 mg/dL — ABNORMAL LOW (ref >=40)
LDL Cholesterol (Calc): 51 mg/dL (calc)
Non-HDL Cholesterol (Calc): 82 mg/dL (calc) (ref <130)
Total CHOL/HDL Ratio: 3.8 (calc) (ref <5.0)
Triglycerides: 261 mg/dL — ABNORMAL HIGH (ref <150)

## 2021-03-24 LAB — HEMOGLOBIN A1C
Hgb A1c MFr Bld: 8.3 % of total Hgb — ABNORMAL HIGH (ref <5.7)
Mean Plasma Glucose: 192 mg/dL
eAG (mmol/L): 10.6 mmol/L

## 2021-03-24 LAB — MICROALBUMIN / CREATININE URINE RATIO
Creatinine, Urine: 58 mg/dL (ref 20–320)
Microalb Creat Ratio: 5 mcg/mg creat (ref <30)
Microalb, Ur: 0.3 mg/dL

## 2021-03-24 LAB — PSA: PSA: 0.58 ng/mL (ref <=4.00)

## 2021-03-26 MED ORDER — INSULIN PEN NEEDLE 31G X 5 MM MISC: 0 refills | Status: DC

## 2021-03-26 MED ORDER — OZEMPIC (0.25 OR 0.5 MG/DOSE) 2 MG/1.5ML ~~LOC~~ SOPN: 0.2500 mg | PEN_INJECTOR | SUBCUTANEOUS | 3 refills | Status: DC

## 2021-03-26 MED ORDER — ROSUVASTATIN CALCIUM 10 MG PO TABS: 10.0000 mg | ORAL_TABLET | Freq: Every day | ORAL | 0 refills | Status: DC

## 2021-03-26 NOTE — Addendum Note (Signed)
Addended by: Lorre Munroe on: 03/26/2021 12:46 PM   Modules accepted: Orders

## 2021-03-31 ENCOUNTER — Ambulatory Visit: Payer: Self-pay

## 2021-03-31 NOTE — Telephone Encounter (Signed)
Will send this to the provider.  Summary: prescription change   Pharmacy called saying patients ins will not cover freestyle Libre 14 day sencor.  They would like something else sent like the [facility] & reader.   CB#  [place], [place] [address]  [place], [place] [address]  Phone:  [phone]  Fax:  [phone]    Reason for Disposition  [1] Pharmacy calling with prescription question AND [2] triager unable to answer question  Protocols used: Medication Question Call-A-AH

## 2021-04-01 ENCOUNTER — Encounter: Payer: Self-pay | Admitting: Internal Medicine

## 2021-04-01 MED ORDER — DEXCOM G7 SENSOR MISC: 1.0000 | 0 refills | Status: DC

## 2021-04-01 MED ORDER — DEXCOM G7 RECEIVER DEVI: 1.0000 | Freq: Every day | 0 refills | Status: DC

## 2021-04-01 NOTE — Telephone Encounter (Signed)
Sent Dexcom in place of [place]

## 2021-04-01 NOTE — Addendum Note (Signed)
Addended by: Lorre Munroe on: 04/01/2021 10:01 AM   Modules accepted: Orders

## 2021-04-15 ENCOUNTER — Ambulatory Visit: Payer: 59 | Admitting: Podiatry

## 2021-04-15 ENCOUNTER — Ambulatory Visit (INDEPENDENT_AMBULATORY_CARE_PROVIDER_SITE_OTHER): Payer: 59

## 2021-04-15 ENCOUNTER — Other Ambulatory Visit: Payer: Self-pay

## 2021-04-15 ENCOUNTER — Encounter: Payer: Self-pay | Admitting: Podiatry

## 2021-04-15 DIAGNOSIS — M109 Gout, unspecified: Secondary | ICD-10-CM

## 2021-04-15 DIAGNOSIS — M7751 Other enthesopathy of right foot: Secondary | ICD-10-CM

## 2021-04-15 DIAGNOSIS — M7671 Peroneal tendinitis, right leg: Secondary | ICD-10-CM

## 2021-04-15 MED ORDER — INDOMETHACIN 50 MG PO CAPS: 50.0000 mg | ORAL_CAPSULE | Freq: Two times a day (BID) | ORAL | 1 refills | Status: DC

## 2021-04-15 MED ORDER — TRIAMCINOLONE ACETONIDE 40 MG/ML IJ SUSP
20.0000 mg | Freq: Once | INTRAMUSCULAR | Status: AC
  Administered 2021-04-15: 20 mg

## 2021-04-15 NOTE — Progress Notes (Signed)
Subjective:  Patient ID: Anthony Castillo, male    DOB: 1965-03-15,  MRN: [id number] HPI Chief Complaint  Patient presents with   Ankle Pain    Lateral ankle right - aching x few weeks, swelling, occasionally rolls ankle, "but this looks worse than usual", no treatment   New Patient (Initial Visit)    56 y.o. male presents with the above complaint.   ROS: Denies fever chills nausea vomiting muscle aches pains calf pain back pain chest pain shortness of breath.  Past Medical History:  Diagnosis Date   Chronic pain 04/09/2011   DEPRESSION 03/16/2007   GERD (gastroesophageal reflux disease)    HYPERTENSION 09/22/2006   Hypogonadism male 04/09/2011   Morbid obesity (HCC) 09/22/2006   Osteoarthritis 04/09/2011   Sleep apnea    uses a cpap-will use after surgery   Wears glasses    Past Surgical History:  Procedure Laterality Date   CARPAL TUNNEL RELEASE Right 05/17/2013   Procedure: RIGHT CARPAL TUNNEL RELEASE;  Surgeon: Wyn Forster., MD;  Location: [facility];  Service: Orthopedics;  Laterality: Right;   CARPAL TUNNEL RELEASE Left 07/19/2013   Procedure: LEFT CARPAL TUNNEL RELEASE;  Surgeon: Wyn Forster., MD;  Location: [facility];  Service: Orthopedics;  Laterality: Left;   FOOT SURGERY Right 01/2015   KNEE ARTHROSCOPY  6/14   right   TONSILLECTOMY     ULNAR NERVE TRANSPOSITION Right 05/17/2013   Procedure: RIGHT ULNAR NERVE DECOMPRESSION;  Surgeon: Wyn Forster., MD;  Location: [facility];  Service: Orthopedics;  Laterality: Right;   WISDOM TOOTH EXTRACTION      Current Outpatient Medications:    indomethacin (INDOCIN) 50 MG capsule, Take 1 capsule (50 mg total) by mouth 2 (two) times daily with a meal., Disp: 60 capsule, Rfl: 1   Acetaminophen (TYLENOL ARTHRITIS PAIN PO), Take by mouth as needed., Disp: , Rfl:    amLODipine (NORVASC) 10 MG tablet, TAKE 1 TABLET BY MOUTH ONCE A DAY, Disp: 90 tablet, Rfl: 0   atorvastatin  (LIPITOR) 40 MG tablet, Take 1 tablet (40 mg total) by mouth daily., Disp: 90 tablet, Rfl: 0   augmented betamethasone dipropionate (DIPROLENE-AF) 0.05 % ointment, Apply topically., Disp: , Rfl:    buPROPion (WELLBUTRIN SR) 150 MG 12 hr tablet, , Disp: , Rfl: 0   celecoxib (CELEBREX) 100 MG capsule, TAKE 1 CAPSULE BY MOUTH TWICE DAILY, Disp: 180 capsule, Rfl: 1   cetirizine (ZYRTEC) 10 MG tablet, Take 10 mg by mouth daily., Disp: , Rfl:    Cholecalciferol (VITAMIN D3) 1000 units CAPS, Take 1 capsule by mouth daily., Disp: , Rfl:    ezetimibe (ZETIA) 10 MG tablet, TAKE 1 TABLET BY MOUTH ONCE A DAY, Disp: 90 tablet, Rfl: 0   fluocinonide cream (LIDEX) 0.05 %, as needed. , Disp: , Rfl: 1   FLUoxetine (PROZAC) 40 MG capsule, TAKE 1 CAPSULE BY MOUTH ONCE DAILY, Disp: 90 capsule, Rfl: 0   glipiZIDE (GLUCOTROL) 10 MG tablet, TAKE 1 TABLET BY MOUTH TWICE A DAY BEFORE A MEAL., Disp: 180 tablet, Rfl: 0   Insulin Pen Needle 31G X 5 MM MISC, BD Pen Needles- brand specific Inject insulin via insulin pen 6 x daily, Disp: 30 each, Rfl: 0   JANUVIA 100 MG tablet, TAKE 1 TABLET BY MOUTH ONCE A DAY, Disp: 30 tablet, Rfl: 5   lamoTRIgine (LAMICTAL) 100 MG tablet, TAKE 1 TABLET BY MOUTH EVERY MORNING, Disp: 90 tablet, Rfl: 1  lisinopril-hydrochlorothiazide (ZESTORETIC) 20-12.5 MG tablet, TAKE 2 TABLETS BY MOUTH ONCE A DAY, Disp: 180 tablet, Rfl: 3   OZEMPIC, 0.25 OR 0.5 MG/DOSE, 2 MG/1.5ML SOPN, Inject 0.25 mg into the skin once a week. For first 4 weeks. Then increase dose to 0.5mg  weekly, Disp: 1.5 mL, Rfl: 3   pantoprazole (PROTONIX) 40 MG tablet, TAKE 1 TABLET BY MOUTH ONCE A DAY, Disp: 90 tablet, Rfl: 0   potassium chloride (KLOR-CON) 10 MEQ tablet, TAKE 1 TABLET BY MOUTH ONCE A DAY, Disp: 90 tablet, Rfl: 3   rosuvastatin (CRESTOR) 10 MG tablet, Take 1 tablet (10 mg total) by mouth daily., Disp: 90 tablet, Rfl: 0   tamsulosin (FLOMAX) 0.4 MG CAPS capsule, TAKE 1 CAPSULE BY MOUTH ONCE DAILY, Disp: 30 capsule,  Rfl: 3   tizanidine (ZANAFLEX) 6 MG capsule, Take 1 capsule (6 mg total) by mouth 3 (three) times daily as needed for muscle spasms., Disp: 20 capsule, Rfl: 0  Allergies  Allergen Reactions   Penicillins     REACTION: unspecified   Review of Systems Objective:  There were no vitals filed for this visit.  General: Well developed, nourished, in no acute distress, alert and oriented x3   Dermatological: Skin is warm, dry and supple bilateral. Nails x 10 are well maintained; remaining integument appears unremarkable at this time. There are no open sores, no preulcerative lesions, no rash or signs of infection present.  Vascular: Dorsalis Pedis artery and Posterior Tibial artery pedal pulses are 2/4 bilateral with immedate capillary fill time. Pedal hair growth present. No varicosities and no lower extremity edema present bilateral.   Neruologic: Grossly intact via light touch bilateral. Vibratory intact via tuning fork bilateral. Protective threshold with Semmes Wienstein monofilament intact to all pedal sites bilateral. Patellar and Achilles deep tendon reflexes 2+ bilateral. No Babinski or clonus noted bilateral.   Musculoskeletal: No gross boney pedal deformities bilateral. No pain, crepitus, or limitation noted with foot and ankle range of motion bilateral. Muscular strength 5/5 in all groups tested bilateral.  Lateral ankle is swollen and warm to the touch.  He has decent range of motion in dorsiflexion and plantarflexion however inversion and eversion of the subtalar joint is limited as it is on the contralateral side.  The majority of his tenderness is located just beneath the lateral malleolus but does not appear to involve the peroneal tendons and that there is no pain on forced eversion or plantarflexion and eversion.  Gait: Unassisted, antalgic gait   Radiographs:  Radiographs of the right foot and ankle taken today demonstrate an osseously mature individual significant injury to  the Achilles and distal fibula in the past with present calcification there is also soft tissue swelling in the lateral ankle but the ankle joint itself appears to be relatively normal.  He does have a bit of a cavus foot with inversion of his heel most likely consistent with lateral ankle instability.  Assessment & Plan:   Assessment: Cannot rule out lateral ankle instability and gouty capsulitis possible peroneal injury.  Plan: At this point we will start him on indomethacin and request blood work consisting of a CBC and arthritic profile I also injected the area today for him with 10 mg Kenalog 5 mg Marcaine to the point of maximal tenderness.  He tolerated that well.  Follow-up with him in about a month.  Should his blood work back abnormal I will notify him immediately.     Anthony Castillo, [place]

## 2021-04-16 LAB — CBC WITH DIFFERENTIAL/PLATELET
Basophils Absolute: 0.1 10*3/uL (ref 0.0–0.2)
Basos: 1 %
EOS (ABSOLUTE): 0.1 10*3/uL (ref 0.0–0.4)
Eos: 1 %
Hematocrit: 40.3 % (ref 37.5–51.0)
Hemoglobin: 13.7 g/dL (ref 13.0–17.7)
Immature Grans (Abs): 0 10*3/uL (ref 0.0–0.1)
Immature Granulocytes: 0 %
Lymphocytes Absolute: 1.3 10*3/uL (ref 0.7–3.1)
Lymphs: 23 %
MCH: 27 pg (ref 26.6–33.0)
MCHC: 34 g/dL (ref 31.5–35.7)
MCV: 80 fL (ref 79–97)
Monocytes Absolute: 0.5 10*3/uL (ref 0.1–0.9)
Monocytes: 9 %
Neutrophils Absolute: 3.6 10*3/uL (ref 1.4–7.0)
Neutrophils: 66 %
Platelets: 229 10*3/uL (ref 150–450)
RBC: 5.07 x10E6/uL (ref 4.14–5.80)
RDW: 13.1 % (ref 11.6–15.4)
WBC: 5.5 10*3/uL (ref 3.4–10.8)

## 2021-04-16 LAB — URIC ACID: Uric Acid: 3.4 mg/dL — ABNORMAL LOW (ref 3.8–8.4)

## 2021-04-16 LAB — C-REACTIVE PROTEIN: CRP: 2 mg/L (ref 0–10)

## 2021-04-16 LAB — ANA: Anti Nuclear Antibody (ANA): NEGATIVE

## 2021-04-16 LAB — SEDIMENTATION RATE: Sed Rate: 11 mm/hr (ref 0–30)

## 2021-04-16 LAB — RHEUMATOID FACTOR: Rheumatoid fact SerPl-aCnc: <10 IU/mL (ref <14.0)

## 2021-05-06 ENCOUNTER — Other Ambulatory Visit: Payer: Self-pay | Admitting: Family

## 2021-05-06 ENCOUNTER — Other Ambulatory Visit: Payer: Self-pay | Admitting: Internal Medicine

## 2021-05-06 NOTE — Telephone Encounter (Signed)
Requested Prescriptions  ?Pending Prescriptions Disp Refills  ?? FLUoxetine (PROZAC) 40 MG capsule [Pharmacy Med Name: FLUOXETINE HCL 40 MG CAP] 90 capsule 0  ?  Sig: TAKE 1 CAPSULE BY MOUTH ONCE DAILY  ?  ? Psychiatry:  Antidepressants - SSRI Passed - 05/06/2021  9:03 AM  ?  ?  Passed - Completed PHQ-2 or PHQ-9 in the last 360 days  ?  ?  Passed - Valid encounter within last 6 months  ?  Recent Outpatient Visits   ?      ? 1 month ago Encounter for general adult medical examination with abnormal findings  ? [facility] [place], [place] W, NP  ? 4 months ago Type 2 diabetes mellitus without complication, without long-term current use of insulin (HCC)  ? [facility] [place], [place] W, NP  ? 9 months ago Type 2 diabetes mellitus without complication, without long-term current use of insulin (HCC)  ? [facility] [place], Salvadore [place], NP  ?  ?  ? ?  ?  ?  ? ? ?

## 2021-05-06 NOTE — Telephone Encounter (Signed)
Rx(s) sent to pharmacy electronically.  

## 2021-05-13 ENCOUNTER — Ambulatory Visit: Payer: 59 | Admitting: Podiatry

## 2021-05-13 ENCOUNTER — Encounter: Payer: Self-pay | Admitting: Podiatry

## 2021-05-13 ENCOUNTER — Other Ambulatory Visit: Payer: Self-pay

## 2021-05-13 DIAGNOSIS — M25371 Other instability, right ankle: Secondary | ICD-10-CM

## 2021-05-13 DIAGNOSIS — Q663 Other congenital varus deformities of feet, unspecified foot: Secondary | ICD-10-CM

## 2021-05-13 NOTE — Progress Notes (Signed)
He presents today for follow-up of his lateral ankle pain.  He states that it has subsided quite nicely at this point.  He states that he is concerned because he twists his ankle a lot and his wife states that his foot when he walks is abducted.  He states that he notices a lot of stress in his lateral ankle just about all the time.  He states that his other foot is similar.  He had surgery on his left foot by Dr. Victorino Dike in the past. ? ?Objective: Vital signs are stable he is alert and oriented times three 56 year old non-smoker but does chew tobacco pulses are palpable neurologic sensorium is intact Deetjen reflexes are intact muscle strength is normal symmetrical.  He does have some weakness in the peroneals and the lateral ankle with a cavus foot deformity.  On ambulation he does have considerable inversion of his calcaneus and there is obvious stress placed on the lateral ankle. ? 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 ? ?Plan: Discussed etiology pathology conservative versus surgical therapies with him at this point I think the lateral ankle stabilization with a calcaneal laterally displaced osteotomy would help him a great deal.  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 ? ?Once the CT is complete I will have him follow-up with either Dr. Gracy Racer or [place]. ?

## 2021-05-14 ENCOUNTER — Encounter: Payer: Self-pay | Admitting: Internal Medicine

## 2021-05-14 ENCOUNTER — Telehealth: Payer: Self-pay | Admitting: Internal Medicine

## 2021-05-14 ENCOUNTER — Ambulatory Visit (INDEPENDENT_AMBULATORY_CARE_PROVIDER_SITE_OTHER): Payer: 59 | Admitting: Internal Medicine

## 2021-05-14 VITALS — BP 164/72 | HR 85 | Temp 97.7°F | Wt 358.0 lb

## 2021-05-14 DIAGNOSIS — E11621 Type 2 diabetes mellitus with foot ulcer: Secondary | ICD-10-CM | POA: Diagnosis not present

## 2021-05-14 DIAGNOSIS — L97509 Non-pressure chronic ulcer of other part of unspecified foot with unspecified severity: Secondary | ICD-10-CM | POA: Diagnosis not present

## 2021-05-14 DIAGNOSIS — L97529 Non-pressure chronic ulcer of other part of left foot with unspecified severity: Secondary | ICD-10-CM

## 2021-05-14 MED ORDER — CEPHALEXIN 500 MG PO CAPS: 500.0000 mg | ORAL_CAPSULE | Freq: Three times a day (TID) | ORAL | 0 refills | Status: DC

## 2021-05-14 NOTE — Progress Notes (Signed)
? ?Subjective:  ? ? Patient ID: Anthony Castillo, male    DOB: 26-Jan-1966, 56 y.o.   MRN: [id number] ? ?HPI ? ?Patient presents to clinic today with complaint of an open wound of his pinky toe on his left foot.  He noticed this 2 weeks ago.  He reports he was wearing shoes that were too tight in caused a blister on his toe.  He reports the blister popped but now looks like it has turned into an ulcer.  He reports yellow drainage with an odor to the area.  He reports redness and swelling that has gotten slightly better.  He denies fever, chills, nausea or vomiting.  He has soaked his foot in Epsom salt.  He has a history of DM2. ? ?Review of Systems ? ?   ?Past Medical History:  ?Diagnosis Date  ? Chronic pain 04/09/2011  ? DEPRESSION 03/16/2007  ? GERD (gastroesophageal reflux disease)   ? HYPERTENSION 09/22/2006  ? Hypogonadism male 04/09/2011  ? Morbid obesity ([place]) 09/22/2006  ? Osteoarthritis 04/09/2011  ? Sleep apnea   ? uses a cpap-will use after surgery  ? Wears glasses   ? ? ?Current Outpatient Medications  ?Medication Sig Dispense Refill  ? Acetaminophen (TYLENOL ARTHRITIS PAIN PO) Take by mouth as needed.    ? amLODipine (NORVASC) 10 MG tablet TAKE 1 TABLET BY MOUTH ONCE A DAY 90 tablet 0  ? atorvastatin (LIPITOR) 20 MG tablet Take 1 tablet (20 mg total) by mouth daily. NEED APPOINTMENT 30 tablet 0  ? atorvastatin (LIPITOR) 40 MG tablet Take 1 tablet (40 mg total) by mouth daily. 90 tablet 0  ? augmented betamethasone dipropionate (DIPROLENE-AF) 0.05 % ointment Apply topically.    ? buPROPion (WELLBUTRIN SR) 150 MG 12 hr tablet   0  ? celecoxib (CELEBREX) 100 MG capsule TAKE 1 CAPSULE BY MOUTH TWICE DAILY 180 capsule 1  ? cetirizine (ZYRTEC) 10 MG tablet Take 10 mg by mouth daily.    ? Cholecalciferol (VITAMIN D3) 1000 units CAPS Take 1 capsule by mouth daily.    ? ezetimibe (ZETIA) 10 MG tablet TAKE 1 TABLET BY MOUTH ONCE A DAY 90 tablet 0  ? fluocinonide cream (LIDEX) 0.05 % as needed.   1  ? FLUoxetine (PROZAC) 40  MG capsule TAKE 1 CAPSULE BY MOUTH ONCE DAILY 90 capsule 0  ? glipiZIDE (GLUCOTROL) 10 MG tablet TAKE 1 TABLET BY MOUTH TWICE A DAY BEFORE A MEAL. 180 tablet 0  ? indomethacin (INDOCIN) 50 MG capsule Take 1 capsule (50 mg total) by mouth 2 (two) times daily with a meal. 60 capsule 1  ? Insulin Pen Needle 31G X 5 MM MISC BD Pen Needles- brand specific Inject insulin via insulin pen 6 x daily 30 each 0  ? JANUVIA 100 MG tablet TAKE 1 TABLET BY MOUTH ONCE A DAY 30 tablet 5  ? lamoTRIgine (LAMICTAL) 100 MG tablet TAKE 1 TABLET BY MOUTH EVERY MORNING 90 tablet 1  ? lisinopril-hydrochlorothiazide (ZESTORETIC) 20-12.5 MG tablet TAKE 2 TABLETS BY MOUTH ONCE A DAY 180 tablet 3  ? OZEMPIC, 0.25 OR 0.5 MG/DOSE, 2 MG/1.5ML SOPN Inject 0.25 mg into the skin once a week. For first 4 weeks. Then increase dose to 0.5mg  weekly 1.5 mL 3  ? pantoprazole (PROTONIX) 40 MG tablet TAKE 1 TABLET BY MOUTH ONCE A DAY 90 tablet 0  ? potassium chloride (KLOR-CON) 10 MEQ tablet TAKE 1 TABLET BY MOUTH ONCE A DAY 90 tablet 3  ? rosuvastatin (CRESTOR)  10 MG tablet Take 1 tablet (10 mg total) by mouth daily. 90 tablet 0  ? tamsulosin (FLOMAX) 0.4 MG CAPS capsule TAKE 1 CAPSULE BY MOUTH ONCE DAILY 30 capsule 3  ? tizanidine (ZANAFLEX) 6 MG capsule Take 1 capsule (6 mg total) by mouth 3 (three) times daily as needed for muscle spasms. 20 capsule 0  ? ?No current facility-administered medications for this visit.  ? ? ?Allergies  ?Allergen Reactions  ? Penicillins   ?  REACTION: unspecified  ? ? ?Family History  ?Problem Relation Age of Onset  ? Cancer Mother   ?     lymphoma  ? Heart disease Father   ? Hypertension Father   ? Diabetes Sister   ? Stroke Paternal Uncle   ? Diabetes Maternal Grandmother   ? Arthritis Paternal Grandmother   ? Heart disease Paternal Grandfather   ? Migraines Daughter   ? Irritable bowel syndrome Daughter   ? Heart Problems Son   ?     valve   ? ? ?Social History  ? ?Socioeconomic History  ? Marital status: Married  ?   Spouse name: Not on file  ? Number of children: Not on file  ? Years of education: Not on file  ? Highest education level: Not on file  ?Occupational History  ? Not on file  ?Tobacco Use  ? Smoking status: Never  ? Smokeless tobacco: Current  ?  Types: Chew  ? Tobacco comments:  ?  has been using chew 40 years  ?Vaping Use  ? Vaping Use: Never used  ?Substance and Sexual Activity  ? Alcohol use: Yes  ?  Comment: rare  ? Drug use: Never  ? Sexual activity: Yes  ?Other Topics Concern  ? Not on file  ?Social History Narrative  ? Not on file  ? ?Social Determinants of Health  ? ?Financial Resource Strain: Not on file  ?Food Insecurity: Not on file  ?Transportation Needs: Not on file  ?Physical Activity: Not on file  ?Stress: Not on file  ?Social Connections: Not on file  ?Intimate Partner Violence: Not on file  ? ? ? ?Constitutional: Denies fever, malaise, fatigue, headache or abrupt weight changes.  ?Respiratory: Denies difficulty breathing, shortness of breath, cough or sputum production.   ?Cardiovascular: Denies chest pain, chest tightness, palpitations or swelling in the hands or feet.  ?Skin: Patient reports open wound of pinky toe, left foot.  Denies rashes.  ? ? ?No other specific complaints in a complete review of systems (except as listed in HPI above). ? ?Objective:  ? Physical Exam ? ?BP (!) 164/72 (BP Location: Left Arm, Patient Position: Sitting, Cuff Size: Large)   Pulse 85   Temp 97.7 ?F (36.5 ?C) (Temporal)   Wt (!) 358 lb (162.4 kg)   SpO2 98%   BMI 48.55 kg/m?  ? ?Wt Readings from Last 3 Encounters:  ?03/23/21 (!) 357 lb 8 oz (162.2 kg)  ?12/16/20 (!) 355 lb 8 oz (161.3 kg)  ?08/05/20 (!) 349 lb 8 oz (158.5 kg)  ? ? ?General: Appears his stated age,obese, in NAD. ?Skin: Warm, dry and intact. 1 cm open ulcer noted of medial aspect, 5th toe on left foot. ?Cardiovascular: Normal rate and rhythm.  ?Pulmonary/Chest: Normal effort and positive vesicular breath sounds. No respiratory distress. No  wheezes, rales or ronchi noted.  ?Musculoskeletal: No difficulty with gait.  ?Neurological: Alert and oriented. Sensation intact to BLE. ? ? ?BMET ?   ?Component Value Date/Time  ?  NA 132 (L) 03/23/2021 1014  ? K 3.8 03/23/2021 1014  ? CL 96 (L) 03/23/2021 1014  ? CO2 30 03/23/2021 1014  ? GLUCOSE 197 (H) 03/23/2021 1014  ? BUN 12 03/23/2021 1014  ? CREATININE 1.22 03/23/2021 1014  ? CALCIUM 9.5 03/23/2021 1014  ? GFRNONAA 69 08/05/2020 0955  ? GFRAA 79 08/05/2020 0955  ? ? ?Lipid Panel  ?   ?Component Value Date/Time  ? CHOL 111 03/23/2021 1014  ? TRIG 261 (H) 03/23/2021 1014  ? HDL 29 (L) 03/23/2021 1014  ? CHOLHDL 3.8 03/23/2021 1014  ? VLDL 28.0 01/23/2020 1413  ? LDLCALC 51 03/23/2021 1014  ? ? ?CBC ?   ?Component Value Date/Time  ? WBC 5.5 04/15/2021 1024  ? WBC 5.6 03/23/2021 1014  ? RBC 5.07 04/15/2021 1024  ? RBC 5.20 03/23/2021 1014  ? HGB 13.7 04/15/2021 1024  ? HCT 40.3 04/15/2021 1024  ? PLT 229 04/15/2021 1024  ? MCV 80 04/15/2021 1024  ? MCH 27.0 04/15/2021 1024  ? MCH 26.5 (L) 03/23/2021 1014  ? MCHC 34.0 04/15/2021 1024  ? MCHC 32.2 03/23/2021 1014  ? RDW 13.1 04/15/2021 1024  ? LYMPHSABS 1.3 04/15/2021 1024  ? EOSABS 0.1 04/15/2021 1024  ? BASOSABS 0.1 04/15/2021 1024  ? ? ?Hgb A1C ?Lab Results  ?Component Value Date  ? HGBA1C 8.3 (H) 03/23/2021  ? ? ? ? ? ? ?   ?Assessment & Plan:  ? ? 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 ?Advised him to keep this area dry and free from pressure ?Rx for Keflex 500 mg 3 times daily for 7 days ?Discussed the importance of good blood sugar control ?Discussed risk of cellulitis, osteomyelitis and toe amputation is a worse case scenario ? ?Return precautions discussed ?Webb Silversmith, NP ?This visit occurred during the SARS-CoV-2 public health emergency.  Safety protocols were in place, including screening questions prior to the visit, additional usage of staff PPE, and extensive cleaning of exam room while observing appropriate contact time as indicated for  disinfecting solutions.  ? ? ?

## 2021-05-14 NOTE — Patient Instructions (Signed)
Venous Ulcer A venous ulcer is a shallow sore on your lower leg. Venous ulcer is the most common type of lower leg ulcer. You may have venous ulcers on one leg or on both legs. This condition most often develops around your ankles. This type of ulcer may last for a long time (chronic ulcer) or it may return often (recurrent ulcer). What are the causes? This condition is caused by poor blood flow in your legs. The poor flow causes blood to pool in your legs. This can break the skin, causing an ulcer. What increases the risk? You are more likely to develop this condition if: You are 58 years of age or older. You are male. You are overweight. You are not active. You have had a leg ulcer in the past. You have varicose veins. You have clots in your lower leg veins (deep vein thrombosis). You have inflammation of your leg veins (phlebitis). You have recently been pregnant. You smoke. What are the signs or symptoms? The main symptom of this condition is an open sore near your ankle. Other symptoms may include: Swelling. Thick skin. Fluid coming from the ulcer. Bleeding. Itching. Pain and swelling. This gets worse when you stand up and feels better when you raise your leg. Blotchy skin. Dark skin. How is this treated? This condition may be treated by: Keeping your leg raised (elevated). Wearing a type of bandage or stocking to keep pressure (compression) on the veins of your leg. Taking medicines, including antibiotic medicines. Cleaning your ulcer and removing any dead tissue from the wound. Using bandages and wraps that have medicines in them to cover your ulcer. Closing the wound using a piece of skin taken from another area of your body (graft). Follow these instructions at home: Medicines Take or apply over-the-counter and prescription medicines only as told by your doctor. If you were prescribed an antibiotic medicine, take it as told by your doctor. Do not stop using the  antibiotic even if you start to feel better. Ask your doctor if you should take aspirin before long trips. Wound care Follow instructions from your doctor about how to take care of your wound. Make sure you: Wash your hands with soap and water before and after you change your bandage (dressing). If you cannot use soap and water, use hand sanitizer. Change your bandage as told by your doctor. If you had a skin graft, leave stitches (sutures) in place. These may need to stay in place for 2 weeks or longer. Ask when you should remove your bandage. If your bandage is dry and sticks to your leg when you try to remove it, moisten or wet the bandage with saline solution or water to make it easier to remove. Once your bandage is off, check your wound each day for signs of infection. Have a caregiver do this for you if you are not able to do it yourself. Check for: More redness, swelling, or pain. More fluid or blood. Warmth. Pus or a bad smell. Activity Do not sit for a long time without moving. Get up to take short walks every 1-2 hours. This is important. Ask for help if you feel weak or unsteady. Ask your doctor what level of activity is safe for you. Rest with your legs raised during the day. If you can, keep your legs above the level of your heart for 30 minutes, 3-4 times a day, or as told by your doctor. Do not sit with your legs crossed. General instructions    Wear elastic stockings, compression stockings, or support hose as told by your doctor. Raise the foot of your bed as told by your doctor. Do not use any products that contain nicotine or tobacco, such as cigarettes, e-cigarettes, and chewing tobacco. If you need help quitting, ask your doctor. Keep all follow-up visits as told by your doctor. This is important. Contact a doctor if: Your ulcer is getting larger or is not healing. Your pain gets worse. Get help right away if: You have more redness, swelling, or pain around your  ulcer. You have more fluid or blood coming from your ulcer. Your ulcer feels warm to the touch. You have pus or a bad smell coming from your ulcer. You have a fever. Summary A venous ulcer is a shallow sore on your lower leg. Follow instructions from your doctor about how to take care of your wound. Check your wound each day for signs of infection. Take over-the-counter and prescription medicines only as told by your doctor. Keep all follow-up visits as told by your doctor. This is important. This information is not intended to replace advice given to you by your health care provider. Make sure you discuss any questions you have with your health care provider. Document Revised: 10/13/2017 Document Reviewed: 10/13/2017 Elsevier Patient Education  2022 Elsevier Inc.  

## 2021-05-14 NOTE — Telephone Encounter (Signed)
Pt's spouse called in wanting to know which cholesterol medicine he is supposed to be taking. Please call back ?

## 2021-05-14 NOTE — Assessment & Plan Note (Signed)
Discussed the importance of good diabetic control ?Encourage low-carb diet and exercise for weight loss ?Continue current medications at this time ?

## 2021-05-15 NOTE — Telephone Encounter (Signed)
There was a question yesterday (05/14/2021) at pt's appointment on which cholesterol medication he was taking.  At the time of the visit Mr and Anthony Castillo did not know.  Anthony Castillo called back to advise pt has been taking atorvastatin 20mg  (Prescribed by Dr. ) and rosuvastatin 10 mg (Prescribed by Mariah Milling).   ? ?Please review labs from 03/23/2021 it looks like you changed him from atorvastatin to rosuvastatin.  I advised Anthony Castillo to have Anthony Castillo stop the atorvastatin and continue with the rosuvastatin.   ? ?Thanks,  ? ?-Benjiman Core  ?

## 2021-05-15 NOTE — Telephone Encounter (Signed)
Agree with advice given

## 2021-05-27 ENCOUNTER — Other Ambulatory Visit: Payer: Self-pay | Admitting: Internal Medicine

## 2021-05-27 ENCOUNTER — Other Ambulatory Visit: Payer: Self-pay | Admitting: Family

## 2021-05-27 DIAGNOSIS — I10 Essential (primary) hypertension: Secondary | ICD-10-CM

## 2021-05-27 NOTE — Telephone Encounter (Signed)
Requested Prescriptions  ?Pending Prescriptions Disp Refills  ?? amLODipine (NORVASC) 10 MG tablet [Pharmacy Med Name: AMLODIPINE BESYLATE 10 MG TAB] 90 tablet 0  ?  Sig: TAKE 1 TABLET BY MOUTH ONCE A DAY  ?  ? Cardiovascular: Calcium Channel Blockers 2 Failed - 05/27/2021 10:56 AM  ?  ?  Failed - Last BP in normal range  ?  BP Readings from Last 1 Encounters:  ?05/14/21 (!) 164/72  ?   ?  ?  Passed - Last Heart Rate in normal range  ?  Pulse Readings from Last 1 Encounters:  ?05/14/21 85  ?   ?  ?  Passed - Valid encounter within last 6 months  ?  Recent Outpatient Visits   ?      ? 1 week ago Diabetic ulcer of left fifth toe (HCC)  ? [facility] [place], Salvadore Oxford, NP  ? 2 months ago Encounter for general adult medical examination with abnormal findings  ? [facility] [place], [place] W, NP  ? 5 months ago Type 2 diabetes mellitus without complication, without long-term current use of insulin (HCC)  ? [facility] [place], [place] W, NP  ? 9 months ago Type 2 diabetes mellitus without complication, without long-term current use of insulin (HCC)  ? [facility] [place], Salvadore Oxford, NP  ?  ?  ? ?  ?  ?  ? ? ?

## 2021-05-28 NOTE — Telephone Encounter (Signed)
Requested Prescriptions  ?Pending Prescriptions Disp Refills  ?? glipiZIDE (GLUCOTROL) 10 MG tablet [Pharmacy Med Name: GLIPIZIDE 10 MG TAB] 180 tablet 1  ?  Sig: TAKE 1 TABLET BY MOUTH TWICE A DAY BEFORE A MEAL.  ?  ? Endocrinology:  Diabetes - Sulfonylureas Failed - 05/27/2021  5:25 PM  ?  ?  Failed - HBA1C is between 0 and 7.9 and within 180 days  ?  Hgb A1c MFr Bld  ?Date Value Ref Range Status  ?03/23/2021 8.3 (H) <5.7 % of total Hgb Final  ?  Comment:  ?  For someone without known diabetes, a hemoglobin A1c ?value of 6.5% or greater indicates that they may have  ?diabetes and this should be confirmed with a follow-up  ?test. ?. ?For someone with known diabetes, a value <7% indicates  ?that their diabetes is well controlled and a value  ?greater than or equal to 7% indicates suboptimal  ?control. A1c targets should be individualized based on  ?duration of diabetes, age, comorbid conditions, and  ?other considerations. ?. ?Currently, no consensus exists regarding use of ?hemoglobin A1c for diagnosis of diabetes for children. ?. ?  ?   ?  ?  Passed - Cr in normal range and within 360 days  ?  Creat  ?Date Value Ref Range Status  ?03/23/2021 1.22 0.70 - 1.30 mg/dL Final  ? ?Creatinine, Urine  ?Date Value Ref Range Status  ?03/23/2021 58 20 - 320 mg/dL Final  ?   ?  ?  Passed - Valid encounter within last 6 months  ?  Recent Outpatient Visits   ?      ? 2 weeks ago Diabetic ulcer of left fifth toe (HCC)  ? [facility] [place], Salvadore Oxford, NP  ? 2 months ago Encounter for general adult medical examination with abnormal findings  ? [facility] [place], [place] W, NP  ? 5 months ago Type 2 diabetes mellitus without complication, without long-term current use of insulin (HCC)  ? [facility] [place], [place] W, NP  ? 9 months ago Type 2 diabetes mellitus without complication, without long-term current use of insulin (HCC)  ? [facility] [place], [place] W, NP  ?  ?  ? ?   ?  ?  ?? pantoprazole (PROTONIX) 40 MG tablet [Pharmacy Med Name: PANTOPRAZOLE SODIUM 40 MG DR TAB] 90 tablet 2  ?  Sig: TAKE 1 TABLET BY MOUTH ONCE A DAY  ?  ? Gastroenterology: Proton Pump Inhibitors Passed - 05/27/2021  5:25 PM  ?  ?  Passed - Valid encounter within last 12 months  ?  Recent Outpatient Visits   ?      ? 2 weeks ago Diabetic ulcer of left fifth toe (HCC)  ? [facility] [place], Salvadore Oxford, NP  ? 2 months ago Encounter for general adult medical examination with abnormal findings  ? [facility] [place], [place] W, NP  ? 5 months ago Type 2 diabetes mellitus without complication, without long-term current use of insulin (HCC)  ? [facility] [place], [place], NP  ? 9 months ago Type 2 diabetes mellitus without complication, without long-term current use of insulin (HCC)  ? [facility] [place], Salvadore Oxford, NP  ?  ?  ? ?  ?  ?  ? ?

## 2021-06-03 ENCOUNTER — Other Ambulatory Visit: Payer: Self-pay | Admitting: Internal Medicine

## 2021-06-04 NOTE — Telephone Encounter (Signed)
Requested Prescriptions  ?Pending Prescriptions Disp Refills  ?? tamsulosin (FLOMAX) 0.4 MG CAPS capsule [Pharmacy Med Name: TAMSULOSIN HCL 0.4 MG CAP] 90 capsule 1  ?  Sig: TAKE 1 CAPSULE BY MOUTH ONCE DAILY  ?  ? Urology: Alpha-Adrenergic Blocker Failed - 06/03/2021 10:28 AM  ?  ?  Failed - Last BP in normal range  ?  BP Readings from Last 1 Encounters:  ?05/14/21 (!) 164/72  ?   ?  ?  Passed - PSA in normal range and within 360 days  ?  PSA  ?Date Value Ref Range Status  ?03/23/2021 0.58 < OR = 4.00 ng/mL Final  ?  Comment:  ?  The total PSA value from this assay system is  ?standardized against the WHO standard. The test  ?result will be approximately 20% lower when compared  ?to the equimolar-standardized total PSA (Beckman  ?Coulter). Comparison of serial PSA results should be  ?interpreted with this fact in mind. ?. ?This test was performed using the Siemens  ?chemiluminescent method. Values obtained from  ?different assay methods cannot be used ?interchangeably. PSA levels, regardless of ?value, should not be interpreted as absolute ?evidence of the presence or absence of disease. ?  ?   ?  ?  Passed - Valid encounter within last 12 months  ?  Recent Outpatient Visits   ?      ? 3 weeks ago Diabetic ulcer of left fifth toe ([place])  ? [facility] [place], Coralie Keens, NP  ? 2 months ago Encounter for general adult medical examination with abnormal findings  ? [facility] [place], [place] W, NP  ? 5 months ago Type 2 diabetes mellitus without complication, without long-term current use of insulin ([place])  ? [facility] [place], [place] W, NP  ? 10 months ago Type 2 diabetes mellitus without complication, without long-term current use of insulin ([place])  ? [facility] [place], Coralie Keens, NP  ?  ?  ? ?  ?  ?  ? ? ?

## 2021-06-10 ENCOUNTER — Telehealth: Payer: Self-pay | Admitting: Internal Medicine

## 2021-06-10 NOTE — Telephone Encounter (Signed)
[facility] called, spoke to [place], [facility] who needed clarification on whether the pt should still be taking the Ozempic and the Januvia. He states when they process the medications with his insurance it is saying he should be taking 1 or the other. I advised him I will send to provider for review and f/up back up.  ? ?Pt called, asked pt if he was taking 0.25mg  or 0.5mg  of the Ozempic. Pt states he is taking the 0.5mg  and taking Januvia 100mg  QD. Advised him I will call the pharmacy and let them know this information.  ? ?Pharmacy called in for clarification. would like to confirm if pt is currently taking  ?  ?OZEMPIC, 0.25 OR 0.5 MG/DOSE, 2 MG/1.5ML SOPN  and JANUVIA 100 MG tablet  ?  ?  ?  ?They are showing both in pt's chart.  ?  ?  ?336) Thayer Ohm- please advise.  ?

## 2021-06-10 NOTE — Telephone Encounter (Signed)
Pharmacy called in for clarification. Thayer Ohm would like to confirm if pt is currently taking  ? ?OZEMPIC, 0.25 OR 0.5 MG/DOSE, 2 MG/1.5ML SOPN  and JANUVIA 100 MG tablet  ? ? ? ?They are showing both in pt's chart.  ? ? ?336) [id number]- please advise.  ?

## 2021-06-11 ENCOUNTER — Other Ambulatory Visit: Payer: Self-pay

## 2021-06-11 NOTE — Telephone Encounter (Signed)
Stop Januvia, continue Ozempic ?

## 2021-06-11 NOTE — Telephone Encounter (Signed)
Thayer Ohm from [facility] pharmacy advised.  I also called Mr. Eifler to advise him to stop Januvia. ? ?Thanks,  ? ?-Vernona Rieger  ?

## 2021-06-24 ENCOUNTER — Other Ambulatory Visit: Payer: Self-pay

## 2021-06-24 DIAGNOSIS — M25371 Other instability, right ankle: Secondary | ICD-10-CM

## 2021-06-25 ENCOUNTER — Telehealth: Payer: Self-pay

## 2021-06-25 NOTE — Telephone Encounter (Signed)
PA auth form and office notes have been faxed to [employer] for CT scan. ?

## 2021-07-01 ENCOUNTER — Other Ambulatory Visit: Payer: Self-pay | Admitting: Internal Medicine

## 2021-07-01 NOTE — Telephone Encounter (Signed)
Requested Prescriptions  ?Pending Prescriptions Disp Refills  ?? rosuvastatin (CRESTOR) 10 MG tablet [Pharmacy Med Name: ROSUVASTATIN CALCIUM 10 MG TAB] 90 tablet 0  ?  Sig: TAKE 1 TABLET BY MOUTH ONCE A DAY  ?  ? Cardiovascular:  Antilipid - Statins 2 Failed - 07/01/2021  9:49 AM  ?  ?  Failed - Lipid Panel in normal range within the last 12 months  ?  Cholesterol  ?Date Value Ref Range Status  ?03/23/2021 111 <200 mg/dL Final  ? ?LDL Cholesterol (Calc)  ?Date Value Ref Range Status  ?03/23/2021 51 mg/dL (calc) Final  ?  Comment:  ?  Reference range: <100 ?Marland Kitchen ?Desirable range <100 mg/dL for primary prevention;   ?<70 mg/dL for patients with CHD or diabetic patients  ?with > or = 2 CHD risk factors. ?. ?LDL-C is now calculated using the Martin-Hopkins  ?calculation, which is a validated novel method providing  ?better accuracy than the Friedewald equation in the  ?estimation of LDL-C.  ?Horald Pollen et al. Lenox Ahr. 6945;038(88): 2061-2068  ?(http://education.[employer].com/faq/FAQ164) ?  ? ?Direct LDL  ?Date Value Ref Range Status  ?08/23/2017 59.0 mg/dL Final  ?  Comment:  ?  Optimal:  <100 mg/dLNear or Above Optimal:  100-129 mg/dLBorderline High:  130-159 mg/dLHigh:  160-189 mg/dLVery High:  >190 mg/dL  ? ?HDL  ?Date Value Ref Range Status  ?03/23/2021 29 (L) > OR = 40 mg/dL Final  ? ?Triglycerides  ?Date Value Ref Range Status  ?03/23/2021 261 (H) <150 mg/dL Final  ?  Comment:  ?  . ?If a non-fasting specimen was collected, consider ?repeat triglyceride testing on a fasting specimen ?if clinically indicated.  ?Henrene Pastor al. J. of Clin. Lipidol. 2015;9:129-169. ?. ?  ? ?  ?  ?  Passed - Cr in normal range and within 360 days  ?  Creat  ?Date Value Ref Range Status  ?03/23/2021 1.22 0.70 - 1.30 mg/dL Final  ? ?Creatinine, Urine  ?Date Value Ref Range Status  ?03/23/2021 58 20 - 320 mg/dL Final  ?   ?  ?  Passed - Patient is not pregnant  ?  ?  Passed - Valid encounter within last 12 months  ?  Recent Outpatient  Visits   ?      ? 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 [facility] [place], Salvadore Oxford, NP  ? 3 months ago Encounter for general adult medical examination with abnormal findings  ? [facility] [place], [place] W, NP  ? 6 months ago Type 2 diabetes mellitus without complication, without long-term current use of insulin (HCC)  ? [facility] [place], [place] W, NP  ? 11 months ago Type 2 diabetes mellitus without complication, without long-term current use of insulin (HCC)  ? [facility] [place], Salvadore Oxford, NP  ?  ?  ? ?  ?  ?  ? ?

## 2021-07-06 ENCOUNTER — Other Ambulatory Visit: Payer: Self-pay | Admitting: Internal Medicine

## 2021-07-07 NOTE — Telephone Encounter (Signed)
Requested medication (s) are due for refill today - yes ? ?Requested medication (s) are on the active medication list -yes ? ?Future visit scheduled -no ? ?Last refill: not sure -Rx 03/26/21 1.27ml 3RF, 05/14/21- reported not taking- but that may have changed per pharmacy call in chart ? ?Notes to clinic: No protocol assigned ? ?Requested Prescriptions  ?Pending Prescriptions Disp Refills  ? OZEMPIC, 0.25 OR 0.5 MG/DOSE, 2 MG/3ML SOPN [Pharmacy Med Name: OZEMPIC (0.25 OR 0.5 MG/DOSE) 2 MG/] 1.5 mL   ?  Sig: INJECT 0.25 MG INTO THE SKIN ONCE A WEEKFOR FIRST 4 WEEKS. THEN INCREASE DOSE TO0.5MG  WEEKLY  ?  ? There is no refill protocol information for this order  ?  ? ? ? ?Requested Prescriptions  ?Pending Prescriptions Disp Refills  ? OZEMPIC, 0.25 OR 0.5 MG/DOSE, 2 MG/3ML SOPN [Pharmacy Med Name: OZEMPIC (0.25 OR 0.5 MG/DOSE) 2 MG/] 1.5 mL   ?  Sig: INJECT 0.25 MG INTO THE SKIN ONCE A WEEKFOR FIRST 4 WEEKS. THEN INCREASE DOSE TO0.5MG  WEEKLY  ?  ? There is no refill protocol information for this order  ?  ? ? ? ?

## 2021-08-04 ENCOUNTER — Other Ambulatory Visit: Payer: Self-pay | Admitting: Cardiovascular Disease

## 2021-08-04 ENCOUNTER — Other Ambulatory Visit: Payer: Self-pay | Admitting: Internal Medicine

## 2021-08-04 DIAGNOSIS — M2550 Pain in unspecified joint: Secondary | ICD-10-CM

## 2021-08-04 DIAGNOSIS — E782 Mixed hyperlipidemia: Secondary | ICD-10-CM

## 2021-08-04 DIAGNOSIS — Z8249 Family history of ischemic heart disease and other diseases of the circulatory system: Secondary | ICD-10-CM

## 2021-08-04 DIAGNOSIS — M791 Myalgia, unspecified site: Secondary | ICD-10-CM

## 2021-08-04 NOTE — Telephone Encounter (Signed)
Requested Prescriptions  Pending Prescriptions Disp Refills  . FLUoxetine (PROZAC) 40 MG capsule [Pharmacy Med Name: FLUOXETINE HCL 40 MG CAP] 90 capsule 0    Sig: TAKE 1 CAPSULE BY MOUTH ONCE DAILY     Psychiatry:  Antidepressants - SSRI Passed - 08/04/2021  1:39 PM      Passed - Completed PHQ-2 or PHQ-9 in the last 360 days      Passed - Valid encounter within last 6 months    Recent Outpatient Visits          2 months ago Diabetic ulcer of left fifth toe [facility])   [facility], Coralie Keens, NP   4 months ago Encounter for general adult medical examination with abnormal findings   [facility] [place], Coralie Keens, NP   7 months ago Type 2 diabetes mellitus without complication, without long-term current use of insulin [facility])   [facility] [place], Coralie Keens, NP   12 months ago Type 2 diabetes mellitus without complication, without long-term current use of insulin ([place])   [facility] [place], Coralie Keens, NP      Future Appointments            In 1 month Gollan, Kathlene November, MD [facility], [facility]           . celecoxib (CELEBREX) 100 MG capsule [Pharmacy Med Name: CELECOXIB 100 MG CAP] 180 capsule 1    Sig: TAKE 1 CAPSULE BY MOUTH TWICE DAILY     Analgesics:  COX2 Inhibitors Failed - 08/04/2021  1:39 PM      Failed - Manual Review: Labs are only required if the patient has taken medication for more than 8 weeks.      Passed - HGB in normal range and within 360 days    Hemoglobin  Date Value Ref Range Status  04/15/2021 13.7 13.0 - 17.7 g/dL Final         Passed - Cr in normal range and within 360 days    Creat  Date Value Ref Range Status  03/23/2021 1.22 0.70 - 1.30 mg/dL Final   Creatinine, Urine  Date Value Ref Range Status  03/23/2021 58 20 - 320 mg/dL Final         Passed - HCT in normal range and within 360 days    Hematocrit  Date Value Ref Range Status  04/15/2021 40.3 37.5 - 51.0 % Final          Passed - AST in normal range and within 360 days    AST  Date Value Ref Range Status  03/23/2021 16 10 - 35 U/L Final         Passed - ALT in normal range and within 360 days    ALT  Date Value Ref Range Status  03/23/2021 22 9 - 46 U/L Final         Passed - eGFR is 30 or above and within 360 days    GFR, Est African American  Date Value Ref Range Status  08/05/2020 79 > OR = 60 mL/min/1.20m Final   GFR, Est Non African American  Date Value Ref Range Status  08/05/2020 69 > OR = 60 mL/min/1.739mFinal   GFR  Date Value Ref Range Status  01/23/2020 67.67 >60.00 mL/min Final    Comment:    Calculated using the CKD-EPI Creatinine Equation (2021)   eGFR  Date Value Ref Range Status  03/23/2021 69 >  OR = 60 mL/min/1.75m Final    Comment:    The eGFR is based on the CKD-EPI 2021 equation. To calculate  the new eGFR from a previous Creatinine or Cystatin C result, go to https://www.kidney.org/professionals/ kdoqi/gfr%5Fcalculator          Passed - Patient is not pregnant      Passed - Valid encounter within last 12 months    Recent Outpatient Visits          2 months ago Diabetic ulcer of left fifth toe ([facility]   [facility]   4 months ago Encounter for general adult medical examination with abnormal findings   [facility]   7 months ago Type 2 diabetes mellitus without complication, without long-term current use of insulin ([facility]   [facility]   12 months ago Type 2 diabetes mellitus without complication, without long-term current use of insulin ([place]   [facility]      Future Appointments            In 1 month Gollan, [facility]lene November [facility]           . lamoTRIgine (LAMICTAL) 100 MG tablet [Pharmacy Med Name: LAMOTRIGINE 100 MG TAB] 90 tablet 1    Sig: TAKE 1 TABLET BY MOUTH  EVERY MORNING     Neurology:  Anticonvulsants - lamotrigine Passed - 08/04/2021  1:39 PM      Passed - Cr in normal range and within 360 days    Creat  Date Value Ref Range Status  03/23/2021 1.22 0.70 - 1.30 mg/dL Final   Creatinine, Urine  Date Value Ref Range Status  03/23/2021 58 20 - 320 mg/dL Final         Passed - ALT in normal range and within 360 days    ALT  Date Value Ref Range Status  03/23/2021 22 9 - 46 U/L Final         Passed - AST in normal range and within 360 days    AST  Date Value Ref Range Status  03/23/2021 16 10 - 35 U/L Final         Passed - Completed PHQ-2 or PHQ-9 in the last 360 days      Passed - Valid encounter within last 12 months    Recent Outpatient Visits          2 months ago Diabetic ulcer of left fifth toe ([facility]   [facility]   4 months ago Encounter for general adult medical examination with abnormal findings   [facility]   7 months ago Type 2 diabetes mellitus without complication, without long-term current use of insulin ([facility]   [facility]   12 months ago Type 2 diabetes mellitus without complication, without long-term current use of insulin ([place]   [facility]      Future Appointments            In 1 month Gollan, TKathlene November [facility]

## 2021-08-24 ENCOUNTER — Other Ambulatory Visit: Payer: Self-pay | Admitting: Cardiovascular Disease

## 2021-08-24 DIAGNOSIS — I10 Essential (primary) hypertension: Secondary | ICD-10-CM

## 2021-08-26 ENCOUNTER — Other Ambulatory Visit: Payer: Self-pay | Admitting: Cardiovascular Disease

## 2021-08-26 ENCOUNTER — Other Ambulatory Visit: Payer: Self-pay | Admitting: Internal Medicine

## 2021-08-26 DIAGNOSIS — Z8249 Family history of ischemic heart disease and other diseases of the circulatory system: Secondary | ICD-10-CM

## 2021-08-26 DIAGNOSIS — I10 Essential (primary) hypertension: Secondary | ICD-10-CM

## 2021-08-26 DIAGNOSIS — E782 Mixed hyperlipidemia: Secondary | ICD-10-CM

## 2021-08-26 NOTE — Telephone Encounter (Signed)
Requested Prescriptions  Pending Prescriptions Disp Refills  . amLODipine (NORVASC) 10 MG tablet [Pharmacy Med Name: AMLODIPINE BESYLATE 10 MG TAB] 90 tablet 0    Sig: TAKE 1 TABLET BY MOUTH ONCE A DAY     Cardiovascular: Calcium Channel Blockers 2 Failed - 08/26/2021  8:47 AM      Failed - Last BP in normal range    BP Readings from Last 1 Encounters:  05/14/21 (!) 164/72         Passed - Last Heart Rate in normal range    Pulse Readings from Last 1 Encounters:  05/14/21 85         Passed - Valid encounter within last 6 months    Recent Outpatient Visits          3 months ago Diabetic ulcer of left fifth toe [facility])   [facility], Salvadore Oxford, NP   5 months ago Encounter for general adult medical examination with abnormal findings   [facility] [place], Salvadore Oxford, NP   8 months ago Type 2 diabetes mellitus without complication, without long-term current use of insulin [facility])   [facility] [place], Salvadore Oxford, NP   1 year ago Type 2 diabetes mellitus without complication, without long-term current use of insulin (HCC)   [facility], Salvadore Oxford, NP      Future Appointments            In 1 week Gollan, Tollie Pizza, MD [facility], [facility]

## 2021-08-31 ENCOUNTER — Encounter: Payer: Self-pay | Admitting: *Deleted

## 2021-08-31 DIAGNOSIS — Z006 Encounter for examination for normal comparison and control in clinical research program: Secondary | ICD-10-CM

## 2021-08-31 NOTE — Research (Signed)
Spoke to Mrs. Rosas about Haematologist. She states Mr Keenum will not be able to be in study due to his work and this is a busy time of year.

## 2021-09-07 NOTE — Progress Notes (Unsigned)
Cardiology Office Note  Date:  09/08/2021   ID:  EYAN HAGOOD, DOB 30-Sep-1965, MRN [id number]  PCP:  Lorre Munroe, NP   Chief Complaint  Patient presents with   Other    12 month f/u c/o sob with exertion. Meds reviewed verbally with pt.    HPI:  [name] is a 56 year old gentleman with history of Morbid obesity Nonsmoker, dips Chronic shortness of breath Hyperlipidemia Hypertension CT coronary calcium scoring discussed, score 103 Sleep apnea Osteoarthritis Who presents for follow-up of his chest pain, shortness of breath, leg swelling Calcium score 103 in February 2020  Last seen by myself in clinic February 2022  In follow-up today reports doing well Continued mild shortness of breath on exertion, no different from before Feels that is from weight and conditioning  On Ozempic low-dose Weight continues to run high, diabetes A1c greater than 8  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, very busy at baseline  Occasionally reports having a cough like something gets stuck, this does not happen on a regular basis  Prior imaging reviewed CT coronary calcium scoring discussed, score 103 Fatty liver also noted  Total cholesterol at goal tolerating Lipitor 20 mg daily Labs reviewed Total cholesterol 112, LDL 46  EKG personally reviewed by myself on todays visit NSR rate 77 bpm, no ST or T wave changes  Other past medical history reviewed  chronic shortness of breath for over the past year or more No regular exercise program,   PMH:   has a past medical history of Chronic pain (04/09/2011), DEPRESSION (03/16/2007), GERD (gastroesophageal reflux disease), HYPERTENSION (09/22/2006), Hypogonadism male (04/09/2011), Morbid obesity (HCC) (09/22/2006), Osteoarthritis (04/09/2011), Sleep apnea, and Wears glasses.  PSH:    Past Surgical History:  Procedure Laterality Date   CARPAL TUNNEL RELEASE Right 05/17/2013   Procedure: RIGHT CARPAL TUNNEL RELEASE;  Surgeon: Wyn Forster., MD;  Location: [facility];  Service: Orthopedics;  Laterality: Right;   CARPAL TUNNEL RELEASE Left 07/19/2013   Procedure: LEFT CARPAL TUNNEL RELEASE;  Surgeon: Wyn Forster., MD;  Location: [facility];  Service: Orthopedics;  Laterality: Left;   FOOT SURGERY Right 01/2015   KNEE ARTHROSCOPY  6/14   right   TONSILLECTOMY     ULNAR NERVE TRANSPOSITION Right 05/17/2013   Procedure: RIGHT ULNAR NERVE DECOMPRESSION;  Surgeon: Wyn Forster., MD;  Location: [facility];  Service: Orthopedics;  Laterality: Right;   WISDOM TOOTH EXTRACTION      Current Outpatient Medications  Medication Sig Dispense Refill   Acetaminophen (TYLENOL ARTHRITIS PAIN PO) Take by mouth as needed.     amLODipine (NORVASC) 10 MG tablet TAKE 1 TABLET BY MOUTH ONCE A DAY 90 tablet 0   augmented betamethasone dipropionate (DIPROLENE-AF) 0.05 % ointment Apply topically.     buPROPion (WELLBUTRIN SR) 150 MG 12 hr tablet 150 mg daily.  0   celecoxib (CELEBREX) 100 MG capsule TAKE 1 CAPSULE BY MOUTH TWICE DAILY 180 capsule 0   cephALEXin (KEFLEX) 500 MG capsule Take 1 capsule (500 mg total) by mouth 3 (three) times daily. 30 capsule 0   cetirizine (ZYRTEC) 10 MG tablet Take 10 mg by mouth daily.     Cholecalciferol (VITAMIN D3) 1000 units CAPS Take 1 capsule by mouth daily.     ezetimibe (ZETIA) 10 MG tablet TAKE 1 TABLET BY MOUTH ONCE A DAY 30 tablet 0   fluocinonide cream (LIDEX) 0.05 % as needed.   1  FLUoxetine (PROZAC) 40 MG capsule TAKE 1 CAPSULE BY MOUTH ONCE DAILY 90 capsule 0   glipiZIDE (GLUCOTROL) 10 MG tablet TAKE 1 TABLET BY MOUTH TWICE A DAY BEFORE A MEAL. 180 tablet 1   indomethacin (INDOCIN) 50 MG capsule Take 1 capsule (50 mg total) by mouth 2 (two) times daily with a meal. 60 capsule 1   Insulin Pen Needle 31G X 5 MM MISC BD Pen Needles- brand specific Inject insulin via insulin pen 6 x daily 30 each 0   lamoTRIgine (LAMICTAL) 100 MG tablet TAKE 1  TABLET BY MOUTH EVERY MORNING 90 tablet 0   lisinopril-hydrochlorothiazide (ZESTORETIC) 20-12.5 MG tablet TAKE 2 TABLETS BY MOUTH ONCE A DAY 30 tablet 0   OZEMPIC, 0.25 OR 0.5 MG/DOSE, 2 MG/3ML SOPN INJECT 0.25 MG INTO THE SKIN ONCE A WEEKFOR FIRST 4 WEEKS. THEN INCREASE DOSE TO0.5MG  WEEKLY 1.5 mL 0   pantoprazole (PROTONIX) 40 MG tablet TAKE 1 TABLET BY MOUTH ONCE A DAY 90 tablet 2   potassium chloride (KLOR-CON) 10 MEQ tablet TAKE 1 TABLET BY MOUTH ONCE A DAY (NEED OFFICE VISIT FOR REFILLS) 30 tablet 0   rosuvastatin (CRESTOR) 10 MG tablet TAKE 1 TABLET BY MOUTH ONCE A DAY 90 tablet 0   tamsulosin (FLOMAX) 0.4 MG CAPS capsule TAKE 1 CAPSULE BY MOUTH ONCE DAILY 90 capsule 1   tizanidine (ZANAFLEX) 6 MG capsule Take 1 capsule (6 mg total) by mouth 3 (three) times daily as needed for muscle spasms. 20 capsule 0   No current facility-administered medications for this visit.     Allergies:   Penicillins   Social History:  The patient  reports that he has never smoked. His smokeless tobacco use includes chew. He reports current alcohol use. He reports that he does not use drugs.   Family History:   family history includes Arthritis in his paternal grandmother; Cancer in his mother; Diabetes in his maternal grandmother and sister; Heart Problems in his son; Heart disease in his father and paternal grandfather; Hypertension in his father; Irritable bowel syndrome in his daughter; Migraines in his daughter; Stroke in his paternal uncle.    Review of Systems: Review of Systems  Constitutional: Negative.   HENT: Negative.    Respiratory:  Positive for shortness of breath.   Cardiovascular:  Positive for leg swelling.  Gastrointestinal: Negative.   Musculoskeletal: Negative.   Neurological: Negative.   Psychiatric/Behavioral: Negative.    All other systems reviewed and are negative.   PHYSICAL EXAM: VS:  BP 138/74 (BP Location: Left Arm, Patient Position: Sitting, Cuff Size: Large)   Pulse  77   Ht 6' (1.829 m)   Wt (!) 350 lb (158.8 kg)   SpO2 98%   BMI 47.47 kg/m  , BMI Body mass index is 47.47 kg/m. GEN: Well nourished, well developed, in no acute distress  HEENT: normal  Neck: no JVD, carotid bruits, or masses Cardiac: RRR; no murmurs, rubs, or gallops,no edema  Respiratory:  clear to auscultation bilaterally, normal work of breathing GI: soft, nontender, nondistended, + BS MS: no deformity or atrophy  Skin: warm and dry, no rash Neuro:  Strength and sensation are intact Psych: euthymic mood, full affect  Recent Labs: 03/23/2021: ALT 22; BUN 12; Creat 1.22; Potassium 3.8; Sodium 132 04/15/2021: Hemoglobin 13.7; Platelets 229    Lipid Panel Lab Results  Component Value Date   CHOL 111 03/23/2021   HDL 29 (L) 03/23/2021   LDLCALC 51 03/23/2021   TRIG 261 (H) 03/23/2021  Wt Readings from Last 3 Encounters:  09/08/21 (!) 350 lb (158.8 kg)  05/14/21 (!) 358 lb (162.4 kg)  03/23/21 (!) 357 lb 8 oz (162.2 kg)      ASSESSMENT AND PLAN:  Type 2 diabetes poor control On Ozempic, A1c greater than 8 We have encouraged continued exercise, careful diet management in an effort to lose weight.  Chest pain with moderate risk for cardiac etiology -  No recent chest pain symptoms Denies angina Low calcium score of 100   Lower extremity edema Lower extremity edema likely venous issue, no pitting edema Exacerbated by weight Will avoid calcium channel blockers,  No change on today's visit  shortness of breath -  Deconditioning, morbid obesity likely contributing Does lots of walking at work On Ozempic for weight  Hypertension Blood pressure is well controlled on today's visit. No changes made to the medications.    Total encounter time more than 30 minutes  Greater than 50% was spent in counseling and coordination of care with the patient   No orders of the defined types were placed in this encounter.    Signed, Dossie Arbour, M.D.,  Ph.D. 09/08/2021  [facility] [place], [place] [phone]

## 2021-09-08 ENCOUNTER — Encounter: Payer: Self-pay | Admitting: Cardiovascular Disease

## 2021-09-08 ENCOUNTER — Ambulatory Visit: Payer: 59 | Admitting: Cardiovascular Disease

## 2021-09-08 VITALS — BP 138/74 | HR 77 | Ht 72.0 in | Wt 350.0 lb

## 2021-09-08 DIAGNOSIS — E782 Mixed hyperlipidemia: Secondary | ICD-10-CM | POA: Diagnosis not present

## 2021-09-08 DIAGNOSIS — I10 Essential (primary) hypertension: Secondary | ICD-10-CM

## 2021-09-08 DIAGNOSIS — E1165 Type 2 diabetes mellitus with hyperglycemia: Secondary | ICD-10-CM | POA: Diagnosis not present

## 2021-09-08 DIAGNOSIS — Z8249 Family history of ischemic heart disease and other diseases of the circulatory system: Secondary | ICD-10-CM

## 2021-09-08 DIAGNOSIS — R600 Localized edema: Secondary | ICD-10-CM

## 2021-09-08 MED ORDER — LISINOPRIL-HYDROCHLOROTHIAZIDE 20-12.5 MG PO TABS: 2.0000 | ORAL_TABLET | Freq: Every day | ORAL | 2 refills | Status: DC

## 2021-09-08 MED ORDER — EZETIMIBE 10 MG PO TABS: 10.0000 mg | ORAL_TABLET | Freq: Every day | ORAL | 2 refills | Status: DC

## 2021-09-08 MED ORDER — ROSUVASTATIN CALCIUM 10 MG PO TABS: 10.0000 mg | ORAL_TABLET | Freq: Every day | ORAL | 2 refills | Status: DC

## 2021-09-08 MED ORDER — AMLODIPINE BESYLATE 10 MG PO TABS: 10.0000 mg | ORAL_TABLET | Freq: Every day | ORAL | 2 refills | Status: DC

## 2021-09-08 MED ORDER — POTASSIUM CHLORIDE ER 10 MEQ PO TBCR: EXTENDED_RELEASE_TABLET | ORAL | 2 refills | Status: DC

## 2021-09-08 NOTE — Patient Instructions (Signed)
Medication Instructions:  No changes  If you need a refill on your cardiac medications before your next appointment, please call your pharmacy.   Lab work: No new labs needed  Testing/Procedures: No new testing needed  Follow-Up: At [facility], you and your health needs are our priority.  As part of our continuing mission to provide you with exceptional heart care, we have created designated Provider Care Teams.  These Care Teams include your primary Cardiologist (physician) and Advanced Practice Providers (APPs -  Physician Assistants and Nurse Practitioners) who all work together to provide you with the care you need, when you need it.  You will need a follow up appointment in 12 months  Providers on your designated Care Team:   Christopher Berge, NP Ryan Dunn, PA-C Cadence Furth, PA-C  COVID-19 Vaccine Information can be found at: [email or web address] For questions related to vaccine distribution or appointments, please email [email or web address] or call [phone].   

## 2021-11-10 ENCOUNTER — Other Ambulatory Visit: Payer: Self-pay | Admitting: Internal Medicine

## 2021-11-10 DIAGNOSIS — M2550 Pain in unspecified joint: Secondary | ICD-10-CM

## 2021-11-10 DIAGNOSIS — M791 Myalgia, unspecified site: Secondary | ICD-10-CM

## 2021-11-11 NOTE — Telephone Encounter (Signed)
Requested Prescriptions  Pending Prescriptions Disp Refills  . lamoTRIgine (LAMICTAL) 100 MG tablet [Pharmacy Med Name: LAMOTRIGINE 100 MG TAB] 90 tablet 0    Sig: TAKE 1 TABLET BY MOUTH EVERY MORNING     Neurology:  Anticonvulsants - lamotrigine Passed - 11/10/2021 10:42 AM      Passed - Cr in normal range and within 360 days    Creat  Date Value Ref Range Status  03/23/2021 1.22 0.70 - 1.30 mg/dL Final   Creatinine, Urine  Date Value Ref Range Status  03/23/2021 58 20 - 320 mg/dL Final         Passed - ALT in normal range and within 360 days    ALT  Date Value Ref Range Status  03/23/2021 22 9 - 46 U/L Final         Passed - AST in normal range and within 360 days    AST  Date Value Ref Range Status  03/23/2021 16 10 - 35 U/L Final         Passed - Completed PHQ-2 or PHQ-9 in the last 360 days      Passed - Valid encounter within last 12 months    Recent Outpatient Visits          6 months ago Diabetic ulcer of left fifth toe [facility])   [facility], Coralie Keens, NP   7 months ago Encounter for general adult medical examination with abnormal findings   [facility] [place], [place] W, NP   11 months ago Type 2 diabetes mellitus without complication, without long-term current use of insulin ([place])   [facility] [place], [place] W, NP   1 year ago Type 2 diabetes mellitus without complication, without long-term current use of insulin ([place])   [facility] [place], [place] W, NP             . celecoxib (CELEBREX) 100 MG capsule [Pharmacy Med Name: CELECOXIB 100 MG CAP] 180 capsule 0    Sig: TAKE 1 CAPSULE BY MOUTH TWICE DAILY     Analgesics:  COX2 Inhibitors Failed - 11/10/2021 10:42 AM      Failed - Manual Review: Labs are only required if the patient has taken medication for more than 8 weeks.      Passed - HGB in normal range and within 360 days    Hemoglobin  Date Value Ref Range Status  04/15/2021 13.7 13.0  - 17.7 g/dL Final         Passed - Cr in normal range and within 360 days    Creat  Date Value Ref Range Status  03/23/2021 1.22 0.70 - 1.30 mg/dL Final   Creatinine, Urine  Date Value Ref Range Status  03/23/2021 58 20 - 320 mg/dL Final         Passed - HCT in normal range and within 360 days    Hematocrit  Date Value Ref Range Status  04/15/2021 40.3 37.5 - 51.0 % Final         Passed - AST in normal range and within 360 days    AST  Date Value Ref Range Status  03/23/2021 16 10 - 35 U/L Final         Passed - ALT in normal range and within 360 days    ALT  Date Value Ref Range Status  03/23/2021 22 9 - 46 U/L Final         Passed -  eGFR is 30 or above and within 360 days    GFR, Est African American  Date Value Ref Range Status  08/05/2020 79 > OR = 60 mL/min/1.58m Final   GFR, Est Non African American  Date Value Ref Range Status  08/05/2020 69 > OR = 60 mL/min/1.781mFinal   GFR  Date Value Ref Range Status  01/23/2020 67.67 >60.00 mL/min Final    Comment:    Calculated using the CKD-EPI Creatinine Equation (2021)   eGFR  Date Value Ref Range Status  03/23/2021 69 > OR = 60 mL/min/1.7374minal    Comment:    The eGFR is based on the CKD-EPI 2021 equation. To calculate  the new eGFR from a previous Creatinine or Cystatin C result, go to https://www.kidney.org/professionals/ kdoqi/gfr%5Fcalculator          Passed - Patient is not pregnant      Passed - Valid encounter within last 12 months    Recent Outpatient Visits          6 months ago [facility]   7 months ago Encounter for general adult medical examination with abnormal findings   [facility]   11 months ago Type 2 diabetes mellitus without complication, without long-term current use of insulin [facility]   1 year ago Type 2 diabetes  mellitus without complication, without long-term current use of insulin ([facility]

## 2021-11-16 ENCOUNTER — Other Ambulatory Visit: Payer: Self-pay | Admitting: Internal Medicine

## 2021-11-17 NOTE — Telephone Encounter (Signed)
Sent pt message to make appointment via MyChart message. 30 day courtesy refill provided.  Requested Prescriptions  Pending Prescriptions Disp Refills  . FLUoxetine (PROZAC) 40 MG capsule [Pharmacy Med Name: FLUOXETINE HCL 40 MG CAP] 30 capsule 0    Sig: TAKE 1 CAPSULE BY MOUTH ONCE DAILY     Psychiatry:  Antidepressants - SSRI Failed - 11/16/2021  8:26 AM      Failed - Valid encounter within last 6 months    Recent Outpatient Visits          6 months ago Diabetic ulcer of left fifth toe [facility])   [facility], Coralie Keens, NP   7 months ago Encounter for general adult medical examination with abnormal findings   [facility] [place], Coralie Keens, NP   11 months ago Type 2 diabetes mellitus without complication, without long-term current use of insulin [facility])   [facility] [place], Coralie Keens, NP   1 year ago Type 2 diabetes mellitus without complication, without long-term current use of insulin [facility] [place])   [facility] [place], Coralie Keens, NP             Passed - Completed PHQ-2 or PHQ-9 in the last 360 days

## 2021-11-25 ENCOUNTER — Other Ambulatory Visit: Payer: Self-pay | Admitting: Internal Medicine

## 2021-11-25 NOTE — Telephone Encounter (Signed)
Not on current med list. Requested Prescriptions  Pending Prescriptions Disp Refills  . JANUVIA 100 MG tablet [Pharmacy Med Name: JANUVIA 100 MG TAB] 30 tablet     Sig: TAKE 1 TABLET BY MOUTH ONCE A DAY     Endocrinology:  Diabetes - DPP-4 Inhibitors Failed - 11/25/2021  5:22 PM      Failed - HBA1C is between 0 and 7.9 and within 180 days    Hgb A1c MFr Bld  Date Value Ref Range Status  03/23/2021 8.3 (H) <5.7 % of total Hgb Final    Comment:    For someone without known diabetes, a hemoglobin A1c value of 6.5% or greater indicates that they may have  diabetes and this should be confirmed with a follow-up  test. . For someone with known diabetes, a value <7% indicates  that their diabetes is well controlled and a value  greater than or equal to 7% indicates suboptimal  control. A1c targets should be individualized based on  duration of diabetes, age, comorbid conditions, and  other considerations. . Currently, no consensus exists regarding use of hemoglobin A1c for diagnosis of diabetes for children. .          Failed - Valid encounter within last 6 months    Recent Outpatient Visits          6 months ago Diabetic ulcer of left fifth toe [facility])   [facility], Coralie Keens, NP   8 months ago Encounter for general adult medical examination with abnormal findings   [facility] [place], Coralie Keens, NP   11 months ago Type 2 diabetes mellitus without complication, without long-term current use of insulin [facility])   [facility] [place], Coralie Keens, NP   1 year ago Type 2 diabetes mellitus without complication, without long-term current use of insulin [facility])   [facility], Coralie Keens, NP             Passed - Cr in normal range and within 360 days    Creat  Date Value Ref Range Status  03/23/2021 1.22 0.70 - 1.30 mg/dL Final   Creatinine, Urine  Date Value Ref Range Status  03/23/2021 58 20 - 320 mg/dL Final

## 2021-11-26 NOTE — Telephone Encounter (Signed)
Spouse states pt is forgetting to take OZEMPIC, 0.25 OR 0.5 MG/DOSE, 2 MG/3ML SOPN and inquiring if pt can be put back on JANUVIA 100 MG   Please fu w/ spouse

## 2021-11-26 NOTE — Telephone Encounter (Signed)
Lets make this decision after I check his A1C at his upcoming appt on 12/09/21. Thank her for letting me know he was forgetting to take it.

## 2021-12-04 ENCOUNTER — Other Ambulatory Visit: Payer: Self-pay | Admitting: Internal Medicine

## 2021-12-04 NOTE — Telephone Encounter (Signed)
Courtesy refill for glipizide. Future visit in 5 days . Requested Prescriptions  Pending Prescriptions Disp Refills  . tamsulosin (FLOMAX) 0.4 MG CAPS capsule [Pharmacy Med Name: TAMSULOSIN HCL 0.4 MG CAP] 90 capsule 1    Sig: TAKE 1 CAPSULE BY MOUTH ONCE DAILY     Urology: Alpha-Adrenergic Blocker Passed - 12/04/2021  9:27 AM      Passed - PSA in normal range and within 360 days    PSA  Date Value Ref Range Status  03/23/2021 0.58 < OR = 4.00 ng/mL Final    Comment:    The total PSA value from this assay system is  standardized against the WHO standard. The test  result will be approximately 20% lower when compared  to the equimolar-standardized total PSA (Beckman  Coulter). Comparison of serial PSA results should be  interpreted with this fact in mind. . This test was performed using the Siemens  chemiluminescent method. Values obtained from  different assay methods cannot be used interchangeably. PSA levels, regardless of value, should not be interpreted as absolute evidence of the presence or absence of disease.          Passed - Last BP in normal range    BP Readings from Last 1 Encounters:  09/08/21 138/74         Passed - Valid encounter within last 12 months    Recent Outpatient Visits          6 months ago Diabetic ulcer of left fifth toe [facility])   [facility], Coralie Keens, NP   8 months ago Encounter for general adult medical examination with abnormal findings   [facility] [place], Coralie Keens, NP   11 months ago Type 2 diabetes mellitus without complication, without long-term current use of insulin [facility])   [facility] [place], Coralie Keens, NP   1 year ago Type 2 diabetes mellitus without complication, without long-term current use of insulin ([place])   [facility], Coralie Keens, NP      Future Appointments            In 5 days [place], Coralie Keens, NP [facility] [place]           .  glipiZIDE (GLUCOTROL) 10 MG tablet [Pharmacy Med Name: GLIPIZIDE 10 MG TAB] 180 tablet 0    Sig: TAKE 1 TABLET BY MOUTH TWICE A DAY BEFORE A MEAL.     Endocrinology:  Diabetes - Sulfonylureas Failed - 12/04/2021  9:27 AM      Failed - HBA1C is between 0 and 7.9 and within 180 days    Hgb A1c MFr Bld  Date Value Ref Range Status  03/23/2021 8.3 (H) <5.7 % of total Hgb Final    Comment:    For someone without known diabetes, a hemoglobin A1c value of 6.5% or greater indicates that they may have  diabetes and this should be confirmed with a follow-up  test. . For someone with known diabetes, a value <7% indicates  that their diabetes is well controlled and a value  greater than or equal to 7% indicates suboptimal  control. A1c targets should be individualized based on  duration of diabetes, age, comorbid conditions, and  other considerations. . Currently, no consensus exists regarding use of hemoglobin A1c for diagnosis of diabetes for children. .          Failed - Valid encounter within last 6 months  Recent Outpatient Visits          6 months ago Diabetic ulcer of left fifth toe [facility])   [facility] [place], Salvadore [place], NP   8 months ago Encounter for general adult medical examination with abnormal findings   [facility], Salvadore Oxford, NP   11 months ago Type 2 diabetes mellitus without complication, without long-term current use of insulin [facility])   [facility] [place], Salvadore Oxford, NP   1 year ago Type 2 diabetes mellitus without complication, without long-term current use of insulin [facility])   [facility], Salvadore Oxford, NP      Future Appointments            In 5 days Lorre Munroe, NP [facility]           Passed - Cr in normal range and within 360 days    Creat  Date Value Ref Range Status  03/23/2021 1.22 0.70 - 1.30 mg/dL Final   Creatinine, Urine  Date Value Ref Range Status   03/23/2021 58 20 - 320 mg/dL Final

## 2021-12-09 ENCOUNTER — Ambulatory Visit (INDEPENDENT_AMBULATORY_CARE_PROVIDER_SITE_OTHER): Payer: 59 | Admitting: Internal Medicine

## 2021-12-09 ENCOUNTER — Encounter: Payer: Self-pay | Admitting: Internal Medicine

## 2021-12-09 ENCOUNTER — Other Ambulatory Visit: Payer: Self-pay | Admitting: Internal Medicine

## 2021-12-09 VITALS — BP 130/60 | HR 81 | Temp 96.9°F | Wt 345.0 lb

## 2021-12-09 DIAGNOSIS — F324 Major depressive disorder, single episode, in partial remission: Secondary | ICD-10-CM

## 2021-12-09 DIAGNOSIS — E119 Type 2 diabetes mellitus without complications: Secondary | ICD-10-CM | POA: Diagnosis not present

## 2021-12-09 DIAGNOSIS — N401 Enlarged prostate with lower urinary tract symptoms: Secondary | ICD-10-CM

## 2021-12-09 DIAGNOSIS — R3911 Hesitancy of micturition: Secondary | ICD-10-CM

## 2021-12-09 DIAGNOSIS — E11621 Type 2 diabetes mellitus with foot ulcer: Secondary | ICD-10-CM

## 2021-12-09 DIAGNOSIS — I10 Essential (primary) hypertension: Secondary | ICD-10-CM | POA: Diagnosis not present

## 2021-12-09 DIAGNOSIS — M159 Polyosteoarthritis, unspecified: Secondary | ICD-10-CM

## 2021-12-09 DIAGNOSIS — Z23 Encounter for immunization: Secondary | ICD-10-CM

## 2021-12-09 DIAGNOSIS — E7800 Pure hypercholesterolemia, unspecified: Secondary | ICD-10-CM | POA: Diagnosis not present

## 2021-12-09 DIAGNOSIS — E291 Testicular hypofunction: Secondary | ICD-10-CM

## 2021-12-09 DIAGNOSIS — L97509 Non-pressure chronic ulcer of other part of unspecified foot with unspecified severity: Secondary | ICD-10-CM | POA: Diagnosis not present

## 2021-12-09 DIAGNOSIS — K219 Gastro-esophageal reflux disease without esophagitis: Secondary | ICD-10-CM

## 2021-12-09 DIAGNOSIS — G4733 Obstructive sleep apnea (adult) (pediatric): Secondary | ICD-10-CM

## 2021-12-09 LAB — POCT GLYCOSYLATED HEMOGLOBIN (HGB A1C): HbA1c POC (<> result, manual entry): 10.3 % (ref 4.0–5.6)

## 2021-12-09 MED ORDER — GLIPIZIDE 10 MG PO TABS: 10.0000 mg | ORAL_TABLET | Freq: Two times a day (BID) | ORAL | 0 refills | Status: DC

## 2021-12-09 MED ORDER — OZEMPIC (0.25 OR 0.5 MG/DOSE) 2 MG/3ML ~~LOC~~ SOPN: 0.2500 mg | PEN_INJECTOR | SUBCUTANEOUS | 0 refills | Status: DC

## 2021-12-09 NOTE — Patient Instructions (Signed)

## 2021-12-09 NOTE — Assessment & Plan Note (Signed)
POCT A1c 10.3% Urine microalbumin has been checked within the last year We will restart glipizide and Ozempic, Rx sent to pharmacy Encourage low-carb diet and exercise for weight loss Continue gabapentin We will request copy of eye exam Encourage routine foot exam Flu shot today Pneumovax UTD Encourage him to get his COVID booster

## 2021-12-09 NOTE — Assessment & Plan Note (Signed)
C-Met and lipid profile today Continue rosuvastatin, ezetimibe and fish oil Encouraged him to consume a low-fat diet

## 2021-12-09 NOTE — Assessment & Plan Note (Signed)
Not medicated, not following with urology

## 2021-12-09 NOTE — Assessment & Plan Note (Signed)
Encourage weight loss as this can help reduce sleep apnea symptoms Noncompliant with CPAP 

## 2021-12-09 NOTE — Progress Notes (Signed)
Subjective:    Patient ID: Anthony Castillo, male    DOB: 06-Nov-1965, 56 y.o.   MRN: [id number]  HPI  Patient presents to clinic today for follow-up of chronic conditions.  Depression: Chronic dysthymia, managed on Lamotrigine, Fluoxetine and Bupropion.  He is not currently seeing a therapist.  He denies anxiety, SI/HI.  DM2 with Neuropathy: His last A1c was 8.3%, 03/2021.  He is not taking Glipizide or Ozempic but is taking Gabapentin as prescribed.  He does not check his sugars. He does not check his feet routinely.  His last eye exam was <1-year ago, [facility].  Flu 11/2018.  Pneumovax 03/2021.  COVID Moderna x2.  HTN: His BP today is 130/60.  He is taking Lisinopril HCT and Amlodipine as prescribed.  ECG from 08/2021 reviewed.  BPH: Mainly urinary urgency, frequency and nocturia.  He is taking Flomax as prescribed.  He does not see urology.  GERD: He is not sure what triggers this. He denies breakthrough on Pantoprazole.  There is no upper GI on file.  Hypogonadism: He is not currently taking Testosterone.  He does not follow with urology.  OA: Generalized.  He is taking Celebrex, Zanaflex and Gabapentin with some relief of symptoms.  He takes Tylenol Arthritis as needed for breakthrough.  He does not follow with orthopedics.  HLD: His last LDL was 51, triglycerides 409, 03/2021.  He denies myalgias on Atorvastatin, Ezetimibe and Fish Oil.  He does not consume a low-fat diet.  OSA: He averages < 5 hours of sleep without the use of his CPAP.  Sleep study from 2005 reviewed.  Review of Systems  Past Medical History:  Diagnosis Date   Chronic pain 04/09/2011   DEPRESSION 03/16/2007   GERD (gastroesophageal reflux disease)    HYPERTENSION 09/22/2006   Hypogonadism male 04/09/2011   Morbid obesity (HCC) 09/22/2006   Osteoarthritis 04/09/2011   Sleep apnea    uses a cpap-will use after surgery   Wears glasses     Current Outpatient Medications  Medication Sig Dispense Refill    Acetaminophen (TYLENOL ARTHRITIS PAIN PO) Take by mouth as needed.     amLODipine (NORVASC) 10 MG tablet Take 1 tablet (10 mg total) by mouth daily. 90 tablet 2   augmented betamethasone dipropionate (DIPROLENE-AF) 0.05 % ointment Apply topically.     buPROPion (WELLBUTRIN SR) 150 MG 12 hr tablet 150 mg daily.  0   celecoxib (CELEBREX) 100 MG capsule TAKE 1 CAPSULE BY MOUTH TWICE DAILY 180 capsule 0   cephALEXin (KEFLEX) 500 MG capsule Take 1 capsule (500 mg total) by mouth 3 (three) times daily. 30 capsule 0   cetirizine (ZYRTEC) 10 MG tablet Take 10 mg by mouth daily.     Cholecalciferol (VITAMIN D3) 1000 units CAPS Take 1 capsule by mouth daily.     ezetimibe (ZETIA) 10 MG tablet Take 1 tablet (10 mg total) by mouth daily. 90 tablet 2   fluocinonide cream (LIDEX) 0.05 % as needed.   1   FLUoxetine (PROZAC) 40 MG capsule TAKE 1 CAPSULE BY MOUTH ONCE DAILY 30 capsule 0   glipiZIDE (GLUCOTROL) 10 MG tablet TAKE 1 TABLET BY MOUTH TWICE A DAY BEFORE A MEAL. 180 tablet 0   indomethacin (INDOCIN) 50 MG capsule Take 1 capsule (50 mg total) by mouth 2 (two) times daily with a meal. 60 capsule 1   Insulin Pen Needle 31G X 5 MM MISC BD Pen Needles- brand specific Inject insulin via insulin pen 6 x  daily 30 each 0   lamoTRIgine (LAMICTAL) 100 MG tablet TAKE 1 TABLET BY MOUTH EVERY MORNING 90 tablet 0   lisinopril-hydrochlorothiazide (ZESTORETIC) 20-12.5 MG tablet Take 2 tablets by mouth daily. 180 tablet 2   OZEMPIC, 0.25 OR 0.5 MG/DOSE, 2 MG/3ML SOPN INJECT 0.25 MG INTO THE SKIN ONCE A WEEKFOR FIRST 4 WEEKS. THEN INCREASE DOSE TO0.5MG  WEEKLY 1.5 mL 0   pantoprazole (PROTONIX) 40 MG tablet TAKE 1 TABLET BY MOUTH ONCE A DAY 90 tablet 2   potassium chloride (KLOR-CON) 10 MEQ tablet TAKE 1 TABLET BY MOUTH ONCE A DAY (NEED OFFICE VISIT FOR REFILLS) 90 tablet 2   rosuvastatin (CRESTOR) 10 MG tablet Take 1 tablet (10 mg total) by mouth daily. 90 tablet 2   tamsulosin (FLOMAX) 0.4 MG CAPS capsule TAKE 1 CAPSULE  BY MOUTH ONCE DAILY 90 capsule 1   tizanidine (ZANAFLEX) 6 MG capsule Take 1 capsule (6 mg total) by mouth 3 (three) times daily as needed for muscle spasms. 20 capsule 0   No current facility-administered medications for this visit.    Allergies  Allergen Reactions   Penicillins     REACTION: unspecified    Family History  Problem Relation Age of Onset   Cancer Mother        lymphoma   Heart disease Father    Hypertension Father    Diabetes Sister    Stroke Paternal Uncle    Diabetes Maternal Grandmother    Arthritis Paternal Grandmother    Heart disease Paternal Grandfather    Migraines Daughter    Irritable bowel syndrome Daughter    Heart Problems Son        valve     Social History   Socioeconomic History   Marital status: Married    Spouse name: Not on file   Number of children: Not on file   Years of education: Not on file   Highest education level: Not on file  Occupational History   Not on file  Tobacco Use   Smoking status: Never   Smokeless tobacco: Current    Types: Chew   Tobacco comments:    has been using chew 40 years  Vaping Use   Vaping Use: Never used  Substance and Sexual Activity   Alcohol use: Yes    Comment: rare   Drug use: Never   Sexual activity: Yes  Other Topics Concern   Not on file  Social History Narrative   Not on file   Social Determinants of Health   Financial Resource Strain: Not on file  Food Insecurity: Not on file  Transportation Needs: Not on file  Physical Activity: Not on file  Stress: Not on file  Social Connections: Not on file  Intimate Partner Violence: Not on file     Constitutional: Patient reports fatigue.  Denies fever, malaise, headache or abrupt weight changes.  HEENT: Denies eye pain, eye redness, ear pain, ringing in the ears, wax buildup, runny nose, nasal congestion, bloody nose, or sore throat. Respiratory: Denies difficulty breathing, shortness of breath, cough or sputum production.    Cardiovascular: Denies chest pain, chest tightness, palpitations or swelling in the hands or feet.  Gastrointestinal: Denies abdominal pain, bloating, constipation, diarrhea or blood in the stool.  GU: Patient reports urinary frequency.  Denies urgency, pain with urination, burning sensation, blood in urine, odor or discharge. Musculoskeletal: Patient reports joint pain.  Denies decrease in range of motion, difficulty with gait, muscle pain or joint swelling.  Skin:  Denies redness, rashes, lesions or ulcercations.  Neurological: Denies dizziness, difficulty with memory, difficulty with speech or problems with balance and coordination.  Psych: Patient has a history of depression.  Denies anxiety, SI/HI.  No other specific complaints in a complete review of systems (except as listed in HPI above).     Objective:   Physical Exam BP 130/60 (BP Location: Right Arm, Patient Position: Sitting, Cuff Size: Large)   Pulse 81   Temp (!) 96.9 F (36.1 C) (Temporal)   Wt (!) 345 lb (156.5 kg)   SpO2 95%   BMI 46.79 kg/m   Wt Readings from Last 3 Encounters:  09/08/21 (!) 350 lb (158.8 kg)  05/14/21 (!) 358 lb (162.4 kg)  03/23/21 (!) 357 lb 8 oz (162.2 kg)    General: Appears his stated age, obese, in NAD. Skin: Warm, dry and intact.  Psoriasis noted of elbows.  No ulcerations noted. HEENT: Head: normal shape and size; Eyes: sclera white, no icterus, conjunctiva pink, PERRLA and EOMs intact;  Cardiovascular: Normal rate and rhythm. S1,S2 noted.  No murmur, rubs or gallops noted.  Trace BLE edema. No carotid bruits noted. Pulmonary/Chest: Normal effort and positive vesicular breath sounds. No respiratory distress. No wheezes, rales or ronchi noted.  Abdomen: Soft and nontender. Normal bowel sounds.  Musculoskeletal: No difficulty with gait.  Neurological: Alert and oriented.  Psychiatric: Mood and affect mildly flat. Behavior is normal. Judgment and thought content normal.    BMET     Component Value Date/Time   NA 132 (L) 03/23/2021 1014   K 3.8 03/23/2021 1014   CL 96 (L) 03/23/2021 1014   CO2 30 03/23/2021 1014   GLUCOSE 197 (H) 03/23/2021 1014   BUN 12 03/23/2021 1014   CREATININE 1.22 03/23/2021 1014   CALCIUM 9.5 03/23/2021 1014   GFRNONAA 69 08/05/2020 0955   GFRAA 79 08/05/2020 0955    Lipid Panel     Component Value Date/Time   CHOL 111 03/23/2021 1014   TRIG 261 (H) 03/23/2021 1014   HDL 29 (L) 03/23/2021 1014   CHOLHDL 3.8 03/23/2021 1014   VLDL 28.0 01/23/2020 1413   LDLCALC 51 03/23/2021 1014    CBC    Component Value Date/Time   WBC 5.5 04/15/2021 1024   WBC 5.6 03/23/2021 1014   RBC 5.07 04/15/2021 1024   RBC 5.20 03/23/2021 1014   HGB 13.7 04/15/2021 1024   HCT 40.3 04/15/2021 1024   PLT 229 04/15/2021 1024   MCV 80 04/15/2021 1024   MCH 27.0 04/15/2021 1024   MCH 26.5 (L) 03/23/2021 1014   MCHC 34.0 04/15/2021 1024   MCHC 32.2 03/23/2021 1014   RDW 13.1 04/15/2021 1024   LYMPHSABS 1.3 04/15/2021 1024   EOSABS 0.1 04/15/2021 1024   BASOSABS 0.1 04/15/2021 1024    Hgb A1C Lab Results  Component Value Date   HGBA1C 8.3 (H) 03/23/2021             Assessment & Plan:    RTC in 3 months for your annual exam Nicki Reaper, NP

## 2021-12-09 NOTE — Assessment & Plan Note (Signed)
Encourage diet and exercise for weight loss 

## 2021-12-09 NOTE — Assessment & Plan Note (Signed)
Controlled on lisinopril HCT and amlodipine Reinforced DASH diet and exercise for weight loss C-Met today

## 2021-12-09 NOTE — Assessment & Plan Note (Signed)
Try to identify and avoid foods that trigger reflux Encourage weight loss as this can help reduce reflux symptoms Continue pantoprazole 

## 2021-12-09 NOTE — Assessment & Plan Note (Signed)
Continue lamotrigine, fluoxetine and bupropion Support offered

## 2021-12-09 NOTE — Assessment & Plan Note (Signed)
-   Continue Flomax 

## 2021-12-09 NOTE — Assessment & Plan Note (Signed)
Continue Celebrex, gabapentin and Zanaflex Encourage weight loss as this can help reduce joint pain

## 2021-12-10 LAB — LIPID PANEL
Cholesterol: 91 mg/dL (ref <200)
HDL: 29 mg/dL — ABNORMAL LOW (ref >=40)
LDL Cholesterol (Calc): 35 mg/dL (calc)
Non-HDL Cholesterol (Calc): 62 mg/dL (calc) (ref <130)
Total CHOL/HDL Ratio: 3.1 (calc) (ref <5.0)
Triglycerides: 195 mg/dL — ABNORMAL HIGH (ref <150)

## 2021-12-10 LAB — COMPLETE METABOLIC PANEL WITH GFR
AG Ratio: 1.8 (calc) (ref 1.0–2.5)
ALT: 29 U/L (ref 9–46)
AST: 31 U/L (ref 10–35)
Albumin: 4.3 g/dL (ref 3.6–5.1)
Alkaline phosphatase (APISO): 81 U/L (ref 35–144)
BUN: 12 mg/dL (ref 7–25)
CO2: 26 mmol/L (ref 20–32)
Calcium: 9.2 mg/dL (ref 8.6–10.3)
Chloride: 98 mmol/L (ref 98–110)
Creat: 1.08 mg/dL (ref 0.70–1.30)
Globulin: 2.4 g/dL (calc) (ref 1.9–3.7)
Glucose, Bld: 252 mg/dL — ABNORMAL HIGH (ref 65–99)
Potassium: 3.9 mmol/L (ref 3.5–5.3)
Sodium: 134 mmol/L — ABNORMAL LOW (ref 135–146)
Total Bilirubin: 0.8 mg/dL (ref 0.2–1.2)
Total Protein: 6.7 g/dL (ref 6.1–8.1)
eGFR: 80 mL/min/{1.73_m2} (ref >=60)

## 2021-12-10 NOTE — Telephone Encounter (Signed)
Requested medication (s) are due for refill today: unsure  Requested medication (s) are on the active medication list:no  Last refill:  unknown  Future visit scheduled:no  Notes to clinic:  Unable to refill per protocol, Rx expired.  Medication is not on current med list. Routing for review.     Requested Prescriptions  Pending Prescriptions Disp Refills   JANUVIA 100 MG tablet [Pharmacy Med Name: JANUVIA 100 MG TAB] 30 tablet     Sig: TAKE 1 TABLET BY MOUTH ONCE A DAY     Endocrinology:  Diabetes - DPP-4 Inhibitors Failed - 12/09/2021 12:29 PM      Failed - HBA1C is between 0 and 7.9 and within 180 days    HbA1c POC (<> result, manual entry)  Date Value Ref Range Status  12/09/2021 10.3 4.0 - 5.6 % Final         Passed - Cr in normal range and within 360 days    Creat  Date Value Ref Range Status  12/09/2021 1.08 0.70 - 1.30 mg/dL Final   Creatinine, Urine  Date Value Ref Range Status  03/23/2021 58 20 - 320 mg/dL Final         Passed - Valid encounter within last 6 months    Recent Outpatient Visits           Yesterday Need for influenza vaccination   [facility] [place], Coralie Keens, NP   7 months ago Diabetic ulcer of left fifth toe [facility])   [facility], Coralie Keens, NP   8 months ago Encounter for general adult medical examination with abnormal findings   [facility] [place], Coralie Keens, NP   11 months ago Type 2 diabetes mellitus without complication, without long-term current use of insulin [facility])   [facility] [place], [place] W, NP   1 year ago Type 2 diabetes mellitus without complication, without long-term current use of insulin [facility])   [facility] [place], Coralie Keens, NP

## 2021-12-11 NOTE — Telephone Encounter (Signed)
Tried calling; Mrs. Hyle's VM is not set up.  PEC Please advise when she calls back.    Thanks,    -Mickel Baas

## 2021-12-11 NOTE — Telephone Encounter (Signed)
Copied from [place] [id number]. Topic: General - Inquiry >> Dec 10, 2021  3:00 PM Leilani Able wrote: Reason for CRM: Please have Regina's nurse follow up with Juliane Poot. PT had an appt today, and seems confused if he is to be taking all new medication or stop some of the old, there was a problem with his meter. etc She is very sorry but she helps him keep track of meds, not necessarily him and he seems confused so she was just wanting to fu and get the correct info so he will not take to much medication as she feels he was probably to stop one of the meds. FU before he changes to new meds at [place]

## 2021-12-11 NOTE — Telephone Encounter (Signed)
Tried calling; Mrs. Lauro's VM is not set up.  PEC Please advise when she calls back.    Thanks,    -Tyjah Hai  

## 2021-12-11 NOTE — Telephone Encounter (Signed)
Can you call patients wife and ask if she knows if he has been taking this or not?

## 2021-12-14 ENCOUNTER — Other Ambulatory Visit: Payer: Self-pay | Admitting: Internal Medicine

## 2021-12-15 NOTE — Telephone Encounter (Signed)
Requested Prescriptions  Pending Prescriptions Disp Refills  . FLUoxetine (PROZAC) 40 MG capsule [Pharmacy Med Name: FLUOXETINE HCL 40 MG CAP] 90 capsule 1    Sig: TAKE 1 CAPSULE BY MOUTH ONCE DAILY     Psychiatry:  Antidepressants - SSRI Passed - 12/14/2021  8:32 AM      Passed - Completed PHQ-2 or PHQ-9 in the last 360 days      Passed - Valid encounter within last 6 months    Recent Outpatient Visits          6 days ago Need for influenza vaccination   [facility], Coralie Keens, NP   7 months ago Diabetic ulcer of left fifth toe [facility])   [facility], Coralie Keens, NP   8 months ago Encounter for general adult medical examination with abnormal findings   [facility] [place], Coralie Keens, NP   12 months ago Type 2 diabetes mellitus without complication, without long-term current use of insulin [facility])   [facility] [place], Coralie Keens, NP   1 year ago Type 2 diabetes mellitus without complication, without long-term current use of insulin [facility])   [facility] [place], Coralie Keens, NP

## 2021-12-23 NOTE — Telephone Encounter (Signed)
See previous telephone encounter.   Thanks,   -Andru Genter  

## 2021-12-23 NOTE — Addendum Note (Signed)
Addended by: Ashley Royalty E on: 12/23/2021 04:38 PM   Modules accepted: Orders

## 2021-12-23 NOTE — Telephone Encounter (Signed)
Pt wife is again calling stating that ins is not going to cover the ozempic and still wants to go back to the Januvia. Has not tested BS in a while but last time was over 300 states needs more than just glipiZIDE (GLUCOTROL) 10 MG tablet FU at [phone]

## 2021-12-23 NOTE — Telephone Encounter (Signed)
Mrs. Hunnell states that [employer] will not cover Ozempic but will cover Januvia.  Is it okay to restart Januvia?   Thanks,   -Mickel Baas

## 2021-12-24 MED ORDER — SITAGLIPTIN PHOSPHATE 100 MG PO TABS: 100.0000 mg | ORAL_TABLET | Freq: Every day | ORAL | 1 refills | Status: DC

## 2021-12-25 DIAGNOSIS — M216X1 Other acquired deformities of right foot: Secondary | ICD-10-CM | POA: Insufficient documentation

## 2021-12-25 DIAGNOSIS — M7671 Peroneal tendinitis, right leg: Secondary | ICD-10-CM | POA: Diagnosis not present

## 2021-12-25 DIAGNOSIS — M79671 Pain in right foot: Secondary | ICD-10-CM | POA: Diagnosis not present

## 2021-12-25 DIAGNOSIS — M7670 Peroneal tendinitis, unspecified leg: Secondary | ICD-10-CM | POA: Insufficient documentation

## 2022-01-04 ENCOUNTER — Other Ambulatory Visit: Payer: Self-pay | Admitting: Internal Medicine

## 2022-01-05 NOTE — Telephone Encounter (Signed)
Requested Prescriptions  Pending Prescriptions Disp Refills   lamoTRIgine (LAMICTAL) 100 MG tablet [Pharmacy Med Name: LAMOTRIGINE 100 MG TAB] 90 tablet 0    Sig: TAKE 1 TABLET BY MOUTH EVERY MORNING     Neurology:  Anticonvulsants - lamotrigine Passed - 01/04/2022  4:51 PM      Passed - Cr in normal range and within 360 days    Creat  Date Value Ref Range Status  12/09/2021 1.08 0.70 - 1.30 mg/dL Final   Creatinine, Urine  Date Value Ref Range Status  03/23/2021 58 20 - 320 mg/dL Final         Passed - ALT in normal range and within 360 days    ALT  Date Value Ref Range Status  12/09/2021 29 9 - 46 U/L Final         Passed - AST in normal range and within 360 days    AST  Date Value Ref Range Status  12/09/2021 31 10 - 35 U/L Final         Passed - Completed PHQ-2 or PHQ-9 in the last 360 days      Passed - Valid encounter within last 12 months    Recent Outpatient Visits           3 weeks ago Need for influenza vaccination   [facility] [place], Coralie Keens, NP   7 months ago Diabetic ulcer of left fifth toe [facility])   [facility], Coralie Keens, NP   9 months ago Encounter for general adult medical examination with abnormal findings   [facility] [place], Coralie Keens, NP   1 year ago Type 2 diabetes mellitus without complication, without long-term current use of insulin [facility])   [facility] [place], [place] W, NP   1 year ago Type 2 diabetes mellitus without complication, without long-term current use of insulin [facility])   [facility] [place], Coralie Keens, NP

## 2022-02-01 ENCOUNTER — Other Ambulatory Visit: Payer: Self-pay | Admitting: Internal Medicine

## 2022-02-02 NOTE — Telephone Encounter (Signed)
Requested Prescriptions  Pending Prescriptions Disp Refills   lamoTRIgine (LAMICTAL) 100 MG tablet [Pharmacy Med Name: LAMOTRIGINE 100 MG TAB] 90 tablet 0    Sig: TAKE 1 TABLET BY MOUTH EVERY MORNING     Neurology:  Anticonvulsants - lamotrigine Passed - 02/01/2022  1:38 PM      Passed - Cr in normal range and within 360 days    Creat  Date Value Ref Range Status  12/09/2021 1.08 0.70 - 1.30 mg/dL Final   Creatinine, Urine  Date Value Ref Range Status  03/23/2021 58 20 - 320 mg/dL Final         Passed - ALT in normal range and within 360 days    ALT  Date Value Ref Range Status  12/09/2021 29 9 - 46 U/L Final         Passed - AST in normal range and within 360 days    AST  Date Value Ref Range Status  12/09/2021 31 10 - 35 U/L Final         Passed - Completed PHQ-2 or PHQ-9 in the last 360 days      Passed - Valid encounter within last 12 months    Recent Outpatient Visits           1 month ago Need for influenza vaccination   [facility] [place], Salvadore Oxford, NP   8 months ago Diabetic ulcer of left fifth toe [facility])   [facility], Salvadore Oxford, NP   10 months ago Encounter for general adult medical examination with abnormal findings   [facility] [place], Salvadore Oxford, NP   1 year ago Type 2 diabetes mellitus without complication, without long-term current use of insulin [facility])   [facility] [place], [place] W, NP   1 year ago Type 2 diabetes mellitus without complication, without long-term current use of insulin (HCC)   [facility] [place], Salvadore Oxford, NP       Future Appointments             Tomorrow [facility], Salvadore Oxford, NP [facility], [facility]

## 2022-02-03 ENCOUNTER — Ambulatory Visit: Payer: 59 | Admitting: Internal Medicine

## 2022-02-04 ENCOUNTER — Ambulatory Visit: Payer: 59 | Admitting: Internal Medicine

## 2022-02-05 ENCOUNTER — Ambulatory Visit (INDEPENDENT_AMBULATORY_CARE_PROVIDER_SITE_OTHER): Payer: 59 | Admitting: Internal Medicine

## 2022-02-05 ENCOUNTER — Encounter: Payer: Self-pay | Admitting: Internal Medicine

## 2022-02-05 ENCOUNTER — Ambulatory Visit: Payer: 59 | Admitting: Internal Medicine

## 2022-02-05 VITALS — BP 136/72 | HR 74 | Temp 96.8°F | Wt 345.0 lb

## 2022-02-05 DIAGNOSIS — R1031 Right lower quadrant pain: Secondary | ICD-10-CM | POA: Diagnosis not present

## 2022-02-05 DIAGNOSIS — T59811A Toxic effect of smoke, accidental (unintentional), initial encounter: Secondary | ICD-10-CM

## 2022-02-05 MED ORDER — PREDNISONE 10 MG PO TABS: ORAL_TABLET | ORAL | 0 refills | Status: DC
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
## 2022-02-05 NOTE — Patient Instructions (Signed)
Abdominal Pain, Adult Many things can cause belly (abdominal) pain. Most times, belly pain is not dangerous. Many cases of belly pain can be watched and treated at home. Sometimes, though, belly pain is serious. Your doctor will try to find the cause of your belly pain. Follow these instructions at home:  Medicines Take over-the-counter and prescription medicines only as told by your doctor. Do not take medicines that help you poop (laxatives) unless told by your doctor. General instructions Watch your belly pain for any changes. Drink enough fluid to keep your pee (urine) pale yellow. Keep all follow-up visits as told by your doctor. This is important. Contact a doctor if: Your belly pain changes or gets worse. You are not hungry, or you lose weight without trying. You are having trouble pooping (constipated) or have watery poop (diarrhea) for more than 2-3 days. You have pain when you pee or poop. Your belly pain wakes you up at night. Your pain gets worse with meals, after eating, or with certain foods. You are vomiting and cannot keep anything down. You have a fever. You have blood in your pee. Get help right away if: Your pain does not go away as soon as your doctor says it should. You cannot stop vomiting. Your pain is only in areas of your belly, such as the right side or the left lower part of the belly. You have bloody or black poop, or poop that looks like tar. You have very bad pain, cramping, or bloating in your belly. You have signs of not having enough fluid or water in your body (dehydration), such as: Dark pee, very little pee, or no pee. Cracked lips. Dry mouth. Sunken eyes. Sleepiness. Weakness. You have trouble breathing or chest pain. Summary Many cases of belly pain can be watched and treated at home. Watch your belly pain for any changes. Take over-the-counter and prescription medicines only as told by your doctor. Contact a doctor if your belly pain  changes or gets worse. Get help right away if you have very bad pain, cramping, or bloating in your belly. This information is not intended to replace advice given to you by your health care provider. Make sure you discuss any questions you have with your health care provider. Document Revised: 06/26/2018 Document Reviewed: 06/26/2018 Elsevier Patient Education  2023 Elsevier Inc.  

## 2022-02-11 ENCOUNTER — Other Ambulatory Visit: Payer: Self-pay | Admitting: Internal Medicine

## 2022-02-11 DIAGNOSIS — R1031 Right lower quadrant pain: Secondary | ICD-10-CM

## 2022-02-17 ENCOUNTER — Other Ambulatory Visit: Payer: Self-pay | Admitting: Internal Medicine

## 2022-02-17 DIAGNOSIS — M791 Myalgia, unspecified site: Secondary | ICD-10-CM

## 2022-02-17 DIAGNOSIS — M2550 Pain in unspecified joint: Secondary | ICD-10-CM

## 2022-02-17 NOTE — Telephone Encounter (Signed)
Requested Prescriptions  Pending Prescriptions Disp Refills   celecoxib (CELEBREX) 100 MG capsule [Pharmacy Med Name: CELECOXIB 100 MG CAP] 180 capsule 0    Sig: TAKE 1 CAPSULE BY MOUTH TWICE DAILY     Analgesics:  COX2 Inhibitors Failed - 02/17/2022  8:23 AM      Failed - Manual Review: Labs are only required if the patient has taken medication for more than 8 weeks.      Passed - HGB in normal range and within 360 days    Hemoglobin  Date Value Ref Range Status  04/15/2021 13.7 13.0 - 17.7 g/dL Final         Passed - Cr in normal range and within 360 days    Creat  Date Value Ref Range Status  12/09/2021 1.08 0.70 - 1.30 mg/dL Final   Creatinine, Urine  Date Value Ref Range Status  03/23/2021 58 20 - 320 mg/dL Final         Passed - HCT in normal range and within 360 days    Hematocrit  Date Value Ref Range Status  04/15/2021 40.3 37.5 - 51.0 % Final         Passed - AST in normal range and within 360 days    AST  Date Value Ref Range Status  12/09/2021 31 10 - 35 U/L Final         Passed - ALT in normal range and within 360 days    ALT  Date Value Ref Range Status  12/09/2021 29 9 - 46 U/L Final         Passed - eGFR is 30 or above and within 360 days    GFR, Est African American  Date Value Ref Range Status  08/05/2020 79 > OR = 60 mL/min/1.39m Final   GFR, Est Non African American  Date Value Ref Range Status  08/05/2020 69 > OR = 60 mL/min/1.711mFinal   GFR  Date Value Ref Range Status  01/23/2020 67.67 >60.00 mL/min Final    Comment:    Calculated using the CKD-EPI Creatinine Equation (2021)   eGFR  Date Value Ref Range Status  12/09/2021 80 > OR = 60 mL/min/1.7361minal         Passed - Patient is not pregnant      Passed - Valid encounter within last 12 months    Recent Outpatient Visits           1 week ago Right lower quadrant abdominal pain   [facility]   2 months ago Need for influenza  vaccination   [facility]   9 months ago [facility]   11 months ago Encounter for general adult medical examination with abnormal findings   [facility]   1 year ago Type 2 diabetes mellitus without complication, without long-term current use of insulin ([facility]

## 2022-02-25 ENCOUNTER — Other Ambulatory Visit: Payer: Self-pay

## 2022-03-13 ENCOUNTER — Other Ambulatory Visit: Payer: Self-pay | Admitting: Internal Medicine

## 2022-03-15 NOTE — Telephone Encounter (Signed)
Requested Prescriptions  Pending Prescriptions Disp Refills   pantoprazole (PROTONIX) 40 MG tablet [Pharmacy Med Name: PANTOPRAZOLE SODIUM 40 MG DR TAB] 90 tablet 0    Sig: TAKE 1 TABLET BY MOUTH ONCE A DAY     Gastroenterology: Proton Pump Inhibitors Passed - 03/13/2022 10:44 AM      Passed - Valid encounter within last 12 months    Recent Outpatient Visits           1 month ago Right lower quadrant abdominal pain   [facility] [place], Coralie Keens, NP   3 months ago Need for influenza vaccination   [facility] [place], Coralie Keens, NP   10 months ago Diabetic ulcer of left fifth toe [facility])   [facility], Coralie Keens, NP   11 months ago Encounter for general adult medical examination with abnormal findings   [facility] [place], Coralie Keens, NP   1 year ago Type 2 diabetes mellitus without complication, without long-term current use of insulin [facility])   [facility] [place], Coralie Keens, NP

## 2022-04-14 ENCOUNTER — Ambulatory Visit: Payer: Self-pay

## 2022-04-14 NOTE — Telephone Encounter (Signed)
ummary: sore in corners of mouth/ diabetic patient   Pt and spouse called in for assistance. Spouse says that pt is a diabetic. Pt has dry spots in the corner of his mouth. Pt would like to know if provider could prescribe a medication that could help. Pt says that his pharmacist suggested that it is yeast. Pt would like further assistance.   Pt would like to have Rx sent to pharmacy on file.     Chief Complaint: Lips cracked in corners. Asking for medication for lips. Symptoms: Cracked lips Frequency: 1 week ago Pertinent Negatives: Patient denies  Disposition: []$ ED /[]$ Urgent Care (no appt availability in office) / []$ Appointment(In office/virtual)/ []$  [facility]/ []$ Home Care/ []$ Refused Recommended Disposition /[]$ [facility]/ [x]$  Follow-up with PCP Additional Notes: Please advise pt.   Answer Assessment - Initial Assessment Questions 1. SYMPTOM: "What's the main symptom you're concerned about?" (e.g., chapped lips, dry mouth, lump, sores)     Cracks in corners of mouth 2. ONSET: "When did the    start?"     Last week 3. PAIN: "Is there any pain?" If Yes, ask: "How bad is it?" (Scale: 1-10; mild, moderate, severe)   - MILD (1-3):  doesn't interfere with eating or normal activities   - MODERATE (4-7): interferes with eating some solids and normal activities   - SEVERE (8-10):  excruciating pain, interferes with most normal activities   - SEVERE DYSPHAGIA: can't swallow liquids, drooling     Mild 4. CAUSE: "What do you think is causing the symptoms?"     Unsure 5. OTHER SYMPTOMS: "Do you have any other symptoms?" (e.g., fever, sore throat, toothache, swelling)     None 6. PREGNANCY: "Is there any chance you are pregnant?" "When was your last menstrual period?"     N/a  Protocols used: Mouth Symptoms-A-AH

## 2022-04-14 NOTE — Telephone Encounter (Signed)
Advised pt he would need an appointment.  He agreed to come in 04/15/2021 at 9:20am   Thanks,   -Mickel Baas

## 2022-04-15 ENCOUNTER — Encounter: Payer: Self-pay | Admitting: Internal Medicine

## 2022-04-15 ENCOUNTER — Ambulatory Visit (INDEPENDENT_AMBULATORY_CARE_PROVIDER_SITE_OTHER): Payer: 59 | Admitting: Internal Medicine

## 2022-04-15 VITALS — BP 106/74 | HR 126 | Temp 96.8°F | Wt 346.0 lb

## 2022-04-15 DIAGNOSIS — G4733 Obstructive sleep apnea (adult) (pediatric): Secondary | ICD-10-CM | POA: Diagnosis not present

## 2022-04-15 DIAGNOSIS — I10 Essential (primary) hypertension: Secondary | ICD-10-CM

## 2022-04-15 DIAGNOSIS — E119 Type 2 diabetes mellitus without complications: Secondary | ICD-10-CM | POA: Diagnosis not present

## 2022-04-15 DIAGNOSIS — K130 Diseases of lips: Secondary | ICD-10-CM

## 2022-04-15 DIAGNOSIS — E7800 Pure hypercholesterolemia, unspecified: Secondary | ICD-10-CM | POA: Diagnosis not present

## 2022-04-15 DIAGNOSIS — Z6841 Body Mass Index (BMI) 40.0 and over, adult: Secondary | ICD-10-CM | POA: Diagnosis not present

## 2022-04-15 LAB — POCT GLYCOSYLATED HEMOGLOBIN (HGB A1C): Hemoglobin A1C: 9.7 % — AB (ref 4.0–5.6)

## 2022-04-15 MED ORDER — NYSTATIN-TRIAMCINOLONE 100000-0.1 UNIT/GM-% EX OINT: 1.0000 | TOPICAL_OINTMENT | Freq: Two times a day (BID) | CUTANEOUS | 0 refills | Status: DC

## 2022-04-15 NOTE — Telephone Encounter (Signed)
Will discuss at upcoming appointment

## 2022-04-15 NOTE — Assessment & Plan Note (Signed)
Encouraged him to consume a low-fat diet Continue atorvastatin, ezetimibe and fish oil

## 2022-04-15 NOTE — Assessment & Plan Note (Signed)
Controlled on lisinopril HCT and amlodipine Reinforced DASH diet and exercise weight loss

## 2022-04-15 NOTE — Progress Notes (Signed)
Subjective:    Patient ID: Anthony Castillo, male    DOB: October 24, 1965, 57 y.o.   MRN: [id number]  HPI  Patient presents to clinic today for follow-up of chronic conditions.    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  He is taking Glipizide and Januvia as prescribed.  He does not check his sugars.  He does not check his feet routinely.  His last eye exam was 03/2021, [place].  Flu 11/2021.  Pneumovax 03/2021.  Prevnar 11/2017.  COVID Moderna x 2.  HTN: His BP today is 106/74.  He is taking Lisinopril HCT and Amlodipine as prescribed.  ECG from 08/2021 reviewed.  HLD: His last LDL was 35, triglycerides 195, 11/2021.  He denies myalgias on Atorvastatin, Ezetimibe and Fish Oil.  He does not consume a low-fat diet.  OSA: He averages 5 hours of sleep without the use of his CPAP.  Sleep study from 2005 reviewed.  He also reports rash of the corners of his mouth.  He noticed this 1 week ago. He reports this burns. He has not noticed any white discoloration. He has tried chapstick with minimal relief of symptoms.  Review of Systems     Past Medical History:  Diagnosis Date   Chronic pain 04/09/2011   DEPRESSION 03/16/2007   GERD (gastroesophageal reflux disease)    HYPERTENSION 09/22/2006   Hypogonadism male 04/09/2011   Morbid obesity ([place]) 09/22/2006   Osteoarthritis 04/09/2011   Sleep apnea    uses a cpap-will use after surgery   Wears glasses     Current Outpatient Medications  Medication Sig Dispense Refill   Acetaminophen (TYLENOL ARTHRITIS PAIN PO) Take by mouth as needed.     amLODipine (NORVASC) 10 MG tablet Take 1 tablet (10 mg total) by mouth daily. 90 tablet 2   augmented betamethasone dipropionate (DIPROLENE-AF) 0.05 % ointment Apply topically.     buPROPion (WELLBUTRIN SR) 150 MG 12 hr tablet 150 mg daily.  0   celecoxib (CELEBREX) 100 MG capsule TAKE 1 CAPSULE BY MOUTH TWICE DAILY 180 capsule 0   cetirizine (ZYRTEC) 10 MG tablet Take 10 mg by mouth daily.      Cholecalciferol (VITAMIN D3) 1000 units CAPS Take 1 capsule by mouth daily.     ezetimibe (ZETIA) 10 MG tablet Take 1 tablet (10 mg total) by mouth daily. 90 tablet 2   fluocinonide cream (LIDEX) 0.05 % as needed.   1   FLUoxetine (PROZAC) 40 MG capsule TAKE 1 CAPSULE BY MOUTH ONCE DAILY 90 capsule 1   glipiZIDE (GLUCOTROL) 10 MG tablet Take 1 tablet (10 mg total) by mouth 2 (two) times daily before a meal. 180 tablet 0   indomethacin (INDOCIN) 50 MG capsule Take 1 capsule (50 mg total) by mouth 2 (two) times daily with a meal. 60 capsule 1   lamoTRIgine (LAMICTAL) 100 MG tablet TAKE 1 TABLET BY MOUTH EVERY MORNING 90 tablet 0   lisinopril-hydrochlorothiazide (ZESTORETIC) 20-12.5 MG tablet Take 2 tablets by mouth daily. 180 tablet 2   OZEMPIC, 0.25 OR 0.5 MG/DOSE, 2 MG/3ML SOPN Inject 0.25 mg into the skin once a week. 9 mL 0   pantoprazole (PROTONIX) 40 MG tablet TAKE 1 TABLET BY MOUTH ONCE A DAY 90 tablet 0   potassium chloride (KLOR-CON) 10 MEQ tablet TAKE 1 TABLET BY MOUTH ONCE A DAY (NEED OFFICE VISIT FOR REFILLS) 90 tablet 2   predniSONE (DELTASONE) 10 MG tablet Take 6 tabs on day 1, 5 tabs on  day 2, 4 tabs on day 3, 3 tabs on day 4, 2 tabs on day 5, 1 tab on day 6 21 tablet 0   rosuvastatin (CRESTOR) 10 MG tablet Take 1 tablet (10 mg total) by mouth daily. 90 tablet 2   sitaGLIPtin (JANUVIA) 100 MG tablet Take 1 tablet (100 mg total) by mouth daily. 90 tablet 1   tamsulosin (FLOMAX) 0.4 MG CAPS capsule TAKE 1 CAPSULE BY MOUTH ONCE DAILY 90 capsule 1   tizanidine (ZANAFLEX) 6 MG capsule Take 1 capsule (6 mg total) by mouth 3 (three) times daily as needed for muscle spasms. 20 capsule 0   No current facility-administered medications for this visit.    Allergies  Allergen Reactions   Penicillins     REACTION: unspecified    Family History  Problem Relation Age of Onset   Cancer Mother        lymphoma   Heart disease Father    Hypertension Father    Diabetes Sister    Stroke  Paternal Uncle    Diabetes Maternal Grandmother    Arthritis Paternal Grandmother    Heart disease Paternal Grandfather    Migraines Daughter    Irritable bowel syndrome Daughter    Heart Problems Son        valve     Social History   Socioeconomic History   Marital status: Married    Spouse name: Not on file   Number of children: Not on file   Years of education: Not on file   Highest education level: Not on file  Occupational History   Not on file  Tobacco Use   Smoking status: Never   Smokeless tobacco: Current    Types: Chew   Tobacco comments:    has been using chew 40 years  Vaping Use   Vaping Use: Never used  Substance and Sexual Activity   Alcohol use: Yes    Comment: rare   Drug use: Never   Sexual activity: Yes  Other Topics Concern   Not on file  Social History Narrative   Not on file   Social Determinants of Health   Financial Resource Strain: Not on file  Food Insecurity: Not on file  Transportation Needs: Not on file  Physical Activity: Not on file  Stress: Not on file  Social Connections: Not on file  Intimate Partner Violence: Not on file     Constitutional: Denies fever, malaise, fatigue, headache or abrupt weight changes.  HEENT: Denies eye pain, eye redness, ear pain, ringing in the ears, wax buildup, runny nose, nasal congestion, bloody nose, or sore throat. Respiratory: Denies difficulty breathing, shortness of breath, cough or sputum production.   Cardiovascular: Denies chest pain, chest tightness, palpitations or swelling in the hands or feet.  Gastrointestinal: Patient reports increased thirst.  Denies abdominal pain, bloating, constipation, diarrhea or blood in the stool.  GU: Patient reports urinary frequency.  Denies urgency, pain with urination, burning sensation, blood in urine, odor or discharge. Musculoskeletal: Patient reports chronic joint pain.  Denies decrease in range of motion, difficulty with gait, muscle pain or joint  pain and swelling.  Skin: Patient reports rash of mouth.  Denies redness, lesions or ulcercations.  Neurological: Patient reports neuropathic pain.  Denies dizziness, difficulty with memory, difficulty with speech or problems with balance and coordination.    No other specific complaints in a complete review of systems (except as listed in HPI above).  Objective:   Physical Exam  BP 106/74 (  BP Location: Right Arm, Patient Position: Sitting, Cuff Size: Large)   Pulse (!) 126   Temp (!) 96.8 F (36 C) (Temporal)   Wt (!) 346 lb (156.9 kg)   SpO2 97%   BMI 46.93 kg/m   Wt Readings from Last 3 Encounters:  02/05/22 (!) 345 lb (156.5 kg)  12/09/21 (!) 345 lb (156.5 kg)  09/08/21 (!) 350 lb (158.8 kg)    General: Appears his stated age, obese in NAD. Skin: Warm, dry and intact.  No rash noted at corners of mouth.  No ulcerations noted. HEENT: Head: normal shape and size; Eyes: sclera white, no icterus, conjunctiva pink, PERRLA and EOMs intact;  Cardiovascular: Normal rate and rhythm. S1,S2 noted.  No murmur, rubs or gallops noted. No JVD or BLE edema. No carotid bruits noted. Pulmonary/Chest: Normal effort and positive vesicular breath sounds. No respiratory distress. No wheezes, rales or ronchi noted.  Musculoskeletal: No difficulty with gait.  Neurological: Alert and oriented.     BMET    Component Value Date/Time   NA 134 (L) 12/09/2021 0832   K 3.9 12/09/2021 0832   CL 98 12/09/2021 0832   CO2 26 12/09/2021 0832   GLUCOSE 252 (H) 12/09/2021 0832   BUN 12 12/09/2021 0832   CREATININE 1.08 12/09/2021 0832   CALCIUM 9.2 12/09/2021 0832   GFRNONAA 69 08/05/2020 0955   GFRAA 79 08/05/2020 0955    Lipid Panel     Component Value Date/Time   CHOL 91 12/09/2021 0832   TRIG 195 (H) 12/09/2021 0832   HDL 29 (L) 12/09/2021 0832   CHOLHDL 3.1 12/09/2021 0832   VLDL 28.0 01/23/2020 1413   LDLCALC 35 12/09/2021 0832    CBC    Component Value Date/Time   WBC 5.5  04/15/2021 1024   WBC 5.6 03/23/2021 1014   RBC 5.07 04/15/2021 1024   RBC 5.20 03/23/2021 1014   HGB 13.7 04/15/2021 1024   HCT 40.3 04/15/2021 1024   PLT 229 04/15/2021 1024   MCV 80 04/15/2021 1024   MCH 27.0 04/15/2021 1024   MCH 26.5 (L) 03/23/2021 1014   MCHC 34.0 04/15/2021 1024   MCHC 32.2 03/23/2021 1014   RDW 13.1 04/15/2021 1024   LYMPHSABS 1.3 04/15/2021 1024   EOSABS 0.1 04/15/2021 1024   BASOSABS 0.1 04/15/2021 1024    Hgb A1C Lab Results  Component Value Date   HGBA1C 10.3 12/09/2021            Assessment & Plan:   Angular Ceilitis:  Rx for Nystatin-Triamcinolone cream twice daily as needed  RTC in 3 months for your annual exam Webb Silversmith, NP

## 2022-04-15 NOTE — Assessment & Plan Note (Signed)
Encourage weight loss as this can reduce sleep apnea symptoms Noncompliant with CPAP

## 2022-04-15 NOTE — Assessment & Plan Note (Signed)
Encourage diet and exercise for weight loss 

## 2022-04-15 NOTE — Assessment & Plan Note (Signed)
POCT A1c 9.7% We will check urine microalbumin visit in 3 months Continue glipizide and Januvia He declines adding additional medication at this time despite known risk for increased heart attack, stroke, amputations and death Will request copy of eye exam Encouraged routine foot exam Flu shot UTD Pneumovax UTD Encouraged him to get his COVID booster

## 2022-04-15 NOTE — Patient Instructions (Signed)

## 2022-04-19 ENCOUNTER — Other Ambulatory Visit: Payer: Self-pay

## 2022-04-19 NOTE — Telephone Encounter (Signed)
Copied from [place] [id number]. Topic: General - Inquiry >> Apr 19, 2022 10:49 AM Penni Bombard wrote: Reason for CRM: pt's wife called saying the patient is not taking the Ozempic.Marland Kitchen He is taking the [place] and Glipizide.  She wanted to update his meds because she go a list that says he is taking the Ozempic/   He is not taking the Wellbutrin but she thinks he needs a prescription for it.  She also needs to talk about a sensor he has on his stomach.  She thinks he needs to switch this to the one he use to use.  CB#  [phone]

## 2022-04-27 MED ORDER — BUPROPION HCL ER (SR) 150 MG PO TB12: 150.0000 mg | ORAL_TABLET | Freq: Every day | ORAL | 1 refills | Status: DC

## 2022-04-27 NOTE — Telephone Encounter (Signed)
Since he does not manage his medications and has no idea what he is taking, it would be a good idea if she came with him to his appointments

## 2022-05-10 DIAGNOSIS — M1711 Unilateral primary osteoarthritis, right knee: Secondary | ICD-10-CM | POA: Diagnosis not present

## 2022-05-10 DIAGNOSIS — M170 Bilateral primary osteoarthritis of knee: Secondary | ICD-10-CM | POA: Diagnosis not present

## 2022-05-11 ENCOUNTER — Other Ambulatory Visit: Payer: Self-pay | Admitting: Internal Medicine

## 2022-05-11 NOTE — Telephone Encounter (Signed)
Requested Prescriptions  Pending Prescriptions Disp Refills   pantoprazole (PROTONIX) 40 MG tablet [Pharmacy Med Name: PANTOPRAZOLE SODIUM '40MG'$  TABLET DR] 90 tablet 0    Sig: TAKE ONE TABLET BY MOUTH ONCE A DAY     Gastroenterology: Proton Pump Inhibitors Passed - 05/11/2022  8:07 AM      Passed - Valid encounter within last 12 months    Recent Outpatient Visits           3 weeks ago Type 2 diabetes mellitus without complication, without long-term current use of insulin [facility])   [facility] [place], [place] W, NP   3 months ago Right lower quadrant abdominal pain   [facility] [place], Coralie Keens, NP   5 months ago Need for influenza vaccination   [facility] [place], Coralie Keens, NP   12 months ago Diabetic ulcer of left fifth toe [facility])   [facility] [place], Coralie Keens, NP   1 year ago Encounter for general adult medical examination with abnormal findings   [facility] [place], Coralie Keens, NP

## 2022-05-19 ENCOUNTER — Other Ambulatory Visit: Payer: Self-pay | Admitting: Internal Medicine

## 2022-05-20 NOTE — Telephone Encounter (Signed)
Requested Prescriptions  Pending Prescriptions Disp Refills   glipiZIDE (GLUCOTROL) 10 MG tablet [Pharmacy Med Name: GLIPIZIDE 10MG  TABLET] 180 tablet 0    Sig: TAKE ONE TABLET BY MOUTH TWICE A DAY BEFORE A MEAL.     Endocrinology:  Diabetes - Sulfonylureas Failed - 05/19/2022  8:59 AM      Failed - HBA1C is between 0 and 7.9 and within 180 days    Hemoglobin A1C  Date Value Ref Range Status  04/15/2022 9.7 (A) 4.0 - 5.6 % Final   HbA1c POC (<> result, manual entry)  Date Value Ref Range Status  12/09/2021 10.3 4.0 - 5.6 % Final         Passed - Cr in normal range and within 360 days    Creat  Date Value Ref Range Status  12/09/2021 1.08 0.70 - 1.30 mg/dL Final   Creatinine, Urine  Date Value Ref Range Status  03/23/2021 58 20 - 320 mg/dL Final         Passed - Valid encounter within last 6 months    Recent Outpatient Visits           1 month ago Type 2 diabetes mellitus without complication, without long-term current use of insulin ([place])   [facility] [place], [place], [place]   3 months ago Right lower quadrant abdominal pain   [facility] [place], [place] W, NP   5 months ago Need for influenza vaccination   [facility] [place], Coralie Keens, NP   1 year ago Diabetic ulcer of left fifth toe [facility])   [facility] [place], Coralie Keens, NP   1 year ago Encounter for general adult medical examination with abnormal findings   [facility] [address], Coralie Keens, NP               lamoTRIgine (LAMICTAL) 100 MG tablet [Pharmacy Med Name: LAMOTRIGINE 100MG  TABLET] 90 tablet 0    Sig: TAKE ONE TABLET BY MOUTH EVERY MORNING     Neurology:  Anticonvulsants - lamotrigine Passed - 05/19/2022  8:59 AM      Passed - Cr in normal range and within 360 days    Creat  Date Value Ref Range Status  12/09/2021 1.08 0.70 - 1.30 mg/dL Final   Creatinine, Urine  Date Value  Ref Range Status  03/23/2021 58 20 - 320 mg/dL Final         Passed - ALT in normal range and within 360 days    ALT  Date Value Ref Range Status  12/09/2021 29 9 - 46 U/L Final         Passed - AST in normal range and within 360 days    AST  Date Value Ref Range Status  12/09/2021 31 10 - 35 U/L Final         Passed - Completed PHQ-2 or PHQ-9 in the last 360 days      Passed - Valid encounter within last 12 months    Recent Outpatient Visits           1 month ago Type 2 diabetes mellitus without complication, without long-term current use of insulin [facility])   [facility] [place], [place] W, NP   3 months ago Right lower quadrant abdominal pain   [place], Coralie Keens, NP   5 months ago  Need for influenza vaccination   [facility] [place], Coralie Keens, NP   1 year ago Diabetic ulcer of left fifth toe [facility])   [facility] [place], Coralie Keens, NP   1 year ago Encounter for general adult medical examination with abnormal findings   [facility] [place], Coralie Keens, NP

## 2022-05-21 ENCOUNTER — Other Ambulatory Visit: Payer: Self-pay | Admitting: Internal Medicine

## 2022-05-22 DIAGNOSIS — M1712 Unilateral primary osteoarthritis, left knee: Secondary | ICD-10-CM | POA: Insufficient documentation

## 2022-05-24 ENCOUNTER — Other Ambulatory Visit: Payer: Self-pay | Admitting: Internal Medicine

## 2022-05-24 DIAGNOSIS — M791 Myalgia, unspecified site: Secondary | ICD-10-CM

## 2022-05-24 DIAGNOSIS — M2550 Pain in unspecified joint: Secondary | ICD-10-CM

## 2022-05-24 NOTE — Telephone Encounter (Signed)
Requested Prescriptions  Pending Prescriptions Disp Refills   glipiZIDE (GLUCOTROL) 10 MG tablet [Pharmacy Med Name: GLIPIZIDE 10MG  TABLET] 180 tablet 0    Sig: TAKE ONE TABLET BY MOUTH TWICE A DAY BEFORE A MEAL.     Endocrinology:  Diabetes - Sulfonylureas Failed - 05/21/2022  9:35 AM      Failed - HBA1C is between 0 and 7.9 and within 180 days    Hemoglobin A1C  Date Value Ref Range Status  04/15/2022 9.7 (A) 4.0 - 5.6 % Final   HbA1c POC (<> result, manual entry)  Date Value Ref Range Status  12/09/2021 10.3 4.0 - 5.6 % Final         Passed - Cr in normal range and within 360 days    Creat  Date Value Ref Range Status  12/09/2021 1.08 0.70 - 1.30 mg/dL Final   Creatinine, Urine  Date Value Ref Range Status  03/23/2021 58 20 - 320 mg/dL Final         Passed - Valid encounter within last 6 months    Recent Outpatient Visits           1 month ago Type 2 diabetes mellitus without complication, without long-term current use of insulin [facility])   [facility] [place], [place] W, NP   3 months ago Right lower quadrant abdominal pain   [facility] [place], Coralie Keens, NP   5 months ago Need for influenza vaccination   [facility] [place], Coralie Keens, NP   1 year ago Diabetic ulcer of left fifth toe [facility])   [facility] [place], Coralie Keens, NP   1 year ago Encounter for general adult medical examination with abnormal findings   [facility] [place], Coralie Keens, NP

## 2022-05-25 NOTE — Telephone Encounter (Signed)
Requested medication (s) are due for refill today:yes  Requested medication (s) are on the active medication list: yes    Last refill: 02/17/22  #180   0  refills  Future visit scheduled no  Notes to clinic:Failed due to labs, please review. Thank you.  Requested Prescriptions  Pending Prescriptions Disp Refills   celecoxib (CELEBREX) 100 MG capsule [Pharmacy Med Name: CELECOXIB 100MG  CAPSULE] 180 capsule 0    Sig: TAKE ONE CAPSULE BY MOUTH TWICE DAILY     Analgesics:  COX2 Inhibitors Failed - 05/24/2022  9:54 PM      Failed - Manual Review: Labs are only required if the patient has taken medication for more than 8 weeks.      Failed - HGB in normal range and within 360 days    Hemoglobin  Date Value Ref Range Status  04/15/2021 13.7 13.0 - 17.7 g/dL Final         Failed - HCT in normal range and within 360 days    Hematocrit  Date Value Ref Range Status  04/15/2021 40.3 37.5 - 51.0 % Final         Passed - Cr in normal range and within 360 days    Creat  Date Value Ref Range Status  12/09/2021 1.08 0.70 - 1.30 mg/dL Final   Creatinine, Urine  Date Value Ref Range Status  03/23/2021 58 20 - 320 mg/dL Final         Passed - AST in normal range and within 360 days    AST  Date Value Ref Range Status  12/09/2021 31 10 - 35 U/L Final         Passed - ALT in normal range and within 360 days    ALT  Date Value Ref Range Status  12/09/2021 29 9 - 46 U/L Final         Passed - eGFR is 30 or above and within 360 days    GFR, Est African American  Date Value Ref Range Status  08/05/2020 79 > OR = 60 mL/min/1.6m2 Final   GFR, Est Non African American  Date Value Ref Range Status  08/05/2020 69 > OR = 60 mL/min/1.43m2 Final   GFR  Date Value Ref Range Status  01/23/2020 67.67 >60.00 mL/min Final    Comment:    Calculated using the CKD-EPI Creatinine Equation (2021)   eGFR  Date Value Ref Range Status  12/09/2021 80 > OR = 60 mL/min/1.30m2 Final          Passed - Patient is not pregnant      Passed - Valid encounter within last 12 months    Recent Outpatient Visits           1 month ago Type 2 diabetes mellitus without complication, without long-term current use of insulin [facility])   [facility] [place], [place], NP   3 months ago Right lower quadrant abdominal pain   [facility] [place], Coralie Keens, NP   5 months ago Need for influenza vaccination   [facility] [place], Coralie Keens, NP   1 year ago Diabetic ulcer of left fifth toe [facility])   [facility] [place], Coralie Keens, NP   1 year ago Encounter for general adult medical examination with abnormal findings   [facility] [place], Coralie Keens, NP  Signed Prescriptions Disp Refills   FLUoxetine (PROZAC) 40 MG capsule 90 capsule 0    Sig: TAKE ONE CAPSULE BY MOUTH ONCE DAILY     Psychiatry:  Antidepressants - SSRI Passed - 05/24/2022  9:54 PM      Passed - Completed PHQ-2 or PHQ-9 in the last 360 days      Passed - Valid encounter within last 6 months    Recent Outpatient Visits           1 month ago Type 2 diabetes mellitus without complication, without long-term current use of insulin [facility])   [facility] [place], [place], NP   3 months ago Right lower quadrant abdominal pain   [facility] [place], Coralie Keens, NP   5 months ago Need for influenza vaccination   [facility] [place], Coralie Keens, NP   1 year ago Diabetic ulcer of left fifth toe [facility])   [facility] [place], Coralie Keens, NP   1 year ago Encounter for general adult medical examination with abnormal findings   [facility] [place], Coralie Keens, NP

## 2022-05-25 NOTE — Telephone Encounter (Signed)
Requested Prescriptions  Pending Prescriptions Disp Refills   celecoxib (CELEBREX) 100 MG capsule [Pharmacy Med Name: CELECOXIB 100MG  CAPSULE] 180 capsule 0    Sig: TAKE ONE CAPSULE BY MOUTH TWICE DAILY     Analgesics:  COX2 Inhibitors Failed - 05/24/2022  9:54 PM      Failed - Manual Review: Labs are only required if the patient has taken medication for more than 8 weeks.      Failed - HGB in normal range and within 360 days    Hemoglobin  Date Value Ref Range Status  04/15/2021 13.7 13.0 - 17.7 g/dL Final         Failed - HCT in normal range and within 360 days    Hematocrit  Date Value Ref Range Status  04/15/2021 40.3 37.5 - 51.0 % Final         Passed - Cr in normal range and within 360 days    Creat  Date Value Ref Range Status  12/09/2021 1.08 0.70 - 1.30 mg/dL Final   Creatinine, Urine  Date Value Ref Range Status  03/23/2021 58 20 - 320 mg/dL Final         Passed - AST in normal range and within 360 days    AST  Date Value Ref Range Status  12/09/2021 31 10 - 35 U/L Final         Passed - ALT in normal range and within 360 days    ALT  Date Value Ref Range Status  12/09/2021 29 9 - 46 U/L Final         Passed - eGFR is 30 or above and within 360 days    GFR, Est African American  Date Value Ref Range Status  08/05/2020 79 > OR = 60 mL/min/1.50m2 Final   GFR, Est Non African American  Date Value Ref Range Status  08/05/2020 69 > OR = 60 mL/min/1.66m2 Final   GFR  Date Value Ref Range Status  01/23/2020 67.67 >60.00 mL/min Final    Comment:    Calculated using the CKD-EPI Creatinine Equation (2021)   eGFR  Date Value Ref Range Status  12/09/2021 80 > OR = 60 mL/min/1.96m2 Final         Passed - Patient is not pregnant      Passed - Valid encounter within last 12 months    Recent Outpatient Visits           1 month ago Type 2 diabetes mellitus without complication, without long-term current use of insulin ([place])   [facility] [place], [place] W, NP   3 months ago Right lower quadrant abdominal pain   [facility] [place], Coralie Keens, NP   5 months ago Need for influenza vaccination   [facility] [place], Coralie Keens, NP   1 year ago Diabetic ulcer of left fifth toe [facility])   [facility] [place], Coralie Keens, NP   1 year ago Encounter for general adult medical examination with abnormal findings   [facility] [place], Coralie Keens, NP               FLUoxetine (PROZAC) 40 MG capsule [Pharmacy Med Name: FLUOXETINE HYDROCHLORIDE 40MG  CAPSULE] 90 capsule 1    Sig: TAKE ONE CAPSULE BY MOUTH ONCE DAILY     Psychiatry:  Antidepressants - SSRI Passed - 05/24/2022  9:54 PM  Passed - Completed PHQ-2 or PHQ-9 in the last 360 days      Passed - Valid encounter within last 6 months    Recent Outpatient Visits           1 month ago Type 2 diabetes mellitus without complication, without long-term current use of insulin [facility])   [facility] [place], [place] W, NP   3 months ago Right lower quadrant abdominal pain   [facility] [place], Coralie Keens, NP   5 months ago Need for influenza vaccination   [facility] [place], Coralie Keens, NP   1 year ago Diabetic ulcer of left fifth toe [facility])   [facility] [place], Coralie Keens, NP   1 year ago Encounter for general adult medical examination with abnormal findings   [facility] [place], Coralie Keens, NP

## 2022-05-28 ENCOUNTER — Other Ambulatory Visit: Payer: Self-pay | Admitting: Internal Medicine

## 2022-05-28 NOTE — Telephone Encounter (Signed)
Requested Prescriptions  Pending Prescriptions Disp Refills   JANUVIA 100 MG tablet [Pharmacy Med Name: JANUVIA 100MG  TABLET] 90 tablet 1    Sig: TAKE ONE TABLET BY MOUTH ONCE A DAY     Endocrinology:  Diabetes - DPP-4 Inhibitors Failed - 05/28/2022  1:00 PM      Failed - HBA1C is between 0 and 7.9 and within 180 days    Hemoglobin A1C  Date Value Ref Range Status  04/15/2022 9.7 (A) 4.0 - 5.6 % Final   HbA1c POC (<> result, manual entry)  Date Value Ref Range Status  12/09/2021 10.3 4.0 - 5.6 % Final         Passed - Cr in normal range and within 360 days    Creat  Date Value Ref Range Status  12/09/2021 1.08 0.70 - 1.30 mg/dL Final   Creatinine, Urine  Date Value Ref Range Status  03/23/2021 58 20 - 320 mg/dL Final         Passed - Valid encounter within last 6 months    Recent Outpatient Visits           1 month ago Type 2 diabetes mellitus without complication, without long-term current use of insulin [facility])   [facility] [place], Coralie Keens, NP   3 months ago Right lower quadrant abdominal pain   [facility] [place], Coralie Keens, NP   5 months ago Need for influenza vaccination   [facility] [place], Coralie Keens, NP   1 year ago Diabetic ulcer of left fifth toe [facility])   [facility] [place], Coralie Keens, NP   1 year ago Encounter for general adult medical examination with abnormal findings   [facility], Coralie Keens, NP

## 2022-06-01 ENCOUNTER — Other Ambulatory Visit: Payer: Self-pay | Admitting: Internal Medicine

## 2022-06-01 NOTE — Telephone Encounter (Signed)
Rx 12/24/21 #90 [id number] soon Requested Prescriptions  Pending Prescriptions Disp Refills   JANUVIA 100 MG tablet [Pharmacy Med Name: JANUVIA 100MG  TABLET] 90 tablet 1    Sig: TAKE ONE TABLET BY MOUTH ONCE A DAY     Endocrinology:  Diabetes - DPP-4 Inhibitors Failed - 06/01/2022 11:08 AM      Failed - HBA1C is between 0 and 7.9 and within 180 days    Hemoglobin A1C  Date Value Ref Range Status  04/15/2022 9.7 (A) 4.0 - 5.6 % Final   HbA1c POC (<> result, manual entry)  Date Value Ref Range Status  12/09/2021 10.3 4.0 - 5.6 % Final         Passed - Cr in normal range and within 360 days    Creat  Date Value Ref Range Status  12/09/2021 1.08 0.70 - 1.30 mg/dL Final   Creatinine, Urine  Date Value Ref Range Status  03/23/2021 58 20 - 320 mg/dL Final         Passed - Valid encounter within last 6 months    Recent Outpatient Visits           1 month ago Type 2 diabetes mellitus without complication, without long-term current use of insulin [facility])   [facility] [place], Coralie Keens, NP   3 months ago Right lower quadrant abdominal pain   [facility] [place], Coralie Keens, NP   5 months ago Need for influenza vaccination   [facility] [place], Coralie Keens, NP   1 year ago Diabetic ulcer of left fifth toe [facility])   [facility] [place], Coralie Keens, NP   1 year ago Encounter for general adult medical examination with abnormal findings   [facility] [place], Coralie Keens, NP

## 2022-06-16 ENCOUNTER — Other Ambulatory Visit: Payer: Self-pay | Admitting: Cardiovascular Disease

## 2022-06-16 DIAGNOSIS — I10 Essential (primary) hypertension: Secondary | ICD-10-CM

## 2022-06-23 ENCOUNTER — Other Ambulatory Visit: Payer: Self-pay | Admitting: Cardiovascular Disease

## 2022-06-23 ENCOUNTER — Other Ambulatory Visit: Payer: Self-pay | Admitting: Internal Medicine

## 2022-06-23 DIAGNOSIS — I10 Essential (primary) hypertension: Secondary | ICD-10-CM

## 2022-06-23 NOTE — Telephone Encounter (Signed)
Requested Prescriptions  Pending Prescriptions Disp Refills   tamsulosin (FLOMAX) 0.4 MG CAPS capsule [Pharmacy Med Name: TAMSULOSIN HYDROCHLORIDE 0.4MG  CAPSULE] 90 capsule 0    Sig: TAKE ONE CAPSULE BY MOUTH ONCE DAILY     Urology: Alpha-Adrenergic Blocker Failed - 06/23/2022 12:42 PM      Failed - PSA in normal range and within 360 days    PSA  Date Value Ref Range Status  03/23/2021 0.58 < OR = 4.00 ng/mL Final    Comment:    The total PSA value from this assay system is  standardized against the WHO standard. The test  result will be approximately 20% lower when compared  to the equimolar-standardized total PSA (Beckman  Coulter). Comparison of serial PSA results should be  interpreted with this fact in mind. . This test was performed using the Siemens  chemiluminescent method. Values obtained from  different assay methods cannot be used interchangeably. PSA levels, regardless of value, should not be interpreted as absolute evidence of the presence or absence of disease.          Passed - Last BP in normal range    BP Readings from Last 1 Encounters:  04/15/22 106/74         Passed - Valid encounter within last 12 months    Recent Outpatient Visits           2 months ago Type 2 diabetes mellitus without complication, without long-term current use of insulin [facility])   [place] [facility] [place], [place] W, NP   4 months ago Right lower quadrant abdominal pain   [facility] [place], Salvadore Oxford, NP   6 months ago Need for influenza vaccination   [facility] [place], Salvadore Oxford, NP   1 year ago Diabetic ulcer of left fifth toe [facility])   [place] [facility] [place], Salvadore Oxford, NP   1 year ago Encounter for general adult medical examination with abnormal findings   [place] [facility] [place], Salvadore Oxford, NP

## 2022-06-30 ENCOUNTER — Other Ambulatory Visit: Payer: Self-pay | Admitting: Internal Medicine

## 2022-07-01 NOTE — Telephone Encounter (Signed)
Labs in date  Requested Prescriptions  Pending Prescriptions Disp Refills   JANUVIA 100 MG tablet [Pharmacy Med Name: JANUVIA 100MG  TABLET] 90 tablet 1    Sig: TAKE ONE TABLET BY MOUTH ONCE A DAY     Endocrinology:  Diabetes - DPP-4 Inhibitors Failed - 06/30/2022 12:36 PM      Failed - HBA1C is between 0 and 7.9 and within 180 days    Hemoglobin A1C  Date Value Ref Range Status  04/15/2022 9.7 (A) 4.0 - 5.6 % Final   HbA1c POC (<> result, manual entry)  Date Value Ref Range Status  12/09/2021 10.3 4.0 - 5.6 % Final         Passed - Cr in normal range and within 360 days    Creat  Date Value Ref Range Status  12/09/2021 1.08 0.70 - 1.30 mg/dL Final   Creatinine, Urine  Date Value Ref Range Status  03/23/2021 58 20 - 320 mg/dL Final         Passed - Valid encounter within last 6 months    Recent Outpatient Visits           2 months ago Type 2 diabetes mellitus without complication, without long-term current use of insulin [facility])   [facility] [place], Salvadore Oxford, NP   4 months ago Right lower quadrant abdominal pain   [facility] [place], Salvadore Oxford, NP   6 months ago Need for influenza vaccination   [place] [facility] [place], Salvadore Oxford, NP   1 year ago Diabetic ulcer of left fifth toe [facility])   [facility] [place], Salvadore Oxford, NP   1 year ago Encounter for general adult medical examination with abnormal findings   [facility] [place], Salvadore Oxford, NP

## 2022-07-05 ENCOUNTER — Encounter: Payer: Self-pay | Admitting: Internal Medicine

## 2022-07-05 ENCOUNTER — Ambulatory Visit (INDEPENDENT_AMBULATORY_CARE_PROVIDER_SITE_OTHER): Payer: 59 | Admitting: Internal Medicine

## 2022-07-05 ENCOUNTER — Telehealth: Payer: Self-pay

## 2022-07-05 VITALS — BP 134/76 | HR 105 | Temp 96.1°F | Wt 345.0 lb

## 2022-07-05 DIAGNOSIS — E11628 Type 2 diabetes mellitus with other skin complications: Secondary | ICD-10-CM

## 2022-07-05 DIAGNOSIS — H6122 Impacted cerumen, left ear: Secondary | ICD-10-CM

## 2022-07-05 DIAGNOSIS — Z6841 Body Mass Index (BMI) 40.0 and over, adult: Secondary | ICD-10-CM | POA: Diagnosis not present

## 2022-07-05 LAB — HM DIABETES EYE EXAM

## 2022-07-05 MED ORDER — DEXCOM G7 SENSOR MISC
1.0000 | 0 refills | Status: DC
Start: 2022-07-05 — End: 2022-07-05

## 2022-07-05 MED ORDER — DEXCOM G7 SENSOR MISC
0 refills | Status: DC
Start: 2022-07-05 — End: 2022-11-04

## 2022-07-05 NOTE — Telephone Encounter (Signed)
Copied from CRM [id number]. Topic: General - Other >> Jul 05, 2022  1:46 PM Ja-Kwan M wrote: Reason for CRM: Thayer Ohm with [facility] called to make the provider aware that the [facility] G7 sensor is for 10 days with a 12 hour grace period. Thayer Ohm asked that a new Rx be submitted to reflect. Cb# [id number]

## 2022-07-05 NOTE — Telephone Encounter (Signed)
Updated Rx submitted 

## 2022-07-05 NOTE — Assessment & Plan Note (Signed)
Encourage diet and exercise for weight loss 

## 2022-07-05 NOTE — Addendum Note (Signed)
Addended by: Lorre Munroe on: 07/05/2022 01:58 PM   Modules accepted: Orders

## 2022-07-05 NOTE — Progress Notes (Signed)
Subjective:    Patient ID: Anthony Castillo, male    DOB: 08/12/1965, 57 y.o.   MRN: [id number]  HPI  Patient presents to clinic today with complaint of left ear pain.  This started about 2-3 days ago.  He describes the pain as a dull and achy.  He has had some decreased hearing.  He denies headache, runny nose, nasal congestion, sore throat or cough.  He denies fever, chills or body aches.  He has not tried anything OTC for this  He would also like to get on Dexcom for glucose monitoring.  His last A1c was 9.7%, 04/2022.  He is taking his medications as prescribed.  He is not currently monitoring his sugars.  Review of Systems     Past Medical History:  Diagnosis Date   Chronic pain 04/09/2011   DEPRESSION 03/16/2007   GERD (gastroesophageal reflux disease)    HYPERTENSION 09/22/2006   Hypogonadism male 04/09/2011   Morbid obesity (HCC) 09/22/2006   Osteoarthritis 04/09/2011   Sleep apnea    uses a cpap-will use after surgery   Wears glasses     Current Outpatient Medications  Medication Sig Dispense Refill   Acetaminophen (TYLENOL ARTHRITIS PAIN PO) Take by mouth as needed.     amLODipine (NORVASC) 10 MG tablet Take 1 tablet (10 mg total) by mouth daily. 90 tablet 2   augmented betamethasone dipropionate (DIPROLENE-AF) 0.05 % ointment Apply topically.     buPROPion (WELLBUTRIN SR) 150 MG 12 hr tablet Take 1 tablet (150 mg total) by mouth daily. 90 tablet 1   celecoxib (CELEBREX) 100 MG capsule TAKE ONE CAPSULE BY MOUTH TWICE DAILY 180 capsule 0   cetirizine (ZYRTEC) 10 MG tablet Take 10 mg by mouth daily.     Cholecalciferol (VITAMIN D3) 1000 units CAPS Take 1 capsule by mouth daily.     ezetimibe (ZETIA) 10 MG tablet Take 1 tablet (10 mg total) by mouth daily. 90 tablet 2   fluocinonide cream (LIDEX) 0.05 % as needed.   1   FLUoxetine (PROZAC) 40 MG capsule TAKE ONE CAPSULE BY MOUTH ONCE DAILY 90 capsule 0   glipiZIDE (GLUCOTROL) 10 MG tablet TAKE ONE TABLET BY MOUTH TWICE A DAY  BEFORE A MEAL. 180 tablet 0   indomethacin (INDOCIN) 50 MG capsule Take 1 capsule (50 mg total) by mouth 2 (two) times daily with a meal. 60 capsule 1   lamoTRIgine (LAMICTAL) 100 MG tablet TAKE ONE TABLET BY MOUTH EVERY MORNING 90 tablet 0   lisinopril-hydrochlorothiazide (ZESTORETIC) 20-12.5 MG tablet Take 2 tablets by mouth daily. Patient will need appointment with provider for future refills. 180 tablet 0   nystatin-triamcinolone ointment (MYCOLOG) Apply 1 Application topically 2 (two) times daily. 30 g 0   pantoprazole (PROTONIX) 40 MG tablet TAKE ONE TABLET BY MOUTH ONCE A DAY 90 tablet 0   potassium chloride (KLOR-CON) 10 MEQ tablet TAKE ONE TABLET BY MOUTH ONCE A DAY 90 tablet 1   rosuvastatin (CRESTOR) 10 MG tablet Take 1 tablet (10 mg total) by mouth daily. 90 tablet 2   sitaGLIPtin (JANUVIA) 100 MG tablet TAKE ONE TABLET BY MOUTH ONCE A DAY 90 tablet 0   tamsulosin (FLOMAX) 0.4 MG CAPS capsule TAKE ONE CAPSULE BY MOUTH ONCE DAILY 90 capsule 0   tizanidine (ZANAFLEX) 6 MG capsule Take 1 capsule (6 mg total) by mouth 3 (three) times daily as needed for muscle spasms. 20 capsule 0   No current facility-administered medications for this visit.  Allergies  Allergen Reactions   Penicillins     REACTION: unspecified    Family History  Problem Relation Age of Onset   Cancer Mother        lymphoma   Heart disease Father    Hypertension Father    Diabetes Sister    Stroke Paternal Uncle    Diabetes Maternal Grandmother    Arthritis Paternal Grandmother    Heart disease Paternal Grandfather    Migraines Daughter    Irritable bowel syndrome Daughter    Heart Problems Son        valve     Social History   Socioeconomic History   Marital status: Married    Spouse name: Not on file   Number of children: Not on file   Years of education: Not on file   Highest education level: Not on file  Occupational History   Not on file  Tobacco Use   Smoking status: Never    Smokeless tobacco: Current    Types: Chew   Tobacco comments:    has been using chew 40 years  Vaping Use   Vaping Use: Never used  Substance and Sexual Activity   Alcohol use: Yes    Comment: rare   Drug use: Never   Sexual activity: Yes  Other Topics Concern   Not on file  Social History Narrative   Not on file   Social Determinants of Health   Financial Resource Strain: Not on file  Food Insecurity: Not on file  Transportation Needs: Not on file  Physical Activity: Not on file  Stress: Not on file  Social Connections: Not on file  Intimate Partner Violence: Not on file     Constitutional: Denies fever, malaise, fatigue, headache or abrupt weight changes.  HEENT: Patient reports left ear pain.  Denies eye pain, eye redness, ringing in the ears, wax buildup, runny nose, nasal congestion, bloody nose, or sore throat. Respiratory: Denies difficulty breathing, shortness of breath, cough or sputum production.   Cardiovascular: Denies chest pain, chest tightness, palpitations or swelling in the hands or feet.   Neurological: Denies dizziness, difficulty with memory, difficulty with speech or problems with balance and coordination.    No other specific complaints in a complete review of systems (except as listed in HPI above).  Objective:   Physical Exam   BP 134/76 (BP Location: Left Arm, Patient Position: Sitting, Cuff Size: Large)   Pulse (!) 105   Temp (!) 96.1 F (35.6 C) (Temporal)   Wt (!) 345 lb (156.5 kg)   SpO2 98%   BMI 46.79 kg/m   Wt Readings from Last 3 Encounters:  04/15/22 (!) 346 lb (156.9 kg)  02/05/22 (!) 345 lb (156.5 kg)  12/09/21 (!) 345 lb (156.5 kg)    General: Appears his stated age, obese, in NAD. HEENT: Head: normal shape and size; Eyes: sclera white, no icterus, conjunctiva pink, PERRLA and EOMs intact; Left Ear: Cerumen impaction. Cardiovascular: Tachycardic with normal rhythm.  Pulmonary/Chest: Normal effort and positive vesicular  breath sounds. No respiratory distress. No wheezes, rales or ronchi noted.  Neurological: Alert and oriented.  Coordination normal.     BMET    Component Value Date/Time   NA 134 (L) 12/09/2021 0832   K 3.9 12/09/2021 0832   CL 98 12/09/2021 0832   CO2 26 12/09/2021 0832   GLUCOSE 252 (H) 12/09/2021 0832   BUN 12 12/09/2021 0832   CREATININE 1.08 12/09/2021 0832   CALCIUM 9.2 12/09/2021  4098   GFRNONAA 69 08/05/2020 0955   GFRAA 79 08/05/2020 0955    Lipid Panel     Component Value Date/Time   CHOL 91 12/09/2021 0832   TRIG 195 (H) 12/09/2021 0832   HDL 29 (L) 12/09/2021 0832   CHOLHDL 3.1 12/09/2021 0832   VLDL 28.0 01/23/2020 1413   LDLCALC 35 12/09/2021 0832    CBC    Component Value Date/Time   WBC 5.5 04/15/2021 1024   WBC 5.6 03/23/2021 1014   RBC 5.07 04/15/2021 1024   RBC 5.20 03/23/2021 1014   HGB 13.7 04/15/2021 1024   HCT 40.3 04/15/2021 1024   PLT 229 04/15/2021 1024   MCV 80 04/15/2021 1024   MCH 27.0 04/15/2021 1024   MCH 26.5 (L) 03/23/2021 1014   MCHC 34.0 04/15/2021 1024   MCHC 32.2 03/23/2021 1014   RDW 13.1 04/15/2021 1024   LYMPHSABS 1.3 04/15/2021 1024   EOSABS 0.1 04/15/2021 1024   BASOSABS 0.1 04/15/2021 1024    Hgb A1C Lab Results  Component Value Date   HGBA1C 9.7 (A) 04/15/2022          Assessment & Plan:   Hearing Loss due to Cerumen Impaction of Left Ear:  Manual lavage by CMA Advised him to try Debrox 2 times weekly to prevent wax buildup  RTC in 2 weeks for your annual exam Nicki Reaper, NP

## 2022-07-05 NOTE — Assessment & Plan Note (Signed)
Uncontrolled Is due for an appointment in a few weeks for repeat A1c Will order Dexcom Continue current medications for now Encourage low-carb diet and exercise for weight

## 2022-07-05 NOTE — Patient Instructions (Signed)

## 2022-07-07 ENCOUNTER — Other Ambulatory Visit: Payer: Self-pay

## 2022-07-07 MED ORDER — DEXCOM G7 RECEIVER DEVI: 0 refills | Status: DC

## 2022-07-07 NOTE — Telephone Encounter (Signed)
Copied from CRM [id number]. Topic: General - Other >> Jul 06, 2022  2:20 PM Carrielelia G wrote: Reason for CRM: [facility] calling,  patients needs a G7 Receiver

## 2022-07-09 ENCOUNTER — Other Ambulatory Visit: Payer: Self-pay | Admitting: Cardiovascular Disease

## 2022-07-09 ENCOUNTER — Encounter: Payer: Self-pay | Admitting: Internal Medicine

## 2022-07-23 ENCOUNTER — Other Ambulatory Visit: Payer: Self-pay | Admitting: Internal Medicine

## 2022-07-23 NOTE — Telephone Encounter (Signed)
Last reordered 2 months ago 05/20/22 #90   Requested Prescriptions  Refused Prescriptions Disp Refills   lamoTRIgine (LAMICTAL) 100 MG tablet [Pharmacy Med Name: LAMOTRIGINE 100MG  TABLET] 90 tablet 0    Sig: TAKE ONE TABLET BY MOUTH EVERY MORNING     Neurology:  Anticonvulsants - lamotrigine Passed - 07/23/2022 12:25 PM      Passed - Cr in normal range and within 360 days    Creat  Date Value Ref Range Status  12/09/2021 1.08 0.70 - 1.30 mg/dL Final   Creatinine, Urine  Date Value Ref Range Status  03/23/2021 58 20 - 320 mg/dL Final         Passed - ALT in normal range and within 360 days    ALT  Date Value Ref Range Status  12/09/2021 29 9 - 46 U/L Final         Passed - AST in normal range and within 360 days    AST  Date Value Ref Range Status  12/09/2021 31 10 - 35 U/L Final         Passed - Completed PHQ-2 or PHQ-9 in the last 360 days      Passed - Valid encounter within last 12 months    Recent Outpatient Visits           2 weeks ago Hearing loss due to cerumen impaction, left   [place] [facility] [place], [place] W, NP   3 months ago Type 2 diabetes mellitus without complication, without long-term current use of insulin [facility])   [place] [facility] [place], [place] W, NP   5 months ago Right lower quadrant abdominal pain   [place] [facility] [place], Salvadore Oxford, NP   7 months ago Need for influenza vaccination   [place] [facility] [place], Salvadore Oxford, NP   1 year ago Diabetic ulcer of left fifth toe [facility])   [place] [facility] [place], Salv[place], [place]

## 2022-07-28 ENCOUNTER — Other Ambulatory Visit: Payer: Self-pay | Admitting: Cardiovascular Disease

## 2022-07-28 DIAGNOSIS — E782 Mixed hyperlipidemia: Secondary | ICD-10-CM

## 2022-07-28 DIAGNOSIS — Z8249 Family history of ischemic heart disease and other diseases of the circulatory system: Secondary | ICD-10-CM

## 2022-08-04 ENCOUNTER — Encounter: Payer: Self-pay | Admitting: Internal Medicine

## 2022-08-04 ENCOUNTER — Ambulatory Visit (INDEPENDENT_AMBULATORY_CARE_PROVIDER_SITE_OTHER): Payer: 59 | Admitting: Internal Medicine

## 2022-08-04 VITALS — BP 132/76 | HR 76 | Temp 96.0°F | Ht 72.0 in | Wt 348.0 lb

## 2022-08-04 DIAGNOSIS — E11621 Type 2 diabetes mellitus with foot ulcer: Secondary | ICD-10-CM | POA: Diagnosis not present

## 2022-08-04 DIAGNOSIS — Z6841 Body Mass Index (BMI) 40.0 and over, adult: Secondary | ICD-10-CM | POA: Diagnosis not present

## 2022-08-04 DIAGNOSIS — L97509 Non-pressure chronic ulcer of other part of unspecified foot with unspecified severity: Secondary | ICD-10-CM

## 2022-08-04 DIAGNOSIS — Z125 Encounter for screening for malignant neoplasm of prostate: Secondary | ICD-10-CM | POA: Diagnosis not present

## 2022-08-04 DIAGNOSIS — Z0001 Encounter for general adult medical examination with abnormal findings: Secondary | ICD-10-CM

## 2022-08-04 DIAGNOSIS — Z207 Contact with and (suspected) exposure to pediculosis, acariasis and other infestations: Secondary | ICD-10-CM

## 2022-08-04 LAB — CBC
HCT: 45 % (ref 38.5–50.0)
Hemoglobin: 14.6 g/dL (ref 13.2–17.1)
MCH: 26.3 pg — ABNORMAL LOW (ref 27.0–33.0)
MCV: 81.1 fL (ref 80.0–100.0)
MPV: 10.6 fL (ref 7.5–12.5)
WBC: 6 10*3/uL (ref 3.8–10.8)

## 2022-08-04 NOTE — Patient Instructions (Signed)
Health Maintenance After Age 59 After age 59, you are at a higher risk for certain long-term diseases and infections as well as injuries from falls. Falls are a major cause of broken bones and head injuries in people who are older than age 59. Getting regular preventive care can help to keep you healthy and well. Preventive care includes getting regular testing and making lifestyle changes as recommended by your health care provider. Talk with your health care provider about: Which screenings and tests you should have. A screening is a test that checks for a disease when you have no symptoms. A diet and exercise plan that is right for you. What should I know about screenings and tests to prevent falls? Screening and testing are the best ways to find a health problem early. Early diagnosis and treatment give you the best chance of managing medical conditions that are common after age 59. Certain conditions and lifestyle choices may make you more likely to have a fall. Your health care provider may recommend: Regular vision checks. Poor vision and conditions such as cataracts can make you more likely to have a fall. If you wear glasses, make sure to get your prescription updated if your vision changes. Medicine review. Work with your health care provider to regularly review all of the medicines you are taking, including over-the-counter medicines. Ask your health care provider about any side effects that may make you more likely to have a fall. Tell your health care provider if any medicines that you take make you feel dizzy or sleepy. Strength and balance checks. Your health care provider may recommend certain tests to check your strength and balance while standing, walking, or changing positions. Foot health exam. Foot pain and numbness, as well as not wearing proper footwear, can make you more likely to have a fall. Screenings, including: Osteoporosis screening. Osteoporosis is a condition that causes  the bones to get weaker and break more easily. Blood pressure screening. Blood pressure changes and medicines to control blood pressure can make you feel dizzy. Depression screening. You may be more likely to have a fall if you have a fear of falling, feel depressed, or feel unable to do activities that you used to do. Alcohol use screening. Using too much alcohol can affect your balance and may make you more likely to have a fall. Follow these instructions at home: Lifestyle Do not drink alcohol if: Your health care provider tells you not to drink. If you drink alcohol: Limit how much you have to: 0-1 drink a day for women. 0-2 drinks a day for men. Know how much alcohol is in your drink. In the [place] one drink equals one 12 oz bottle of beer (355 mL), one 5 oz glass of wine (148 mL), or one 1 oz glass of hard liquor (44 mL). Do not use any products that contain nicotine or tobacco. These products include cigarettes, chewing tobacco, and vaping devices, such as e-cigarettes. If you need help quitting, ask your health care provider. Activity  Follow a regular exercise program to stay fit. This will help you maintain your balance. Ask your health care provider what types of exercise are appropriate for you. If you need a cane or walker, use it as recommended by your health care provider. Wear supportive shoes that have nonskid soles. Safety  Remove any tripping hazards, such as rugs, cords, and clutter. Install safety equipment such as grab bars in bathrooms and safety rails on stairs. Keep rooms and walkways   well-lit. General instructions Talk with your health care provider about your risks for falling. Tell your health care provider if: You fall. Be sure to tell your health care provider about all falls, even ones that seem minor. You feel dizzy, tiredness (fatigue), or off-balance. Take over-the-counter and prescription medicines only as told by your health care provider. These include  supplements. Eat a healthy diet and maintain a healthy weight. A healthy diet includes low-fat dairy products, low-fat (lean) meats, and fiber from whole grains, beans, and lots of fruits and vegetables. Stay current with your vaccines. Schedule regular health, dental, and eye exams. Summary Having a healthy lifestyle and getting preventive care can help to protect your health and wellness after age 59. Screening and testing are the best way to find a health problem early and help you avoid having a fall. Early diagnosis and treatment give you the best chance for managing medical conditions that are more common for people who are older than age 59. Falls are a major cause of broken bones and head injuries in people who are older than age 59. Take precautions to prevent a fall at home. Work with your health care provider to learn what changes you can make to improve your health and wellness and to prevent falls. This information is not intended to replace advice given to you by your health care provider. Make sure you discuss any questions you have with your health care provider. Document Revised: 07/07/2020 Document Reviewed: 07/07/2020 Elsevier Patient Education  2024 Elsevier Inc.  

## 2022-08-04 NOTE — Progress Notes (Signed)
Subjective:    Patient ID: Anthony Castillo, male    DOB: 1966-01-05, 57 y.o.   MRN: [id number]  HPI  Patient presents to clinic today for his annual exam.  Flu: 11/2021 Tetanus: 07/2016 COVID: Moderna x 2 Pneumovax: 03/2021 Prevnar: 11/2017 Shingrix: 03/2018, 09/2018 PSA screening: 03/2021 Colon screening: > 10 years ago Vision screening: annually Dentist: as needed  Diet: He does eat meat. He consumes fruits and veggies. He does eat fried foods. He drinks mostly water, soda. Exercise: Farming  Review of Systems     Past Medical History:  Diagnosis Date   Chronic pain 04/09/2011   DEPRESSION 03/16/2007   GERD (gastroesophageal reflux disease)    HYPERTENSION 09/22/2006   Hypogonadism male 04/09/2011   Morbid obesity (HCC) 09/22/2006   Osteoarthritis 04/09/2011   Sleep apnea    uses a cpap-will use after surgery   Wears glasses     Current Outpatient Medications  Medication Sig Dispense Refill   Acetaminophen (TYLENOL ARTHRITIS PAIN PO) Take by mouth as needed.     amLODipine (NORVASC) 10 MG tablet Take 1 tablet (10 mg total) by mouth daily. 90 tablet 2   augmented betamethasone dipropionate (DIPROLENE-AF) 0.05 % ointment Apply topically.     buPROPion (WELLBUTRIN SR) 150 MG 12 hr tablet Take 1 tablet (150 mg total) by mouth daily. 90 tablet 1   celecoxib (CELEBREX) 100 MG capsule TAKE ONE CAPSULE BY MOUTH TWICE DAILY 180 capsule 0   cetirizine (ZYRTEC) 10 MG tablet Take 10 mg by mouth daily.     Cholecalciferol (VITAMIN D3) 1000 units CAPS Take 1 capsule by mouth daily.     Continuous Glucose Receiver (DEXCOM G7 RECEIVER) DEVI Use to check blood sugar as needed for i type 2 diabetes DX: E11.9 1 each 0   Continuous Glucose Sensor (DEXCOM G7 SENSOR) MISC Apply 1 sensor Td every 10 days 9 each 0   ezetimibe (ZETIA) 10 MG tablet Take 1 tablet (10 mg total) by mouth daily. Please call the office to schedule yearly follow up prior to future refills 90 tablet 0   fluocinonide cream  (LIDEX) 0.05 % as needed.   1   FLUoxetine (PROZAC) 40 MG capsule TAKE ONE CAPSULE BY MOUTH ONCE DAILY 90 capsule 0   glipiZIDE (GLUCOTROL) 10 MG tablet TAKE ONE TABLET BY MOUTH TWICE A DAY BEFORE A MEAL. 180 tablet 0   indomethacin (INDOCIN) 50 MG capsule Take 1 capsule (50 mg total) by mouth 2 (two) times daily with a meal. 60 capsule 1   lamoTRIgine (LAMICTAL) 100 MG tablet TAKE ONE TABLET BY MOUTH EVERY MORNING 90 tablet 0   lisinopril-hydrochlorothiazide (ZESTORETIC) 20-12.5 MG tablet Take 2 tablets by mouth daily. Patient will need appointment with provider for future refills. 180 tablet 0   nystatin-triamcinolone ointment (MYCOLOG) Apply 1 Application topically 2 (two) times daily. 30 g 0   pantoprazole (PROTONIX) 40 MG tablet TAKE ONE TABLET BY MOUTH ONCE A DAY 90 tablet 0   potassium chloride (KLOR-CON) 10 MEQ tablet TAKE ONE TABLET BY MOUTH ONCE A DAY 90 tablet 1   rosuvastatin (CRESTOR) 10 MG tablet Take 1 tablet (10 mg total) by mouth daily. PLEASE SCHEDULE OFFICE VISIT FOR FURTHER REFILLS. THANK YOU! 90 tablet 0   sitaGLIPtin (JANUVIA) 100 MG tablet TAKE ONE TABLET BY MOUTH ONCE A DAY 90 tablet 0   tamsulosin (FLOMAX) 0.4 MG CAPS capsule TAKE ONE CAPSULE BY MOUTH ONCE DAILY 90 capsule 0   tizanidine (ZANAFLEX) 6 MG  capsule Take 1 capsule (6 mg total) by mouth 3 (three) times daily as needed for muscle spasms. 20 capsule 0   No current facility-administered medications for this visit.    Allergies  Allergen Reactions   Penicillins     REACTION: unspecified    Family History  Problem Relation Age of Onset   Cancer Mother        lymphoma   Heart disease Father    Hypertension Father    Diabetes Sister    Stroke Paternal Uncle    Diabetes Maternal Grandmother    Arthritis Paternal Grandmother    Heart disease Paternal Grandfather    Migraines Daughter    Irritable bowel syndrome Daughter    Heart Problems Son        valve     Social History   Socioeconomic History    Marital status: Married    Spouse name: Not on file   Number of children: Not on file   Years of education: Not on file   Highest education level: Not on file  Occupational History   Not on file  Tobacco Use   Smoking status: Never   Smokeless tobacco: Current    Types: Chew   Tobacco comments:    has been using chew 40 years  Vaping Use   Vaping Use: Never used  Substance and Sexual Activity   Alcohol use: Yes    Comment: rare   Drug use: Never   Sexual activity: Yes  Other Topics Concern   Not on file  Social History Narrative   Not on file   Social Determinants of Health   Financial Resource Strain: Not on file  Food Insecurity: Not on file  Transportation Needs: Not on file  Physical Activity: Not on file  Stress: Not on file  Social Connections: Not on file  Intimate Partner Violence: Not on file     Constitutional: Patient reports fatigue.  Denies fever, malaise, fatigue, headache or abrupt weight changes.  HEENT: Denies eye pain, eye redness, ear pain, ringing in the ears, wax buildup, runny nose, nasal congestion, bloody nose, or sore throat. Respiratory: Denies difficulty breathing, shortness of breath, cough or sputum production.   Cardiovascular: Denies chest pain, chest tightness, palpitations or swelling in the hands or feet.  Gastrointestinal: Patient reports intermittent constipation.  Denies abdominal pain, bloating, diarrhea or blood in the stool.  GU: Patient reports urinary hesitancy.  Denies urgency, frequency, pain with urination, burning sensation, blood in urine, odor or discharge. Musculoskeletal: Patient reports joint pain.  Denies decrease in range of motion, difficulty with gait, muscle pain or joint swelling.  Skin: Denies redness, rashes, lesions or ulcercations.  Neurological: Denies dizziness, difficulty with memory, difficulty with speech or problems with balance and coordination.  Psych: Patient has a history of depression.  Denies  anxiety, SI/HI.  No other specific complaints in a complete review of systems (except as listed in HPI above).  Objective:   Physical Exam  BP 132/76 (BP Location: Right Arm, Patient Position: Sitting, Cuff Size: Large)   Pulse 76   Temp (!) 96 F (35.6 C) (Temporal)   Ht 6' (1.829 m)   Wt (!) 348 lb (157.9 kg)   BMI 47.20 kg/m   Wt Readings from Last 3 Encounters:  07/05/22 (!) 345 lb (156.5 kg)  04/15/22 (!) 346 lb (156.9 kg)  02/05/22 (!) 345 lb (156.5 kg)    General: Appears his stated age, obese, in NAD. Skin: Warm, dry and  intact. No  ulcerations noted. HEENT: Head: normal shape and size; Eyes: sclera white, no icterus, conjunctiva pink, PERRLA and EOMs intact;  Neck:  Neck supple, trachea midline. No masses, lumps or thyromegaly present.  Cardiovascular: Normal rate and rhythm. S1,S2 noted.  No murmur, rubs or gallops noted. No JVD or BLE edema. No carotid bruits noted. Pulmonary/Chest: Normal effort and positive vesicular breath sounds. No respiratory distress. No wheezes, rales or ronchi noted.  Abdomen: Soft and nontender. Normal bowel sounds.  Musculoskeletal: Strength 5/5 BUE/BLE.  No difficulty with gait.  Neurological: Alert and oriented. Cranial nerves II-XII grossly intact. Coordination normal.  Psychiatric: Mood and affect normal. Behavior is normal. Judgment and thought content normal.   BMET    Component Value Date/Time   NA 134 (L) 12/09/2021 0832   K 3.9 12/09/2021 0832   CL 98 12/09/2021 0832   CO2 26 12/09/2021 0832   GLUCOSE 252 (H) 12/09/2021 0832   BUN 12 12/09/2021 0832   CREATININE 1.08 12/09/2021 0832   CALCIUM 9.2 12/09/2021 0832   GFRNONAA 69 08/05/2020 0955   GFRAA 79 08/05/2020 0955    Lipid Panel     Component Value Date/Time   CHOL 91 12/09/2021 0832   TRIG 195 (H) 12/09/2021 0832   HDL 29 (L) 12/09/2021 0832   CHOLHDL 3.1 12/09/2021 0832   VLDL 28.0 01/23/2020 1413   LDLCALC 35 12/09/2021 0832    CBC    Component Value  Date/Time   WBC 5.5 04/15/2021 1024   WBC 5.6 03/23/2021 1014   RBC 5.07 04/15/2021 1024   RBC 5.20 03/23/2021 1014   HGB 13.7 04/15/2021 1024   HCT 40.3 04/15/2021 1024   PLT 229 04/15/2021 1024   MCV 80 04/15/2021 1024   MCH 27.0 04/15/2021 1024   MCH 26.5 (L) 03/23/2021 1014   MCHC 34.0 04/15/2021 1024   MCHC 32.2 03/23/2021 1014   RDW 13.1 04/15/2021 1024   LYMPHSABS 1.3 04/15/2021 1024   EOSABS 0.1 04/15/2021 1024   BASOSABS 0.1 04/15/2021 1024    Hgb A1C Lab Results  Component Value Date   HGBA1C 9.7 (A) 04/15/2022           Assessment & Plan:   Preventative Health Maintenance:  Encouraged him to get a flu shot in the fall Tetanus UTD Encouraged to get his COVID booster Pneumovax and Prevnar UTD Shingrix UTD Colon screening declined Encouraged him to consume a balanced diet and exercise regimen Advised him seeing eye doctor and dentist annually We will check ABC, c-Met, lipid, A1c, urine microalbumin and PSA today  RTC in 3 months, follow-up chronic conditions Nicki Reaper, NP

## 2022-08-04 NOTE — Addendum Note (Signed)
Addended by: Paschal Dopp on: 08/04/2022 08:42 AM   Modules accepted: Orders

## 2022-08-04 NOTE — Assessment & Plan Note (Signed)
Encourage diet and exercise for weight loss 

## 2022-08-05 DIAGNOSIS — Z207 Contact with and (suspected) exposure to pediculosis, acariasis and other infestations: Secondary | ICD-10-CM | POA: Diagnosis not present

## 2022-08-05 LAB — CBC
MCHC: 32.4 g/dL (ref 32.0–36.0)
Platelets: 205 10*3/uL (ref 140–400)
RBC: 5.55 10*6/uL (ref 4.20–5.80)
RDW: 14.8 % (ref 11.0–15.0)

## 2022-08-05 LAB — COMPLETE METABOLIC PANEL WITH GFR
AG Ratio: 1.7 (calc) (ref 1.0–2.5)
ALT: 12 U/L (ref 9–46)
AST: 16 U/L (ref 10–35)
Albumin: 4.2 g/dL (ref 3.6–5.1)
Alkaline phosphatase (APISO): 83 U/L (ref 35–144)
BUN: 13 mg/dL (ref 7–25)
CO2: 26 mmol/L (ref 20–32)
Calcium: 8.9 mg/dL (ref 8.6–10.3)
Chloride: 97 mmol/L — ABNORMAL LOW (ref 98–110)
Creat: 1.15 mg/dL (ref 0.70–1.30)
Globulin: 2.5 g/dL (calc) (ref 1.9–3.7)
Glucose, Bld: 197 mg/dL — ABNORMAL HIGH (ref 65–99)
Potassium: 3.9 mmol/L (ref 3.5–5.3)
Sodium: 132 mmol/L — ABNORMAL LOW (ref 135–146)
Total Bilirubin: 0.9 mg/dL (ref 0.2–1.2)
Total Protein: 6.7 g/dL (ref 6.1–8.1)
eGFR: 74 mL/min/{1.73_m2} (ref >=60)

## 2022-08-05 LAB — MICROALBUMIN / CREATININE URINE RATIO
Creatinine, Urine: 71 mg/dL (ref 20–320)
Microalb Creat Ratio: 35 mg/g creat — ABNORMAL HIGH (ref <30)
Microalb, Ur: 2.5 mg/dL

## 2022-08-05 LAB — LIPID PANEL
Cholesterol: 79 mg/dL (ref <200)
HDL: 25 mg/dL — ABNORMAL LOW (ref >=40)
LDL Cholesterol (Calc): 21 mg/dL (calc)
Non-HDL Cholesterol (Calc): 54 mg/dL (calc) (ref <130)
Total CHOL/HDL Ratio: 3.2 (calc) (ref <5.0)
Triglycerides: 284 mg/dL — ABNORMAL HIGH (ref <150)

## 2022-08-05 LAB — HEMOGLOBIN A1C
Hgb A1c MFr Bld: 8.6 % of total Hgb — ABNORMAL HIGH (ref <5.7)
Mean Plasma Glucose: 200 mg/dL
eAG (mmol/L): 11.1 mmol/L

## 2022-08-05 LAB — PSA: PSA: 0.57 ng/mL (ref <=4.00)

## 2022-08-06 LAB — OVA AND PARASITE EXAMINATION
MICRO NUMBER:: 15050930
SPECIMEN QUALITY:: ADEQUATE

## 2022-08-17 ENCOUNTER — Other Ambulatory Visit: Payer: Self-pay | Admitting: Internal Medicine

## 2022-08-17 NOTE — Telephone Encounter (Signed)
Requested Prescriptions  Pending Prescriptions Disp Refills   lamoTRIgine (LAMICTAL) 100 MG tablet [Pharmacy Med Name: LAMOTRIGINE 100MG  TABLET] 90 tablet 0    Sig: TAKE ONE TABLET BY MOUTH EVERY MORNING     Neurology:  Anticonvulsants - lamotrigine Passed - 08/17/2022 12:46 PM      Passed - Cr in normal range and within 360 days    Creat  Date Value Ref Range Status  08/04/2022 1.15 0.70 - 1.30 mg/dL Final   Creatinine, Urine  Date Value Ref Range Status  08/04/2022 71 20 - 320 mg/dL Final         Passed - ALT in normal range and within 360 days    ALT  Date Value Ref Range Status  08/04/2022 12 9 - 46 U/L Final         Passed - AST in normal range and within 360 days    AST  Date Value Ref Range Status  08/04/2022 16 10 - 35 U/L Final         Passed - Completed PHQ-2 or PHQ-9 in the last 360 days      Passed - Valid encounter within last 12 months    Recent Outpatient Visits           1 week ago Encounter for general adult medical examination with abnormal findings   [place] [facility] [place], Salvadore Oxford, NP   1 month ago Hearing loss due to cerumen impaction, left   [place] [facility] [place], [place] W, NP   4 months ago Type 2 diabetes mellitus without complication, without long-term current use of insulin [facility])   [place] [facility] [place], [place] W, NP   6 months ago Right lower quadrant abdominal pain   [place] [facility], Salvadore Oxford, NP   8 months ago Need for influenza vaccination    [facility] [place], Salvadore Oxford, NP       Future Appointments             In 2 months Baity, Salvadore Oxford, NP  [facility]

## 2022-08-19 ENCOUNTER — Other Ambulatory Visit: Payer: Self-pay | Admitting: Internal Medicine

## 2022-08-19 DIAGNOSIS — M2550 Pain in unspecified joint: Secondary | ICD-10-CM

## 2022-08-19 DIAGNOSIS — M791 Myalgia, unspecified site: Secondary | ICD-10-CM

## 2022-08-19 NOTE — Telephone Encounter (Signed)
Requested medication (s) are due for refill today: Yes  Requested medication (s) are on the active medication list: Yes  Last refill:  05/26/22  Future visit scheduled: Yes  Notes to clinic:  Manual review.    Requested Prescriptions  Pending Prescriptions Disp Refills   celecoxib (CELEBREX) 100 MG capsule [Pharmacy Med Name: CELECOXIB 100MG  CAPSULE] 180 capsule 0    Sig: TAKE ONE CAPSULE BY MOUTH TWICE DAILY     Analgesics:  COX2 Inhibitors Failed - 08/19/2022  2:57 PM      Failed - Manual Review: Labs are only required if the patient has taken medication for more than 8 weeks.      Passed - HGB in normal range and within 360 days    Hemoglobin  Date Value Ref Range Status  08/04/2022 14.6 13.2 - 17.1 g/dL Final  10/96/0454 09.8 13.0 - 17.7 g/dL Final         Passed - Cr in normal range and within 360 days    Creat  Date Value Ref Range Status  08/04/2022 1.15 0.70 - 1.30 mg/dL Final   Creatinine, Urine  Date Value Ref Range Status  08/04/2022 71 20 - 320 mg/dL Final         Passed - HCT in normal range and within 360 days    HCT  Date Value Ref Range Status  08/04/2022 45.0 38.5 - 50.0 % Final   Hematocrit  Date Value Ref Range Status  04/15/2021 40.3 37.5 - 51.0 % Final         Passed - AST in normal range and within 360 days    AST  Date Value Ref Range Status  08/04/2022 16 10 - 35 U/L Final         Passed - ALT in normal range and within 360 days    ALT  Date Value Ref Range Status  08/04/2022 12 9 - 46 U/L Final         Passed - eGFR is 30 or above and within 360 days    GFR, Est African American  Date Value Ref Range Status  08/05/2020 79 > OR = 60 mL/min/1.53m2 Final   GFR, Est Non African American  Date Value Ref Range Status  08/05/2020 69 > OR = 60 mL/min/1.6m2 Final   GFR  Date Value Ref Range Status  01/23/2020 67.67 >60.00 mL/min Final    Comment:    Calculated using the CKD-EPI Creatinine Equation (2021)   eGFR  Date Value Ref  Range Status  08/04/2022 74 > OR = 60 mL/min/1.21m2 Final         Passed - Patient is not pregnant      Passed - Valid encounter within last 12 months    Recent Outpatient Visits           2 weeks ago Encounter for general adult medical examination with abnormal findings   [facility] [place], Salvadore Oxford, NP   1 month ago Hearing loss due to cerumen impaction, left   [place] [facility] [place], [place] W, NP   4 months ago Type 2 diabetes mellitus without complication, without long-term current use of insulin [facility])    [place] [facility] [place], [place] W, NP   6 months ago Right lower quadrant abdominal pain   [facility] [place], Salvadore Oxford, NP   8 months ago Need for influenza vaccination   [facility] [place], Salvadore Oxford, NP       Future Appointments             In 2 months Baity, Salvadore Oxford, NP [place] [facility], [facility]

## 2022-08-30 ENCOUNTER — Other Ambulatory Visit: Payer: Self-pay | Admitting: Cardiovascular Disease

## 2022-08-30 ENCOUNTER — Other Ambulatory Visit: Payer: Self-pay | Admitting: Internal Medicine

## 2022-08-30 DIAGNOSIS — I10 Essential (primary) hypertension: Secondary | ICD-10-CM

## 2022-08-30 NOTE — Telephone Encounter (Signed)
Pt is scheduled on 9/3 with [facility]

## 2022-08-30 NOTE — Telephone Encounter (Signed)
Please schedule F/U appointment for further 90 day refills. Thank you!

## 2022-08-31 NOTE — Telephone Encounter (Signed)
Last RF 07/01/22 #90 should have enough until 10/01/22  Requested Prescriptions  Refused Prescriptions Disp Refills   JANUVIA 100 MG tablet [Pharmacy Med Name: JANUVIA 100MG  TABLET] 90 tablet 0    Sig: TAKE ONE TABLET BY MOUTH ONCE A DAY     Endocrinology:  Diabetes - DPP-4 Inhibitors Failed - 08/30/2022  9:06 AM      Failed - HBA1C is between 0 and 7.9 and within 180 days    HbA1c POC (<> result, manual entry)  Date Value Ref Range Status  12/09/2021 10.3 4.0 - 5.6 % Final   Hgb A1c MFr Bld  Date Value Ref Range Status  08/04/2022 8.6 (H) <5.7 % of total Hgb Final    Comment:    For someone without known diabetes, a hemoglobin A1c value of 6.5% or greater indicates that they may have  diabetes and this should be confirmed with a follow-up  test. . For someone with known diabetes, a value <7% indicates  that their diabetes is well controlled and a value  greater than or equal to 7% indicates suboptimal  control. A1c targets should be individualized based on  duration of diabetes, age, comorbid conditions, and  other considerations. . Currently, no consensus exists regarding use of hemoglobin A1c for diagnosis of diabetes for children. .          Passed - Cr in normal range and within 360 days    Creat  Date Value Ref Range Status  08/04/2022 1.15 0.70 - 1.30 mg/dL Final   Creatinine, Urine  Date Value Ref Range Status  08/04/2022 71 20 - 320 mg/dL Final         Passed - Valid encounter within last 6 months    Recent Outpatient Visits           3 weeks ago Encounter for general adult medical examination with abnormal findings   [place] [facility] [place], Salvadore Oxford, NP   1 month ago Hearing loss due to cerumen impaction, left   [place] [facility] [place], [place] W, NP   4 months ago Type 2 diabetes mellitus without complication, without long-term current use of insulin [facility])   [place] [facility] [place], [place],  NP   6 months ago Right lower quadrant abdominal pain   [place] [facility] [place], Salvadore Oxford, NP   8 months ago Need for influenza vaccination   [place] [facility] [place], Salvadore Oxford, NP       Future Appointments             In 2 months Gollan, Tollie Pizza, MD [facility] [place]   In 2 months Baity, Salvadore Oxford, NP  [facility], [facility]

## 2022-09-13 ENCOUNTER — Other Ambulatory Visit: Payer: Self-pay | Admitting: Internal Medicine

## 2022-09-13 ENCOUNTER — Other Ambulatory Visit: Payer: Self-pay | Admitting: Cardiovascular Disease

## 2022-09-14 NOTE — Telephone Encounter (Signed)
Requested Prescriptions  Pending Prescriptions Disp Refills   pantoprazole (PROTONIX) 40 MG tablet [Pharmacy Med Name: PANTOPRAZOLE SODIUM 40MG  TABLET DR] 90 tablet 0    Sig: TAKE ONE TABLET BY MOUTH ONCE A DAY     Gastroenterology: Proton Pump Inhibitors Passed - 09/13/2022  9:34 AM      Passed - Valid encounter within last 12 months    Recent Outpatient Visits           1 month ago Encounter for general adult medical examination with abnormal findings   [place] [facility] [place], [place], NP   2 months ago Hearing loss due to cerumen impaction, left   [place] [facility] [place], [place] W, NP   5 months ago Type 2 diabetes mellitus without complication, without long-term current use of insulin [facility])   [place] [facility] [place], [place] W, NP   7 months ago Right lower quadrant abdominal pain   [place] [facility] [place], Salvadore Oxford, NP   9 months ago Need for influenza vaccination   [place] [facility] [place], Salvadore Oxford, NP       Future Appointments             In 1 month Gollan, Tollie Pizza, MD [facility] [place]   In 1 month Baity, Salvadore Oxford, NP  [facility], [facility]

## 2022-09-20 ENCOUNTER — Other Ambulatory Visit: Payer: Self-pay | Admitting: Internal Medicine

## 2022-09-21 NOTE — Telephone Encounter (Signed)
Requested Prescriptions  Pending Prescriptions Disp Refills   glipiZIDE (GLUCOTROL) 10 MG tablet [Pharmacy Med Name: GLIPIZIDE 10MG  TABLET] 180 tablet 0    Sig: TAKE ONE TABLET BY MOUTH TWICE A DAY BEFORE A MEAL.     Endocrinology:  Diabetes - Sulfonylureas Failed - 09/20/2022 11:13 AM      Failed - HBA1C is between 0 and 7.9 and within 180 days    HbA1c POC (<> result, manual entry)  Date Value Ref Range Status  12/09/2021 10.3 4.0 - 5.6 % Final   Hgb A1c MFr Bld  Date Value Ref Range Status  08/04/2022 8.6 (H) <5.7 % of total Hgb Final    Comment:    For someone without known diabetes, a hemoglobin A1c value of 6.5% or greater indicates that they may have  diabetes and this should be confirmed with a follow-up  test. . For someone with known diabetes, a value <7% indicates  that their diabetes is well controlled and a value  greater than or equal to 7% indicates suboptimal  control. A1c targets should be individualized based on  duration of diabetes, age, comorbid conditions, and  other considerations. . Currently, no consensus exists regarding use of hemoglobin A1c for diagnosis of diabetes for children. .          Passed - Cr in normal range and within 360 days    Creat  Date Value Ref Range Status  08/04/2022 1.15 0.70 - 1.30 mg/dL Final   Creatinine, Urine  Date Value Ref Range Status  08/04/2022 71 20 - 320 mg/dL Final         Passed - Valid encounter within last 6 months    Recent Outpatient Visits           1 month ago Encounter for general adult medical examination with abnormal findings   [facility] [place], [place] W, NP   2 months ago Hearing loss due to cerumen impaction, left   [place] [facility] [place], [place] W, NP   5 months ago Type 2 diabetes mellitus without complication, without long-term current use of insulin [facility])   [place] [facility] [place], [place] W, NP   7 months ago  Right lower quadrant abdominal pain   [place] [place], Salvadore Oxford, NP   9 months ago Need for influenza vaccination   [place] [facility] [place], Salvadore Oxford, NP       Future Appointments             In 1 month Gollan, Tollie Pizza, MD [facility] [place]   In 1 month Baity, Salvadore Oxford, NP [place] [facility], [facility]

## 2022-09-28 ENCOUNTER — Other Ambulatory Visit: Payer: Self-pay | Admitting: Internal Medicine

## 2022-09-29 NOTE — Telephone Encounter (Signed)
Requested Prescriptions  Pending Prescriptions Disp Refills   FLUoxetine (PROZAC) 40 MG capsule [Pharmacy Med Name: FLUOXETINE HYDROCHLORIDE 40MG  CAPSULE] 90 capsule 0    Sig: TAKE ONE CAPSULE BY MOUTH ONCE DAILY     Psychiatry:  Antidepressants - SSRI Passed - 09/28/2022  1:26 PM      Passed - Valid encounter within last 6 months    Recent Outpatient Visits           1 month ago Encounter for general adult medical examination with abnormal findings   [place] [facility] [place], [place] W, NP   2 months ago Hearing loss due to cerumen impaction, left   [facility] [place], [place] W, NP   5 months ago Type 2 diabetes mellitus without complication, without long-term current use of insulin [facility])   [place] [facility] [place], [place] W, NP   7 months ago Right lower quadrant abdominal pain   [place] [place], Salvadore Oxford, NP   9 months ago Need for influenza vaccination   [place] [facility] [place], Salvadore Oxford, NP       Future Appointments             In 1 month Gollan, Tollie Pizza, MD [facility] [place]   In 1 month Baity, Salvadore Oxford, NP [place] [facility]             tamsulosin (FLOMAX) 0.4 MG CAPS capsule [Pharmacy Med Name: TAMSULOSIN HYDROCHLORIDE 0.4MG  CAPSULE] 90 capsule 0    Sig: TAKE ONE CAPSULE BY MOUTH ONCE DAILY     Urology: Alpha-Adrenergic Blocker Passed - 09/28/2022  1:26 PM      Passed - PSA in normal range and within 360 days    PSA  Date Value Ref Range Status  08/04/2022 0.57 < OR = 4.00 ng/mL Final    Comment:    The total PSA value from this assay system is  standardized against the WHO standard. The test  result will be approximately 20% lower when compared  to the equimolar-standardized total PSA (Beckman  Coulter). Comparison of serial PSA results should be  interpreted with this fact in mind. . This test  was performed using the Siemens  chemiluminescent method. Values obtained from  different assay methods cannot be used interchangeably. PSA levels, regardless of value, should not be interpreted as absolute evidence of the presence or absence of disease.          Passed - Last BP in normal range    BP Readings from Last 1 Encounters:  08/04/22 132/76         Passed - Valid encounter within last 12 months    Recent Outpatient Visits           1 month ago Encounter for general adult medical examination with abnormal findings   [place] [facility], Salvadore Oxford, NP   2 months ago Hearing loss due to cerumen impaction, left   [facility] [place], [place], NP   5 months ago Type 2 diabetes mellitus without complication, without long-term current use of insulin [facility])   [facility] [place], Salvadore Oxford, NP   7 months ago Right lower quadrant abdominal pain   [place] [facility], Salvadore Oxford, NP   9 months ago Need for influenza  vaccination   [facility] [place], Salvadore Oxford, NP       Future Appointments             In 1 month Gollan, Tollie Pizza, MD [facility] [place]   In 1 month [place], Salvadore Oxford, NP [facility]             JANUVIA 100 MG tablet [place] Med Name: JANUVIA 100MG  TABLET] 90 tablet 0    Sig: TAKE ONE TABLET BY MOUTH ONCE A DAY     Endocrinology:  Diabetes - DPP-4 Inhibitors Failed - 09/28/2022  1:26 PM      Failed - HBA1C is between 0 and 7.9 and within 180 days    HbA1c POC (<> result, manual entry)  Date Value Ref Range Status  12/09/2021 10.3 4.0 - 5.6 % Final   Hgb A1c MFr Bld  Date Value Ref Range Status  08/04/2022 8.6 (H) <5.7 % of total Hgb Final    Comment:    For someone without known diabetes, a hemoglobin A1c value of 6.5% or greater indicates that they may have  diabetes and  this should be confirmed with a follow-up  test. . For someone with known diabetes, a value <7% indicates  that their diabetes is well controlled and a value  greater than or equal to 7% indicates suboptimal  control. A1c targets should be individualized based on  duration of diabetes, age, comorbid conditions, and  other considerations. . Currently, no consensus exists regarding use of hemoglobin A1c for diagnosis of diabetes for children. .          Passed - Cr in normal range and within 360 days    Creat  Date Value Ref Range Status  08/04/2022 1.15 0.70 - 1.30 mg/dL Final   Creatinine, Urine  Date Value Ref Range Status  08/04/2022 71 20 - 320 mg/dL Final         Passed - Valid encounter within last 6 months    Recent Outpatient Visits           1 month ago Encounter for general adult medical examination with abnormal findings   [place] [facility] [place], [place], NP   2 months ago Hearing loss due to cerumen impaction, left   [facility] [place], [place], NP   5 months ago Type 2 diabetes mellitus without complication, without long-term current use of insulin [facility])   [facility] [place], [place], NP   7 months ago Right lower quadrant abdominal pain   [place] [facility] [place], Salvadore Oxford, NP   9 months ago Need for influenza vaccination   [place] [place], Salvadore Oxford, NP       Future Appointments             In 1 month Gollan, Tollie Pizza, MD [facility] [place]   In 1 month Baity, Salvadore Oxford, NP  [facility], [facility]

## 2022-09-30 ENCOUNTER — Ambulatory Visit: Payer: 59 | Attending: Cardiovascular Disease | Admitting: Cardiovascular Disease

## 2022-09-30 ENCOUNTER — Encounter: Payer: Self-pay | Admitting: Cardiovascular Disease

## 2022-09-30 VITALS — BP 130/72 | HR 117 | Ht 72.0 in | Wt 360.4 lb

## 2022-09-30 DIAGNOSIS — I4819 Other persistent atrial fibrillation: Secondary | ICD-10-CM

## 2022-09-30 DIAGNOSIS — R0602 Shortness of breath: Secondary | ICD-10-CM

## 2022-09-30 DIAGNOSIS — I10 Essential (primary) hypertension: Secondary | ICD-10-CM | POA: Diagnosis not present

## 2022-09-30 DIAGNOSIS — R600 Localized edema: Secondary | ICD-10-CM | POA: Diagnosis not present

## 2022-09-30 MED ORDER — FUROSEMIDE 20 MG PO TABS: 20.0000 mg | ORAL_TABLET | Freq: Every day | ORAL | 3 refills | Status: DC

## 2022-09-30 MED ORDER — APIXABAN 5 MG PO TABS: 5.0000 mg | ORAL_TABLET | Freq: Two times a day (BID) | ORAL | 3 refills | Status: DC

## 2022-09-30 MED ORDER — METOPROLOL SUCCINATE ER 50 MG PO TB24: 50.0000 mg | ORAL_TABLET | Freq: Every day | ORAL | 3 refills | Status: DC

## 2022-09-30 MED ORDER — DILTIAZEM HCL ER COATED BEADS 180 MG PO CP24: 180.0000 mg | ORAL_CAPSULE | Freq: Every day | ORAL | 3 refills | Status: DC

## 2022-09-30 NOTE — Progress Notes (Signed)
Cardiology Office Note  Date:  09/30/2022   ID:  Anthony Castillo, DOB Oct 07, 1965, MRN [id number]  PCP:  Lorre Munroe, NP   Chief Complaint  Patient presents with   12 month follow up     Patient c/o bilateral LE edema & shortness of breath with any amount of activity. Medications reviewed by the patient verbally.     HPI:  Anthony Castillo is a 57 year old gentleman with history of Morbid obesity Nonsmoker, dips Chronic shortness of breath Hyperlipidemia Hypertension CT coronary calcium scoring discussed, score 103 Sleep apnea, currently not on CPAP Osteoarthritis Who presents for follow-up of his chest pain, shortness of breath, leg swelling  Last seen by myself in clinic July 2023 BMI 49  In follow-up today he reports having worsening shortness of breath since the beginning of the year Has appreciated more cough, lower extremity edema Denies chest pain More fatigue  EKG showing new atrial fibrillation, rate 117 bpm  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 rate has increased BY  39 BPM Nonspecific T wave abnormality, improved in Lateral leads Confirmed by Julien Nordmann [phone]) on 09/30/2022 5:26:23 PM    PMH:   has a past medical history of Chronic pain (04/09/2011), DEPRESSION (03/16/2007), GERD (gastroesophageal reflux disease), HYPERTENSION (09/22/2006), Hypogonadism male (04/09/2011), Morbid obesity (HCC) (09/22/2006),  Osteoarthritis (04/09/2011), Sleep apnea, and Wears glasses.  PSH:    Past Surgical History:  Procedure Laterality Date   CARPAL TUNNEL RELEASE Right 05/17/2013   Procedure: RIGHT CARPAL TUNNEL RELEASE;  Surgeon: Wyn Forster., MD;  Location: [facility];  Service: Orthopedics;  Laterality: Right;   CARPAL TUNNEL RELEASE Left 07/19/2013   Procedure: LEFT CARPAL TUNNEL RELEASE;  Surgeon: Wyn Forster., MD;  Location: [facility];  Service: Orthopedics;  Laterality: Left;   FOOT SURGERY Right 01/2015   KNEE ARTHROSCOPY  6/14   right   TONSILLECTOMY     ULNAR NERVE TRANSPOSITION Right 05/17/2013   Procedure: RIGHT ULNAR NERVE DECOMPRESSION;  Surgeon: Wyn Forster., MD;  Location: [facility];  Service: Orthopedics;  Laterality: Right;   WISDOM TOOTH EXTRACTION      Current Outpatient Medications  Medication Sig Dispense Refill   Acetaminophen (TYLENOL ARTHRITIS PAIN PO) Take by mouth as needed.     amLODipine (NORVASC) 10 MG tablet TAKE ONE TABLET BY MOUTH ONCE A DAY 90 tablet 0   augmented betamethasone dipropionate (DIPROLENE-AF) 0.05 % ointment Apply topically.     buPROPion (WELLBUTRIN SR) 150 MG 12 hr tablet Take 1 tablet (150 mg total) by mouth daily. 90 tablet 1   celecoxib (CELEBREX) 100 MG capsule TAKE ONE CAPSULE BY MOUTH TWICE DAILY 180 capsule 0   cetirizine (ZYRTEC) 10 MG tablet Take 10 mg by mouth daily.     Cholecalciferol (VITAMIN D3) 1000 units CAPS Take 1 capsule by mouth daily.     Continuous Glucose Receiver (DEXCOM G7 RECEIVER) DEVI Use  to check blood sugar as needed for i type 2 diabetes DX: E11.9 1 each 0   Continuous Glucose Sensor (DEXCOM G7 SENSOR) MISC Apply 1 sensor Td every 10 days 9 each 0   ezetimibe (ZETIA) 10 MG tablet Take 1 tablet (10 mg total) by mouth daily. Please call the office to schedule yearly follow up prior to future refills 90 tablet 0   fluocinonide cream (LIDEX) 0.05 % as needed.   1    FLUoxetine (PROZAC) 40 MG capsule TAKE ONE CAPSULE BY MOUTH ONCE DAILY 90 capsule 0   glipiZIDE (GLUCOTROL) 10 MG tablet TAKE ONE TABLET BY MOUTH TWICE A DAY BEFORE A MEAL. 180 tablet 0   indomethacin (INDOCIN) 50 MG capsule Take 1 capsule (50 mg total) by mouth 2 (two) times daily with a meal. 60 capsule 1   JANUVIA 100 MG tablet TAKE ONE TABLET BY MOUTH ONCE A DAY 90 tablet 0   lamoTRIgine (LAMICTAL) 100 MG tablet TAKE ONE TABLET BY MOUTH EVERY MORNING 90 tablet 0   lisinopril-hydrochlorothiazide (ZESTORETIC) 20-12.5 MG tablet Take 2 tablets by mouth daily. Keep scheduled appointment for further refills. 180 tablet 0   nystatin-triamcinolone ointment (MYCOLOG) Apply 1 Application topically 2 (two) times daily. 30 g 0   pantoprazole (PROTONIX) 40 MG tablet TAKE ONE TABLET BY MOUTH ONCE A DAY 90 tablet 0   potassium chloride (KLOR-CON) 10 MEQ tablet TAKE ONE TABLET BY MOUTH ONCE A DAY 90 tablet 1   rosuvastatin (CRESTOR) 10 MG tablet Take 1 tablet (10 mg total) by mouth daily. PLEASE KEEP SCHEDULED VISIT FOR FURTHER REFILLS. THANK YOU! 90 tablet 0   tamsulosin (FLOMAX) 0.4 MG CAPS capsule TAKE ONE CAPSULE BY MOUTH ONCE DAILY 90 capsule 0   tizanidine (ZANAFLEX) 6 MG capsule Take 1 capsule (6 mg total) by mouth 3 (three) times daily as needed for muscle spasms. 20 capsule 0   No current facility-administered medications for this visit.    Allergies:   Penicillins   Social History:  The patient  reports that he has never smoked. His smokeless tobacco use includes chew. He reports current alcohol use. He reports that he does not use drugs.   Family History:   family history includes Arthritis in his paternal grandmother; Diabetes in his maternal grandmother and sister; Heart Problems in his son; Heart disease in his father and paternal grandfather; Hypertension in his father; Irritable bowel syndrome in his daughter; Lymphoma in his mother; Migraines in his daughter; Stroke in his paternal uncle.     Review of Systems: Review of Systems  Constitutional: Negative.   HENT: Negative.    Respiratory:  Positive for shortness of breath.   Cardiovascular:  Positive for leg swelling.  Gastrointestinal: Negative.   Musculoskeletal: Negative.   Neurological: Negative.   Psychiatric/Behavioral: Negative.    All other systems reviewed and are negative.   PHYSICAL EXAM: VS:  BP 130/72 (BP Location: Left Arm, Patient Position: Sitting, Cuff Size: Large)   Pulse (!) 117   Ht 6' (1.829 m)   Wt (!) 360 lb 6 oz (163.5 kg)   SpO2 97%   BMI 48.88 kg/m  , BMI Body mass index is 48.88 kg/m. Constitutional:  oriented to person, place, and time. No distress.  HENT:  Head: Grossly normal Eyes:  no discharge. No scleral icterus.  Neck: No JVD, no carotid bruits  Cardiovascular: Irregularly irregular, no murmurs appreciated Trace lower extremity edema Pulmonary/Chest: Clear to auscultation bilaterally, no wheezes or rails Abdominal: Soft.  no distension.  no tenderness.  Musculoskeletal: Normal range of motion Neurological:  normal muscle tone. Coordination normal. No atrophy Skin: Skin warm and dry Psychiatric: normal affect, pleasant   Recent Labs: 08/04/2022: ALT 12; BUN 13; Creat 1.15; Hemoglobin 14.6; Platelets 205; Potassium 3.9; Sodium 132    Lipid Panel Lab Results  Component Value Date   CHOL 79 08/04/2022   HDL 25 (L) 08/04/2022   LDLCALC 21 08/04/2022   TRIG 284 (H) 08/04/2022      Wt Readings from Last 3 Encounters:  09/30/22 (!) 360 lb 6 oz (163.5 kg)  08/04/22 (!) 348 lb (157.9 kg)  07/05/22 (!) 345 lb (156.5 kg)      ASSESSMENT AND PLAN:  New onset atrial fibrillation with RVR Timing of onset unclear, Worsening show once of breath leg swelling cough over the past 6 months Last EKG July 2023 normal sinus rhythm Rate elevated today, medication changes as below we will add metoprolol succinate 50 daily Start diltiazem ER 180 daily Hold amlodipine, stop  lisinopril HCTZ, stop aspirin Start Eliquis 5 twice daily Start Lasix 20 daily, continue potassium 10 daily Started discussion concerning options to restore normal sinus rhythm This would likely be complicated secondary to poorly controlled sleep apnea  Obstructive sleep apnea Recommended he reconnect with ENT, check [place] for further discussion concerning management of his sleep apnea Possible trigger for his atrial fibrillation  Type 2 diabetes poor control A1c  8.6 Low carbohydrate diet recommended  Coronary calcification Low calcium score of 100   Lower extremity edema Trace edema on today's visit Possibly exacerbated by atrial fibrillation with RVR, fluid retention  shortness of breath -  Deconditioning, morbid obesity likely contributing Symptoms exacerbated by atrial fibrillation with RVR  Hypertension Medication changes as above   Total encounter time more than 40 minutes  Greater than 50% was spent in counseling and coordination of care with the patient   Orders Placed This Encounter  Procedures   EKG 12-Lead    Signed, Dossie Arbour, M.D., Ph.D. 09/30/2022  [facility] [place], [place] [phone]

## 2022-09-30 NOTE — Patient Instructions (Addendum)
Medication Instructions:  Please start diltiazem ER 180 mg daily Start metoprolol succinate 50 mg daily  Please start eliquis 5 mg twice a day (coupon)  Please start lasix 20 mg daily with potassium 10 meq daily  Stop aspirin Stop amlodipine Stop lisinopril-hydrochlorothiazide  Monitor blood pressure and heart rate at home.    If you need a refill on your cardiac medications before your next appointment, please call your pharmacy.   Lab work: No new labs needed  Testing/Procedures:  Your physician has requested that you have an echocardiogram. Echocardiography is a painless test that uses sound waves to create images of your heart. It provides your doctor with information about the size and shape of your heart and how well your heart's chambers and valves are working.   You may receive an ultrasound enhancing agent through an IV if needed to better visualize your heart during the echo. This procedure takes approximately one hour.  There are no restrictions for this procedure.  This will take place at [address] [address] [facility], [place] [address]   Follow-Up: At [facility], you and your health needs are our priority.  As part of our continuing mission to provide you with exceptional heart care, we have created designated Provider Care Teams.  These Care Teams include your primary Cardiologist (physician) and Advanced Practice Providers (APPs -  Physician Assistants and Nurse Practitioners) who all work together to provide you with the care you need, when you need it.  You will need a follow up appointment in 1 months  Providers on your designated Care Team:   Nicolasa Ducking, NP Eula Listen, PA-C Cadence Fransico [place], [place]  COVID-19 Vaccine Information can be found at: [email or web address] For questions related to vaccine distribution or appointments, please email [email or web address] or call  [phone].

## 2022-10-18 ENCOUNTER — Encounter: Payer: Self-pay | Admitting: Internal Medicine

## 2022-10-18 ENCOUNTER — Ambulatory Visit (INDEPENDENT_AMBULATORY_CARE_PROVIDER_SITE_OTHER): Payer: 59 | Admitting: Internal Medicine

## 2022-10-18 VITALS — BP 132/68 | HR 53 | Temp 96.4°F | Wt 360.0 lb

## 2022-10-18 DIAGNOSIS — F329 Major depressive disorder, single episode, unspecified: Secondary | ICD-10-CM | POA: Diagnosis not present

## 2022-10-18 DIAGNOSIS — G4701 Insomnia due to medical condition: Secondary | ICD-10-CM | POA: Diagnosis not present

## 2022-10-18 DIAGNOSIS — Z6841 Body Mass Index (BMI) 40.0 and over, adult: Secondary | ICD-10-CM

## 2022-10-18 DIAGNOSIS — F32A Depression, unspecified: Secondary | ICD-10-CM | POA: Insufficient documentation

## 2022-10-18 DIAGNOSIS — G4733 Obstructive sleep apnea (adult) (pediatric): Secondary | ICD-10-CM | POA: Diagnosis not present

## 2022-10-18 DIAGNOSIS — F331 Major depressive disorder, recurrent, moderate: Secondary | ICD-10-CM | POA: Insufficient documentation

## 2022-10-18 MED ORDER — BUPROPION HCL ER (SR) 150 MG PO TB12: 150.0000 mg | ORAL_TABLET | Freq: Two times a day (BID) | ORAL | Status: DC

## 2022-10-18 MED ORDER — ZOLPIDEM TARTRATE ER 6.25 MG PO TBCR: 6.2500 mg | EXTENDED_RELEASE_TABLET | Freq: Every evening | ORAL | 0 refills | Status: DC | PRN

## 2022-10-18 NOTE — Progress Notes (Signed)
Subjective:    Patient ID: Anthony Castillo, male    DOB: [birth year], 57 y.o.   MRN: [id number]  HPI  Patient presents to clinic today for follow-up of insomnia.  He is able to fall asleep but he is unable to stay asleep.  He is not currently taking any medications for this. He has a history of sleep apnea but is unable to tolerate his CPAP.  He has chronic fatigue.  If he sits down for any extended period of time he will fall asleep.  Sleep study from 04/2003 reviewed.  Review of Systems     Past Medical History:  Diagnosis Date   Chronic pain 04/09/2011   DEPRESSION 03/16/2007   GERD (gastroesophageal reflux disease)    HYPERTENSION 09/22/2006   Hypogonadism male 04/09/2011   Morbid obesity (HCC) 09/22/2006   Osteoarthritis 04/09/2011   Sleep apnea    uses a cpap-will use after surgery   Wears glasses     Current Outpatient Medications  Medication Sig Dispense Refill   Acetaminophen (TYLENOL ARTHRITIS PAIN PO) Take by mouth as needed.     apixaban (ELIQUIS) 5 MG TABS tablet Take 1 tablet (5 mg total) by mouth 2 (two) times daily. 180 tablet 3   augmented betamethasone dipropionate (DIPROLENE-AF) 0.05 % ointment Apply topically.     buPROPion (WELLBUTRIN SR) 150 MG 12 hr tablet Take 1 tablet (150 mg total) by mouth daily. 90 tablet 1   celecoxib (CELEBREX) 100 MG capsule TAKE ONE CAPSULE BY MOUTH TWICE DAILY 180 capsule 0   cetirizine (ZYRTEC) 10 MG tablet Take 10 mg by mouth daily.     Cholecalciferol (VITAMIN D3) 1000 units CAPS Take 1 capsule by mouth daily.     Continuous Glucose Receiver (DEXCOM G7 RECEIVER) DEVI Use to check blood sugar as needed for i type 2 diabetes DX: E11.9 1 each 0   Continuous Glucose Sensor (DEXCOM G7 SENSOR) MISC Apply 1 sensor Td every 10 days 9 each 0   diltiazem (CARDIZEM CD) 180 MG 24 hr capsule Take 1 capsule (180 mg total) by mouth daily. 90 capsule 3   ezetimibe (ZETIA) 10 MG tablet Take 1 tablet (10 mg total) by mouth daily. Please call the office  to schedule yearly follow up prior to future refills 90 tablet 0   fluocinonide cream (LIDEX) 0.05 % as needed.   1   FLUoxetine (PROZAC) 40 MG capsule TAKE ONE CAPSULE BY MOUTH ONCE DAILY 90 capsule 0   furosemide (LASIX) 20 MG tablet Take 1 tablet (20 mg total) by mouth daily. 90 tablet 3   glipiZIDE (GLUCOTROL) 10 MG tablet TAKE ONE TABLET BY MOUTH TWICE A DAY BEFORE A MEAL. 180 tablet 0   indomethacin (INDOCIN) 50 MG capsule Take 1 capsule (50 mg total) by mouth 2 (two) times daily with a meal. 60 capsule 1   JANUVIA 100 MG tablet TAKE ONE TABLET BY MOUTH ONCE A DAY 90 tablet 0   lamoTRIgine (LAMICTAL) 100 MG tablet TAKE ONE TABLET BY MOUTH EVERY MORNING 90 tablet 0   metoprolol succinate (TOPROL-XL) 50 MG 24 hr tablet Take 1 tablet (50 mg total) by mouth daily. Take with or immediately following a meal. 90 tablet 3   nystatin-triamcinolone ointment (MYCOLOG) Apply 1 Application topically 2 (two) times daily. 30 g 0   pantoprazole (PROTONIX) 40 MG tablet TAKE ONE TABLET BY MOUTH ONCE A DAY 90 tablet 0   potassium chloride (KLOR-CON) 10 MEQ tablet TAKE ONE TABLET BY MOUTH  ONCE A DAY 90 tablet 1   rosuvastatin (CRESTOR) 10 MG tablet Take 1 tablet (10 mg total) by mouth daily. PLEASE KEEP SCHEDULED VISIT FOR FURTHER REFILLS. THANK YOU! 90 tablet 0   tamsulosin (FLOMAX) 0.4 MG CAPS capsule TAKE ONE CAPSULE BY MOUTH ONCE DAILY 90 capsule 0   tizanidine (ZANAFLEX) 6 MG capsule Take 1 capsule (6 mg total) by mouth 3 (three) times daily as needed for muscle spasms. 20 capsule 0   No current facility-administered medications for this visit.    Allergies  Allergen Reactions   Penicillins     REACTION: unspecified    Family History  Problem Relation Age of Onset   Lymphoma Mother    Heart disease Father    Hypertension Father    Diabetes Sister    Diabetes Maternal Grandmother    Arthritis Paternal Grandmother    Heart disease Paternal Grandfather    Migraines Daughter    Irritable bowel  syndrome Daughter    Heart Problems Son        valve    Stroke Paternal Uncle    Colon cancer Neg Hx    Prostate cancer Neg Hx     Social History   Socioeconomic History   Marital status: Married    Spouse name: Not on file   Number of children: Not on file   Years of education: Not on file   Highest education level: Not on file  Occupational History   Not on file  Tobacco Use   Smoking status: Never   Smokeless tobacco: Current    Types: Chew   Tobacco comments:    has been using chew 40 years  Vaping Use   Vaping status: Never Used  Substance and Sexual Activity   Alcohol use: Yes    Comment: rare   Drug use: Never   Sexual activity: Yes  Other Topics Concern   Not on file  Social History Narrative   Not on file   Social Determinants of Health   Financial Resource Strain: Not on file  Food Insecurity: Not on file  Transportation Needs: Not on file  Physical Activity: Not on file  Stress: Not on file  Social Connections: Not on file  Intimate Partner Violence: Not on file     Constitutional: Patient reports chronic fatigue.  Denies fever, malaise, headache or abrupt weight changes.  HEENT: Denies eye pain, eye redness, ear pain, ringing in the ears, wax buildup, runny nose, nasal congestion, bloody nose, or sore throat. Respiratory: Denies difficulty breathing, shortness of breath, cough or sputum production.   Cardiovascular: Denies chest pain, chest tightness, palpitations or swelling in the hands or feet.  Gastrointestinal: Denies abdominal pain, bloating, constipation, diarrhea or blood in the stool.  GU: Pt reports urinary hesitancy. Denies urgency, frequency, pain with urination, burning sensation, blood in urine, odor or discharge. Musculoskeletal: Patient reports joint pain.  Denies decrease in range of motion, difficulty with gait, muscle pain or joint swelling.  Skin: Denies redness, rashes, lesions or ulcercations.  Neurological: Pt reports  insomnia. Denies dizziness, difficulty with memory, difficulty with speech or problems with balance and coordination.  Psych: Patient has a history of depression.  Denies anxiety, SI/HI.  No other specific complaints in a complete review of systems (except as listed in HPI above).  Objective:   Physical Exam  BP 132/68 (BP Location: Right Arm, Patient Position: Sitting, Cuff Size: Large)   Pulse (!) 53   Temp (!) 96.4 F (35.8 C) (  Temporal)   Wt (!) 360 lb (163.3 kg)   SpO2 96%   BMI 48.82 kg/m   Wt Readings from Last 3 Encounters:  09/30/22 (!) 360 lb 6 oz (163.5 kg)  08/04/22 (!) 348 lb (157.9 kg)  07/05/22 (!) 345 lb (156.5 kg)    General: Appears his stated age, obese in NAD. Cardiovascular: Bradycardic with normal rhythm. S1,S2 noted.  No murmur, rubs or gallops noted.  Pulmonary/Chest: Normal effort and positive vesicular breath sounds. No respiratory distress. No wheezes, rales or ronchi noted.  Neurological: Alert and oriented. Coordination normal.  Psychiatric: Mood and affect normal.  Mildly anxious appearing. Judgment and thought content normal.    BMET    Component Value Date/Time   NA 132 (L) 08/04/2022 1149   K 3.9 08/04/2022 1149   CL 97 (L) 08/04/2022 1149   CO2 26 08/04/2022 1149   GLUCOSE 197 (H) 08/04/2022 1149   BUN 13 08/04/2022 1149   CREATININE 1.15 08/04/2022 1149   CALCIUM 8.9 08/04/2022 1149   GFRNONAA 69 08/05/2020 0955   GFRAA 79 08/05/2020 0955    Lipid Panel     Component Value Date/Time   CHOL 79 08/04/2022 1149   TRIG 284 (H) 08/04/2022 1149   HDL 25 (L) 08/04/2022 1149   CHOLHDL 3.2 08/04/2022 1149   VLDL 28.0 01/23/2020 1413   LDLCALC 21 08/04/2022 1149    CBC    Component Value Date/Time   WBC 6.0 08/04/2022 1149   RBC 5.55 08/04/2022 1149   HGB 14.6 08/04/2022 1149   HGB 13.7 04/15/2021 1024   HCT 45.0 08/04/2022 1149   HCT 40.3 04/15/2021 1024   PLT 205 08/04/2022 1149   PLT 229 04/15/2021 1024   MCV 81.1  08/04/2022 1149   MCV 80 04/15/2021 1024   MCH 26.3 (L) 08/04/2022 1149   MCHC 32.4 08/04/2022 1149   RDW 14.8 08/04/2022 1149   RDW 13.1 04/15/2021 1024   LYMPHSABS 1.3 04/15/2021 1024   EOSABS 0.1 04/15/2021 1024   BASOSABS 0.1 04/15/2021 1024    Hgb A1C Lab Results  Component Value Date   HGBA1C 8.6 (H) 08/04/2022           Assessment & Plan:   RTC in 1 month for follow-up of chronic conditions Nicki Reaper, NP

## 2022-10-18 NOTE — Assessment & Plan Note (Signed)
Will try Ambien 6.25 mg CR daily at bedtime

## 2022-10-18 NOTE — Assessment & Plan Note (Signed)
He is unable to tolerate CPAP Discussed dental device versus implantable device however he would like to hold off at this time Encourage weight loss as this can reduce sleep apnea symptoms

## 2022-10-18 NOTE — Assessment & Plan Note (Signed)
Encourage diet and exercise for weight loss 

## 2022-10-18 NOTE — Assessment & Plan Note (Signed)
Will increase bupropion to 150 mg XL daily Continue fluoxetine and lamotrigine

## 2022-10-18 NOTE — Patient Instructions (Signed)
Insomnia Insomnia is a sleep disorder that makes it difficult to fall asleep or stay asleep. Insomnia can cause fatigue, low energy, difficulty concentrating, mood swings, and poor performance at work or school. There are three different ways to classify insomnia: Difficulty falling asleep. Difficulty staying asleep. Waking up too early in the morning. Any type of insomnia can be long-term (chronic) or short-term (acute). Both are common. Short-term insomnia usually lasts for 3 months or less. Chronic insomnia occurs at least three times a week for longer than 3 months. What are the causes? Insomnia may be caused by another condition, situation, or substance, such as: Having certain mental health conditions, such as anxiety and depression. Using caffeine, alcohol, tobacco, or drugs. Having gastrointestinal conditions, such as gastroesophageal reflux disease (GERD). Having certain medical conditions. These include: Asthma. Alzheimer's disease. Stroke. Chronic pain. An overactive thyroid gland (hyperthyroidism). Other sleep disorders, such as restless legs syndrome and sleep apnea. Menopause. Sometimes, the cause of insomnia may not be known. What increases the risk? Risk factors for insomnia include: Gender. Females are affected more often than males. Age. Insomnia is more common as people get older. Stress and certain medical and mental health conditions. Lack of exercise. Having an irregular work schedule. This may include working night shifts and traveling between different time zones. What are the signs or symptoms? If you have insomnia, the main symptom is having trouble falling asleep or having trouble staying asleep. This may lead to other symptoms, such as: Feeling tired or having low energy. Feeling nervous about going to sleep. Not feeling rested in the morning. Having trouble concentrating. Feeling irritable, anxious, or depressed. How is this diagnosed? This condition  may be diagnosed based on: Your symptoms and medical history. Your health care provider may ask about: Your sleep habits. Any medical conditions you have. Your mental health. A physical exam. How is this treated? Treatment for insomnia depends on the cause. Treatment may focus on treating an underlying condition that is causing the insomnia. Treatment may also include: Medicines to help you sleep. Counseling or therapy. Lifestyle adjustments to help you sleep better. Follow these instructions at home: Eating and drinking  Limit or avoid alcohol, caffeinated beverages, and products that contain nicotine and tobacco, especially close to bedtime. These can disrupt your sleep. Do not eat a large meal or eat spicy foods right before bedtime. This can lead to digestive discomfort that can make it hard for you to sleep. Sleep habits  Keep a sleep diary to help you and your health care provider figure out what could be causing your insomnia. Write down: When you sleep. When you wake up during the night. How well you sleep and how rested you feel the next day. Any side effects of medicines you are taking. What you eat and drink. Make your bedroom a dark, comfortable place where it is easy to fall asleep. Put up shades or blackout curtains to block light from outside. Use a white noise machine to block noise. Keep the temperature cool. Limit screen use before bedtime. This includes: Not watching TV. Not using your smartphone, tablet, or computer. Stick to a routine that includes going to bed and waking up at the same times every day and night. This can help you fall asleep faster. Consider making a quiet activity, such as reading, part of your nighttime routine. Try to avoid taking naps during the day so that you sleep better at night. Get out of bed if you are still awake after   15 minutes of trying to sleep. Keep the lights down, but try reading or doing a quiet activity. When you feel  sleepy, go back to bed. General instructions Take over-the-counter and prescription medicines only as told by your health care provider. Exercise regularly as told by your health care provider. However, avoid exercising in the hours right before bedtime. Use relaxation techniques to manage stress. Ask your health care provider to suggest some techniques that may work well for you. These may include: Breathing exercises. Routines to release muscle tension. Visualizing peaceful scenes. Make sure that you drive carefully. Do not drive if you feel very sleepy. Keep all follow-up visits. This is important. Contact a health care provider if: You are tired throughout the day. You have trouble in your daily routine due to sleepiness. You continue to have sleep problems, or your sleep problems get worse. Get help right away if: You have thoughts about hurting yourself or someone else. Get help right away if you feel like you may hurt yourself or others, or have thoughts about taking your own life. Go to your nearest emergency room or: Call 911. Call the National Suicide Prevention Lifeline at 1-800-273-8255 or 988. This is open 24 hours a day. Text the Crisis Text Line at 741741. Summary Insomnia is a sleep disorder that makes it difficult to fall asleep or stay asleep. Insomnia can be long-term (chronic) or short-term (acute). Treatment for insomnia depends on the cause. Treatment may focus on treating an underlying condition that is causing the insomnia. Keep a sleep diary to help you and your health care provider figure out what could be causing your insomnia. This information is not intended to replace advice given to you by your health care provider. Make sure you discuss any questions you have with your health care provider. Document Revised: 01/26/2021 Document Reviewed: 01/26/2021 Elsevier Patient Education  2024 [employer]  

## 2022-10-21 ENCOUNTER — Ambulatory Visit: Payer: 59 | Admitting: Internal Medicine

## 2022-10-25 ENCOUNTER — Other Ambulatory Visit: Payer: Self-pay | Admitting: Internal Medicine

## 2022-10-26 NOTE — Telephone Encounter (Signed)
Requested medication (s) are due for refill today -no  Requested medication (s) are on the active medication list -yes  Future visit scheduled -yes  Last refill: 10/18/22  Notes to clinic: Please review - Original Rx marked as print (may need to resend) and requested Rx directions are different- please correct directions and number  Requested Prescriptions  Pending Prescriptions Disp Refills   buPROPion (WELLBUTRIN SR) 150 MG 12 hr tablet [Pharmacy Med Name: BUPROPION HYDROCHLORIDE ER (SR) 150MG  SR TABLET ER 12HR] 90 tablet 1    Sig: TAKE ONE TABLET BY MOUTH ONCE A DAY     Psychiatry: Antidepressants - bupropion Passed - 10/25/2022  9:07 AM      Passed - Cr in normal range and within 360 days    Creat  Date Value Ref Range Status  08/04/2022 1.15 0.70 - 1.30 mg/dL Final   Creatinine, Urine  Date Value Ref Range Status  08/04/2022 71 20 - 320 mg/dL Final         Passed - AST in normal range and within 360 days    AST  Date Value Ref Range Status  08/04/2022 16 10 - 35 U/L Final         Passed - ALT in normal range and within 360 days    ALT  Date Value Ref Range Status  08/04/2022 12 9 - 46 U/L Final         Passed - Completed PHQ-2 or PHQ-9 in the last 360 days      Passed - Last BP in normal range    BP Readings from Last 1 Encounters:  10/18/22 132/68         Passed - Valid encounter within last 6 months    Recent Outpatient Visits           1 week ago Reactive depression   [place] [facility] [place], Salvadore Oxford, NP   2 months ago Encounter for general adult medical examination with abnormal findings   [place] [facility] [place], Salvadore Oxford, NP   3 months ago Hearing loss due to cerumen impaction, left   [place] [facility] [place], [place] W, NP   6 months ago Type 2 diabetes mellitus without complication, without long-term current use of insulin [facility])   [place] [facility] [place], [place]  W, NP   8 months ago Right lower quadrant abdominal pain   [place] [facility] [place], Salvadore Oxford, NP       Future Appointments             In 1 week Sampson Si, Salvadore Oxford, NP [place] [facility]   In 1 month Mariah Milling, Tollie Pizza, MD  [facility]               Requested Prescriptions  Pending Prescriptions Disp Refills   buPROPion (WELLBUTRIN SR) 150 MG 12 hr tablet [Pharmacy Med Name: BUPROPION HYDROCHLORIDE ER (SR) 150MG  SR TABLET ER 12HR] 90 tablet 1    Sig: TAKE ONE TABLET BY MOUTH ONCE A DAY     Psychiatry: Antidepressants - bupropion Passed - 10/25/2022  9:07 AM      Passed - Cr in normal range and within 360 days    Creat  Date Value Ref Range Status  08/04/2022 1.15 0.70 - 1.30 mg/dL Final   Creatinine, Urine  Date Value Ref Range Status  08/04/2022 71 20 -  320 mg/dL Final         Passed - AST in normal range and within 360 days    AST  Date Value Ref Range Status  08/04/2022 16 10 - 35 U/L Final         Passed - ALT in normal range and within 360 days    ALT  Date Value Ref Range Status  08/04/2022 12 9 - 46 U/L Final         Passed - Completed PHQ-2 or PHQ-9 in the last 360 days      Passed - Last BP in normal range    BP Readings from Last 1 Encounters:  10/18/22 132/68         Passed - Valid encounter within last 6 months    Recent Outpatient Visits           1 week ago Reactive depression   [place] [facility] [place], Salvadore Oxford, NP   2 months ago Encounter for general adult medical examination with abnormal findings   [place] [facility] [place], Salvadore Oxford, NP   3 months ago Hearing loss due to cerumen impaction, left   [place] [facility] [place], [place] W, NP   6 months ago Type 2 diabetes mellitus without complication, without long-term current use of insulin [facility])   [place] [facility] [place], [place] W, NP    8 months ago Right lower quadrant abdominal pain   [place] [facility] [place], Salvadore Oxford, NP       Future Appointments             In 1 week Sampson Si, Salvadore Oxford, NP [place] [facility], [facility]   In 1 month G[place], Tollie Pizza, MD [facility]

## 2022-11-02 ENCOUNTER — Ambulatory Visit: Payer: Self-pay | Admitting: Cardiovascular Disease

## 2022-11-03 ENCOUNTER — Other Ambulatory Visit: Payer: Self-pay | Admitting: Cardiovascular Disease

## 2022-11-03 ENCOUNTER — Ambulatory Visit: Payer: 59 | Attending: Cardiovascular Disease

## 2022-11-03 DIAGNOSIS — R600 Localized edema: Secondary | ICD-10-CM

## 2022-11-03 DIAGNOSIS — I4819 Other persistent atrial fibrillation: Secondary | ICD-10-CM | POA: Diagnosis not present

## 2022-11-03 DIAGNOSIS — R0602 Shortness of breath: Secondary | ICD-10-CM

## 2022-11-03 DIAGNOSIS — E782 Mixed hyperlipidemia: Secondary | ICD-10-CM

## 2022-11-03 DIAGNOSIS — Z8249 Family history of ischemic heart disease and other diseases of the circulatory system: Secondary | ICD-10-CM

## 2022-11-03 LAB — ECHOCARDIOGRAM COMPLETE: S' Lateral: 3.7 cm

## 2022-11-03 MED ORDER — PERFLUTREN LIPID MICROSPHERE
1.0000 mL | INTRAVENOUS | Status: AC | PRN
Start: 2022-11-03 — End: 2022-11-03
  Administered 2022-11-03: 3 mL via INTRAVENOUS

## 2022-11-04 ENCOUNTER — Ambulatory Visit
Admission: RE | Admit: 2022-11-04 | Discharge: 2022-11-04 | Disposition: A | Discharge status: Home / Self Care | Payer: 59 | Source: Ambulatory Visit | Attending: Internal Medicine | Admitting: Internal Medicine

## 2022-11-04 ENCOUNTER — Ambulatory Visit (INDEPENDENT_AMBULATORY_CARE_PROVIDER_SITE_OTHER): Payer: 59 | Admitting: Internal Medicine

## 2022-11-04 ENCOUNTER — Encounter: Payer: Self-pay | Admitting: Internal Medicine

## 2022-11-04 ENCOUNTER — Ambulatory Visit
Admission: RE | Admit: 2022-11-04 | Discharge: 2022-11-04 | Disposition: A | Discharge status: Home / Self Care | Payer: 59 | Attending: Internal Medicine | Admitting: Internal Medicine

## 2022-11-04 VITALS — BP 154/84 | HR 86 | Temp 96.0°F | Wt 359.0 lb

## 2022-11-04 DIAGNOSIS — G4733 Obstructive sleep apnea (adult) (pediatric): Secondary | ICD-10-CM

## 2022-11-04 DIAGNOSIS — F329 Major depressive disorder, single episode, unspecified: Secondary | ICD-10-CM | POA: Diagnosis not present

## 2022-11-04 DIAGNOSIS — R3911 Hesitancy of micturition: Secondary | ICD-10-CM

## 2022-11-04 DIAGNOSIS — Z23 Encounter for immunization: Secondary | ICD-10-CM

## 2022-11-04 DIAGNOSIS — I10 Essential (primary) hypertension: Secondary | ICD-10-CM | POA: Diagnosis not present

## 2022-11-04 DIAGNOSIS — G4701 Insomnia due to medical condition: Secondary | ICD-10-CM | POA: Diagnosis not present

## 2022-11-04 DIAGNOSIS — M159 Polyosteoarthritis, unspecified: Secondary | ICD-10-CM

## 2022-11-04 DIAGNOSIS — R0602 Shortness of breath: Secondary | ICD-10-CM

## 2022-11-04 DIAGNOSIS — N401 Enlarged prostate with lower urinary tract symptoms: Secondary | ICD-10-CM | POA: Diagnosis not present

## 2022-11-04 DIAGNOSIS — E291 Testicular hypofunction: Secondary | ICD-10-CM

## 2022-11-04 DIAGNOSIS — E11621 Type 2 diabetes mellitus with foot ulcer: Secondary | ICD-10-CM

## 2022-11-04 DIAGNOSIS — L97509 Non-pressure chronic ulcer of other part of unspecified foot with unspecified severity: Secondary | ICD-10-CM

## 2022-11-04 DIAGNOSIS — I4891 Unspecified atrial fibrillation: Secondary | ICD-10-CM | POA: Insufficient documentation

## 2022-11-04 DIAGNOSIS — I4811 Longstanding persistent atrial fibrillation: Secondary | ICD-10-CM

## 2022-11-04 DIAGNOSIS — E7800 Pure hypercholesterolemia, unspecified: Secondary | ICD-10-CM

## 2022-11-04 DIAGNOSIS — K219 Gastro-esophageal reflux disease without esophagitis: Secondary | ICD-10-CM | POA: Diagnosis not present

## 2022-11-04 DIAGNOSIS — Z6841 Body Mass Index (BMI) 40.0 and over, adult: Secondary | ICD-10-CM

## 2022-11-04 DIAGNOSIS — J439 Emphysema, unspecified: Secondary | ICD-10-CM | POA: Diagnosis not present

## 2022-11-04 LAB — POCT GLYCOSYLATED HEMOGLOBIN (HGB A1C): Hemoglobin A1C: 10 % — AB (ref 4.0–5.6)

## 2022-11-04 MED ORDER — LISINOPRIL 10 MG PO TABS: 10.0000 mg | ORAL_TABLET | Freq: Every day | ORAL | 1 refills | Status: DC

## 2022-11-04 MED ORDER — TIRZEPATIDE 2.5 MG/0.5ML ~~LOC~~ SOAJ: 2.5000 mg | SUBCUTANEOUS | 0 refills | Status: DC

## 2022-11-04 NOTE — Assessment & Plan Note (Signed)
Continue lamotrigine, fluoxetine and bupropion Support offered

## 2022-11-04 NOTE — Assessment & Plan Note (Signed)
POCT A1c 10% Urine microalbumin has been checked within the last year Encourage low-carb diet and exercise for weight loss Continue glipizide and Januvia We will add Mounjaro 2.5 mg weekly with plan to titrate up to 5 mg weekly thereafter Encouraged routine eye exam Encouraged routine foot exam Flu shot today

## 2022-11-04 NOTE — Assessment & Plan Note (Signed)
-   Continue Flomax 

## 2022-11-04 NOTE — Assessment & Plan Note (Signed)
Not currently taking testosterone

## 2022-11-04 NOTE — Assessment & Plan Note (Signed)
Encouraged diet and exercise for weight loss We will trial [facility]

## 2022-11-04 NOTE — Progress Notes (Signed)
Subjective:    Patient ID: Anthony Castillo, male    DOB: October 27, 1965, 57 y.o.   MRN: [id number]  HPI  Patient presents to clinic today for 39-month follow-up of chronic conditions.  Depression: Chronic dysthymia, worse lately.  He is taking lamotrigine and fluoxetine as prescribed.  Bupropion was increased to twice daily a few weeks ago.  He is unsure if this has helped his mood or not.  He is not currently seeing a therapist.  He denies anxiety, SI/HI.  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  He is taking [place] and glipizide as prescribed.  He is taking gabapentin as well.  He does not check his sugars.  He does not check his feet routinely.  His last eye exam was 06/2022.  Flu 11/2021.  Pneumovax 03/2021.  Prevnar 03/2017.  COVID x 2.  HTN: His BP today is 148/82.  He is taking diltiazem, metoprolol and furosemide as prescribed.  ECG from 09/2022 reviewed.  A-fib: Managed with diltiazem, metoprolol and eliquis.  ECG from 09/2022 reviewed.  He follows with cardiology.  BPH: He reports mainly urinary urgency, frequency and nocturia.  He is taking flomax as prescribed.  He does not follow with urology.  GERD: He is not sure what triggers this.  He denies breakthrough on pantoprazole.  There is no upper GI on file.  Hypogonadism: He is not currently taking testosterone.  He no longer follows with urology.  OA: Generalized.  He is taking celebrex, zanaflex and gabapentin with some relief of symptoms.  He is unsure if he is taking indomethacin.  He takes tylenol arthritis as needed for breakthrough.  He does not follow with orthopedics.  HLD: His last LDL was 21, triglycerides 829, 06/2022.  He denies myalgias on atorvastatin, ezetimibe and fish oil.  He does not consume a low-fat diet.  OSA: He averages less than 5 hours of sleep per night without the use of his CPAP.  There is no sleep study on file.  Insomnia: He has difficulty staying asleep.  He is not taking ambien as  prescribed because he does not like the way it makes him feel.  He reports he has been taking melatonin but this also leaves him drowsy the next day.  There is no sleep study on file.  Review of Systems  Past Medical History:  Diagnosis Date   Chronic pain 04/09/2011   DEPRESSION 03/16/2007   GERD (gastroesophageal reflux disease)    HYPERTENSION 09/22/2006   Hypogonadism male 04/09/2011   Morbid obesity (HCC) 09/22/2006   Osteoarthritis 04/09/2011   Sleep apnea    uses a cpap-will use after surgery   Wears glasses     Current Outpatient Medications  Medication Sig Dispense Refill   Acetaminophen (TYLENOL ARTHRITIS PAIN PO) Take by mouth as needed.     apixaban (ELIQUIS) 5 MG TABS tablet Take 1 tablet (5 mg total) by mouth 2 (two) times daily. 180 tablet 3   augmented betamethasone dipropionate (DIPROLENE-AF) 0.05 % ointment Apply topically.     buPROPion (WELLBUTRIN SR) 150 MG 12 hr tablet Take 1 tablet (150 mg total) by mouth 2 (two) times daily.     celecoxib (CELEBREX) 100 MG capsule TAKE ONE CAPSULE BY MOUTH TWICE DAILY 180 capsule 0   cetirizine (ZYRTEC) 10 MG tablet Take 10 mg by mouth daily.     Cholecalciferol (VITAMIN D3) 1000 units CAPS Take 1 capsule by mouth daily.     Continuous Glucose Receiver (  DEXCOM G7 RECEIVER) DEVI Use to check blood sugar as needed for i type 2 diabetes DX: E11.9 1 each 0   Continuous Glucose Sensor (DEXCOM G7 SENSOR) MISC Apply 1 sensor Td every 10 days 9 each 0   diltiazem (CARDIZEM CD) 180 MG 24 hr capsule Take 1 capsule (180 mg total) by mouth daily. 90 capsule 3   ezetimibe (ZETIA) 10 MG tablet Take 1 tablet (10 mg total) by mouth daily. 90 tablet 0   fluocinonide cream (LIDEX) 0.05 % as needed.   1   FLUoxetine (PROZAC) 40 MG capsule TAKE ONE CAPSULE BY MOUTH ONCE DAILY 90 capsule 0   furosemide (LASIX) 20 MG tablet Take 1 tablet (20 mg total) by mouth daily. 90 tablet 3   glipiZIDE (GLUCOTROL) 10 MG tablet TAKE ONE TABLET BY MOUTH TWICE A DAY  BEFORE A MEAL. 180 tablet 0   indomethacin (INDOCIN) 50 MG capsule Take 1 capsule (50 mg total) by mouth 2 (two) times daily with a meal. 60 capsule 1   JANUVIA 100 MG tablet TAKE ONE TABLET BY MOUTH ONCE A DAY 90 tablet 0   lamoTRIgine (LAMICTAL) 100 MG tablet TAKE ONE TABLET BY MOUTH EVERY MORNING 90 tablet 0   metoprolol succinate (TOPROL-XL) 50 MG 24 hr tablet Take 1 tablet (50 mg total) by mouth daily. Take with or immediately following a meal. 90 tablet 3   nystatin-triamcinolone ointment (MYCOLOG) Apply 1 Application topically 2 (two) times daily. 30 g 0   pantoprazole (PROTONIX) 40 MG tablet TAKE ONE TABLET BY MOUTH ONCE A DAY 90 tablet 0   potassium chloride (KLOR-CON) 10 MEQ tablet TAKE ONE TABLET BY MOUTH ONCE A DAY 90 tablet 1   rosuvastatin (CRESTOR) 10 MG tablet Take 1 tablet (10 mg total) by mouth daily. PLEASE KEEP SCHEDULED VISIT FOR FURTHER REFILLS. THANK YOU! 90 tablet 0   tamsulosin (FLOMAX) 0.4 MG CAPS capsule TAKE ONE CAPSULE BY MOUTH ONCE DAILY 90 capsule 0   tizanidine (ZANAFLEX) 6 MG capsule Take 1 capsule (6 mg total) by mouth 3 (three) times daily as needed for muscle spasms. 20 capsule 0   zolpidem (AMBIEN CR) 6.25 MG CR tablet Take 1 tablet (6.25 mg total) by mouth at bedtime as needed for sleep. 30 tablet 0   No current facility-administered medications for this visit.    Allergies  Allergen Reactions   Penicillins     REACTION: unspecified    Family History  Problem Relation Age of Onset   Lymphoma Mother    Heart disease Father    Hypertension Father    Diabetes Sister    Diabetes Maternal Grandmother    Arthritis Paternal Grandmother    Heart disease Paternal Grandfather    Migraines Daughter    Irritable bowel syndrome Daughter    Heart Problems Son        valve    Stroke Paternal Uncle    Colon cancer Neg Hx    Prostate cancer Neg Hx     Social History   Socioeconomic History   Marital status: Married    Spouse name: Not on file   Number  of children: Not on file   Years of education: Not on file   Highest education level: Not on file  Occupational History   Not on file  Tobacco Use   Smoking status: Never   Smokeless tobacco: Current    Types: Chew   Tobacco comments:    has been using chew 40 years  Vaping Use   Vaping status: Never Used  Substance and Sexual Activity   Alcohol use: Yes    Comment: rare   Drug use: Never   Sexual activity: Yes  Other Topics Concern   Not on file  Social History Narrative   Not on file   Social Determinants of Health   Financial Resource Strain: Not on file  Food Insecurity: Not on file  Transportation Needs: Not on file  Physical Activity: Not on file  Stress: Not on file  Social Connections: Not on file  Intimate Partner Violence: Not on file     Constitutional: Patient reports fatigue.  Denies fever, malaise, headache or abrupt weight changes.  HEENT: Denies eye pain, eye redness, ear pain, ringing in the ears, wax buildup, runny nose, nasal congestion, bloody nose, or sore throat. Respiratory: Patient reports shortness of breath.  Denies difficulty breathing, cough or sputum production.   Cardiovascular: Patient reports swelling in legs.  Denies chest pain, chest tightness, palpitations or swelling in the hands.  Gastrointestinal: Denies abdominal pain, bloating, constipation, diarrhea or blood in the stool.  GU: Patient reports urgency, frequency and nocturia.  Denies pain with urination, burning sensation, blood in urine, odor or discharge. Musculoskeletal: Patient reports chronic joint pain.  Denies decrease in range of motion, difficulty with gait, muscle pain or joint swelling.  Skin: Denies redness, rashes, lesions or ulcercations.  Neurological: Patient reports insomnia.  Denies dizziness, difficulty with memory, difficulty with speech or problems with balance and coordination.  Psych: Patient has a history of depression.  Denies anxiety, SI/HI.  No other  specific complaints in a complete review of systems (except as listed in HPI above).     Objective:   Physical Exam  BP (!) 154/84 (BP Location: Left Arm, Patient Position: Sitting, Cuff Size: Large)   Pulse 86   Temp (!) 96 F (35.6 C) (Temporal)   Wt (!) 359 lb (162.8 kg)   SpO2 97%   BMI 48.69 kg/m   Wt Readings from Last 3 Encounters:  10/18/22 (!) 360 lb (163.3 kg)  09/30/22 (!) 360 lb 6 oz (163.5 kg)  08/04/22 (!) 348 lb (157.9 kg)    General: Appears his stated age, obese, in NAD. Skin: Warm, dry and intact. No ulcerations noted. HEENT: Head: normal shape and size; Eyes: sclera white, no icterus, conjunctiva pink, PERRLA and EOMs intact;  Cardiovascular: Normal rate and rhythm. S1,S2 noted.  No murmur, rubs or gallops noted. No JVD.  Trace pitting BLE edema. No carotid bruits noted. Pulmonary/Chest: Normal effort and positive vesicular breath sounds. No respiratory distress. No wheezes, rales or ronchi noted.  Abdomen: Soft and nontender. Normal bowel sounds.  Musculoskeletal: No difficulty with gait.  Neurological: Alert and oriented.  Coordination normal.  Psychiatric: Mood and affect mildly flat. Behavior is normal. Judgment and thought content normal.    BMET    Component Value Date/Time   NA 132 (L) 08/04/2022 1149   K 3.9 08/04/2022 1149   CL 97 (L) 08/04/2022 1149   CO2 26 08/04/2022 1149   GLUCOSE 197 (H) 08/04/2022 1149   BUN 13 08/04/2022 1149   CREATININE 1.15 08/04/2022 1149   CALCIUM 8.9 08/04/2022 1149   GFRNONAA 69 08/05/2020 0955   GFRAA 79 08/05/2020 0955    Lipid Panel     Component Value Date/Time   CHOL 79 08/04/2022 1149   TRIG 284 (H) 08/04/2022 1149   HDL 25 (L) 08/04/2022 1149   CHOLHDL 3.2 08/04/2022 1149  VLDL 28.0 01/23/2020 1413   LDLCALC 21 08/04/2022 1149    CBC    Component Value Date/Time   WBC 6.0 08/04/2022 1149   RBC 5.55 08/04/2022 1149   HGB 14.6 08/04/2022 1149   HGB 13.7 04/15/2021 1024   HCT 45.0  08/04/2022 1149   HCT 40.3 04/15/2021 1024   PLT 205 08/04/2022 1149   PLT 229 04/15/2021 1024   MCV 81.1 08/04/2022 1149   MCV 80 04/15/2021 1024   MCH 26.3 (L) 08/04/2022 1149   MCHC 32.4 08/04/2022 1149   RDW 14.8 08/04/2022 1149   RDW 13.1 04/15/2021 1024   LYMPHSABS 1.3 04/15/2021 1024   EOSABS 0.1 04/15/2021 1024   BASOSABS 0.1 04/15/2021 1024    Hgb A1C Lab Results  Component Value Date   HGBA1C 8.6 (H) 08/04/2022            Assessment & Plan:     RTC in 3 months for follow-up of chronic conditions Nicki Reaper, NP

## 2022-11-04 NOTE — Assessment & Plan Note (Signed)
Encourage weight loss as this can help reduce joint pain Continue Celebrex, Zanaflex and gabapentin If you are taking indomethacin, please stop this

## 2022-11-04 NOTE — Assessment & Plan Note (Signed)
Uncontrolled on diltiazem, metoprolol and furosemide We will add lisinopril 10 mg daily mainly for blood pressure control and renal protection from diabetes Reinforced DASH diet and exercise for weight loss Kidney function reviewed

## 2022-11-04 NOTE — Assessment & Plan Note (Signed)
C-Met and lipid profile reviewed Continue atorvastatin, ezetimibe and fish oil Encouraged low-fat diet

## 2022-11-04 NOTE — Assessment & Plan Note (Signed)
Continue diltiazem, metoprolol and Eliquis per cardiology

## 2022-11-04 NOTE — Assessment & Plan Note (Signed)
Discussed that this could be the biggest factor impacting his health at the moment He is unable to tolerate CPAP machine and does not want the implantable device Referral to pulmonology for further evaluation

## 2022-11-04 NOTE — Patient Instructions (Signed)

## 2022-11-04 NOTE — Assessment & Plan Note (Signed)
Will discontinue Ambien as he did not like the way it made him feel Advised him to try Benadryl 25 mg at bedtime

## 2022-11-04 NOTE — Assessment & Plan Note (Signed)
Encourage weight loss as this can help reduce reflux symptoms Continue pantoprazole 

## 2022-11-09 ENCOUNTER — Encounter: Payer: Self-pay | Admitting: Internal Medicine

## 2022-11-09 ENCOUNTER — Other Ambulatory Visit: Payer: Self-pay | Admitting: Internal Medicine

## 2022-11-10 MED ORDER — OZEMPIC (0.25 OR 0.5 MG/DOSE) 2 MG/3ML ~~LOC~~ SOPN: 0.5000 mg | PEN_INJECTOR | SUBCUTANEOUS | 0 refills | Status: DC

## 2022-11-10 NOTE — Telephone Encounter (Signed)
No confirmation from pharmacy, will resend.  Requested Prescriptions  Pending Prescriptions Disp Refills   buPROPion (WELLBUTRIN SR) 150 MG 12 hr tablet [Pharmacy Med Name: BUPROPION HYDROCHLORIDE ER (SR) 150MG  SR TABLET ER 12HR] 90 tablet 0    Sig: TAKE ONE TABLET BY MOUTH ONCE A DAY     Psychiatry: Antidepressants - bupropion Failed - 11/09/2022  9:05 AM      Failed - Last BP in normal range    BP Readings from Last 1 Encounters:  11/04/22 (!) 154/84         Passed - Cr in normal range and within 360 days    Creat  Date Value Ref Range Status  08/04/2022 1.15 0.70 - 1.30 mg/dL Final   Creatinine, Urine  Date Value Ref Range Status  08/04/2022 71 20 - 320 mg/dL Final         Passed - AST in normal range and within 360 days    AST  Date Value Ref Range Status  08/04/2022 16 10 - 35 U/L Final         Passed - ALT in normal range and within 360 days    ALT  Date Value Ref Range Status  08/04/2022 12 9 - 46 U/L Final         Passed - Completed PHQ-2 or PHQ-9 in the last 360 days      Passed - Valid encounter within last 6 months    Recent Outpatient Visits           6 days ago Type 2 diabetes mellitus with foot ulcer, without long-term current use of insulin [facility])   [place] [facility] [place], Salvadore Oxford, NP   3 weeks ago Reactive depression   [place] [facility] [place], Salvadore Oxford, NP   3 months ago Encounter for general adult medical examination with abnormal findings   [place] [facility] [place], Salvadore Oxford, NP   4 months ago Hearing loss due to cerumen impaction, left   [place] [facility] [place], [place] W, NP   6 months ago Type 2 diabetes mellitus without complication, without long-term current use of insulin [facility])   [place] [facility] [place], Salvadore Oxford, NP       Future Appointments             In 2 weeks Gollan, Tollie Pizza, MD [facility] [place]   In 3  months Baity, Salvadore Oxford, NP [place] [facility]

## 2022-11-11 ENCOUNTER — Encounter (HOSPITAL_BASED_OUTPATIENT_CLINIC_OR_DEPARTMENT_OTHER): Payer: Self-pay | Admitting: Pulmonary Disease

## 2022-11-11 ENCOUNTER — Ambulatory Visit (HOSPITAL_BASED_OUTPATIENT_CLINIC_OR_DEPARTMENT_OTHER): Payer: 59 | Admitting: Pulmonary Disease

## 2022-11-11 VITALS — BP 130/76 | HR 101 | Resp 18 | Ht 72.0 in | Wt 352.0 lb

## 2022-11-11 DIAGNOSIS — G4733 Obstructive sleep apnea (adult) (pediatric): Secondary | ICD-10-CM

## 2022-11-11 DIAGNOSIS — I4811 Longstanding persistent atrial fibrillation: Secondary | ICD-10-CM

## 2022-11-11 MED ORDER — ALBUTEROL SULFATE HFA 108 (90 BASE) MCG/ACT IN AERS: 2.0000 | INHALATION_SPRAY | Freq: Four times a day (QID) | RESPIRATORY_TRACT | 6 refills | Status: DC | PRN

## 2022-11-11 NOTE — Assessment & Plan Note (Addendum)
Close correlation between OSA and atrial fibrillation and maintenance of sinus rhythm with CPAP was discussed

## 2022-11-11 NOTE — Progress Notes (Signed)
Subjective:    Patient ID: Anthony Castillo, male    DOB: June 19, 1965, 57 y.o.   MRN: [id number]  HPI  57 year old obese former presents for evaluation of sleep disordered breathing and shortness of breath. He is accompanied by his wife. He reports dyspnea on exertion ongoing for years.  He was evaluated by cardiology 09/30/2022 with increased lower extremity edema was found to be in new onset atrial fibrillation.  Echo 9//24 showed normal LV function and normal RV function.  He was diuresed with Lasix 20 mg daily.  His weight has decreased from 360 to 352 pounds. OSA was diagnosed many years ago he feels that diagnosis was incorrect since he never slept during the sleep study.  Maintained on CPAP with sporadic compliance but he quit using few years ago. Epworth sleepiness score is 8. Bedtime is between 10 and 11 PM he sleeps on his right side with 1 pillow reports several awakenings between 11 PM and 3 AM and sometimes wakes up and gets into a recliner and is finally awake at 6 AM. He has sleep onset insomnia and has tried Ambien in the past, melatonin seems to linger, Benadryl seems to work the best. Wife reports loud snoring gasping and choking episodes.  Twitching and jerking in his sleep.   Prior CPAP download shows pressure of 15 cm, nasal pillows seem to work the best  There is no history suggestive of cataplexy, sleep paralysis or parasomnias    Significant tests/ events reviewed  04/2003 NPSG >>wt 290 lbs , TST 340 minutes, AHI 10/hour, low saturation 79%  01/2008 PFTs normal spirometry, DLCO 76%. 04/2018 CT coronaries clear lungs, calcium score 103   Past Medical History:  Diagnosis Date   Chronic pain 04/09/2011   DEPRESSION 03/16/2007   GERD (gastroesophageal reflux disease)    HYPERTENSION 09/22/2006   Hypogonadism male 04/09/2011   Morbid obesity (HCC) 09/22/2006   Osteoarthritis 04/09/2011   Sleep apnea    uses a cpap-will use after surgery   Wears glasses    Past  Surgical History:  Procedure Laterality Date   CARPAL TUNNEL RELEASE Right 05/17/2013   Procedure: RIGHT CARPAL TUNNEL RELEASE;  Surgeon: Wyn Forster., MD;  Location: [facility];  Service: Orthopedics;  Laterality: Right;   CARPAL TUNNEL RELEASE Left 07/19/2013   Procedure: LEFT CARPAL TUNNEL RELEASE;  Surgeon: Wyn Forster., MD;  Location: [facility];  Service: Orthopedics;  Laterality: Left;   FOOT SURGERY Right 01/2015   KNEE ARTHROSCOPY  6/14   right   TONSILLECTOMY     ULNAR NERVE TRANSPOSITION Right 05/17/2013   Procedure: RIGHT ULNAR NERVE DECOMPRESSION;  Surgeon: Wyn Forster., MD;  Location: [facility];  Service: Orthopedics;  Laterality: Right;   WISDOM TOOTH EXTRACTION      Allergies  Allergen Reactions   Penicillins     REACTION: unspecified     Social History   Socioeconomic History   Marital status: Married    Spouse name: Not on file   Number of children: Not on file   Years of education: Not on file   Highest education level: Not on file  Occupational History   Not on file  Tobacco Use   Smoking status: Never    Passive exposure: Never   Smokeless tobacco: Current    Types: Chew   Tobacco comments:    has been using chew 40 years  Vaping Use   Vaping status: Never  Used  Substance and Sexual Activity   Alcohol use: Yes    Comment: rare   Drug use: Never   Sexual activity: Yes  Other Topics Concern   Not on file  Social History Narrative   Not on file   Social Determinants of Health   Financial Resource Strain: Not on file  Food Insecurity: Not on file  Transportation Needs: Not on file  Physical Activity: Not on file  Stress: Not on file  Social Connections: Not on file  Intimate Partner Violence: Not on file    Family History  Problem Relation Age of Onset   Lymphoma Mother    Heart disease Father    Hypertension Father    Diabetes Sister    Diabetes Maternal Grandmother     Arthritis Paternal Grandmother    Heart disease Paternal Grandfather    Migraines Daughter    Irritable bowel syndrome Daughter    Heart Problems Son        valve    Stroke Paternal Uncle    Colon cancer Neg Hx    Prostate cancer Neg Hx      Review of Systems Constitutional: negative for anorexia, fevers and sweats  Eyes: negative for irritation, redness and visual disturbance  Ears, nose, mouth, throat, and face: negative for earaches, epistaxis, nasal congestion and sore throat  Respiratory: negative for cough, sputum and wheezing  Cardiovascular: negative for chest pain, lower extremity edema, orthopnea, palpitations and syncope  Gastrointestinal: negative for abdominal pain, constipation, diarrhea, melena, nausea and vomiting  Genitourinary:negative for dysuria, frequency and hematuria  Hematologic/lymphatic: negative for bleeding, easy bruising and lymphadenopathy  Musculoskeletal:negative for arthralgias, muscle weakness and stiff joints  Neurological: negative for coordination problems, gait problems, headaches and weakness  Endocrine: negative for diabetic symptoms including polydipsia, polyuria and weight loss     Objective:   Physical Exam  Gen. Pleasant, obese, in no distress, normal affect ENT - no pallor,icterus, no post nasal drip, class 2-3 airway Neck: No JVD, no thyromegaly, no carotid bruits Lungs: no use of accessory muscles, no dullness to percussion, decreased without rales or rhonchi  Cardiovascular: Rhythm regular, heart sounds  normal, no murmurs or gallops, 1+ peripheral edema Abdomen: soft and non-tender, no hepatosplenomegaly, BS normal. Musculoskeletal: No deformities, no cyanosis or clubbing Neuro:  alert, non focal, no tremors       Assessment & Plan:   Dyspnea is longstanding but more recent worsening could be related to chronic diastolic heart failure related to new onset atrial fibrillation.  He has improved somewhat with diuresis but  likely also needs nocturnal PAP to stabilize his breathing.  Clearly there is an element of deconditioning due to morbid obesity.  Since he is already on anticoagulation and will not investigate for VTE

## 2022-11-11 NOTE — Assessment & Plan Note (Signed)
We will reassess with a home sleep test.  Serum bicarbonate is 26 obesity hypoventilation is less likely.  Ideally he would need a formal attended titration study but he is hesitant to go to the lab so we may have to undertake auto titration.  He prefers nasal mask so we will likely initiate auto CPAP with nasal mask after the study. Weight loss encouraged, compliance with goal of at least 4-6 hrs every night is the expectation. Advised against medications with sedative side effects Cautioned against driving when sleepy - understanding that sleepiness will vary on a day to day basis

## 2022-11-11 NOTE — Patient Instructions (Signed)
X Home sleep test  Based on this, we will initiate autoCPAP with nasal mask  X Rx for albuterol MDI -2 puffs every 6h as needed only

## 2022-11-18 ENCOUNTER — Encounter: Payer: Self-pay | Admitting: Internal Medicine

## 2022-11-22 ENCOUNTER — Ambulatory Visit: Payer: Self-pay | Admitting: *Deleted

## 2022-11-22 NOTE — Telephone Encounter (Signed)
Summary: blood sugar high   Pt is calling back regarding his blood sugar levels running 250-300 all weekend. Pt states that his medication has switched and it has been running wild ever since. Pt states that he has no symptoms.  ----- Message from [place] P sent at 11/22/2022  9:47 AM EDT ----- Pt call saying since he changed blood sugar medication and is using a sensor his blood sugar is saying 250 to 300.  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  Please advise.  CB@  [phone]         Attempted to call patinet- no answer- VM not set up- unable to leave call back message

## 2022-11-22 NOTE — Telephone Encounter (Signed)
Voicemail box not set up , unable to leave message

## 2022-11-22 NOTE — Telephone Encounter (Signed)
Chief Complaint: Elevated blood sugars  Symptoms: None Frequency: constant Pertinent Negatives: Patient denies new symptoms Disposition: [] ED /[] Urgent Care (no appt availability in office) / [x] Appointment(In office/virtual)/ []  [place] [facility]/ [] Home Care/ [] Refused Recommended Disposition /[] [place] [facility]/ []  Follow-up with PCP Additional Notes: Patient states he has had elevated blood sugars in the 200-250 range. Patient reports he was suppose to start ozempic or mounjaro but his insurance denied coverage. Patient currently taking glipizide and Januvia as directed. Patient states he does not think the medication is enough and wants to know what else he can take to get a better control of his blood sugar numbers. Patient requesting appointment with PCP to discuss further. Patient has been scheduled 11/30/22. Care advice given and advised to callback if symptoms change. Patient verbalized understanding.  Reason for Disposition  [1] Blood glucose 240 - 300 mg/dL (16.1 - 09.6 mmol/L) AND [2] uses insulin (e.g., insulin-dependent, all people with type 1 diabetes)  Answer Assessment - Initial Assessment Questions 1. BLOOD GLUCOSE: "What is your blood glucose level?"      Last time I checked it was 250 2. ONSET: "When did you check the blood glucose?"     Ongoing since medication change  3. USUAL RANGE: "What is your glucose level usually?" (e.g., usual fasting morning value, usual evening value)     I'm not sure  4. KETONES: "Do you check for ketones (urine or blood test strips)?" If Yes, ask: "What does the test show now?"      No  5. TYPE 1 or 2:  "Do you know what type of diabetes you have?"  (e.g., Type 1, Type 2, Gestational; doesn't know)      Type 2  6. INSULIN: "Do you take insulin?" "What type of insulin(s) do you use? What is the mode of delivery? (syringe, pen; injection or pump)?"      No 7. DIABETES PILLS: "Do you take any pills for your diabetes?" If Yes, ask:  "Have you missed taking any pills recently?"     Glipizide BID and Januvia once daily 8. OTHER SYMPTOMS: "Do you have any symptoms?" (e.g., fever, frequent urination, difficulty breathing, dizziness, weakness, vomiting)     Frequent urination  Protocols used: Diabetes - High Blood Sugar-A-AH

## 2022-11-23 ENCOUNTER — Other Ambulatory Visit: Payer: Self-pay | Admitting: Internal Medicine

## 2022-11-23 DIAGNOSIS — M791 Myalgia, unspecified site: Secondary | ICD-10-CM

## 2022-11-23 DIAGNOSIS — M2550 Pain in unspecified joint: Secondary | ICD-10-CM

## 2022-11-23 MED ORDER — TRULICITY 0.75 MG/0.5ML ~~LOC~~ SOAJ
0.7500 mg | SUBCUTANEOUS | 0 refills | Status: DC
Start: 2022-11-23 — End: 2023-02-17

## 2022-11-24 ENCOUNTER — Ambulatory Visit: Payer: 59

## 2022-11-24 ENCOUNTER — Encounter: Payer: Self-pay | Admitting: Internal Medicine

## 2022-11-24 NOTE — Telephone Encounter (Signed)
Requested Prescriptions  Pending Prescriptions Disp Refills   celecoxib (CELEBREX) 100 MG capsule [Pharmacy Med Name: CELECOXIB 100MG  CAPSULE] 180 capsule 0    Sig: TAKE ONE CAPSULE BY MOUTH TWICE DAILY     Analgesics:  COX2 Inhibitors Failed - 11/23/2022  2:17 PM      Failed - Manual Review: Labs are only required if the patient has taken medication for more than 8 weeks.      Passed - HGB in normal range and within 360 days    Hemoglobin  Date Value Ref Range Status  08/04/2022 14.6 13.2 - 17.1 g/dL Final  53/66/4403 47.4 13.0 - 17.7 g/dL Final         Passed - Cr in normal range and within 360 days    Creat  Date Value Ref Range Status  08/04/2022 1.15 0.70 - 1.30 mg/dL Final   Creatinine, Urine  Date Value Ref Range Status  08/04/2022 71 20 - 320 mg/dL Final         Passed - HCT in normal range and within 360 days    HCT  Date Value Ref Range Status  08/04/2022 45.0 38.5 - 50.0 % Final   Hematocrit  Date Value Ref Range Status  04/15/2021 40.3 37.5 - 51.0 % Final         Passed - AST in normal range and within 360 days    AST  Date Value Ref Range Status  08/04/2022 16 10 - 35 U/L Final         Passed - ALT in normal range and within 360 days    ALT  Date Value Ref Range Status  08/04/2022 12 9 - 46 U/L Final         Passed - eGFR is 30 or above and within 360 days    GFR, Est African American  Date Value Ref Range Status  08/05/2020 79 > OR = 60 mL/min/1.75m2 Final   GFR, Est Non African American  Date Value Ref Range Status  08/05/2020 69 > OR = 60 mL/min/1.31m2 Final   GFR  Date Value Ref Range Status  01/23/2020 67.67 >60.00 mL/min Final    Comment:    Calculated using the CKD-EPI Creatinine Equation (2021)   eGFR  Date Value Ref Range Status  08/04/2022 74 > OR = 60 mL/min/1.70m2 Final         Passed - Patient is not pregnant      Passed - Valid encounter within last 12 months    Recent Outpatient Visits           2 weeks ago Type 2  diabetes mellitus with foot ulcer, without long-term current use of insulin [facility])   [place] [facility] [place], Salvadore Oxford, NP   1 month ago Reactive depression   [place] [facility] [place], Salvadore Oxford, NP   3 months ago Encounter for general adult medical examination with abnormal findings   [place] [facility] [place], Salvadore Oxford, NP   4 months ago Hearing loss due to cerumen impaction, left   [place] [facility] [place], [place] W, NP   7 months ago Type 2 diabetes mellitus without complication, without long-term current use of insulin [facility])   [place] [facility] [place], Salvadore Oxford, NP       Future Appointments             In 6 days Antonieta Iba,  MD [facility] [place]   In 6 days [place], Salvadore Oxford, NP [facility]   In 2 months Teller, Salvadore Oxford, NP [facility]             lamoTRIgine (LAMICTAL) 100 MG tablet [place] Med Name: LAMOTRIGINE 100MG  TABLET] 90 tablet 0    Sig: TAKE ONE TABLET BY MOUTH EVERY MORNING     Neurology:  Anticonvulsants - lamotrigine Passed - 11/23/2022  2:17 PM      Passed - Cr in normal range and within 360 days    Creat  Date Value Ref Range Status  08/04/2022 1.15 0.70 - 1.30 mg/dL Final   Creatinine, Urine  Date Value Ref Range Status  08/04/2022 71 20 - 320 mg/dL Final         Passed - ALT in normal range and within 360 days    ALT  Date Value Ref Range Status  08/04/2022 12 9 - 46 U/L Final         Passed - AST in normal range and within 360 days    AST  Date Value Ref Range Status  08/04/2022 16 10 - 35 U/L Final         Passed - Completed PHQ-2 or PHQ-9 in the last 360 days      Passed - Valid encounter within last 12 months    Recent Outpatient Visits           2 weeks ago Type 2 diabetes mellitus with foot ulcer, without long-term current use of insulin [facility])    [place] [facility] [place], Salvadore Oxford, NP   1 month ago Reactive depression   [place] [facility] [place], Salvadore Oxford, NP   3 months ago Encounter for general adult medical examination with abnormal findings   [place] [facility] [place], Salvadore Oxford, NP   4 months ago Hearing loss due to cerumen impaction, left   [place] [facility] [place], [place] W, NP   7 months ago Type 2 diabetes mellitus without complication, without long-term current use of insulin [facility])   [place] [facility] [place], Salvadore Oxford, NP       Future Appointments             In 6 days Gollan, Tollie Pizza, MD [facility] [place]   In 6 days Baity, Salvadore Oxford, NP [place] [facility]   In 2 months [place], Salvadore Oxford, NP [facility], [facility]

## 2022-11-29 NOTE — Progress Notes (Unsigned)
Cardiology Office Note  Date:  11/29/2022   ID:  Anthony Castillo, DOB 03-Mar-1965, MRN [id number]  PCP:  Lorre Munroe, NP   No chief complaint on file.   HPI:  [name] is a 57 year old gentleman with history of Morbid obesity Nonsmoker, dips Chronic shortness of breath Hyperlipidemia Hypertension CT coronary calcium scoring discussed, score 103 Sleep apnea, currently not on CPAP Osteoarthritis Who presents for follow-up of his chest pain, shortness of breath, leg swelling  Last seen by myself in clinic August 2024   BMI 49  In follow-up today he reports having worsening shortness of breath since the beginning of the year Has appreciated more cough, lower extremity edema Denies chest pain More fatigue  EKG showing new atrial fibrillation, rate 117 bpm  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  PSH:    Past Surgical History:  Procedure Laterality Date   CARPAL TUNNEL RELEASE Right 05/17/2013   Procedure: RIGHT CARPAL TUNNEL RELEASE;  Surgeon: Wyn Forster., MD;  Location: [facility];  Service: Orthopedics;  Laterality: Right;   CARPAL TUNNEL RELEASE Left 07/19/2013   Procedure: LEFT CARPAL TUNNEL RELEASE;  Surgeon: Wyn Forster., MD;  Location: [facility];  Service: Orthopedics;  Laterality: Left;   FOOT SURGERY Right 01/2015   KNEE ARTHROSCOPY  6/14   right   TONSILLECTOMY     ULNAR NERVE TRANSPOSITION Right 05/17/2013   Procedure: RIGHT ULNAR NERVE DECOMPRESSION;  Surgeon:  Wyn Forster., MD;  Location: [facility];  Service: Orthopedics;  Laterality: Right;   WISDOM TOOTH EXTRACTION      Current Outpatient Medications  Medication Sig Dispense Refill   Acetaminophen (TYLENOL ARTHRITIS PAIN PO) Take by mouth as needed.     albuterol (VENTOLIN HFA) 108 (90 Base) MCG/ACT inhaler Inhale 2 puffs into the lungs every 6 (six) hours as needed for wheezing or shortness of breath. 8 g 6   apixaban (ELIQUIS) 5 MG TABS tablet Take 1 tablet (5 mg total) by mouth 2 (two) times daily. 180 tablet 3   augmented betamethasone dipropionate (DIPROLENE-AF) 0.05 % ointment Apply topically. (Patient not taking: Reported on 11/11/2022)     buPROPion (WELLBUTRIN SR) 150 MG 12 hr tablet TAKE ONE TABLET BY MOUTH ONCE A DAY 90 tablet 0   celecoxib (CELEBREX) 100 MG capsule TAKE ONE CAPSULE BY MOUTH TWICE DAILY 180 capsule 0   cetirizine (ZYRTEC) 10 MG tablet Take 10 mg by mouth daily.     Cholecalciferol (VITAMIN D3) 1000 units CAPS Take 1 capsule by mouth daily.     diltiazem (CARDIZEM CD) 180 MG 24 hr capsule Take 1 capsule (180 mg total) by mouth daily. 90 capsule 3   Dulaglutide (TRULICITY) 0.75 MG/0.5ML SOPN Inject 0.75 mg into the skin once a week. 6 mL 0   ezetimibe (ZETIA) 10 MG tablet Take 1 tablet (10 mg total) by mouth daily. 90 tablet 0   fluocinonide cream (LIDEX) 0.05 % as needed.   1   FLUoxetine (PROZAC) 40 MG capsule TAKE ONE CAPSULE BY MOUTH  ONCE DAILY 90 capsule 0   furosemide (LASIX) 20 MG tablet Take 1 tablet (20 mg total) by mouth daily. 90 tablet 3   glipiZIDE (GLUCOTROL) 10 MG tablet TAKE ONE TABLET BY MOUTH TWICE A DAY BEFORE A MEAL. 180 tablet 0   JANUVIA 100 MG tablet TAKE ONE TABLET BY MOUTH ONCE A DAY 90 tablet 0   lamoTRIgine (LAMICTAL) 100 MG tablet TAKE ONE TABLET BY MOUTH EVERY MORNING 90 tablet 0   lisinopril (ZESTRIL) 10 MG tablet Take 1 tablet (10 mg total) by mouth daily. 90 tablet 1   metoprolol succinate (TOPROL-XL) 50 MG 24 hr  tablet Take 1 tablet (50 mg total) by mouth daily. Take with or immediately following a meal. 90 tablet 3   nystatin-triamcinolone ointment (MYCOLOG) Apply 1 Application topically 2 (two) times daily. 30 g 0   pantoprazole (PROTONIX) 40 MG tablet TAKE ONE TABLET BY MOUTH ONCE A DAY 90 tablet 0   potassium chloride (KLOR-CON) 10 MEQ tablet TAKE ONE TABLET BY MOUTH ONCE A DAY 90 tablet 1   rosuvastatin (CRESTOR) 10 MG tablet Take 1 tablet (10 mg total) by mouth daily. PLEASE KEEP SCHEDULED VISIT FOR FURTHER REFILLS. THANK YOU! 90 tablet 0   tamsulosin (FLOMAX) 0.4 MG CAPS capsule TAKE ONE CAPSULE BY MOUTH ONCE DAILY 90 capsule 0   No current facility-administered medications for this visit.    Allergies:   Penicillins   Social History:  The patient  reports that he has never smoked. He has never been exposed to tobacco smoke. His smokeless tobacco use includes chew. He reports current alcohol use. He reports that he does not use drugs.   Family History:   family history includes Arthritis in his paternal grandmother; Diabetes in his maternal grandmother and sister; Heart Problems in his son; Heart disease in his father and paternal grandfather; Hypertension in his father; Irritable bowel syndrome in his daughter; Lymphoma in his mother; Migraines in his daughter; Stroke in his paternal uncle.    Review of Systems: Review of Systems  Constitutional: Negative.   HENT: Negative.    Respiratory:  Positive for shortness of breath.   Cardiovascular:  Positive for leg swelling.  Gastrointestinal: Negative.   Musculoskeletal: Negative.   Neurological: Negative.   Psychiatric/Behavioral: Negative.    All other systems reviewed and are negative.   PHYSICAL EXAM: VS:  There were no vitals taken for this visit. , BMI There is no height or weight on file to calculate BMI. Constitutional:  oriented to person, place, and time. No distress.  HENT:  Head: Grossly normal Eyes:  no discharge. No  scleral icterus.  Neck: No JVD, no carotid bruits  Cardiovascular: Irregularly irregular, no murmurs appreciated Trace lower extremity edema Pulmonary/Chest: Clear to auscultation bilaterally, no wheezes or rails Abdominal: Soft.  no distension.  no tenderness.  Musculoskeletal: Normal range of motion Neurological:  normal muscle tone. Coordination normal. No atrophy Skin: Skin warm and dry Psychiatric: normal affect, pleasant   Recent Labs: 08/04/2022: ALT 12; BUN 13; Creat 1.15; Hemoglobin 14.6; Platelets 205; Potassium 3.9; Sodium 132    Lipid Panel Lab Results  Component Value Date   CHOL 79 08/04/2022   HDL 25 (L) 08/04/2022   LDLCALC 21 08/04/2022   TRIG 284 (H) 08/04/2022      Wt Readings from Last 3 Encounters:  11/11/22 (!) 352 lb (159.7 kg)  11/04/22 (!) 359 lb (162.8 kg)  10/18/22 (!) 360 lb (163.3 kg)      ASSESSMENT  AND PLAN:  New onset atrial fibrillation with RVR Timing of onset unclear, Worsening show once of breath leg swelling cough over the past 6 months Last EKG July 2023 normal sinus rhythm Rate elevated today, medication changes as below we will add metoprolol succinate 50 daily Start diltiazem ER 180 daily Hold amlodipine, stop lisinopril HCTZ, stop aspirin Start Eliquis 5 twice daily Start Lasix 20 daily, continue potassium 10 daily Started discussion concerning options to restore normal sinus rhythm This would likely be complicated secondary to poorly controlled sleep apnea  Obstructive sleep apnea Recommended he reconnect with ENT, check [place] for further discussion concerning management of his sleep apnea Possible trigger for his atrial fibrillation  Type 2 diabetes poor control A1c  8.6 Low carbohydrate diet recommended  Coronary calcification Low calcium score of 100   Lower extremity edema Trace edema on today's visit Possibly exacerbated by atrial fibrillation with RVR, fluid retention  shortness of breath -   Deconditioning, morbid obesity likely contributing Symptoms exacerbated by atrial fibrillation with RVR  Hypertension Medication changes as above   Total encounter time more than 40 minutes  Greater than 50% was spent in counseling and coordination of care with the patient   No orders of the defined types were placed in this encounter.   Signed, Dossie Arbour, M.D., Ph.D. 11/29/2022  [facility] [place], [place] [phone]

## 2022-11-29 NOTE — H&P (View-Only) (Signed)
Cardiology Office Note  Date:  11/30/2022   ID:  Anthony Castillo, DOB [birth year], MRN [id number]  PCP:  Lorre Munroe, NP   Chief Complaint  Patient presents with   1 month follow up     Patient c/o shortness of breath & mild LE edema. Medications reviewed by the patient verbally.     HPI:  [name] is a 57 year old gentleman with history of Morbid obesity Nonsmoker, dips Chronic shortness of breath Hyperlipidemia Hypertension CT coronary calcium scoring discussed, score 103 Sleep apnea, currently not on CPAP Osteoarthritis Who presents for follow-up of his chest pain, shortness of breath, leg swelling, persistent atrial fibrillation  Last seen by myself in clinic August 2024 Presents today in atrial fibrillation rate 67 bpm On metoprolol, diltiazem, Lasix 20 daily  Echocardiogram November 03, 2022 Normal left and right ventricular size and function No significant valvular heart disease Left atrium mildly dilated  He reports difficulty tolerating face CPAP, would get an air leak and machine would shut down and he would feel like he was suffocating so he stopped using it years ago Seen by Dr. 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 rate has decreased BY  50 BPM T wave inversion now evident in Anterolateral leads Confirmed by Julien Nordmann [phone]) on 11/30/2022 9:28:16 AM     PMH:   has a past medical history of Chronic pain (04/09/2011), DEPRESSION (03/16/2007), GERD (gastroesophageal reflux disease), HYPERTENSION (09/22/2006), Hypogonadism male (04/09/2011), Morbid obesity (HCC) (09/22/2006), Osteoarthritis (04/09/2011), Sleep apnea, and Wears glasses.  PSH:    Past Surgical History:  Procedure Laterality Date   CARPAL TUNNEL RELEASE Right 05/17/2013   Procedure: RIGHT CARPAL TUNNEL RELEASE;  Surgeon: Wyn Forster., MD;  Location: [facility];  Service: Orthopedics;  Laterality: Right;   CARPAL TUNNEL RELEASE Left 07/19/2013   Procedure: LEFT CARPAL TUNNEL RELEASE;  Surgeon: Wyn Forster., MD;  Location: [facility];  Service: Orthopedics;  Laterality: Left;   FOOT SURGERY Right 01/2015   KNEE ARTHROSCOPY  6/14   right   TONSILLECTOMY     ULNAR NERVE TRANSPOSITION Right 05/17/2013   Procedure: RIGHT ULNAR NERVE DECOMPRESSION;  Surgeon: Wyn Forster., MD;  Location:  SUR[facility]ERY [facility];  Service: Orthopedics;  Laterality: Right;   WISDOM TOOTH EXTRACTION      Current Outpatient Medications  Medication Sig Dispense Refill   Acetaminophen (TYLENOL ARTHRITIS PAIN PO) Take by mouth as needed.     albuterol (VENTOLIN HFA) 108 (90 Base) MCG/ACT inhaler Inhale 2 puffs into the lungs every 6 (six) hours as needed for wheezing or shortness of breath. 8 g 6   apixaban (ELIQUIS) 5 MG TABS tablet Take 1 tablet (5 mg  total) by mouth 2 (two) times daily. 180 tablet 3   buPROPion (WELLBUTRIN SR) 150 MG 12 hr tablet TAKE ONE TABLET BY MOUTH ONCE A DAY 90 tablet 0   celecoxib (CELEBREX) 100 MG capsule TAKE ONE CAPSULE BY MOUTH TWICE DAILY 180 capsule 0   cetirizine (ZYRTEC) 10 MG tablet Take 10 mg by mouth daily.     Cholecalciferol (VITAMIN D3) 1000 units CAPS Take 1 capsule  by mouth daily.     diltiazem (CARDIZEM CD) 180 MG 24 hr capsule Take 1 capsule (180 mg total) by mouth daily. 90 capsule 3   ezetimibe (ZETIA) 10 MG tablet Take 1 tablet (10 mg total) by mouth daily. 90 tablet 0   fluocinonide cream (LIDEX) 0.05 % as needed.   1   FLUoxetine (PROZAC) 40 MG capsule TAKE ONE CAPSULE BY MOUTH ONCE DAILY 90 capsule 0   furosemide (LASIX) 20 MG tablet Take 1 tablet (20 mg total) by mouth daily. 90 tablet 3   glipiZIDE (GLUCOTROL) 10 MG tablet TAKE ONE TABLET BY MOUTH TWICE A DAY BEFORE A MEAL. 180 tablet 0   JANUVIA 100 MG tablet TAKE ONE TABLET BY MOUTH ONCE A DAY 90 tablet 0   lamoTRIgine (LAMICTAL) 100 MG tablet TAKE ONE TABLET BY MOUTH EVERY MORNING 90 tablet 0   lisinopril-hydrochlorothiazide (ZESTORETIC) 20-12.5 MG tablet Take 1 tablet by mouth 2 (two) times daily.     metoprolol succinate (TOPROL-XL) 50 MG 24 hr tablet Take 1 tablet (50 mg total) by mouth daily. Take with or immediately following a meal. 90 tablet 3   nystatin-triamcinolone ointment (MYCOLOG) Apply 1 Application topically 2 (two) times daily. 30 g 0   pantoprazole (PROTONIX) 40 MG tablet TAKE ONE TABLET BY MOUTH ONCE A DAY 90 tablet 0   potassium chloride (KLOR-CON) 10 MEQ tablet TAKE ONE TABLET BY MOUTH ONCE A DAY 90 tablet 1   rosuvastatin (CRESTOR) 10 MG tablet Take 1 tablet (10 mg total) by mouth daily. PLEASE KEEP SCHEDULED VISIT FOR FURTHER REFILLS. THANK YOU! 90 tablet 0   tamsulosin (FLOMAX) 0.4 MG CAPS capsule TAKE ONE CAPSULE BY MOUTH ONCE DAILY 90 capsule 0   augmented betamethasone dipropionate (DIPROLENE-AF) 0.05 % ointment Apply topically. (Patient not taking: Reported on 11/11/2022)     Dulaglutide (TRULICITY) 0.75 MG/0.5ML SOPN Inject 0.75 mg into the skin once a week. (Patient not taking: Reported on 11/30/2022) 6 mL 0   lisinopril (ZESTRIL) 10 MG tablet Take 1 tablet (10 mg total) by mouth daily. (Patient not taking: Reported on 11/30/2022) 90 tablet 1   No current  facility-administered medications for this visit.    Allergies:   Penicillins   Social History:  The patient  reports that he has never smoked. He has never been exposed to tobacco smoke. His smokeless tobacco use includes chew. He reports current alcohol use. He reports that he does not use drugs.   Family History:   family history includes Arthritis in his paternal grandmother; Diabetes in his maternal grandmother and sister; Heart Problems in his son; Heart disease in his father and paternal grandfather; Hypertension in his father; Irritable bowel syndrome in his daughter; Lymphoma in his mother; Migraines in his daughter; Stroke in his paternal uncle.    Review of Systems: Review of Systems  Constitutional: Negative.   HENT: Negative.    Respiratory:  Positive for shortness of breath.   Cardiovascular:  Positive for leg swelling.  Gastrointestinal: Negative.   Musculoskeletal: Negative.   Neurological: Negative.  Psychiatric/Behavioral: Negative.    All other systems reviewed and are negative.   PHYSICAL EXAM: VS:  BP (!) 140/70 (BP Location: Left Arm, Patient Position: Sitting, Cuff Size: Large)   Pulse 67   Ht 6' (1.829 m)   Wt (!) 351 lb 8 oz (159.4 kg)   SpO2 98%   BMI 47.67 kg/m  , BMI Body mass index is 47.67 kg/m. Constitutional:  oriented to person, place, and time. No distress.  HENT:  Head: Grossly normal Eyes:  no discharge. No scleral icterus.  Neck: No JVD, no carotid bruits  Cardiovascular: Irregularly Irregular, no murmurs appreciated Pulmonary/Chest: Clear to auscultation bilaterally, no wheezes or rails Abdominal: Soft.  no distension.  no tenderness.  Musculoskeletal: Normal range of motion Neurological:  normal muscle tone. Coordination normal. No atrophy Skin: Skin warm and dry Psychiatric: normal affect, pleasantm3   Recent Labs: 08/04/2022: ALT 12; BUN 13; Creat 1.15; Hemoglobin 14.6; Platelets 205; Potassium 3.9; Sodium 132    Lipid  Panel Lab Results  Component Value Date   CHOL 79 08/04/2022   HDL 25 (L) 08/04/2022   LDLCALC 21 08/04/2022   TRIG 284 (H) 08/04/2022      Wt Readings from Last 3 Encounters:  11/30/22 (!) 351 lb 8 oz (159.4 kg)  11/11/22 (!) 352 lb (159.7 kg)  11/04/22 (!) 359 lb (162.8 kg)      ASSESSMENT AND PLAN:  atrial fibrillation, persistent Timing of onset unclear, possibly 6 months given shortness of breath coughing leg swelling  EKG July 2023 normal sinus rhythm Tolerating metoprolol succinate 50 daily, diltiazem ER 180 daily with better rate control Continue Eliquis 5 twice daily Continue Lasix 20 daily, potassium 10 daily We will schedule for cardioversion later this week if schedule permits Given poorly controlled sleep apnea, high risk for recurrent arrhythmia  Obstructive sleep apnea Seen by pulmonary September 2024, recommendation for home sleep study Reports he has not received any information We will reach out to [facility] Likely playing a role in his atrial fibrillation and will in the ongoing management of his arrhythmia  Type 2 diabetes poor control A1c  8.6 Low carbohydrate diet recommended  Coronary calcification Low calcium score of 100   Lower extremity edema Continue Lasix daily with potassium Likely exacerbated by calcium channel blocker and atrial fibrillation with RVR  shortness of breath -  Deconditioning, morbid obesity likely contributing as well as atrial fibrillation  Hypertension Blood pressure is well controlled on today's visit. No changes made to the medications.    Total encounter time more than 40 minutes  Greater than 50% was spent in counseling and coordination of care with the patient   Orders Placed This Encounter  Procedures   EKG 12-Lead    Signed, Dossie Arbour, M.D., Ph.D. 11/30/2022  [facility] [place], [place] [phone]

## 2022-11-30 ENCOUNTER — Encounter: Payer: Self-pay | Admitting: Internal Medicine

## 2022-11-30 ENCOUNTER — Ambulatory Visit (INDEPENDENT_AMBULATORY_CARE_PROVIDER_SITE_OTHER): Payer: 59 | Admitting: Internal Medicine

## 2022-11-30 ENCOUNTER — Ambulatory Visit: Payer: 59 | Attending: Cardiovascular Disease | Admitting: Cardiovascular Disease

## 2022-11-30 ENCOUNTER — Encounter: Payer: Self-pay | Admitting: Cardiovascular Disease

## 2022-11-30 ENCOUNTER — Other Ambulatory Visit: Payer: Self-pay | Admitting: Cardiovascular Disease

## 2022-11-30 VITALS — BP 136/72 | HR 85 | Temp 95.9°F | Wt 352.0 lb

## 2022-11-30 VITALS — BP 140/70 | HR 67 | Ht 72.0 in | Wt 351.5 lb

## 2022-11-30 DIAGNOSIS — L97509 Non-pressure chronic ulcer of other part of unspecified foot with unspecified severity: Secondary | ICD-10-CM | POA: Diagnosis not present

## 2022-11-30 DIAGNOSIS — Z79899 Other long term (current) drug therapy: Secondary | ICD-10-CM

## 2022-11-30 DIAGNOSIS — I10 Essential (primary) hypertension: Secondary | ICD-10-CM | POA: Diagnosis not present

## 2022-11-30 DIAGNOSIS — R600 Localized edema: Secondary | ICD-10-CM | POA: Diagnosis not present

## 2022-11-30 DIAGNOSIS — R0602 Shortness of breath: Secondary | ICD-10-CM

## 2022-11-30 DIAGNOSIS — E782 Mixed hyperlipidemia: Secondary | ICD-10-CM

## 2022-11-30 DIAGNOSIS — E66813 Obesity, class 3: Secondary | ICD-10-CM | POA: Diagnosis not present

## 2022-11-30 DIAGNOSIS — E11621 Type 2 diabetes mellitus with foot ulcer: Secondary | ICD-10-CM

## 2022-11-30 DIAGNOSIS — G4733 Obstructive sleep apnea (adult) (pediatric): Secondary | ICD-10-CM

## 2022-11-30 DIAGNOSIS — Z6841 Body Mass Index (BMI) 40.0 and over, adult: Secondary | ICD-10-CM | POA: Diagnosis not present

## 2022-11-30 DIAGNOSIS — I4819 Other persistent atrial fibrillation: Secondary | ICD-10-CM

## 2022-11-30 DIAGNOSIS — E1165 Type 2 diabetes mellitus with hyperglycemia: Secondary | ICD-10-CM

## 2022-11-30 MED ORDER — EMPAGLIFLOZIN 25 MG PO TABS: 25.0000 mg | ORAL_TABLET | Freq: Every day | ORAL | 1 refills | Status: DC

## 2022-11-30 NOTE — Patient Instructions (Signed)
Cardioversion for atrial fibrillation  Medication Instructions:  No changes  If you need a refill on your cardiac medications before your next appointment, please call your pharmacy.   Lab work: Your provider would like for you to have following labs drawn today CBC and BMET.    Testing/Procedures:    Dear Anthony Castillo  You are scheduled for a Cardioversion on Thursday, October 3 with Dr. Mariah Milling.  Please arrive at the [facility] Entrance of [facility], [address], [place] [address] at 6:30 AM (This is 1 hour(s) prior to your procedure time).  Proceed to the Check-In Desk directly inside the entrance.  Procedure Parking: Use the entrance off of the [address] side of the hospital. Turn right upon entering and follow the driveway to parking that is directly in front of the [facility] Center. There is no valet parking available at this entrance, however there is an awning directly in front of the [facility] Vascular Center for drop off/ pick up for patients.   DIET:  Nothing to eat or drink after midnight except a sip of water with medications (see medication instructions below)  MEDICATION INSTRUCTIONS: !!IF ANY NEW MEDICATIONS ARE STARTED AFTER TODAY, PLEASE NOTIFY YOUR PROVIDER AS SOON AS POSSIBLE!!  FYI: Medications such as Semaglutide (Ozempic, [place]), Tirzepatide (Mounjaro, Zepbound), Dulaglutide (Trulicity), etc ("GLP1 agonists") AND Canagliflozin (Invokana), Dapagliflozin (Farxiga), Empagliflozin (Jardiance), Ertugliflozin (Steglatro), Bexagliflozin [employer]) or any combination with one of these drugs such as Invokamet (Canagliflozin/Metformin), Synjardy (Empagliflozin/Metformin), etc ("SGLT2 inhibitors") must be held around the time of a procedure. This is not a comprehensive list of all of these drugs. Please review all of your medications and talk to your provider if you take any one of these. If you are not sure, ask your provider.    Please hold  Trulicity for one week prior to procedure.   Please hold hold Glipizide and Januvia the morning of your procedure.   Continue taking your anticoagulant (blood thinner): Apixaban (Eliquis).  You will need to continue this after your procedure until you are told by your provider that it is safe to stop.    FYI:  For your safety, and to allow [place] to monitor your vital signs accurately during the surgery/procedure we request: If you have artificial nails, gel coating, SNS etc, please have those removed prior to your surgery/procedure. Not having the nail coverings /polish removed may result in cancellation or delay of your surgery/procedure.  You must have a responsible person to drive you home and stay in the waiting area during your procedure. Failure to do so could result in cancellation.  Bring your insurance cards.  *Special Note: Every effort is made to have your procedure done on time. Occasionally there are emergencies that occur at the hospital that may cause delays. Please be patient if a delay does occur.      Follow-Up: At [facility], you and your health needs are our priority.  As part of our continuing mission to provide you with exceptional heart care, we have created designated Provider Care Teams.  These Care Teams include your primary Cardiologist (physician) and Advanced Practice Providers (APPs -  Physician Assistants and Nurse Practitioners) who all work together to provide you with the care you need, when you need it.  You will need a follow up appointment in 1 month  Providers on your designated Care Team:   Nicolasa Ducking, NP Eula Listen, PA-C Cadence Fransico Michael, [place]  COVID-19 Vaccine  Information can be found at: [email or web address] For questions related to vaccine distribution or appointments, please email [email or web address] or call [phone].

## 2022-11-30 NOTE — Progress Notes (Unsigned)
Subjective:    Patient ID: Anthony Castillo, male    DOB: [birth year], 57 y.o.   MRN: [id number]  HPI  Patient presents to clinic today for follow-up of DM2.  He reports elevated blood sugars ranging from 200-300.  His last A1c was 10%, 10/2022.  He is taking glipizide and januvia as prescribed and was recently prescribed on Trulicity but he has not started this yet.  He was advised by his cardiologist to wait until after his cardioversion on Thursday.  He has been trying to reduce carb intake and has cut out sodas completely.  Review of Systems     Past Medical History:  Diagnosis Date   Chronic pain 04/09/2011   DEPRESSION 03/16/2007   GERD (gastroesophageal reflux disease)    HYPERTENSION 09/22/2006   Hypogonadism male 04/09/2011   Morbid obesity (HCC) 09/22/2006   Osteoarthritis 04/09/2011   Sleep apnea    uses a cpap-will use after surgery   Wears glasses     Current Outpatient Medications  Medication Sig Dispense Refill   Acetaminophen (TYLENOL ARTHRITIS PAIN PO) Take by mouth as needed.     albuterol (VENTOLIN HFA) 108 (90 Base) MCG/ACT inhaler Inhale 2 puffs into the lungs every 6 (six) hours as needed for wheezing or shortness of breath. 8 g 6   apixaban (ELIQUIS) 5 MG TABS tablet Take 1 tablet (5 mg total) by mouth 2 (two) times daily. 180 tablet 3   augmented betamethasone dipropionate (DIPROLENE-AF) 0.05 % ointment Apply topically. (Patient not taking: Reported on 11/11/2022)     buPROPion (WELLBUTRIN SR) 150 MG 12 hr tablet TAKE ONE TABLET BY MOUTH ONCE A DAY 90 tablet 0   celecoxib (CELEBREX) 100 MG capsule TAKE ONE CAPSULE BY MOUTH TWICE DAILY 180 capsule 0   cetirizine (ZYRTEC) 10 MG tablet Take 10 mg by mouth daily.     Cholecalciferol (VITAMIN D3) 1000 units CAPS Take 1 capsule by mouth daily.     diltiazem (CARDIZEM CD) 180 MG 24 hr capsule Take 1 capsule (180 mg total) by mouth daily. 90 capsule 3   Dulaglutide (TRULICITY) 0.75 MG/0.5ML SOPN Inject 0.75 mg into the skin  once a week. (Patient not taking: Reported on 11/30/2022) 6 mL 0   ezetimibe (ZETIA) 10 MG tablet Take 1 tablet (10 mg total) by mouth daily. 90 tablet 0   fluocinonide cream (LIDEX) 0.05 % as needed.   1   FLUoxetine (PROZAC) 40 MG capsule TAKE ONE CAPSULE BY MOUTH ONCE DAILY 90 capsule 0   furosemide (LASIX) 20 MG tablet Take 1 tablet (20 mg total) by mouth daily. 90 tablet 3   glipiZIDE (GLUCOTROL) 10 MG tablet TAKE ONE TABLET BY MOUTH TWICE A DAY BEFORE A MEAL. 180 tablet 0   JANUVIA 100 MG tablet TAKE ONE TABLET BY MOUTH ONCE A DAY 90 tablet 0   lamoTRIgine (LAMICTAL) 100 MG tablet TAKE ONE TABLET BY MOUTH EVERY MORNING 90 tablet 0   lisinopril (ZESTRIL) 10 MG tablet Take 1 tablet (10 mg total) by mouth daily. (Patient not taking: Reported on 11/30/2022) 90 tablet 1   lisinopril-hydrochlorothiazide (ZESTORETIC) 20-12.5 MG tablet Take 1 tablet by mouth 2 (two) times daily.     metoprolol succinate (TOPROL-XL) 50 MG 24 hr tablet Take 1 tablet (50 mg total) by mouth daily. Take with or immediately following a meal. 90 tablet 3   nystatin-triamcinolone ointment (MYCOLOG) Apply 1 Application topically 2 (two) times daily. 30 g 0   pantoprazole (PROTONIX) 40 MG  tablet TAKE ONE TABLET BY MOUTH ONCE A DAY 90 tablet 0   potassium chloride (KLOR-CON) 10 MEQ tablet TAKE ONE TABLET BY MOUTH ONCE A DAY 90 tablet 1   rosuvastatin (CRESTOR) 10 MG tablet Take 1 tablet (10 mg total) by mouth daily. PLEASE KEEP SCHEDULED VISIT FOR FURTHER REFILLS. THANK YOU! 90 tablet 0   tamsulosin (FLOMAX) 0.4 MG CAPS capsule TAKE ONE CAPSULE BY MOUTH ONCE DAILY 90 capsule 0   No current facility-administered medications for this visit.    Allergies  Allergen Reactions   Penicillins     REACTION: unspecified    Family History  Problem Relation Age of Onset   Lymphoma Mother    Heart disease Father    Hypertension Father    Diabetes Sister    Diabetes Maternal Grandmother    Arthritis Paternal Grandmother     Heart disease Paternal Grandfather    Migraines Daughter    Irritable bowel syndrome Daughter    Heart Problems Son        valve    Stroke Paternal Uncle    Colon cancer Neg Hx    Prostate cancer Neg Hx     Social History   Socioeconomic History   Marital status: Married    Spouse name: Not on file   Number of children: Not on file   Years of education: Not on file   Highest education level: Not on file  Occupational History   Not on file  Tobacco Use   Smoking status: Never    Passive exposure: Never   Smokeless tobacco: Current    Types: Chew   Tobacco comments:    has been using chew 40 years  Vaping Use   Vaping status: Never Used  Substance and Sexual Activity   Alcohol use: Yes    Comment: rare   Drug use: Never   Sexual activity: Yes  Other Topics Concern   Not on file  Social History Narrative   Not on file   Social Determinants of Health   Financial Resource Strain: Not on file  Food Insecurity: Not on file  Transportation Needs: Not on file  Physical Activity: Not on file  Stress: Not on file  Social Connections: Not on file  Intimate Partner Violence: Not on file     Constitutional: Patient reports chronic fatigue.  Denies fever, malaise, headache or abrupt weight changes.  HEENT: Denies eye pain, eye redness, ear pain, ringing in the ears, wax buildup, runny nose, nasal congestion, bloody nose, or sore throat. Respiratory: Denies difficulty breathing, shortness of breath, cough or sputum production.   Cardiovascular: Denies chest pain, chest tightness, palpitations or swelling in the hands or feet.  Gastrointestinal: Denies abdominal pain, bloating, constipation, diarrhea or blood in the stool.  GU: Denies urgency, frequency, pain with urination, burning sensation, blood in urine, odor or discharge. Musculoskeletal: Patient reports joint pain.  Denies decrease in range of motion, difficulty with gait, muscle pain or joint swelling.  Skin: Denies  redness, rashes, lesions or ulcercations.  Neurological: Patient reports insomnia.  Denies dizziness, difficulty with memory, difficulty with speech or problems with balance and coordination.  Psych: Patient history of depression.  Denies anxiety, SI/HI.  No other specific complaints in a complete review of systems (except as listed in HPI above).  Objective:   Physical Exam BP 136/72 (BP Location: Left Arm, Patient Position: Sitting, Cuff Size: Large)   Pulse 85   Temp (!) 95.9 F (35.5 C) (Temporal)  Wt (!) 352 lb (159.7 kg)   SpO2 96%   BMI 47.74 kg/m   Wt Readings from Last 3 Encounters:  11/30/22 (!) 351 lb 8 oz (159.4 kg)  11/11/22 (!) 352 lb (159.7 kg)  11/04/22 (!) 359 lb (162.8 kg)    General: Appears his stated age, is, in NAD. Skin: Warm, dry and intact. No  ulcerations noted. Cardiovascular: Normal rate with irregular rhythm. S1,S2 noted.  No murmur, rubs or gallops noted. No JVD or BLE edema. No carotid bruits noted. Pulmonary/Chest: Normal effort and positive vesicular breath sounds. No respiratory distress. No wheezes, rales or ronchi noted.  Neurological: Alert and oriented.   BMET    Component Value Date/Time   NA 132 (L) 08/04/2022 1149   K 3.9 08/04/2022 1149   CL 97 (L) 08/04/2022 1149   CO2 26 08/04/2022 1149   GLUCOSE 197 (H) 08/04/2022 1149   BUN 13 08/04/2022 1149   CREATININE 1.15 08/04/2022 1149   CALCIUM 8.9 08/04/2022 1149   GFRNONAA 69 08/05/2020 0955   GFRAA 79 08/05/2020 0955    Lipid Panel     Component Value Date/Time   CHOL 79 08/04/2022 1149   TRIG 284 (H) 08/04/2022 1149   HDL 25 (L) 08/04/2022 1149   CHOLHDL 3.2 08/04/2022 1149   VLDL 28.0 01/23/2020 1413   LDLCALC 21 08/04/2022 1149    CBC    Component Value Date/Time   WBC 6.0 08/04/2022 1149   RBC 5.55 08/04/2022 1149   HGB 14.6 08/04/2022 1149   HGB 13.7 04/15/2021 1024   HCT 45.0 08/04/2022 1149   HCT 40.3 04/15/2021 1024   PLT 205 08/04/2022 1149   PLT 229  04/15/2021 1024   MCV 81.1 08/04/2022 1149   MCV 80 04/15/2021 1024   MCH 26.3 (L) 08/04/2022 1149   MCHC 32.4 08/04/2022 1149   RDW 14.8 08/04/2022 1149   RDW 13.1 04/15/2021 1024   LYMPHSABS 1.3 04/15/2021 1024   EOSABS 0.1 04/15/2021 1024   BASOSABS 0.1 04/15/2021 1024    Hgb A1C Lab Results  Component Value Date   HGBA1C 10.0 (A) 11/04/2022           Assessment & Plan:       RTC in 2 months for follow-up of chronic conditions Nicki Reaper, NP

## 2022-12-01 ENCOUNTER — Encounter: Payer: Self-pay | Admitting: Internal Medicine

## 2022-12-01 LAB — CBC
Hematocrit: 47.7 % (ref 37.5–51.0)
Hemoglobin: 15 g/dL (ref 13.0–17.7)
MCH: 26.4 pg — ABNORMAL LOW (ref 26.6–33.0)
MCHC: 31.4 g/dL — ABNORMAL LOW (ref 31.5–35.7)
MCV: 84 fL (ref 79–97)
Platelets: 217 10*3/uL (ref 150–450)
RBC: 5.69 x10E6/uL (ref 4.14–5.80)
RDW: 14.7 % (ref 11.6–15.4)
WBC: 5.5 10*3/uL (ref 3.4–10.8)

## 2022-12-01 LAB — BASIC METABOLIC PANEL WITH GFR
BUN/Creatinine Ratio: 11 (ref 9–20)
BUN: 14 mg/dL (ref 6–24)
CO2: 21 mmol/L (ref 20–29)
Calcium: 9.4 mg/dL (ref 8.7–10.2)
Chloride: 95 mmol/L — ABNORMAL LOW (ref 96–106)
Creatinine, Ser: 1.23 mg/dL (ref 0.76–1.27)
Glucose: 226 mg/dL — ABNORMAL HIGH (ref 70–99)
Potassium: 3.9 mmol/L (ref 3.5–5.2)
Sodium: 136 mmol/L (ref 134–144)
eGFR: 68 mL/min/{1.73_m2}

## 2022-12-01 NOTE — Assessment & Plan Note (Signed)
Encourage diet and exercise for weight loss 

## 2022-12-01 NOTE — Assessment & Plan Note (Signed)
Continue glipizide and Januvia We will start Jardiance 25 mg Advised him to go ahead and start the Trulicity 0.75 weekly on Thursday or Friday Reinforced low-carb diet and exercise for weight loss

## 2022-12-01 NOTE — Patient Instructions (Signed)

## 2022-12-02 ENCOUNTER — Ambulatory Visit: Payer: 59 | Admitting: Anesthesiology

## 2022-12-02 ENCOUNTER — Other Ambulatory Visit: Payer: Self-pay

## 2022-12-02 ENCOUNTER — Encounter: Payer: Self-pay | Admitting: Cardiovascular Disease

## 2022-12-02 ENCOUNTER — Ambulatory Visit
Admission: RE | Admit: 2022-12-02 | Discharge: 2022-12-02 | Disposition: A | Discharge status: Home / Self Care | Payer: 59 | Attending: Cardiovascular Disease | Admitting: Cardiovascular Disease

## 2022-12-02 ENCOUNTER — Encounter
Admission: RE | Disposition: A | Discharge status: Home / Self Care | Payer: Self-pay | Source: Home / Self Care | Attending: Cardiovascular Disease

## 2022-12-02 DIAGNOSIS — G4733 Obstructive sleep apnea (adult) (pediatric): Secondary | ICD-10-CM | POA: Insufficient documentation

## 2022-12-02 DIAGNOSIS — I4891 Unspecified atrial fibrillation: Secondary | ICD-10-CM | POA: Diagnosis not present

## 2022-12-02 DIAGNOSIS — R600 Localized edema: Secondary | ICD-10-CM | POA: Insufficient documentation

## 2022-12-02 DIAGNOSIS — I4819 Other persistent atrial fibrillation: Secondary | ICD-10-CM

## 2022-12-02 DIAGNOSIS — R0602 Shortness of breath: Secondary | ICD-10-CM | POA: Insufficient documentation

## 2022-12-02 DIAGNOSIS — I10 Essential (primary) hypertension: Secondary | ICD-10-CM | POA: Diagnosis not present

## 2022-12-02 DIAGNOSIS — Z6841 Body Mass Index (BMI) 40.0 and over, adult: Secondary | ICD-10-CM | POA: Insufficient documentation

## 2022-12-02 DIAGNOSIS — Z79899 Other long term (current) drug therapy: Secondary | ICD-10-CM | POA: Insufficient documentation

## 2022-12-02 DIAGNOSIS — Z7901 Long term (current) use of anticoagulants: Secondary | ICD-10-CM | POA: Diagnosis not present

## 2022-12-02 DIAGNOSIS — F32A Depression, unspecified: Secondary | ICD-10-CM | POA: Insufficient documentation

## 2022-12-02 DIAGNOSIS — K219 Gastro-esophageal reflux disease without esophagitis: Secondary | ICD-10-CM | POA: Diagnosis not present

## 2022-12-02 DIAGNOSIS — E1165 Type 2 diabetes mellitus with hyperglycemia: Secondary | ICD-10-CM | POA: Insufficient documentation

## 2022-12-02 HISTORY — PX: CARDIOVERSION: SHX1299

## 2022-12-02 SURGERY — CARDIOVERSION
Anesthesia: General

## 2022-12-02 MED ORDER — PROPOFOL 10 MG/ML IV BOLUS
INTRAVENOUS | Status: DC | PRN
  Administered 2022-12-02: 40 mg via INTRAVENOUS
  Administered 2022-12-02: 80 mg via INTRAVENOUS
  Administered 2022-12-02: 40 mg via INTRAVENOUS

## 2022-12-02 MED ORDER — PROPOFOL 1000 MG/100ML IV EMUL
INTRAVENOUS | Status: AC
  Filled 2022-12-02: qty 100

## 2022-12-02 MED ORDER — SODIUM CHLORIDE 0.9 % IV SOLN: INTRAVENOUS | Status: DC

## 2022-12-02 NOTE — Interval H&P Note (Signed)
History and Physical Interval Note:  12/02/2022 11:50 AM  Anthony Castillo  has presented today for surgery, with the diagnosis of Cardioversion  Afib.  The various methods of treatment have been discussed with the patient and family. After consideration of risks, benefits and other options for treatment, the patient has consented to  Procedure(s): CARDIOVERSION (N/A) as a surgical intervention.  The patient's history has been reviewed, patient examined, no change in status, stable for surgery.  I have reviewed the patient's chart and labs.  Questions were answered to the patient's satisfaction.     Julien Nordmann

## 2022-12-02 NOTE — Transfer of Care (Signed)
Immediate Anesthesia Transfer of Care Note  Patient: Anthony Castillo  Procedure(s) Performed: CARDIOVERSION  Patient Location: Short Stay  Anesthesia Type:MAC  Level of Consciousness: awake  Airway & Oxygen Therapy: Patient Spontanous Breathing and Patient connected to nasal cannula oxygen  Post-op Assessment: Report given to RN and Post -op Vital signs reviewed and stable  Post vital signs: Reviewed and stable  Last Vitals:  Vitals Value Taken Time  BP 138/87 12/02/22 0809  Temp    Pulse 57 12/02/22 0811  Resp 19 12/02/22 0811  SpO2 99 % 12/02/22 0811    Last Pain:  Vitals:   12/02/22 0713  TempSrc: Oral  PainSc: 0-No pain         Complications: No notable events documented.

## 2022-12-02 NOTE — Anesthesia Procedure Notes (Signed)
Procedure Name: MAC Date/Time: 12/02/2022 7:45 AM  Performed by: Elisabeth Pigeon, CRNAPre-anesthesia Checklist: Suction available, Emergency Drugs available, Patient identified, Patient being monitored and Timeout performed Oxygen Delivery Method: Nasal cannula

## 2022-12-02 NOTE — H&P (Signed)
H&P Addendum, pre-cardioversion ° °Patient was seen and evaluated prior to -cardioversion procedure °Symptoms, prior testing details again confirmed with the patient °Patient examined, no significant change from prior exam °Lab work reviewed in detail personally by myself °Patient understands risk and benefit of the procedure,  °The risks (stroke, cardiac arrhythmias rarely resulting in the need for a temporary or permanent pacemaker, skin irritation or burns and complications associated with conscious sedation including aspiration, arrhythmia, respiratory failure and death), benefits (restoration of normal sinus rhythm) and alternatives of a direct current cardioversion were explained in detail °Patient willing to proceed. ° °Signed, °Tim Da Michelle, MD, Ph.D °[facility]  °

## 2022-12-02 NOTE — Progress Notes (Signed)
Cardioversion procedure note For atrial fibrillation, persistent  Procedure Details:  Consent: Risks of procedure as well as the alternatives and risks of each were explained to the (patient/caregiver).  Consent for procedure obtained.  Time Out: Verified patient identification, verified procedure, site/side was marked, verified correct patient position, special equipment/implants available, medications/allergies/relevent history reviewed, required imaging and test results available.  Performed  Patient placed on cardiac monitor, pulse oximetry, supplemental oxygen as necessary.   Sedation given: propofol IV, Dr. Suzan Slick Pacer pads placed anterior and posterior chest.   Cardioverted 5 time(s).   Cardioverted at 150 J, 200 J, 200 J with chest compression Cardioverted at 200 J with pads anterolateral, 200 J anterolateral pads with manual compression Synchronized biphasic Converted to NSR on shock #5   Evaluation: Findings: Post procedure EKG shows: NSR Complications: None Patient did tolerate procedure well.  Time Spent Directly with the Patient:  45 minutes   Dossie Arbour, M.D., Ph.D.

## 2022-12-02 NOTE — Anesthesia Preprocedure Evaluation (Signed)
Anesthesia Evaluation  Patient identified by MRN, date of birth, ID band Patient awake    Reviewed: Allergy & Precautions, NPO status , Patient's Chart, lab work & pertinent test results  History of Anesthesia Complications Negative for: history of anesthetic complications  Airway Mallampati: III  TM Distance: >3 FB Neck ROM: Full    Dental  (+) Teeth Intact   Pulmonary sleep apnea , neg COPD, Patient abstained from smoking.Not current smoker Patient chews tobacco, last chewed this morning. Denies swallowing his chewing tobacco.   Pulmonary exam normal breath sounds clear to auscultation       Cardiovascular Exercise Tolerance: Good METShypertension, Pt. on medications (-) CAD and (-) Past MI + dysrhythmias Atrial Fibrillation  Rhythm:Irregular Rate:Normal - Systolic murmurs    Neuro/Psych  PSYCHIATRIC DISORDERS  Depression    negative neurological ROS     GI/Hepatic ,GERD  Medicated,,(+)     (-) substance abuse    Endo/Other  diabetes  Morbid obesityPatient not on GLP1 agonist yet  Renal/GU negative Renal ROS     Musculoskeletal   Abdominal  (+) + obese  Peds  Hematology   Anesthesia Other Findings Past Medical History: 04/09/2011: Chronic pain 03/16/2007: DEPRESSION No date: GERD (gastroesophageal reflux disease) 09/22/2006: HYPERTENSION 04/09/2011: Hypogonadism male 09/22/2006: Morbid obesity (HCC) 04/09/2011: Osteoarthritis No date: Sleep apnea     Comment:  uses a cpap-will use after surgery No date: Wears glasses  Reproductive/Obstetrics                             Anesthesia Physical Anesthesia Plan  ASA: 3  Anesthesia Plan: General   Post-op Pain Management: Minimal or no pain anticipated   Induction: Intravenous  PONV Risk Score and Plan: 2 and Propofol infusion, TIVA and Ondansetron  Airway Management Planned: Nasal Cannula  Additional Equipment: None  Intra-op  Plan:   Post-operative Plan:   Informed Consent: I have reviewed the patients History and Physical, chart, labs and discussed the procedure including the risks, benefits and alternatives for the proposed anesthesia with the patient or authorized representative who has indicated his/her understanding and acceptance.     Dental advisory given  Plan Discussed with: CRNA and Surgeon  Anesthesia Plan Comments: (Discussed risks of anesthesia with patient, including possibility of difficulty with spontaneous ventilation under anesthesia necessitating airway intervention, PONV, and rare risks such as cardiac or respiratory or neurological events, and allergic reactions. Patient has chewed tobacco today. Discussed chewing tobacco in regards to NPO status and aspiration risk with this patient. Given that there are no official guidelines from the ASA, and that NPO times for tobacco vary from 0-8 hours by practice, I informed him that while he is potentially at somewhat increased risk of aspiration, there is no concrete evidence that proceeding with the anesthetic at this time would lead to undue harm. Patient understands the potential risks and wishes to proceed Discussed the role of CRNA in patient's perioperative care. Patient understands.)       Anesthesia Quick Evaluation

## 2022-12-02 NOTE — Anesthesia Postprocedure Evaluation (Signed)
Anesthesia Post Note  Patient: Anthony Castillo  Procedure(s) Performed: CARDIOVERSION  Patient location during evaluation: Specials Recovery Anesthesia Type: General Level of consciousness: awake and alert Pain management: pain level controlled Vital Signs Assessment: post-procedure vital signs reviewed and stable Respiratory status: spontaneous breathing, nonlabored ventilation, respiratory function stable and patient connected to nasal cannula oxygen Cardiovascular status: blood pressure returned to baseline and stable Postop Assessment: no apparent nausea or vomiting Anesthetic complications: no   No notable events documented.   Last Vitals:  Vitals:   12/02/22 0830 12/02/22 0845  BP: (!) 151/84 134/81  Pulse: (!) 55 (!) 54  Resp: (!) 26 13  Temp:    SpO2: 99% 100%    Last Pain:  Vitals:   12/02/22 0845  TempSrc:   PainSc: 0-No pain                 Corinda Gubler

## 2022-12-03 ENCOUNTER — Encounter: Payer: Self-pay | Admitting: Cardiovascular Disease

## 2022-12-07 ENCOUNTER — Other Ambulatory Visit: Payer: Self-pay | Admitting: Pulmonary Disease

## 2022-12-07 DIAGNOSIS — G4733 Obstructive sleep apnea (adult) (pediatric): Secondary | ICD-10-CM

## 2022-12-20 ENCOUNTER — Other Ambulatory Visit: Payer: Self-pay | Admitting: Internal Medicine

## 2022-12-21 NOTE — Telephone Encounter (Signed)
Requested Prescriptions  Pending Prescriptions Disp Refills   pantoprazole (PROTONIX) 40 MG tablet [Pharmacy Med Name: PANTOPRAZOLE SODIUM 40MG  TABLET DR] 90 tablet 2    Sig: TAKE ONE TABLET BY MOUTH ONCE A DAY     Gastroenterology: Proton Pump Inhibitors Passed - 12/20/2022  8:56 AM      Passed - Valid encounter within last 12 months    Recent Outpatient Visits           3 weeks ago Type 2 diabetes mellitus with foot ulcer, without long-term current use of insulin [facility])   [place] [facility] [place], [place] W, NP   1 month ago Type 2 diabetes mellitus with foot ulcer, without long-term current use of insulin [facility])   [place] [facility] [place], Salvadore Oxford, NP   2 months ago Reactive depression   [place] [facility] [place], Salvadore Oxford, NP   4 months ago Encounter for general adult medical examination with abnormal findings   [facility] [place], Salvadore Oxford, NP   5 months ago Hearing loss due to cerumen impaction, left   [place] [facility] [place], Salvadore Oxford, NP       Future Appointments             In 2 weeks Brion Aliment, Dois [place], NP [facility] [place]   In 1 month Ba[place], Salvadore Oxford, NP [place] [facility] [place], [facility]

## 2022-12-22 ENCOUNTER — Other Ambulatory Visit: Payer: Self-pay | Admitting: Internal Medicine

## 2022-12-23 NOTE — Telephone Encounter (Signed)
Requested Prescriptions  Pending Prescriptions Disp Refills   glipiZIDE (GLUCOTROL) 10 MG tablet [Pharmacy Med Name: GLIPIZIDE 10MG  TABLET] 180 tablet 0    Sig: TAKE ONE TABLET BY MOUTH TWICE A DAY BEFORE A MEAL.     Endocrinology:  Diabetes - Sulfonylureas Failed - 12/22/2022  9:27 AM      Failed - HBA1C is between 0 and 7.9 and within 180 days    Hemoglobin A1C  Date Value Ref Range Status  11/04/2022 10.0 (A) 4.0 - 5.6 % Final   HbA1c POC (<> result, manual entry)  Date Value Ref Range Status  12/09/2021 10.3 4.0 - 5.6 % Final   Hgb A1c MFr Bld  Date Value Ref Range Status  08/04/2022 8.6 (H) <5.7 % of total Hgb Final    Comment:    For someone without known diabetes, a hemoglobin A1c value of 6.5% or greater indicates that they may have  diabetes and this should be confirmed with a follow-up  test. . For someone with known diabetes, a value <7% indicates  that their diabetes is well controlled and a value  greater than or equal to 7% indicates suboptimal  control. A1c targets should be individualized based on  duration of diabetes, age, comorbid conditions, and  other considerations. . Currently, no consensus exists regarding use of hemoglobin A1c for diagnosis of diabetes for children. .          Passed - Cr in normal range and within 360 days    Creat  Date Value Ref Range Status  08/04/2022 1.15 0.70 - 1.30 mg/dL Final   Creatinine, Ser  Date Value Ref Range Status  11/30/2022 1.23 0.76 - 1.27 mg/dL Final   Creatinine, Urine  Date Value Ref Range Status  08/04/2022 71 20 - 320 mg/dL Final         Passed - Valid encounter within last 6 months    Recent Outpatient Visits           3 weeks ago Type 2 diabetes mellitus with foot ulcer, without long-term current use of insulin [facility])   [facility] [place], [place] W, NP   1 month ago Type 2 diabetes mellitus with foot ulcer, without long-term current use of insulin [facility])   [facility] [place], Salvadore Oxford, NP   2 months ago Reactive depression   [place] [place], Salvadore Oxford, NP   4 months ago Encounter for general adult medical examination with abnormal findings   [facility] [place], Salvadore Oxford, NP   5 months ago Hearing loss due to cerumen impaction, left   [facility] [place], Salvadore Oxford, NP       Future Appointments             In 2 weeks Brion Aliment, Dois Davenport, NP [place] [facility] [place]   In 1 month Baity, Salvadore Oxford, NP [facility], [facility]

## 2022-12-28 ENCOUNTER — Other Ambulatory Visit: Payer: Self-pay | Admitting: Internal Medicine

## 2022-12-29 NOTE — Telephone Encounter (Signed)
Requested Prescriptions  Pending Prescriptions Disp Refills   FLUoxetine (PROZAC) 40 MG capsule [Pharmacy Med Name: FLUOXETINE HYDROCHLORIDE 40MG  CAPSULE] 90 capsule 0    Sig: TAKE ONE CAPSULE BY MOUTH ONCE DAILY     Psychiatry:  Antidepressants - SSRI Passed - 12/28/2022  9:25 AM      Passed - Completed PHQ-2 or PHQ-9 in the last 360 days      Passed - Valid encounter within last 6 months    Recent Outpatient Visits           4 weeks ago Type 2 diabetes mellitus with foot ulcer, without long-term current use of insulin (HCC)   [place] [facility] [place], [place] W, NP   1 month ago Type 2 diabetes mellitus with foot ulcer, without long-term current use of insulin [facility]   [facility] [place], [place], NP   2 months ago Reactive depression   [facility] [place], Salvadore Oxford, NP   4 months ago Encounter for general adult medical examination with abnormal findings   [place] [facility] [place], [place] W, NP   5 months ago Hearing loss due to cerumen impaction, left   [facility] [place], Salvadore Oxford, NP       Future Appointments             In 1 week Brion Aliment, Dois Davenport, NP [place] [facility] [place]   In 1 month Baity, Salvadore Oxford, NP [facility] [place]             tamsulosin (FLOMAX) 0.4 MG CAPS capsule [Pharmacy Med Name: TAMSULOSIN HYDROCHLORIDE 0.4MG  CAPSULE] 90 capsule 0    Sig: TAKE ONE CAPSULE BY MOUTH ONCE DAILY     Urology: Alpha-Adrenergic Blocker Passed - 12/28/2022  9:25 AM      Passed - PSA in normal range and within 360 days    PSA  Date Value Ref Range Status  08/04/2022 0.57 < OR = 4.00 ng/mL Final    Comment:    The total PSA value from this assay system is  standardized against the WHO standard. The test  result will be approximately 20% lower when compared  to the equimolar-standardized total PSA (Beckman   Coulter). Comparison of serial PSA results should be  interpreted with this fact in mind. . This test was performed using the Siemens  chemiluminescent method. Values obtained from  different assay methods cannot be used interchangeably. PSA levels, regardless of value, should not be interpreted as absolute evidence of the presence or absence of disease.          Passed - Last BP in normal range    BP Readings from Last 1 Encounters:  12/02/22 134/81         Passed - Valid encounter within last 12 months    Recent Outpatient Visits           4 weeks ago Type 2 diabetes mellitus with foot ulcer, without long-term current use of insulin [facility])   [place] [facility] [place], [place] W, NP   1 month ago Type 2 diabetes mellitus with foot ulcer, without long-term current use of insulin [facility])   [place] [place], Salvadore Oxford, NP   2 months ago Reactive depression   [facility] [place], Salvadore Oxford, NP   4 months ago  Encounter for general adult medical examination with abnormal findings   [facility] [place], [place] W, NP   5 months ago Hearing loss due to cerumen impaction, left   [place] [facility] [place], Salvadore Oxford, NP       Future Appointments             In 1 week Brion Aliment, Dois Davenport, NP [place] [facility] [place]   In 1 month Baity, Salvadore Oxford, NP [place], [facility]

## 2023-01-04 ENCOUNTER — Other Ambulatory Visit: Payer: Self-pay | Admitting: Cardiovascular Disease

## 2023-01-04 ENCOUNTER — Other Ambulatory Visit: Payer: Self-pay | Admitting: Internal Medicine

## 2023-01-05 NOTE — Telephone Encounter (Signed)
Request is too soon.  Requested Prescriptions  Pending Prescriptions Disp Refills   JANUVIA 100 MG tablet [Pharmacy Med Name: JANUVIA 100MG  TABLET] 90 tablet 0    Sig: TAKE ONE TABLET BY MOUTH ONCE A DAY     Endocrinology:  Diabetes - DPP-4 Inhibitors Failed - 01/04/2023 10:45 AM      Failed - HBA1C is between 0 and 7.9 and within 180 days    Hemoglobin A1C  Date Value Ref Range Status  11/04/2022 10.0 (A) 4.0 - 5.6 % Final   HbA1c POC (<> result, manual entry)  Date Value Ref Range Status  12/09/2021 10.3 4.0 - 5.6 % Final   Hgb A1c MFr Bld  Date Value Ref Range Status  08/04/2022 8.6 (H) <5.7 % of total Hgb Final    Comment:    For someone without known diabetes, a hemoglobin A1c value of 6.5% or greater indicates that they may have  diabetes and this should be confirmed with a follow-up  test. . For someone with known diabetes, a value <7% indicates  that their diabetes is well controlled and a value  greater than or equal to 7% indicates suboptimal  control. A1c targets should be individualized based on  duration of diabetes, age, comorbid conditions, and  other considerations. . Currently, no consensus exists regarding use of hemoglobin A1c for diagnosis of diabetes for children. .          Passed - Cr in normal range and within 360 days    Creat  Date Value Ref Range Status  08/04/2022 1.15 0.70 - 1.30 mg/dL Final   Creatinine, Ser  Date Value Ref Range Status  11/30/2022 1.23 0.76 - 1.27 mg/dL Final   Creatinine, Urine  Date Value Ref Range Status  08/04/2022 71 20 - 320 mg/dL Final         Passed - Valid encounter within last 6 months    Recent Outpatient Visits           1 month ago Type 2 diabetes mellitus with foot ulcer, without long-term current use of insulin [facility])   [facility] [place], [place] W, NP   2 months ago Type 2 diabetes mellitus with foot ulcer, without long-term current use of insulin [facility])   [place]  [facility] [place], Salvadore Oxford, NP   2 months ago Reactive depression   [place] [facility] [place], Salvadore Oxford, NP   5 months ago Encounter for general adult medical examination with abnormal findings   [place] [facility] [place], Salvadore Oxford, NP   6 months ago Hearing loss due to cerumen impaction, left   [place] [facility] [place], Salvadore Oxford, NP       Future Appointments             Tomorrow Brion Aliment, Dois Davenport, NP  [facility] [place]   In 1 month [place], Salvadore Oxford, NP  [facility], [facility]

## 2023-01-05 NOTE — Progress Notes (Unsigned)
Cardiology Office Note:    Date:  01/06/2023  ID:  Anthony Castillo, DOB 02-09-1966, MRN [id number] PCP: Lorre Munroe, NP  [facility] Providers Cardiologist:  Julien Nordmann, MD       Patient Profile:      Hyperlipidemia Hypertension Persistent atrial fibrillation Successful cardioversion 12/02/22 Chronic shortness of breath Coronary artery disease Coronary calcium 103 (04/11/18) OSA Not on CPAP Tobacco use Obesity      History of Present Illness:   Anthony Castillo is a 57 y.o. male who returns for follow-up atrial fibrillation and post cardioversion.  He was seen in clinic on 09/30/2022 and found to be in new onset atrial fibrillation with RVR, heart rate 117 bpm.  He appreciated cough, lower extremity edema, fatigue.  Metoprolol succinate 50 mg, diltiazem ER 180 mg, Eliquis 5 mg twice daily, Lasix 20 mg daily were added to his medication regimen at this time.  His amlodipine, lisinopril-HCTZ, aspirin were discontinued.  Last seen in office on 11/30/2022 by Dr. Mariah Milling, he presented in atrial fibrillation with rate of 67 bpm.  He had noted 6 months of shortness of breath, coughing, leg swelling.  He was scheduled for cardioversion.  There was concerns of poorly controlled sleep apnea contributing to recurrent A-fib, reported difficulty tolerating CPAP and had stopped it years ago. 12/02/2022 he was cardioverted at 150 J, 200 J, 200 J with chest compression, cardioverted at 200 J with pads anterolateral, 200 J anterolateral pads with manual compression and converted to NSR on shock 5.  Today:  His EKG revealed that he is back in atrial fibrillation with heart rate 75 bpm.  He continues to feel fatigued with mild shortness of breath.  He does not feel when he goes into atrial fibrillation.  He noted his symptoms did not change post cardioversion.  Leg swelling has improved after addition of Lasix.  He denies palpitations, tachycardia.  He denies any exertional chest pain.  He was  seen by pulmonology and plans to complete home sleep study when the equipment arrives.  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          Review of Systems  Constitutional: Negative for weight gain and weight loss.  Cardiovascular:  Positive for dyspnea on exertion and leg swelling. Negative for chest pain, claudication, cyanosis, irregular heartbeat, near-syncope, orthopnea, palpitations, paroxysmal nocturnal dyspnea and syncope.  Respiratory:  Positive for shortness of breath. Negative for cough and hemoptysis.   Gastrointestinal:  Negative for abdominal pain, hematochezia and melena.  Genitourinary:  Negative for hematuria.  Neurological:  Negative for dizziness and light-headedness.     See HPI     Studies Reviewed:   EKG Interpretation Date/Time:  Thursday January 06 2023 09:24:09 EST Ventricular Rate:  75 PR Interval:    QRS Duration:  96 QT Interval:  410 QTC Calculation: 457 R Axis:   29  Text Interpretation: Atrial fibrillation Confirmed by Rise Paganini [phone]) on 01/06/2023 10:19:24 AM    Echo 11/03/22  1. Left ventricular ejection fraction, by estimation, is 55 to 60%. The  left ventricle has normal function. The left ventricle has no regional  wall motion abnormalities. Left ventricular diastolic parameters are  consistent with Grade I diastolic  dysfunction (impaired relaxation).   2. Right ventricular systolic function is normal. The right ventricular  size is normal. Tricuspid regurgitation signal is inadequate for assessing  PA pressure.   3. Left atrial size was mildly dilated.  4. The mitral valve is normal in structure. No evidence of mitral valve  regurgitation. No evidence of mitral stenosis.   5. The aortic valve has an indeterminant number of cusps. Aortic valve  regurgitation is mild. No aortic stenosis is present.   6. There is borderline dilatation of the ascending aorta, measuring 39  mm.   7. The inferior vena cava  is normal in size with greater than 50%  respiratory variability, suggesting right atrial pressure of 3 mmHg.   CT coronary calcium 04/11/2018 1. Coronary calcium score of 103. This was 61 percentile for age and sex matched control.   2. Mildly dilated pulmonary artery measuring 32 mm suggestive of pulmonary hypertension. Risk Assessment/Calculations:    CHA2DS2-VASc Score = 3   This indicates a 3.2% annual risk of stroke. The patient's score is based upon: CHF History: 0 HTN History: 1 Diabetes History: 1 Stroke History: 0 Vascular Disease History: 1 Age Score: 0 Gender Score: 0            Physical Exam:   VS:  BP 130/72 (BP Location: Left Arm, Patient Position: Sitting, Cuff Size: Normal)   Pulse 75   Ht 6' (1.829 m)   Wt (!) 343 lb 3.2 oz (155.7 kg)   SpO2 96%   BMI 46.55 kg/m    Wt Readings from Last 3 Encounters:  01/06/23 (!) 343 lb 3.2 oz (155.7 kg)  12/02/22 (!) 350 lb (158.8 kg)  11/30/22 (!) 352 lb (159.7 kg)    Constitutional:      Appearance: Normal and healthy appearance.  Neck:     Vascular: JVD normal.  Pulmonary:     Effort: Pulmonary effort is normal.     Breath sounds: Normal breath sounds.  Chest:     Chest wall: Not tender to palpatation.  Cardiovascular:     PMI at left midclavicular line. Normal rate. Irregularly irregular rhythm. Normal S1. Normal S2.      Murmurs: There is no murmur.     No gallop.  No click. No rub.  Pulses:    Intact distal pulses.  Edema:    Pretibial: bilateral pitting edema of the pretibial area.    Ankle: bilateral 1+ pitting edema of the ankle.    Feet: bilateral 1+ edema of the feet. Musculoskeletal: Normal range of motion.     Cervical back: Normal range of motion and neck supple. Skin:    General: Skin is warm and dry.  Neurological:     General: No focal deficit present.     Mental Status: Alert and oriented to person, place and time.  Psychiatric:        Behavior: Behavior is cooperative.        Assessment and Plan:  Persistent atrial fibrillation -CHA2DS2-VASc Score = 3 [CHF History: 0, HTN History: 1, Diabetes History: 1, Stroke History: 0, Vascular Disease History: 1, Age Score: 0, Gender Score: 0].  Therefore, the patient's annual risk of stroke is 3.2 %.   -Cardioversion 12/02/2022, 5 shocks to restore NSR, EKG today atrial fibrillation and rate controlled at 75 bpm -Given BMI of 46.55 and poorly controlled OSA theres high risk for recurrent AF if cardioversion is attempted again.  Not an ablation candidate at this time. -Referral to EP for consideration of antiarrhythmics  -Continue diltiazem 180 mg daily, metoprolol succinate 50 mg -Continue apixaban 5 mg twice daily, does not meet dose reduction requirements  HFpEF -Echo 11/03/2022 LVEF 50-60%, grade 1 diastolic dysfunction -Euvolemic well compensated  on exam -SOB likely related to general deconditioning, morbid obesity, persistent atrial fibrillation -Continue Lasix 20 mg daily -Less than 2 g sodium restriction  Obstructive sleep apnea -Not on CPAP, did not tolerate facemask, quit several years ago -Follows with Dr. Vassie Loll, plans to complete home sleep study when equipment arrives - OSA is associated with a two-fold risk of recurrent AF after cardioversion  Type 2 diabetes, uncontrolled -11/04/22 A1c 10.0, follows with PCP -Recently started on Trulicty -Low-carb diet, daily physical exercise  Coronary artery calcification -04/12/2019 CT coronary calcium score of 103 -Stable with no anginal symptoms, no indication for ischemic evaluation  -Not on ASA due to [facility] -Continue ezetimibe 10 mg, rosuvastatin 10 mg  Hypertension -BP today 130/72 -Continue Zestoretic 20-12.5mg , lisinopril 2 mg                 Dispo:  Follow-up with EP APP within the next two weeks.   Signed, Denyce Robert, NP

## 2023-01-06 ENCOUNTER — Telehealth (HOSPITAL_BASED_OUTPATIENT_CLINIC_OR_DEPARTMENT_OTHER): Payer: Self-pay | Admitting: Pulmonary Disease

## 2023-01-06 ENCOUNTER — Encounter: Payer: Self-pay | Admitting: Nurse Practitioner

## 2023-01-06 ENCOUNTER — Other Ambulatory Visit: Payer: Self-pay | Admitting: Internal Medicine

## 2023-01-06 ENCOUNTER — Ambulatory Visit: Payer: 59 | Attending: Nurse Practitioner | Admitting: Emergency Medicine

## 2023-01-06 VITALS — BP 130/72 | HR 75 | Ht 72.0 in | Wt 343.2 lb

## 2023-01-06 DIAGNOSIS — I5032 Chronic diastolic (congestive) heart failure: Secondary | ICD-10-CM | POA: Diagnosis not present

## 2023-01-06 DIAGNOSIS — I2510 Atherosclerotic heart disease of native coronary artery without angina pectoris: Secondary | ICD-10-CM

## 2023-01-06 DIAGNOSIS — I10 Essential (primary) hypertension: Secondary | ICD-10-CM | POA: Diagnosis not present

## 2023-01-06 DIAGNOSIS — E1165 Type 2 diabetes mellitus with hyperglycemia: Secondary | ICD-10-CM

## 2023-01-06 DIAGNOSIS — I4819 Other persistent atrial fibrillation: Secondary | ICD-10-CM | POA: Diagnosis not present

## 2023-01-06 DIAGNOSIS — G4733 Obstructive sleep apnea (adult) (pediatric): Secondary | ICD-10-CM

## 2023-01-06 NOTE — Patient Instructions (Signed)
Medication Instructions:  Your Physician recommend you continue on your current medication as directed.    *If you need a refill on your cardiac medications before your next appointment, please call your pharmacy*   Lab Work: None ordered.  If you have labs (blood work) drawn today and your tests are completely normal, you will receive your results only by: MyChart Message (if you have MyChart) OR A paper copy in the mail If you have any lab test that is abnormal or we need to change your treatment, we will call you to review the results.   Testing/Procedures: None ordered.    Follow-Up: At [facility], you and your health needs are our priority.  As part of our continuing mission to provide you with exceptional heart care, we have created designated Provider Care Teams.  These Care Teams include your primary Cardiologist (physician) and Advanced Practice Providers (APPs -  Physician Assistants and Nurse Practitioners) who all work together to provide you with the care you need, when you need it.  We recommend signing up for the patient portal called "MyChart".  Sign up information is provided on this After Visit Summary.  MyChart is used to connect with patients for Virtual Visits (Telemedicine).  Patients are able to view lab/test results, encounter notes, upcoming appointments, etc.  Non-urgent messages can be sent to your provider as well.   To learn more about what you can do with MyChart, go to [email or web address].    Your next appointment:   Referral for Electrophysiology   Provider:   Sherie Don, NP

## 2023-01-06 NOTE — Telephone Encounter (Signed)
Requested medications are due for refill today.  yes  Requested medications are on the active medications list.  yes  Last refill. 09/29/2022 #90 0 rf  Future visit scheduled.  yes   Notes to clinic.  Please review for refill    Requested Prescriptions  Pending Prescriptions Disp Refills   JANUVIA 100 MG tablet [Pharmacy Med Name: JANUVIA 100MG  TABLET] 90 tablet 0    Sig: TAKE ONE TABLET BY MOUTH ONCE A DAY     Endocrinology:  Diabetes - DPP-4 Inhibitors Failed - 01/06/2023  1:25 PM      Failed - HBA1C is between 0 and 7.9 and within 180 days    Hemoglobin A1C  Date Value Ref Range Status  11/04/2022 10.0 (A) 4.0 - 5.6 % Final   HbA1c POC (<> result, manual entry)  Date Value Ref Range Status  12/09/2021 10.3 4.0 - 5.6 % Final   Hgb A1c MFr Bld  Date Value Ref Range Status  08/04/2022 8.6 (H) <5.7 % of total Hgb Final    Comment:    For someone without known diabetes, a hemoglobin A1c value of 6.5% or greater indicates that they may have  diabetes and this should be confirmed with a follow-up  test. . For someone with known diabetes, a value <7% indicates  that their diabetes is well controlled and a value  greater than or equal to 7% indicates suboptimal  control. A1c targets should be individualized based on  duration of diabetes, age, comorbid conditions, and  other considerations. . Currently, no consensus exists regarding use of hemoglobin A1c for diagnosis of diabetes for children. .          Passed - Cr in normal range and within 360 days    Creat  Date Value Ref Range Status  08/04/2022 1.15 0.70 - 1.30 mg/dL Final   Creatinine, Ser  Date Value Ref Range Status  11/30/2022 1.23 0.76 - 1.27 mg/dL Final   Creatinine, Urine  Date Value Ref Range Status  08/04/2022 71 20 - 320 mg/dL Final         Passed - Valid encounter within last 6 months    Recent Outpatient Visits           1 month ago Type 2 diabetes mellitus with foot ulcer, without  long-term current use of insulin [facility])   [place] [facility] [place], [place] W, NP   2 months ago Type 2 diabetes mellitus with foot ulcer, without long-term current use of insulin [facility])   [place] [facility] [place], Salvadore Oxford, NP   2 months ago Reactive depression   [place] [facility] [place], Salvadore Oxford, NP   5 months ago Encounter for general adult medical examination with abnormal findings   [place] [facility] [place], Salvadore Oxford, NP   6 months ago Hearing loss due to cerumen impaction, left   [place] [facility] [place], Salvadore Oxford, NP       Future Appointments             In 1 month [place], Salvadore Oxford, NP [facility]

## 2023-01-06 NOTE — Telephone Encounter (Signed)
Anthony Castillo can this one be scheduled out there since he lives in [place]

## 2023-01-06 NOTE — Telephone Encounter (Signed)
Medication Refill -  Most Recent Primary Care Visit:  Provider: Lorre Munroe  Department: [facility]  Visit Type: OFFICE VISIT  Date: 11/30/2022  Medication: JANUVIA 100 MG tablet   Has the patient contacted their pharmacy? Yes No refills and provider rejected the order  Is this the correct pharmacy for this prescription? Yes  This is the patient's preferred pharmacy:  [facility] [place] [place] - [address] [place] [address] Phone: [phone] Fax: [phone]   Has the prescription been filled recently? No  Is the patient out of the medication? Yes  Has the patient been seen for an appointment in the last year OR does the patient have an upcoming appointment? Yes  Can we respond through MyChart? Yes  Agent: Please be advised that Rx refills may take up to 3 business days. We ask that you follow-up with your pharmacy.  Spouse clld sttd patient is going out of town in the morning and will be gone for a week

## 2023-01-07 MED ORDER — SITAGLIPTIN PHOSPHATE 100 MG PO TABS: 100.0000 mg | ORAL_TABLET | Freq: Every day | ORAL | 0 refills | Status: DC

## 2023-01-07 NOTE — Progress Notes (Signed)
(  Key: [id number])  form thumbnail Your information has been submitted to [employer]. To check for an updated outcome later, reopen this PA request from your dashboard.  If [employer] has not responded to your request within 24 hours, contact [employer] at [phone]. If you think there may be a problem with your PA request, use our live chat feature at the bottom right.

## 2023-01-07 NOTE — Telephone Encounter (Signed)
Requested Prescriptions  Pending Prescriptions Disp Refills   sitaGLIPtin (JANUVIA) 100 MG tablet 90 tablet 0    Sig: Take 1 tablet (100 mg total) by mouth daily.     Endocrinology:  Diabetes - DPP-4 Inhibitors Failed - 01/06/2023  2:04 PM      Failed - HBA1C is between 0 and 7.9 and within 180 days    Hemoglobin A1C  Date Value Ref Range Status  11/04/2022 10.0 (A) 4.0 - 5.6 % Final   HbA1c POC (<> result, manual entry)  Date Value Ref Range Status  12/09/2021 10.3 4.0 - 5.6 % Final   Hgb A1c MFr Bld  Date Value Ref Range Status  08/04/2022 8.6 (H) <5.7 % of total Hgb Final    Comment:    For someone without known diabetes, a hemoglobin A1c value of 6.5% or greater indicates that they may have  diabetes and this should be confirmed with a follow-up  test. . For someone with known diabetes, a value <7% indicates  that their diabetes is well controlled and a value  greater than or equal to 7% indicates suboptimal  control. A1c targets should be individualized based on  duration of diabetes, age, comorbid conditions, and  other considerations. . Currently, no consensus exists regarding use of hemoglobin A1c for diagnosis of diabetes for children. .          Passed - Cr in normal range and within 360 days    Creat  Date Value Ref Range Status  08/04/2022 1.15 0.70 - 1.30 mg/dL Final   Creatinine, Ser  Date Value Ref Range Status  11/30/2022 1.23 0.76 - 1.27 mg/dL Final   Creatinine, Urine  Date Value Ref Range Status  08/04/2022 71 20 - 320 mg/dL Final         Passed - Valid encounter within last 6 months    Recent Outpatient Visits           1 month ago Type 2 diabetes mellitus with foot ulcer, without long-term current use of insulin [facility])   [place] [facility] [place], [place] W, NP   2 months ago Type 2 diabetes mellitus with foot ulcer, without long-term current use of insulin [facility])   [place] [facility] [place], Salvadore Oxford,  NP   2 months ago Reactive depression   [place] [facility] [place], Salvadore Oxford, NP   5 months ago Encounter for general adult medical examination with abnormal findings   [place] [facility] [place], Salvadore Oxford, NP   6 months ago Hearing loss due to cerumen impaction, left   [place] [facility] [place], Salvadore Oxford, NP       Future Appointments             In 1 month [place], Salvadore Oxford, NP [place] [facility], [facility]

## 2023-01-07 NOTE — Progress Notes (Signed)
(  Key: [id number])  form thumbnail Your demographic data has been sent to [employer] successfully!  [employer] typically takes 5-10 minutes to respond, but it may take a little longer in some cases. You will be notified by email when available. You can also check for an update later by opening this request from your dashboard. Please do not fax or call [employer] to resubmit this request. If you need assistance, please chat with CoverMyMeds or call us at [phone].  If it has been longer than 24 hours, please reach out to [employer].

## 2023-01-10 NOTE — Progress Notes (Signed)
Approved on November 8 by [facility] NCPDP 2017 Your PA request has been approved. Additional information will be provided in the approval communication. (Message 1145) Authorization Expiration Date: 01/06/2024

## 2023-01-17 NOTE — Progress Notes (Unsigned)
Cardiology Office Note Date:  01/18/2023  Patient ID:  Anthony, Castillo 05/14/1965, MRN [id number] PCP:  Lorre Munroe, NP  Cardiologist:  Julien Nordmann, MD Electrophysiologist: None     Chief Complaint: afib  History of Present Illness: Anthony Castillo is a 57 y.o. male with PMH notable for afib, non-obs CAD, OSA, HTN, T2DM, HFpEF; seen today for  electrophysiology evaluation of AFib.  He was initially diagnosed with AFib 09/2022 with symptoms of cough, edema and fatigue. He was started on metop, dilt and eliquis at that visit. On follow-up 11/2022 remained in rate-controlled AFib. He is s/p successful DCCV 10/3 but converted back to AFib prior to follow-up 11/7. He was referred back to pulm to discuss OSA treatment.   On follow-up today, he has continued fatigue. He denies palpitations, chest pain, chest pressure. He did notice his fatigue was improved right after the DCCV but has returned.  He is in process of getting back in with pulm provider. Has called and messaged several times without response.  Diligently takes eliquis BID, no missed doses, no bleeding concerns.  His PCP recently started him on trulicity, he has noticed a decrease appetite. No abd pain, nausea, diarrhea.   He works as Visual merchandiser, Systems analyst. Active walking around fields, but does not formally exercise.  Planning to go out of town in December to go bear hunting  Wife joins for visit.    AAD History: None   Past Medical History:  Diagnosis Date   Chewing tobacco dependence    Chronic pain 04/09/2011   Coronary artery calcification seen on CT scan    a. 04/2018 Cardiac CT: Ca2+ = 103 (91st %'ile).   DEPRESSION 03/16/2007   Diastolic dysfunction    a. 10/2022 Echo: EF 55-60%, no rwma, GrI DD, nl RV size/fxn, mildly dil LA, mild AI. Asc Ao 39mm.   GERD (gastroesophageal reflux disease)    Hypogonadism male 04/09/2011   Mixed hyperlipidemia    Morbid obesity (HCC) 09/22/2006   Osteoarthritis  04/09/2011   Persistent atrial fibrillation (HCC)    a. 11/2022 s/p DCCV 150J->200J->200J->200J A/P->200J A/P--> RSR; b. CHA2DS2VASc = 2 (HTN/Cor Ca2+).   Primary hypertension    Sleep apnea    a. Not using CPAP.   Wears glasses     Past Surgical History:  Procedure Laterality Date   CARDIOVERSION N/A 12/02/2022   Procedure: CARDIOVERSION;  Surgeon: Antonieta Iba, MD;  Location: [facility];  Service: Cardiovascular;  Laterality: N/A;   CARPAL TUNNEL RELEASE Right 05/17/2013   Procedure: RIGHT CARPAL TUNNEL RELEASE;  Surgeon: Wyn Forster., MD;  Location: [facility];  Service: Orthopedics;  Laterality: Right;   CARPAL TUNNEL RELEASE Left 07/19/2013   Procedure: LEFT CARPAL TUNNEL RELEASE;  Surgeon: Wyn Forster., MD;  Location: [facility];  Service: Orthopedics;  Laterality: Left;   FOOT SURGERY Right 01/2015   KNEE ARTHROSCOPY  6/14   right   TONSILLECTOMY     ULNAR NERVE TRANSPOSITION Right 05/17/2013   Procedure: RIGHT ULNAR NERVE DECOMPRESSION;  Surgeon: Wyn Forster., MD;  Location: [facility];  Service: Orthopedics;  Laterality: Right;   WISDOM TOOTH EXTRACTION      Current Outpatient Medications  Medication Instructions   Acetaminophen (TYLENOL ARTHRITIS PAIN PO) Oral, As needed   albuterol (VENTOLIN HFA) 108 (90 Base) MCG/ACT inhaler 2 puffs, Inhalation, Every 6 hours PRN   apixaban (ELIQUIS) 5 mg, Oral, 2 times  daily   augmented betamethasone dipropionate (DIPROLENE-AF) 0.05 % ointment Topical   buPROPion (WELLBUTRIN SR) 150 mg, Oral, Daily   celecoxib (CELEBREX) 100 mg, Oral, 2 times daily   cetirizine (ZYRTEC) 10 mg, Oral, Daily   Cholecalciferol (VITAMIN D3) 1000 units CAPS 1 capsule, Oral, Daily   diltiazem (CARDIZEM CD) 180 mg, Oral, Daily   empagliflozin (JARDIANCE) 25 mg, Oral, Daily before breakfast   ezetimibe (ZETIA) 10 mg, Oral, Daily   flecainide (TAMBOCOR) 100 mg, Oral, 2 times daily    fluocinonide cream (LIDEX) 0.05 % As needed   FLUoxetine (PROZAC) 40 mg, Oral, Daily   furosemide (LASIX) 20 mg, Oral, Daily   glipiZIDE (GLUCOTROL) 10 MG tablet TAKE ONE TABLET BY MOUTH TWICE A DAY BEFORE A MEAL.   Januvia 100 mg, Oral, Daily   lamoTRIgine (LAMICTAL) 100 mg, Oral, Every morning   lisinopril (ZESTRIL) 10 mg, Oral, Daily   lisinopril-hydrochlorothiazide (ZESTORETIC) 20-12.5 MG tablet 1 tablet, Oral, 2 times daily   metoprolol succinate (TOPROL-XL) 50 mg, Oral, Daily, Take with or immediately following a meal.   nystatin-triamcinolone ointment (MYCOLOG) 1 Application, Topical, 2 times daily   pantoprazole (PROTONIX) 40 mg, Oral, Daily   potassium chloride (KLOR-CON) 10 MEQ tablet TAKE ONE TABLET BY MOUTH ONCE A DAY   rosuvastatin (CRESTOR) 10 mg, Oral, Daily   sitaGLIPtin (JANUVIA) 100 mg, Oral, Daily   tamsulosin (FLOMAX) 0.4 mg, Oral, Daily   Trulicity 0.75 mg, Subcutaneous, Weekly    Social History:  The patient  reports that he has never smoked. He has never been exposed to tobacco smoke. His smokeless tobacco use includes chew. He reports current alcohol use. He reports that he does not use drugs.   Family History:  The patient's family history includes Arthritis in his paternal grandmother; Diabetes in his maternal grandmother and sister; Heart Problems in his son; Heart disease in his father and paternal grandfather; Hypertension in his father; Irritable bowel syndrome in his daughter; Lymphoma in his mother; Migraines in his daughter; Stroke in his paternal uncle.  ROS:  Please see the history of present illness. All other systems are reviewed and otherwise negative.   PHYSICAL EXAM:  VS:  BP 126/84 (BP Location: Left Arm, Patient Position: Sitting, Cuff Size: Large)   Pulse 78   Ht 6' (1.829 m)   Wt (!) 339 lb 6 oz (153.9 kg)   SpO2 98%   BMI 46.03 kg/m  BMI: Body mass index is 46.03 kg/m.  GEN- The patient is well appearing, alert and oriented x 3 today.    Lungs- Clear to ausculation bilaterally, normal work of breathing.  Heart- Irregularly irregular rate and rhythm, no murmurs, rubs or gallops Extremities- Trace peripheral edema, warm, dry   EKG is ordered. Personal review of EKG from today shows:    EKG Interpretation Date/Time:  Tuesday January 18 2023 09:18:07 EST Ventricular Rate:  78 PR Interval:    QRS Duration:  98 QT Interval:  416 QTC Calculation: 474 R Axis:   3  Text Interpretation: Atrial fibrillation Confirmed by Sherie Don [phone]) on 01/18/2023 9:23:11 AM    Recent Labs: 08/04/2022: ALT 12 11/30/2022: BUN 14; Creatinine, Ser 1.23; Hemoglobin 15.0; Platelets 217; Potassium 3.9; Sodium 136  08/04/2022: Cholesterol 79; HDL 25; LDL Cholesterol (Calc) 21; Total CHOL/HDL Ratio 3.2; Triglycerides 284   CrCl cannot be calculated (Patient's most recent lab result is older than the maximum 21 days allowed.).   Wt Readings from Last 3 Encounters:  01/18/23 (!) 339  lb 6 oz (153.9 kg)  01/06/23 (!) 343 lb 3.2 oz (155.7 kg)  12/02/22 (!) 350 lb (158.8 kg)     Additional studies reviewed include: Previous EP, cardiology notes.   TTE, 11/03/2022  1. Left ventricular ejection fraction, by estimation, is 55 to 60%. The left ventricle has normal function. The left ventricle has no regional wall motion abnormalities. Left ventricular diastolic parameters are consistent with Grade I diastolic dysfunction (impaired relaxation).   2. Right ventricular systolic function is normal. The right ventricular size is normal. Tricuspid regurgitation signal is inadequate for assessing PA pressure.   3. Left atrial size was mildly dilated.   4. The mitral valve is normal in structure. No evidence of mitral valve regurgitation. No evidence of mitral stenosis.   5. The aortic valve has an indeterminant number of cusps. Aortic valve regurgitation is mild. No aortic stenosis is present.   6. There is borderline dilatation of the ascending aorta,  measuring 39 mm.   7. The inferior vena cava is normal in size with greater than 50% respiratory variability, suggesting right atrial pressure of 3 mmHg.   CT cardiac score, 04/11/2018 1. Coronary calcium score of 103. This was 28 percentile for age and sex matched control.  2. Mildly dilated pulmonary artery measuring 32 mm suggestive of pulmonary hypertension.         ASSESSMENT AND PLAN:  #) persis afib Ventricular rates well-controlled Continue 180mg  dilt and 50mg  toprol daily Discussed AAD vs procedural treatments for AFib Do not favor invasive procedure at this time AAD options discussed, will start flecainide 100mg  BID ETT in 7-10 days Update thyroid labs today Encouraged lifestyle modifications including 30 minutes of aerobic activity daily  #) Hypercoag d/t persis afib CHA2DS2-VASc Score = 3 [CHF History: 0, HTN History: 1, Diabetes History: 1, Stroke History: 0, Vascular Disease History: 1, Age Score: 0, Gender Score: 0].  Therefore, the patient's annual risk of stroke is 3.2 %.    Stroke ppx - 5mg  eliquis BID, appropriately dosed No bleeding concerns  #) HTN Well-controlled in office today  #) OSA Previously diagnosed with OSA but intolerant of CPAP mask Will reach out to Dr. Vassie Loll on patient's behalf to assist with OSA eval Patient is agreeable to tx care to [facility] pulm team, if needed   Informed Consent   Shared Decision Making/Informed Consent The risks [chest pain, shortness of breath, cardiac arrhythmias, dizziness, blood pressure fluctuations, myocardial infarction, stroke/transient ischemic attack, and life-threatening complications (estimated to be 1 in 10,000)], benefits (risk stratification, diagnosing coronary artery disease, treatment guidance) and alternatives of an exercise tolerance test were discussed in detail with Mr. Avanessian and he agrees to proceed.      Current medicines are reviewed at length with the patient today.   The patient does not have  concerns regarding his medicines.  The following changes were made today:   START flecainide 100mg  BID  Labs/ tests ordered today include:  Orders Placed This Encounter  Procedures   T4, free   TSH   EXERCISE TOLERANCE TEST (ETT)   EKG 12-Lead     Disposition: Follow up with EP APP  in 4 weeks   Signed, Sherie Don, NP  01/18/23  10:11 AM  Electrophysiology [facility]

## 2023-01-18 ENCOUNTER — Encounter: Payer: Self-pay | Admitting: Cardiology

## 2023-01-18 ENCOUNTER — Other Ambulatory Visit: Payer: Self-pay | Admitting: Cardiovascular Disease

## 2023-01-18 ENCOUNTER — Ambulatory Visit: Payer: 59 | Attending: Cardiology | Admitting: Cardiology

## 2023-01-18 ENCOUNTER — Other Ambulatory Visit (HOSPITAL_BASED_OUTPATIENT_CLINIC_OR_DEPARTMENT_OTHER): Payer: Self-pay

## 2023-01-18 VITALS — BP 126/84 | HR 78 | Ht 72.0 in | Wt 339.4 lb

## 2023-01-18 DIAGNOSIS — I10 Essential (primary) hypertension: Secondary | ICD-10-CM | POA: Diagnosis not present

## 2023-01-18 DIAGNOSIS — I4819 Other persistent atrial fibrillation: Secondary | ICD-10-CM | POA: Diagnosis not present

## 2023-01-18 DIAGNOSIS — G4733 Obstructive sleep apnea (adult) (pediatric): Secondary | ICD-10-CM | POA: Diagnosis not present

## 2023-01-18 DIAGNOSIS — D6869 Other thrombophilia: Secondary | ICD-10-CM

## 2023-01-18 MED ORDER — FLECAINIDE ACETATE 100 MG PO TABS: 100.0000 mg | ORAL_TABLET | Freq: Two times a day (BID) | ORAL | 1 refills | Status: DC

## 2023-01-18 NOTE — Telephone Encounter (Signed)
Called and spoke w/ pt 's wife to let her know that someone will be call him to set him up for home sleep .

## 2023-01-18 NOTE — Telephone Encounter (Signed)
-----   Message from Nurse Raynelle Fanning T sent at 01/18/2023  9:38 AM EST ----- Regarding: HST scheduling Good morning,  Suzann seen this patient this morning, and has not yet heard about getting his sleep study scheduled yet. Just wanted to reach out and see if we can get this set up, patient and wife are wanting to go ahead and get this completed as soon as possible.   Thank you!

## 2023-01-18 NOTE — Patient Instructions (Addendum)
Medication Instructions:  START Flecainide 100 mg twice daily   *If you need a refill on your cardiac medications before your next appointment, please call your pharmacy*   Lab Work: Your provider would like for you to have following labs drawn today TSH, Free T4.   If you have labs (blood work) drawn today and your tests are completely normal, you will receive your results only by: MyChart Message (if you have MyChart) OR A paper copy in the mail If you have any lab test that is abnormal or we need to change your treatment, we will call you to review the results.   Testing/Procedures:  **FLECAINIDE GXT** (in 7-10 days)  Your provider has ordered a exercise tolerance test. This test will evaluate the blood supply to your heart muscle during periods of exercise and rest. For this test, you will raise your heart rate by walking on a treadmill at different levels.   you may eat a light breakfast/ lunch prior to your procedure no caffeine for 24 hours prior to your test (coffee, tea, soft drinks, or chocolate)  no smoking/ vaping for 4 hours prior to your test you may take your regular medications the day of your test - ** DO NOT hold beta blockers or calcium channel blockers** bring any inhalers with you to your test wear comfortable clothing & tennis/ non-skid shoes to walk on the treadmill  This will take place at [address], [place] [address]       Follow-Up: At [facility], you and your health needs are our priority.  As part of our continuing mission to provide you with exceptional heart care, we have created designated Provider Care Teams.  These Care Teams include your primary Cardiologist (physician) and Advanced Practice Providers (APPs -  Physician Assistants and Nurse Practitioners) who all work together to provide you with the care you need, when you need it.  We recommend signing up for the patient portal called "MyChart".   Sign up information is provided on this After Visit Summary.  MyChart is used to connect with patients for Virtual Visits (Telemedicine).  Patients are able to view lab/test results, encounter notes, upcoming appointments, etc.  Non-urgent messages can be sent to your provider as well.   To learn more about what you can do with MyChart, go to [email or web address].    Your next appointment:   2 weeks AFTER GXT with Sherie Don, NP

## 2023-01-18 NOTE — Telephone Encounter (Signed)
Can we please schedule this patient's home sleep test. If needed, please let him know the reason for the delay

## 2023-01-19 ENCOUNTER — Telehealth: Payer: Self-pay | Admitting: Cardiology

## 2023-01-19 LAB — T4, FREE: Free T4: 0.99 ng/dL (ref 0.82–1.77)

## 2023-01-19 LAB — TSH: TSH: 1.8 u[IU]/mL (ref 0.450–4.500)

## 2023-01-19 NOTE — Telephone Encounter (Signed)
I called patient to discuss AAD medications after speaking with Dr. Lalla Brothers.   Do not favor flecainide given his elevated Coronary Calcium score.  Only options would be tikosyn vs amiodarone, do not prefer amio given young age.   I discussed this recommendation with patient and wife, who both verbalized understanding. He will consider tikosyn admission after new year.  Msg sent to AFib clinic RN to start process.   Follow-up appt scheduled with me for 03/2023

## 2023-01-19 NOTE — Addendum Note (Signed)
Addended by: Sherie Don on: 01/19/2023 09:19 AM   Modules accepted: Orders

## 2023-01-20 ENCOUNTER — Other Ambulatory Visit (HOSPITAL_COMMUNITY): Payer: Self-pay

## 2023-01-21 ENCOUNTER — Encounter (HOSPITAL_COMMUNITY): Payer: Self-pay

## 2023-01-24 ENCOUNTER — Telehealth: Payer: Self-pay

## 2023-01-24 ENCOUNTER — Telehealth (HOSPITAL_COMMUNITY): Payer: Self-pay | Admitting: *Deleted

## 2023-01-24 ENCOUNTER — Other Ambulatory Visit (HOSPITAL_COMMUNITY): Payer: Self-pay

## 2023-01-24 NOTE — Telephone Encounter (Addendum)
Medication list reviewed in anticipation of upcoming Tikosyn initiation. Patient is taking contraindicated or QTc prolonging medications.   Patient is anticoagulated on apixaban on the appropriate dose. Please ensure that patient has not missed any anticoagulation doses in the 3 weeks prior to Tikosyn initiation.   Patient will need to be counseled to avoid use of Benadryl while on Tikosyn and in the 2-3 days prior to Tikosyn initiation.   Lamotrigine: Contraindicated due to increased risk of increasing concentration of Tikosyn. Will need to find alternative.  Furosemide: May enhance QTc-prolongation effect of Tikosyn and may increase serum concentrations of Tikosyn. Monitor serum potassium and magnesium closely  Diltiazem: Monitor for QTc-prolongation Fluoxetine: Recommend switching to SNRI such as duloxetine. If patient is stable and prescriber provider does not wish to change, close monitoring will be needed.  Lisinopril/hydrochlorothiazide (entered as a historic med) and lisinopril are both on medication list. I do not believe patient is taking lisinopril/hydrochlorothiazide. It has not been filled since August. Lisinopril was filled in Oct. Please confirm with patient that he is NOT taking lisinopril/hydrochlorothiazide as he cannot take hydrochlorothiazide with Tikosyn.

## 2023-01-24 NOTE — Telephone Encounter (Signed)
Preauth for dofetilide medication through [employer] approved through 01/24/24

## 2023-01-25 ENCOUNTER — Ambulatory Visit: Payer: 59

## 2023-01-25 DIAGNOSIS — G4733 Obstructive sleep apnea (adult) (pediatric): Secondary | ICD-10-CM

## 2023-01-25 NOTE — Telephone Encounter (Signed)
Lisinopril/hydrochlorothiazide (entered as a historic med) and lisinopril are both on medication list. I do not believe patient is taking lisinopril/hydrochlorothiazide. It has not been filled since August. Lisinopril was filled in Oct.  Please confirm with patient that he is NOT taking lisinopril/hydrochlorothiazide as he cannot take hydrochlorothiazide with Tikosyn.

## 2023-02-01 ENCOUNTER — Other Ambulatory Visit: Payer: Self-pay | Admitting: Cardiovascular Disease

## 2023-02-01 DIAGNOSIS — Z8249 Family history of ischemic heart disease and other diseases of the circulatory system: Secondary | ICD-10-CM

## 2023-02-01 DIAGNOSIS — E782 Mixed hyperlipidemia: Secondary | ICD-10-CM

## 2023-02-02 NOTE — Telephone Encounter (Signed)
Patient did home sleep study on 01/25/23

## 2023-02-04 ENCOUNTER — Ambulatory Visit: Payer: 59 | Admitting: Cardiology

## 2023-02-07 ENCOUNTER — Telehealth: Payer: Self-pay | Admitting: Pulmonary Disease

## 2023-02-07 DIAGNOSIS — G4733 Obstructive sleep apnea (adult) (pediatric): Secondary | ICD-10-CM

## 2023-02-07 NOTE — Telephone Encounter (Signed)
HST showed mod OSA with AHI 20/ hr Suggest autoCPAP  5-15 cm, mask of choice OV with me/APP in 8 wks

## 2023-02-07 NOTE — Telephone Encounter (Signed)
Results have been relayed to the patient/authorized caretaker. The patient/authorized caretaker verbalized understanding. No questions at this time.  Order has been placed and an appointment has been made.

## 2023-02-07 NOTE — Telephone Encounter (Signed)
Unable to leave message. VM either full or unavailable. Will try again later.   

## 2023-02-09 ENCOUNTER — Other Ambulatory Visit: Payer: Self-pay | Admitting: Internal Medicine

## 2023-02-09 DIAGNOSIS — G4733 Obstructive sleep apnea (adult) (pediatric): Secondary | ICD-10-CM | POA: Diagnosis not present

## 2023-02-10 ENCOUNTER — Encounter: Payer: Self-pay | Admitting: Internal Medicine

## 2023-02-10 ENCOUNTER — Ambulatory Visit (INDEPENDENT_AMBULATORY_CARE_PROVIDER_SITE_OTHER): Payer: 59 | Admitting: Internal Medicine

## 2023-02-10 VITALS — BP 132/82 | Ht 72.0 in | Wt 340.5 lb

## 2023-02-10 DIAGNOSIS — G4733 Obstructive sleep apnea (adult) (pediatric): Secondary | ICD-10-CM

## 2023-02-10 DIAGNOSIS — E291 Testicular hypofunction: Secondary | ICD-10-CM | POA: Diagnosis not present

## 2023-02-10 DIAGNOSIS — Z6841 Body Mass Index (BMI) 40.0 and over, adult: Secondary | ICD-10-CM

## 2023-02-10 DIAGNOSIS — K219 Gastro-esophageal reflux disease without esophagitis: Secondary | ICD-10-CM

## 2023-02-10 DIAGNOSIS — I4811 Longstanding persistent atrial fibrillation: Secondary | ICD-10-CM

## 2023-02-10 DIAGNOSIS — E11621 Type 2 diabetes mellitus with foot ulcer: Secondary | ICD-10-CM

## 2023-02-10 DIAGNOSIS — G4701 Insomnia due to medical condition: Secondary | ICD-10-CM | POA: Diagnosis not present

## 2023-02-10 DIAGNOSIS — M150 Primary generalized (osteo)arthritis: Secondary | ICD-10-CM

## 2023-02-10 DIAGNOSIS — I10 Essential (primary) hypertension: Secondary | ICD-10-CM | POA: Diagnosis not present

## 2023-02-10 DIAGNOSIS — E66813 Obesity, class 3: Secondary | ICD-10-CM

## 2023-02-10 DIAGNOSIS — N401 Enlarged prostate with lower urinary tract symptoms: Secondary | ICD-10-CM | POA: Diagnosis not present

## 2023-02-10 DIAGNOSIS — E7800 Pure hypercholesterolemia, unspecified: Secondary | ICD-10-CM

## 2023-02-10 DIAGNOSIS — R3911 Hesitancy of micturition: Secondary | ICD-10-CM

## 2023-02-10 DIAGNOSIS — F329 Major depressive disorder, single episode, unspecified: Secondary | ICD-10-CM | POA: Diagnosis not present

## 2023-02-10 DIAGNOSIS — L97509 Non-pressure chronic ulcer of other part of unspecified foot with unspecified severity: Secondary | ICD-10-CM

## 2023-02-10 LAB — POCT GLYCOSYLATED HEMOGLOBIN (HGB A1C): Hemoglobin A1C: 6.3 % — AB (ref 4.0–5.6)

## 2023-02-10 NOTE — Assessment & Plan Note (Signed)
Continue flomax  

## 2023-02-10 NOTE — Assessment & Plan Note (Signed)
Encourage diet and exercise for weight loss 

## 2023-02-10 NOTE — Assessment & Plan Note (Signed)
Lipid profile today Continue atorvastatin, ezetimibe and fish oil Encouraged low-fat diet

## 2023-02-10 NOTE — Assessment & Plan Note (Signed)
Controlled on lisinopril diltiazem, metoprolol and furosemide Reinforced DASH diet and exercise for weight loss Kidney function reviewed

## 2023-02-10 NOTE — Assessment & Plan Note (Signed)
Continue lamotrigine, fluoxetine and bupropion Support offered

## 2023-02-10 NOTE — Assessment & Plan Note (Signed)
Encourage weight loss as this can help reduce reflux symptoms Continue pantoprazole 

## 2023-02-10 NOTE — Assessment & Plan Note (Signed)
POCT A1c 6.3%.  I question his result and we will check a lab A1c for comparison Continue trulicity, jardiance, glipizide and januvia Reinforced low-carb diet and exercise for weight loss Encourage routine eye exam Encouraged routine foot exam Immunizations UTD

## 2023-02-10 NOTE — Assessment & Plan Note (Signed)
Continue diltiazem, metoprolol and eliquis per cardiology He is considering inpatient Tikosyn infusion

## 2023-02-10 NOTE — Assessment & Plan Note (Signed)
Not currently taking testosterone

## 2023-02-10 NOTE — Patient Instructions (Signed)

## 2023-02-10 NOTE — Telephone Encounter (Signed)
Requested Prescriptions  Pending Prescriptions Disp Refills   buPROPion (WELLBUTRIN SR) 150 MG 12 hr tablet [Pharmacy Med Name: BUPROPION HYDROCHLORIDE ER (SR) 150MG  SR TABLET ER 12HR] 90 tablet 0    Sig: TAKE ONE TABLET BY MOUTH ONCE A DAY     Psychiatry: Antidepressants - bupropion Passed - 02/10/2023  9:09 AM      Passed - Cr in normal range and within 360 days    Creat  Date Value Ref Range Status  08/04/2022 1.15 0.70 - 1.30 mg/dL Final   Creatinine, Ser  Date Value Ref Range Status  11/30/2022 1.23 0.76 - 1.27 mg/dL Final   Creatinine, Urine  Date Value Ref Range Status  08/04/2022 71 20 - 320 mg/dL Final         Passed - AST in normal range and within 360 days    AST  Date Value Ref Range Status  08/04/2022 16 10 - 35 U/L Final         Passed - ALT in normal range and within 360 days    ALT  Date Value Ref Range Status  08/04/2022 12 9 - 46 U/L Final         Passed - Completed PHQ-2 or PHQ-9 in the last 360 days      Passed - Last BP in normal range    BP Readings from Last 1 Encounters:  02/10/23 132/82         Passed - Valid encounter within last 6 months    Recent Outpatient Visits           Today Type 2 diabetes mellitus with foot ulcer, without long-term current use of insulin [facility])   [place] [facility] [place], [place], NP   2 months ago Type 2 diabetes mellitus with foot ulcer, without long-term current use of insulin [facility])   [place] [facility] [place], [place] W, NP   3 months ago Type 2 diabetes mellitus with foot ulcer, without long-term current use of insulin [facility])   [place] [facility] [place], Salvadore Oxford, NP   3 months ago Reactive depression   [place] [facility] [place], Salvadore Oxford, NP   6 months ago Encounter for general adult medical examination with abnormal findings   [place] [facility] [place], Salvadore Oxford, NP       Future Appointments              In 3 weeks Sherie Don, NP [facility] [place]   In 4 weeks Oretha Milch, MD [facility]   In 6 months [place], Salvadore Oxford, NP [place] [facility], [facility]

## 2023-02-10 NOTE — Assessment & Plan Note (Signed)
Not medicated Will monitor 

## 2023-02-10 NOTE — Assessment & Plan Note (Signed)
Continue CPAP use Encouraged weight loss as this can help reduce sleep apnea symptoms

## 2023-02-10 NOTE — Progress Notes (Signed)
Subjective:    Patient ID: Anthony Castillo, male    DOB: [birth year], 57 y.o.   MRN: [id number]  HPI  Patient presents to clinic today for 99-month follow-up of chronic conditions.  Depression: Chronic dysthymia.  He is taking lamotrigine, buproprion and fluoxetine as prescribed.  He is not currently seeing a therapist.  He denies anxiety, SI/HI.  DM2 with neuropathy: His last A1c was 10%, 10/2022.  He is taking trulicity, jardiance, [place] and glipizide as prescribed.  He is taking gabapentin as well.  He does not check his sugars.  He does not check his feet routinely.  His last eye exam was 06/2022.  Flu 10/2022.  Pneumovax 03/2021.  Prevnar 03/2017.  COVID x 2.  HTN: His BP today is 132/82.  He is taking diltiazem, metoprolol, lisinopril and furosemide as prescribed.  ECG from 12/2022 reviewed.  A-fib: Managed with diltiazem, metoprolol and eliquis.  He reports cardiology wants to schedule him for in-hospital tikosyn treatment.  ECG from 12/2022 reviewed.  He follows with cardiology.  BPH: He reports mainly urinary urgency, frequency and nocturia.  He is taking flomax as prescribed.  He does not follow with urology.  GERD: He is not sure what triggers this.  He denies breakthrough on pantoprazole.  There is no upper GI on file.  Hypogonadism: He is not currently taking testosterone.  He no longer follows with urology.  OA: Generalized.  He is taking celebrex, zanaflex and gabapentin with some relief of symptoms. He takes tylenol arthritis as needed for breakthrough.  He does not follow with orthopedics.  HLD: His last LDL was 21, triglycerides 409, 07/2022.  He denies myalgias on atorvastatin, ezetimibe and fish oil.  He does not consume a low-fat diet.  OSA: He averages less than 5-6 hours of sleep per night with the use of his new CPAP.  Sleep study from 12/2022 reviewed.  Insomnia: He has difficulty staying asleep.  He is not currently taking any medications for this. There is no sleep  study on file.  Review of Systems  Past Medical History:  Diagnosis Date   Chewing tobacco dependence    Chronic pain 04/09/2011   Coronary artery calcification seen on CT scan    a. 04/2018 Cardiac CT: Ca2+ = 103 (91st %'ile).   DEPRESSION 03/16/2007   Diastolic dysfunction    a. 10/2022 Echo: EF 55-60%, no rwma, GrI DD, nl RV size/fxn, mildly dil LA, mild AI. Asc Ao 39mm.   GERD (gastroesophageal reflux disease)    Hypogonadism male 04/09/2011   Mixed hyperlipidemia    Morbid obesity (HCC) 09/22/2006   Osteoarthritis 04/09/2011   Persistent atrial fibrillation (HCC)    a. 11/2022 s/p DCCV 150J->200J->200J->200J A/P->200J A/P--> RSR; b. CHA2DS2VASc = 2 (HTN/Cor Ca2+).   Primary hypertension    Sleep apnea    a. Not using CPAP.   Wears glasses     Current Outpatient Medications  Medication Sig Dispense Refill   Acetaminophen (TYLENOL ARTHRITIS PAIN PO) Take by mouth as needed.     albuterol (VENTOLIN HFA) 108 (90 Base) MCG/ACT inhaler Inhale 2 puffs into the lungs every 6 (six) hours as needed for wheezing or shortness of breath. 8 g 6   apixaban (ELIQUIS) 5 MG TABS tablet Take 1 tablet (5 mg total) by mouth 2 (two) times daily. 180 tablet 3   augmented betamethasone dipropionate (DIPROLENE-AF) 0.05 % ointment Apply topically.     buPROPion (WELLBUTRIN SR) 150 MG 12 hr tablet TAKE ONE TABLET  BY MOUTH ONCE A DAY 90 tablet 0   celecoxib (CELEBREX) 100 MG capsule TAKE ONE CAPSULE BY MOUTH TWICE DAILY 180 capsule 0   cetirizine (ZYRTEC) 10 MG tablet Take 10 mg by mouth daily.     Cholecalciferol (VITAMIN D3) 1000 units CAPS Take 1 capsule by mouth daily.     diltiazem (CARDIZEM CD) 180 MG 24 hr capsule Take 1 capsule (180 mg total) by mouth daily. 90 capsule 3   Dulaglutide (TRULICITY) 0.75 MG/0.5ML SOPN Inject 0.75 mg into the skin once a week. 6 mL 0   empagliflozin (JARDIANCE) 25 MG TABS tablet Take 1 tablet (25 mg total) by mouth daily before breakfast. 90 tablet 1   ezetimibe  (ZETIA) 10 MG tablet TAKE ONE TABLET (10 MG TOTAL) BY MOUTH DAILY. 90 tablet 3   fluocinonide cream (LIDEX) 0.05 % as needed.   1   FLUoxetine (PROZAC) 40 MG capsule TAKE ONE CAPSULE BY MOUTH ONCE DAILY 90 capsule 0   furosemide (LASIX) 20 MG tablet Take 1 tablet (20 mg total) by mouth daily. 90 tablet 3   glipiZIDE (GLUCOTROL) 10 MG tablet TAKE ONE TABLET BY MOUTH TWICE A DAY BEFORE A MEAL. 180 tablet 0   JANUVIA 100 MG tablet TAKE ONE TABLET BY MOUTH ONCE A DAY 90 tablet 0   lamoTRIgine (LAMICTAL) 100 MG tablet TAKE ONE TABLET BY MOUTH EVERY MORNING 90 tablet 0   lisinopril (ZESTRIL) 10 MG tablet Take 1 tablet (10 mg total) by mouth daily. 90 tablet 1   lisinopril-hydrochlorothiazide (ZESTORETIC) 20-12.5 MG tablet Take 1 tablet by mouth 2 (two) times daily.     metoprolol succinate (TOPROL-XL) 50 MG 24 hr tablet Take 1 tablet (50 mg total) by mouth daily. Take with or immediately following a meal. 90 tablet 3   nystatin-triamcinolone ointment (MYCOLOG) Apply 1 Application topically 2 (two) times daily. 30 g 0   pantoprazole (PROTONIX) 40 MG tablet TAKE ONE TABLET BY MOUTH ONCE A DAY 90 tablet 2   potassium chloride (KLOR-CON) 10 MEQ tablet TAKE ONE TABLET BY MOUTH ONCE A DAY 90 tablet 1   rosuvastatin (CRESTOR) 10 MG tablet Take 1 tablet (10 mg total) by mouth daily. 90 tablet 0   sitaGLIPtin (JANUVIA) 100 MG tablet Take 1 tablet (100 mg total) by mouth daily. 90 tablet 0   tamsulosin (FLOMAX) 0.4 MG CAPS capsule TAKE ONE CAPSULE BY MOUTH ONCE DAILY 90 capsule 0   No current facility-administered medications for this visit.    Allergies  Allergen Reactions   Penicillins     REACTION: unspecified    Family History  Problem Relation Age of Onset   Lymphoma Mother    Heart disease Father    Hypertension Father    Diabetes Sister    Diabetes Maternal Grandmother    Arthritis Paternal Grandmother    Heart disease Paternal Grandfather    Migraines Daughter    Irritable bowel syndrome  Daughter    Heart Problems Son        valve    Stroke Paternal Uncle    Colon cancer Neg Hx    Prostate cancer Neg Hx     Social History   Socioeconomic History   Marital status: Married    Spouse name: Not on file   Number of children: Not on file   Years of education: Not on file   Highest education level: Not on file  Occupational History   Not on file  Tobacco Use   Smoking  status: Never    Passive exposure: Never   Smokeless tobacco: Current    Types: Chew   Tobacco comments:    has been using chew 40 years  Vaping Use   Vaping status: Never Used  Substance and Sexual Activity   Alcohol use: Yes    Comment: rare   Drug use: Never   Sexual activity: Yes  Other Topics Concern   Not on file  Social History Narrative   Not on file   Social Drivers of Health   Financial Resource Strain: Not on file  Food Insecurity: Not on file  Transportation Needs: Not on file  Physical Activity: Not on file  Stress: Not on file  Social Connections: Not on file  Intimate Partner Violence: Not on file     Constitutional: Patient reports fatigue.  Denies fever, malaise, headache or abrupt weight changes.  HEENT: Denies eye pain, eye redness, ear pain, ringing in the ears, wax buildup, runny nose, nasal congestion, bloody nose, or sore throat. Respiratory: Patient reports shortness of breath.  Denies difficulty breathing, cough or sputum production.   Cardiovascular: Patient reports swelling in legs.  Denies chest pain, chest tightness, palpitations or swelling in the hands.  Gastrointestinal: Denies abdominal pain, bloating, constipation, diarrhea or blood in the stool.  GU: Patient reports urgency, frequency and nocturia.  Denies pain with urination, burning sensation, blood in urine, odor or discharge. Musculoskeletal: Patient reports chronic joint pain.  Denies decrease in range of motion, difficulty with gait, muscle pain or joint swelling.  Skin: Denies redness, rashes,  lesions or ulcercations.  Neurological: Patient reports insomnia.  Denies dizziness, difficulty with memory, difficulty with speech or problems with balance and coordination.  Psych: Patient has a history of depression.  Denies anxiety, SI/HI.  No other specific complaints in a complete review of systems (except as listed in HPI above).     Objective:   Physical Exam  BP 132/82 (BP Location: Left Arm, Patient Position: Sitting, Cuff Size: Large)   Ht 6' (1.829 m)   Wt (!) 340 lb 8 oz (154.4 kg)   BMI 46.18 kg/m    Wt Readings from Last 3 Encounters:  01/18/23 (!) 339 lb 6 oz (153.9 kg)  01/06/23 (!) 343 lb 3.2 oz (155.7 kg)  12/02/22 (!) 350 lb (158.8 kg)    General: Appears his stated age, obese, in NAD. Skin: Warm, dry and intact. No ulcerations noted. HEENT: Head: normal shape and size; Eyes: sclera white, no icterus, conjunctiva pink, PERRLA and EOMs intact;  Cardiovascular: Normal rate and rhythm. S1,S2 noted.  No murmur, rubs or gallops noted. No JVD.  No BLE edema. No carotid bruits noted. Pulmonary/Chest: Normal effort and positive vesicular breath sounds. No respiratory distress. No wheezes, rales or ronchi noted.  Abdomen: Soft and nontender. Normal bowel sounds.  Musculoskeletal: No difficulty with gait.  Neurological: Alert and oriented.  Coordination normal.  Psychiatric: Mood and affect mildly flat. Behavior is normal. Judgment and thought content normal.    BMET    Component Value Date/Time   NA 136 11/30/2022 0951   K 3.9 11/30/2022 0951   CL 95 (L) 11/30/2022 0951   CO2 21 11/30/2022 0951   GLUCOSE 226 (H) 11/30/2022 0951   GLUCOSE 197 (H) 08/04/2022 1149   BUN 14 11/30/2022 0951   CREATININE 1.23 11/30/2022 0951   CREATININE 1.15 08/04/2022 1149   CALCIUM 9.4 11/30/2022 0951   GFRNONAA 69 08/05/2020 0955   GFRAA 79 08/05/2020 0955  Lipid Panel     Component Value Date/Time   CHOL 79 08/04/2022 1149   TRIG 284 (H) 08/04/2022 1149   HDL 25 (L)  08/04/2022 1149   CHOLHDL 3.2 08/04/2022 1149   VLDL 28.0 01/23/2020 1413   LDLCALC 21 08/04/2022 1149    CBC    Component Value Date/Time   WBC 5.5 11/30/2022 0951   WBC 6.0 08/04/2022 1149   RBC 5.69 11/30/2022 0951   RBC 5.55 08/04/2022 1149   HGB 15.0 11/30/2022 0951   HCT 47.7 11/30/2022 0951   PLT 217 11/30/2022 0951   MCV 84 11/30/2022 0951   MCH 26.4 (L) 11/30/2022 0951   MCH 26.3 (L) 08/04/2022 1149   MCHC 31.4 (L) 11/30/2022 0951   MCHC 32.4 08/04/2022 1149   RDW 14.7 11/30/2022 0951   LYMPHSABS 1.3 04/15/2021 1024   EOSABS 0.1 04/15/2021 1024   BASOSABS 0.1 04/15/2021 1024    Hgb A1C Lab Results  Component Value Date   HGBA1C 10.0 (A) 11/04/2022            Assessment & Plan:     RTC in 6 months for annual exam Nicki Reaper, NP

## 2023-02-10 NOTE — Assessment & Plan Note (Signed)
Encourage weight loss as this can help reduce joint pain Continue celebrex, zanaflex and gabapentin

## 2023-02-11 LAB — LIPID PANEL
Cholesterol: 104 mg/dL (ref <200)
HDL: 34 mg/dL — ABNORMAL LOW (ref >=40)
LDL Cholesterol (Calc): 42 mg/dL
Non-HDL Cholesterol (Calc): 70 mg/dL (ref <130)
Total CHOL/HDL Ratio: 3.1 (calc) (ref <5.0)
Triglycerides: 211 mg/dL — ABNORMAL HIGH (ref <150)

## 2023-02-11 LAB — HEMOGLOBIN A1C
Hgb A1c MFr Bld: 7.3 %{Hb} — ABNORMAL HIGH (ref <5.7)
Mean Plasma Glucose: 163 mg/dL
eAG (mmol/L): 9 mmol/L

## 2023-02-15 DIAGNOSIS — G4733 Obstructive sleep apnea (adult) (pediatric): Secondary | ICD-10-CM | POA: Diagnosis not present

## 2023-02-17 ENCOUNTER — Other Ambulatory Visit: Payer: Self-pay | Admitting: Internal Medicine

## 2023-02-17 NOTE — Telephone Encounter (Signed)
Requested Prescriptions  Pending Prescriptions Disp Refills   TRULICITY 0.75 MG/0.5ML SOAJ [Pharmacy Med Name: TRULICITY 0.75/0.5 SOLN AUTO-INJ] 6 mL 0    Sig: INJECT 0.75 MG INTO THE SKIN ONCE A WEEK.     Endocrinology:  Diabetes - GLP-1 Receptor Agonists Passed - 02/17/2023  2:00 PM      Passed - HBA1C is between 0 and 7.9 and within 180 days    Hemoglobin A1C  Date Value Ref Range Status  02/10/2023 6.3 (A) 4.0 - 5.6 % Final   HbA1c POC (<> result, manual entry)  Date Value Ref Range Status  12/09/2021 10.3 4.0 - 5.6 % Final   Hgb A1c MFr Bld  Date Value Ref Range Status  02/10/2023 7.3 (H) <5.7 % of total Hgb Final    Comment:    For someone without known diabetes, a hemoglobin A1c value of 6.5% or greater indicates that they may have  diabetes and this should be confirmed with a follow-up  test. . For someone with known diabetes, a value <7% indicates  that their diabetes is well controlled and a value  greater than or equal to 7% indicates suboptimal  control. A1c targets should be individualized based on  duration of diabetes, age, comorbid conditions, and  other considerations. . Currently, no consensus exists regarding use of hemoglobin A1c for diagnosis of diabetes for children. Verna Czech - Valid encounter within last 6 months    Recent Outpatient Visits           1 week ago Type 2 diabetes mellitus with foot ulcer, without long-term current use of insulin [facility])   [place] [facility] [place], [place] W, NP   2 months ago Type 2 diabetes mellitus with foot ulcer, without long-term current use of insulin [facility])   [place] [facility] [place], [place] W, NP   3 months ago Type 2 diabetes mellitus with foot ulcer, without long-term current use of insulin [facility])   [place] [facility] [place], Salvadore Oxford, NP   4 months ago Reactive depression   [place] [facility] [place], Salvadore Oxford, NP   6  months ago Encounter for general adult medical examination with abnormal findings   [place] [facility] [place], Salvadore Oxford, NP       Future Appointments             In 2 weeks Sherie Don, NP [place] [facility] [place]   In 1 month Oretha Milch, MD [facility] at [facility], [facility]   In 5 months Baity, Salvadore Oxford, NP [facility], [facility]             lamoTRIgine (LAMICTAL) 100 MG tablet [Pharmacy Med Name: LAMOTRIGINE 100MG  TABLET] 90 tablet 0    Sig: TAKE ONE TABLET BY MOUTH EVERY MORNING     Neurology:  Anticonvulsants - lamotrigine Passed - 02/17/2023  2:00 PM      Passed - Cr in normal range and within 360 days    Creat  Date Value Ref Range Status  08/04/2022 1.15 0.70 - 1.30 mg/dL Final   Creatinine, Ser  Date Value Ref Range Status  11/30/2022 1.23 0.76 - 1.27 mg/dL Final   Creatinine, Urine  Date Value Ref Range Status  08/04/2022 71 20 - 320 mg/dL Final         Passed -  ALT in normal range and within 360 days    ALT  Date Value Ref Range Status  08/04/2022 12 9 - 46 U/L Final         Passed - AST in normal range and within 360 days    AST  Date Value Ref Range Status  08/04/2022 16 10 - 35 U/L Final         Passed - Completed PHQ-2 or PHQ-9 in the last 360 days      Passed - Valid encounter within last 12 months    Recent Outpatient Visits           1 week ago Type 2 diabetes mellitus with foot ulcer, without long-term current use of insulin [facility])   [place] [facility] [place], [place] W, NP   2 months ago Type 2 diabetes mellitus with foot ulcer, without long-term current use of insulin [facility])   [place] [facility] [place], [place] W, NP   3 months ago Type 2 diabetes mellitus with foot ulcer, without long-term current use of insulin [facility])   [place] [facility] [place], Salvadore Oxford, NP   4 months ago Reactive depression   [place]  [facility] [place], Salvadore Oxford, NP   6 months ago Encounter for general adult medical examination with abnormal findings   [place] [facility] [place], Salvadore Oxford, NP       Future Appointments             In 2 weeks Sherie Don, NP  [facility] at [place]   In 1 month Oretha Milch, MD [facility] at [facility], [facility]   In 5 months Baity, Salvadore Oxford, NP  [facility]

## 2023-02-21 ENCOUNTER — Other Ambulatory Visit: Payer: Self-pay | Admitting: Internal Medicine

## 2023-02-21 DIAGNOSIS — M2550 Pain in unspecified joint: Secondary | ICD-10-CM

## 2023-02-21 DIAGNOSIS — M791 Myalgia, unspecified site: Secondary | ICD-10-CM

## 2023-02-22 NOTE — Telephone Encounter (Signed)
Requested Prescriptions  Pending Prescriptions Disp Refills   celecoxib (CELEBREX) 100 MG capsule [Pharmacy Med Name: CELECOXIB 100MG  CAPSULE] 180 capsule 0    Sig: TAKE ONE CAPSULE BY MOUTH TWICE DAILY     Analgesics:  COX2 Inhibitors Failed - 02/22/2023  2:37 PM      Failed - Manual Review: Labs are only required if the patient has taken medication for more than 8 weeks.      Passed - HGB in normal range and within 360 days    Hemoglobin  Date Value Ref Range Status  11/30/2022 15.0 13.0 - 17.7 g/dL Final         Passed - Cr in normal range and within 360 days    Creat  Date Value Ref Range Status  08/04/2022 1.15 0.70 - 1.30 mg/dL Final   Creatinine, Ser  Date Value Ref Range Status  11/30/2022 1.23 0.76 - 1.27 mg/dL Final   Creatinine, Urine  Date Value Ref Range Status  08/04/2022 71 20 - 320 mg/dL Final         Passed - HCT in normal range and within 360 days    Hematocrit  Date Value Ref Range Status  11/30/2022 47.7 37.5 - 51.0 % Final         Passed - AST in normal range and within 360 days    AST  Date Value Ref Range Status  08/04/2022 16 10 - 35 U/L Final         Passed - ALT in normal range and within 360 days    ALT  Date Value Ref Range Status  08/04/2022 12 9 - 46 U/L Final         Passed - eGFR is 30 or above and within 360 days    GFR, Est African American  Date Value Ref Range Status  08/05/2020 79 > OR = 60 mL/min/1.60m2 Final   GFR, Est Non African American  Date Value Ref Range Status  08/05/2020 69 > OR = 60 mL/min/1.70m2 Final   GFR  Date Value Ref Range Status  01/23/2020 67.67 >60.00 mL/min Final    Comment:    Calculated using the CKD-EPI Creatinine Equation (2021)   eGFR  Date Value Ref Range Status  11/30/2022 68 >59 mL/min/1.73 Final         Passed - Patient is not pregnant      Passed - Valid encounter within last 12 months    Recent Outpatient Visits           1 week ago Type 2 diabetes mellitus with foot ulcer,  without long-term current use of insulin (HCC)   [place] [facility] [place], [place] W, NP   2 months ago Type 2 diabetes mellitus with foot ulcer, without long-term current use of insulin [facility])   [place] [facility] [place], [place] W, NP   3 months ago Type 2 diabetes mellitus with foot ulcer, without long-term current use of insulin [facility])   [place] [facility] [place], Salvadore Oxford, NP   4 months ago Reactive depression   [place] [facility] [place], Salvadore Oxford, NP   6 months ago Encounter for general adult medical examination with abnormal findings   [place] [facility] [place], Salvadore Oxford, NP       Future Appointments             In 1 week Sherie Don, NP [facility]  [facility] at [place]   In 1 month Oretha Milch, MD [facility] at [facility], [facility]   In 5 months Baity, Salvadore Oxford, NP [facility]

## 2023-03-05 NOTE — Progress Notes (Signed)
 Cardiology Office Note Date:  03/07/2023  Patient ID:  Anthony, Castillo 1965/12/12, MRN [id number] PCP:  Antonette Angeline ORN, NP  Cardiologist:  Evalene Lunger, MD Electrophysiologist: Fonda Kitty, MD (though has not met yet)   Discussed the use of AI scribe software for clinical note transcription with the patient, who gave verbal consent to proceed.    Chief Complaint: afib  History of Present Illness: Anthony Castillo is a 58 y.o. male with PMH notable for afib, non-obs CAD by imaging, OSA, HTN, T2DM, HFpEF; seen today for  electrophysiology evaluation of AFib.  He was initially diagnosed with AFib 09/2022 with symptoms of cough, edema, and fatigue. He was started on metop, dilt and eliquis  at that visit. He is s/p successful DCCV 10/3 but converted back to AFib prior to follow-up 11/7. He had less SOB and more energy when in sinus rhythm. I saw him 11/19 where he remained in rate-controlled AFib. We discussed AAD options, only options tikosyn  or amiodarone with CAD and did not favor amio given young age. He was not interested in tikosyn  at the time, but maybe in future.   On follow-up today, SOB continues at same level. He did have one episode of anxiety with palpitations last week, but usually does not have palpitations.  He has thought more about tikosyn  admission and is agreeable to moving forward.  No bleeding concerns on eliquis .  He has CPAP now and tolerating it very well. Using every night.    Wife joins for visit.    AAD History: None   ROS:  Please see the history of present illness. All other systems are reviewed and otherwise negative.   PHYSICAL EXAM:   Vitals:   03/07/23 1257 03/07/23 1342  BP: (!) 166/80 (!) 148/86  Pulse: 75   Height: 6' (1.829 m)   Weight: (!) 339 lb 9.6 oz (154 kg)   SpO2: 95%   BMI (Calculated): 46.05     Wt Readings from Last 3 Encounters:  03/07/23 (!) 339 lb 9.6 oz (154 kg)  02/10/23 (!) 340 lb 8 oz (154.4 kg)  01/18/23 (!)  339 lb 6 oz (153.9 kg)    GEN- The patient is well appearing, alert and oriented x 3 today.   Lungs- Clear to ausculation bilaterally, normal work of breathing.  Heart- Irregularly irregular rate and rhythm, no murmurs, rubs or gallops Extremities- Trace peripheral edema, warm, dry   EKG is ordered. Personal review of EKG from today shows:    EKG Interpretation Date/Time:  Monday March 07 2023 13:02:18 EST Ventricular Rate:  75 PR Interval:    QRS Duration:  92 QT Interval:  386 QTC Calculation: 431 R Axis:   29  Text Interpretation: Atrial fibrillation Confirmed by Violet Cart [phone]) on 03/07/2023 1:04:38 PM      Additional studies reviewed include: Previous EP, cardiology notes.   TTE, 11/03/2022  1. Left ventricular ejection fraction, by estimation, is 55 to 60%. The left ventricle has normal function. The left ventricle has no regional wall motion abnormalities. Left ventricular diastolic parameters are consistent with Grade I diastolic dysfunction (impaired relaxation).   2. Right ventricular systolic function is normal. The right ventricular size is normal. Tricuspid regurgitation signal is inadequate for assessing PA pressure.   3. Left atrial size was mildly dilated.   4. The mitral valve is normal in structure. No evidence of mitral valve regurgitation. No evidence of mitral stenosis.   5. The aortic valve has an indeterminant  number of cusps. Aortic valve regurgitation is mild. No aortic stenosis is present.   6. There is borderline dilatation of the ascending aorta, measuring 39 mm.   7. The inferior vena cava is normal in size with greater than 50% respiratory variability, suggesting right atrial pressure of 3 mmHg.   CT cardiac score, 04/11/2018 1. Coronary calcium  score of 103. This was 47 percentile for age and sex matched control.  2. Mildly dilated pulmonary artery measuring 32 mm suggestive of pulmonary hypertension.       ASSESSMENT AND PLAN:  #) persis  afib Ventricular rates well-controlled Continue 180mg  dilt and 50mg  toprol  daily Limited AAD options with CAD.  Do not favor amiodarone d/t young age Discussed tikosyn  admission, patient ready to move forward  #) Hypercoag d/t persis afib CHA2DS2-VASc Score = 3 [CHF History: 0, HTN History: 1, Diabetes History: 1, Stroke History: 0, Vascular Disease History: 1, Age Score: 0, Gender Score: 0].  Therefore, the patient's annual risk of stroke is 3.2 %.    Stroke ppx - 5mg  eliquis  BID, appropriately dosed No bleeding concerns Recommended he stop celebrex , and switch to tylenol  for OA symptoms  #) HTN Elevated in office today on re-check. Patient admits to dietary indiscretions with the holidays Encouraged reduced dietary sodium and increased physical activity  #) OSA Previously diagnosed with OSA but intolerant of CPAP mask He has no CPAP machine and is tolerating it well, encouraged nightly use        Current medicines are reviewed at length with the patient today.   The patient does not have concerns regarding his medicines.  The following changes were made today:   none  Labs/ tests ordered today include:  Orders Placed This Encounter  Procedures   EKG 12-Lead     Disposition: Follow up with Dr. Kennyth after tikosyn  admission, or in 2 months  Prefer MD only to establish care with MD   Signed, Chantal Needle, NP  03/07/23  1:48 PM  Electrophysiology [facility]

## 2023-03-07 ENCOUNTER — Ambulatory Visit: Payer: 59 | Attending: Cardiology | Admitting: Cardiology

## 2023-03-07 ENCOUNTER — Encounter: Payer: Self-pay | Admitting: Cardiology

## 2023-03-07 VITALS — BP 148/86 | HR 75 | Ht 72.0 in | Wt 339.6 lb

## 2023-03-07 DIAGNOSIS — G4733 Obstructive sleep apnea (adult) (pediatric): Secondary | ICD-10-CM

## 2023-03-07 DIAGNOSIS — D6869 Other thrombophilia: Secondary | ICD-10-CM | POA: Diagnosis not present

## 2023-03-07 DIAGNOSIS — I4819 Other persistent atrial fibrillation: Secondary | ICD-10-CM

## 2023-03-07 DIAGNOSIS — I10 Essential (primary) hypertension: Secondary | ICD-10-CM | POA: Diagnosis not present

## 2023-03-07 NOTE — Patient Instructions (Signed)
 Medication Instructions:   Your physician recommends that you continue on your current medications as directed. Please refer to the Current Medication list given to you today.   *If you need a refill on your cardiac medications before your next appointment, please call your pharmacy*   Lab Work:  None Ordered  If you have labs (blood work) drawn today and your tests are completely normal, you will receive your results only by: MyChart Message (if you have MyChart) OR A paper copy in the mail If you have any lab test that is abnormal or we need to change your treatment, we will call you to review the results.   Testing/Procedures:  None Ordered    Follow-Up: At [facility], you and your health needs are our priority.  As part of our continuing mission to provide you with exceptional heart care, we have created designated Provider Care Teams.  These Care Teams include your primary Cardiologist (physician) and Advanced Practice Providers (APPs -  Physician Assistants and Nurse Practitioners) who all work together to provide you with the care you need, when you need it.  We recommend signing up for the patient portal called MyChart.  Sign up information is provided on this After Visit Summary.  MyChart is used to connect with patients for Virtual Visits (Telemedicine).  Patients are able to view lab/test results, encounter notes, upcoming appointments, etc.  Non-urgent messages can be sent to your provider as well.   To learn more about what you can do with MyChart, go to [email or web address].    Your next appointment:    We will reach out.

## 2023-03-10 ENCOUNTER — Ambulatory Visit (HOSPITAL_BASED_OUTPATIENT_CLINIC_OR_DEPARTMENT_OTHER): Payer: 59 | Admitting: Pulmonary Disease

## 2023-03-11 DIAGNOSIS — G4733 Obstructive sleep apnea (adult) (pediatric): Secondary | ICD-10-CM | POA: Diagnosis not present

## 2023-03-11 NOTE — Telephone Encounter (Signed)
 Patient is not on hydrochlorothiazide. Pt to contact PCP regarding Lamotrigine and fluoxetine needing to be stopped and replaced with non-QT prolonging medications. Will schedule admission once timeline for change is known.

## 2023-03-12 DIAGNOSIS — G4733 Obstructive sleep apnea (adult) (pediatric): Secondary | ICD-10-CM | POA: Diagnosis not present

## 2023-03-14 ENCOUNTER — Telehealth: Payer: Self-pay | Admitting: Cardiology

## 2023-03-14 ENCOUNTER — Telehealth (INDEPENDENT_AMBULATORY_CARE_PROVIDER_SITE_OTHER): Payer: 59 | Admitting: Internal Medicine

## 2023-03-14 ENCOUNTER — Encounter: Payer: Self-pay | Admitting: Internal Medicine

## 2023-03-14 ENCOUNTER — Other Ambulatory Visit: Payer: Self-pay | Admitting: Internal Medicine

## 2023-03-14 DIAGNOSIS — I4811 Longstanding persistent atrial fibrillation: Secondary | ICD-10-CM | POA: Diagnosis not present

## 2023-03-14 DIAGNOSIS — Z79899 Other long term (current) drug therapy: Secondary | ICD-10-CM

## 2023-03-14 DIAGNOSIS — F329 Major depressive disorder, single episode, unspecified: Secondary | ICD-10-CM

## 2023-03-14 DIAGNOSIS — F39 Unspecified mood [affective] disorder: Secondary | ICD-10-CM

## 2023-03-14 MED ORDER — BUPROPION HCL ER (SR) 150 MG PO TB12: 150.0000 mg | ORAL_TABLET | Freq: Every day | ORAL | Status: DC

## 2023-03-14 MED ORDER — DULOXETINE HCL 30 MG PO CPEP: ORAL_CAPSULE | ORAL | 0 refills | Status: DC

## 2023-03-14 NOTE — Telephone Encounter (Signed)
 Wife is calling in about patient's medication, they have some questions/concerns. Please advise

## 2023-03-14 NOTE — Progress Notes (Signed)
 Virtual Visit via Video Note  I connected with Anthony Castillo on 03/14/23 at 11:20 AM EST by a video enabled telemedicine application and verified that I am speaking with the correct person using two identifiers.  Location: Patient: Home Provider: Office  Persons participating in this video call: Angeline Laura, NP and Zachary Sorrel.   I discussed the limitations of evaluation and management by telemedicine and the availability of in person appointments. The patient expressed understanding and agreed to proceed.  History of Present Illness:   Discussed the use of AI scribe software for clinical note transcription with the patient, who gave verbal consent to proceed.   The patient, with a history of atrial fibrillation, is being considered for Tikosyn  therapy, which necessitates a three-day hospital stay. However, the patient's current medications, Prozac and Lamotrigine, are known to interact with Tikosyn  and need to be discontinued. He has been on these medications for a long time, and there is concern about potential mood changes upon discontinuation.  In addition, he has been experiencing total body aches, for which he has been taking Celebrex . However, there is concern about the increased risk of bleeding while he is on Eliquis , a blood thinner. He tried discontinuing Celebrex  but found the pain unbearable. He also reports needing to add Tylenol  to manage the pain during damp and cold weather.      Past Medical History:  Diagnosis Date   Chewing tobacco dependence    Chronic pain 04/09/2011   Coronary artery calcification seen on CT scan    a. 04/2018 Cardiac CT: Ca2+ = 103 (91st %'ile).   DEPRESSION 03/16/2007   Diastolic dysfunction    a. 10/2022 Echo: EF 55-60%, no rwma, GrI DD, nl RV size/fxn, mildly dil LA, mild AI. Asc Ao 39mm.   GERD (gastroesophageal reflux disease)    Hypogonadism male 04/09/2011   Mixed hyperlipidemia    Morbid obesity (HCC) 09/22/2006   Osteoarthritis  04/09/2011   Persistent atrial fibrillation (HCC)    a. 11/2022 s/p DCCV 150J->200J->200J->200J A/P->200J A/P--> RSR; b. CHA2DS2VASc = 2 (HTN/Cor Ca2+).   Primary hypertension    Sleep apnea    a. Not using CPAP.   Wears glasses     Current Outpatient Medications  Medication Sig Dispense Refill   Acetaminophen  (TYLENOL  ARTHRITIS PAIN PO) Take by mouth as needed.     albuterol  (VENTOLIN  HFA) 108 (90 Base) MCG/ACT inhaler Inhale 2 puffs into the lungs every 6 (six) hours as needed for wheezing or shortness of breath. 8 g 6   apixaban  (ELIQUIS ) 5 MG TABS tablet Take 1 tablet (5 mg total) by mouth 2 (two) times daily. 180 tablet 3   buPROPion  (WELLBUTRIN  SR) 150 MG 12 hr tablet TAKE ONE TABLET BY MOUTH ONCE A DAY 90 tablet 0   celecoxib  (CELEBREX ) 100 MG capsule TAKE ONE CAPSULE BY MOUTH TWICE DAILY 180 capsule 0   cetirizine (ZYRTEC) 10 MG tablet Take 10 mg by mouth daily.     Cholecalciferol (VITAMIN D3) 1000 units CAPS Take 1 capsule by mouth daily.     Continuous Glucose Sensor (DEXCOM G7 SENSOR) MISC      diltiazem  (CARDIZEM  CD) 180 MG 24 hr capsule Take 1 capsule (180 mg total) by mouth daily. 90 capsule 3   empagliflozin  (JARDIANCE ) 25 MG TABS tablet Take 1 tablet (25 mg total) by mouth daily before breakfast. 90 tablet 1   ezetimibe  (ZETIA ) 10 MG tablet TAKE ONE TABLET (10 MG TOTAL) BY MOUTH DAILY. 90 tablet 3  fluocinonide cream (LIDEX) 0.05 % as needed.   1   FLUoxetine (PROZAC) 40 MG capsule TAKE ONE CAPSULE BY MOUTH ONCE DAILY 90 capsule 0   furosemide  (LASIX ) 20 MG tablet Take 1 tablet (20 mg total) by mouth daily. 90 tablet 3   glipiZIDE  (GLUCOTROL ) 10 MG tablet TAKE ONE TABLET BY MOUTH TWICE A DAY BEFORE A MEAL. 180 tablet 0   JANUVIA  100 MG tablet TAKE ONE TABLET BY MOUTH ONCE A DAY 90 tablet 0   lamoTRIgine (LAMICTAL) 100 MG tablet TAKE ONE TABLET BY MOUTH EVERY MORNING 90 tablet 0   lisinopril  (ZESTRIL ) 10 MG tablet Take 1 tablet (10 mg total) by mouth daily. 90 tablet 1    metoprolol  succinate (TOPROL -XL) 50 MG 24 hr tablet Take 1 tablet (50 mg total) by mouth daily. Take with or immediately following a meal. 90 tablet 3   nystatin -triamcinolone  ointment (MYCOLOG) Apply 1 Application topically 2 (two) times daily. 30 g 0   pantoprazole  (PROTONIX ) 40 MG tablet TAKE ONE TABLET BY MOUTH ONCE A DAY 90 tablet 2   potassium chloride  (KLOR-CON ) 10 MEQ tablet TAKE ONE TABLET BY MOUTH ONCE A DAY 90 tablet 1   rosuvastatin  (CRESTOR ) 10 MG tablet Take 1 tablet (10 mg total) by mouth daily. 90 tablet 0   sitaGLIPtin  (JANUVIA ) 100 MG tablet Take 1 tablet (100 mg total) by mouth daily. 90 tablet 0   tamsulosin  (FLOMAX ) 0.4 MG CAPS capsule TAKE ONE CAPSULE BY MOUTH ONCE DAILY 90 capsule 0   TRULICITY  0.75 MG/0.5ML SOAJ INJECT 0.75 MG INTO THE SKIN ONCE A WEEK. 6 mL 0   No current facility-administered medications for this visit.    Allergies  Allergen Reactions   Penicillins     REACTION: unspecified    Family History  Problem Relation Age of Onset   Lymphoma Mother    Heart disease Father    Hypertension Father    Diabetes Sister    Diabetes Maternal Grandmother    Arthritis Paternal Grandmother    Heart disease Paternal Grandfather    Migraines Daughter    Irritable bowel syndrome Daughter    Heart Problems Son        valve    Stroke Paternal Uncle    Colon cancer Neg Hx    Prostate cancer Neg Hx     Social History   Socioeconomic History   Marital status: Married    Spouse name: Not on file   Number of children: Not on file   Years of education: Not on file   Highest education level: Not on file  Occupational History   Not on file  Tobacco Use   Smoking status: Never    Passive exposure: Never   Smokeless tobacco: Current    Types: Chew   Tobacco comments:    has been using chew 40 years  Vaping Use   Vaping status: Never Used  Substance and Sexual Activity   Alcohol use: Yes    Comment: rare   Drug use: Never   Sexual activity: Yes   Other Topics Concern   Not on file  Social History Narrative   Not on file   Social Drivers of Health   Financial Resource Strain: Patient Declined (02/10/2023)   Overall Financial Resource Strain (CARDIA)    Difficulty of Paying Living Expenses: Patient declined  Food Insecurity: Patient Declined (02/10/2023)   Hunger Vital Sign    Worried About Running Out of Food in the Last Year: Patient declined  Ran Out of Food in the Last Year: Patient declined  Transportation Needs: Patient Declined (02/10/2023)   PRAPARE - Administrator, Civil Service (Medical): Patient declined    Lack of Transportation (Non-Medical): Patient declined  Physical Activity: Patient Declined (02/10/2023)   Exercise Vital Sign    Days of Exercise per Week: Patient declined    Minutes of Exercise per Session: Patient declined  Stress: Patient Declined (02/10/2023)   [employer] of Occupational Health - Occupational Stress Questionnaire    Feeling of Stress : Patient declined  Social Connections: Patient Declined (02/10/2023)   Social Connection and Isolation Panel [NHANES]    Frequency of Communication with Friends and Family: Patient declined    Frequency of Social Gatherings with Friends and Family: Patient declined    Attends Religious Services: Patient declined    Database Administrator or Organizations: Patient declined    Attends Banker Meetings: Patient declined    Marital Status: Patient declined  Intimate Partner Violence: Patient Declined (02/10/2023)   Humiliation, Afraid, Rape, and Kick questionnaire    Fear of Current or Ex-Partner: Patient declined    Emotionally Abused: Patient declined    Physically Abused: Patient declined    Sexually Abused: Patient declined     Constitutional: Patient reports fatigue.  Denies fever, malaise, headache or abrupt weight changes.  HEENT: Denies eye pain, eye redness, ear pain, ringing in the ears, wax buildup, runny  nose, nasal congestion, bloody nose, or sore throat. Respiratory: Denies difficulty breathing, shortness of breath, cough or sputum production.   Cardiovascular: Denies chest pain, chest tightness, palpitations or swelling in the hands or feet.  Gastrointestinal: Denies abdominal pain, bloating, constipation, diarrhea or blood in the stool.  GU: Denies urgency, frequency, pain with urination, burning sensation, blood in urine, odor or discharge. Musculoskeletal: Patient reports chronic joint pain.  Denies decrease in range of motion, difficulty with gait, muscle pain or joint swelling.  Skin: Denies redness, rashes, lesions or ulcercations.  Neurological: Patient reports insomnia.  Denies dizziness, difficulty with memory, difficulty with speech or problems with balance and coordination.  Psych: Patient has a history of depression.  Denies anxiety, SI/HI.  No other specific complaints in a complete review of systems (except as listed in HPI above).  Observations/Objective:  Wt Readings from Last 3 Encounters:  03/07/23 (!) 339 lb 9.6 oz (154 kg)  02/10/23 (!) 340 lb 8 oz (154.4 kg)  01/18/23 (!) 339 lb 6 oz (153.9 kg)    General: Appears his stated age, obese, in NAD. Skin: Warm, dry and intact. No rashes, lesions or ulcerations noted. Pulmonary/Chest: Normal effort. No respiratory distress.  Neurological: Alert and oriented.   BMET    Component Value Date/Time   NA 136 11/30/2022 0951   K 3.9 11/30/2022 0951   CL 95 (L) 11/30/2022 0951   CO2 21 11/30/2022 0951   GLUCOSE 226 (H) 11/30/2022 0951   GLUCOSE 197 (H) 08/04/2022 1149   BUN 14 11/30/2022 0951   CREATININE 1.23 11/30/2022 0951   CREATININE 1.15 08/04/2022 1149   CALCIUM  9.4 11/30/2022 0951   GFRNONAA 69 08/05/2020 0955   GFRAA 79 08/05/2020 0955    Lipid Panel     Component Value Date/Time   CHOL 104 02/10/2023 0833   TRIG 211 (H) 02/10/2023 0833   HDL 34 (L) 02/10/2023 0833   CHOLHDL 3.1 02/10/2023 0833    VLDL 28.0 01/23/2020 1413   LDLCALC 42 02/10/2023 0833    CBC  Component Value Date/Time   WBC 5.5 11/30/2022 0951   WBC 6.0 08/04/2022 1149   RBC 5.69 11/30/2022 0951   RBC 5.55 08/04/2022 1149   HGB 15.0 11/30/2022 0951   HCT 47.7 11/30/2022 0951   PLT 217 11/30/2022 0951   MCV 84 11/30/2022 0951   MCH 26.4 (L) 11/30/2022 0951   MCH 26.3 (L) 08/04/2022 1149   MCHC 31.4 (L) 11/30/2022 0951   MCHC 32.4 08/04/2022 1149   RDW 14.7 11/30/2022 0951   LYMPHSABS 1.3 04/15/2021 1024   EOSABS 0.1 04/15/2021 1024   BASOSABS 0.1 04/15/2021 1024    Hgb A1C Lab Results  Component Value Date   HGBA1C 7.3 (H) 02/10/2023       Assessment and Plan:  Assessment and Plan    Atrial Fibrillation Plan to start Tikosyn , which requires a 3-day hospital stay. Noted potential drug interactions with current medications (Prozac and Lamotrigine). -Wean off Prozac over 4 weeks:1 every other day for 2 weeks, then twice a week for 2 weeks, then stop. -Wean off Lamotrigine over 4 weeks:Half a tablet daily for 2 weeks, then half a tablet every other day for a week, then half a tablet twice in the final week, then stop. -Increase Wellbutrin  to 150 mg SR BID. -Consider adding duloxetine  60 mg daily to help with mood/chronic pain  Chronic Pain Currently managed with Celebrex , but concerns raised about potential increased risk of bleeding while on Eliquis . -Consider reducing Celebrex  to once daily and supplementing with Tylenol . -Consult with cardiologist regarding continued use of Celebrex  while on Eliquis . -Plan to discontinue Eliquis  as soon as possible after starting Tikosyn  to allow resumption of Celebrex .  Mental Health Long-term use of Prozac and Lamotrigine for mood stabilization. Noted potential for mood changes or suicidal thoughts during weaning process. -Monitor closely for mood changes or suicidal thoughts during weaning process. -Consider alternative mood stabilizing medications if  necessary.       RTC in 5 months for annual exam  Follow Up Instructions:    I discussed the assessment and treatment plan with the patient. The patient was provided an opportunity to ask questions and all were answered. The patient agreed with the plan and demonstrated an understanding of the instructions.   The patient was advised to call back or seek an in-person evaluation if the symptoms worsen or if the condition fails to improve as anticipated.    Angeline Laura, NP

## 2023-03-14 NOTE — Patient Instructions (Signed)
 Dofetilide  Capsules What is this medication? DOFETILIDE  (doe FET il ide) treats a fast or irregular heartbeat (arrhythmia). It works by slowing down overactive electric signals in the heart, which stabilizes your heart rhythm. It belongs to a group of medications called antiarrhythmics. This medicine may be used for other purposes; ask your health care provider or pharmacist if you have questions. COMMON BRAND NAME(S): Tikosyn  What should I tell my care team before I take this medication? They need to know if you have any of these conditions: Heart disease History of irregular heartbeat History of low levels of potassium or magnesium in the blood Kidney disease Liver disease An unusual or allergic reaction to dofetilide , other medications, foods, dyes, or preservatives Pregnant or trying to get pregnant Breast-feeding How should I use this medication? Take this medication by mouth with a glass of water. Follow the directions on the prescription label. Do not take with grapefruit juice. You can take it with or without food. If it upsets your stomach, take it with food. Take your medication at regular intervals. Do not take it more often than directed. Do not stop taking except on your care team's advice. A special MedGuide will be given to you by the pharmacist with each prescription and refill. Be sure to read this information carefully each time. Talk to your care team about the use of this medication in children. Special care may be needed. Overdosage: If you think you have taken too much of this medicine contact a poison control center or emergency room at once. NOTE: This medicine is only for you. Do not share this medicine with others. What if I miss a dose? If you miss a dose, skip it. Take your next dose at the normal time. Do not take extra or 2 doses at the same time to make up for the missed dose. What may interact with this medication? Do not take this medication with any of the  following: Cimetidine Cisapride Dolutegravir Dronedarone Erdafitinib Hydrochlorothiazide  Ketoconazole Megestrol Pimozide Prochlorperazine Thioridazine Trimethoprim Verapamil This medication may also interact with the following: Amiloride Cannabinoids Certain antibiotics like erythromycin or clarithromycin Certain antiviral medications for HIV or hepatitis Certain medications for depression, anxiety, or psychotic disorders Digoxin Diltiazem  Grapefruit juice Metformin  Nefazodone Other medications that prolong the QT interval (an abnormal heart rhythm) Quinine Triamterene Zafirlukast Ziprasidone This list may not describe all possible interactions. Give your health care provider a list of all the medicines, herbs, non-prescription drugs, or dietary supplements you use. Also tell them if you smoke, drink alcohol, or use illegal drugs. Some items may interact with your medicine. What should I watch for while using this medication? Your condition will be monitored carefully while you are receiving this medication. What side effects may I notice from receiving this medication? Side effects that you should report to your care team as soon as possible: Allergic reactions--skin rash, itching, hives, swelling of the face, lips, tongue, or throat Chest pain Heart rhythm changes--fast or irregular heartbeat, dizziness, feeling faint or lightheaded, chest pain, trouble breathing Side effects that usually do not require medical attention (report to your care team if they continue or are bothersome): Dizziness Headache Nausea Stomach pain Trouble sleeping This list may not describe all possible side effects. Call your doctor for medical advice about side effects. You may report side effects to FDA at 1-800-FDA-1088. Where should I keep my medication? Keep out of the reach of children. Store at room temperature between 15 and 30 degrees C (59 and  86 degrees F). Throw away any unused  medication after the expiration date. NOTE: This sheet is a summary. It may not cover all possible information. If you have questions about this medicine, talk to your doctor, pharmacist, or health care provider.  2024 Elsevier/Gold Standard (2021-01-16 00:00:00)

## 2023-03-14 NOTE — Telephone Encounter (Signed)
No answer/No voicemail box has been set up. 

## 2023-03-15 NOTE — Telephone Encounter (Signed)
 Requested medications are due for refill today.  unsure  Requested medications are on the active medications list.  yes  Last refill. 02/10/2023  Future visit scheduled.   yes  Notes to clinic.  Medication is historical    Requested Prescriptions  Pending Prescriptions Disp Refills   Continuous Glucose Sensor (DEXCOM G7 SENSOR) MISC [Pharmacy Med Name: DEXCOM G7 SENSOR SENSOR MISC]  0    Sig: APPLY ONE SENSOR TRANSDERMALLY EVERY TEN DAYS     Endocrinology: Diabetes - Testing Supplies Passed - 03/15/2023  1:50 PM      Passed - Valid encounter within last 12 months    Recent Outpatient Visits           Yesterday Longstanding persistent atrial fibrillation [facility])   [facility] [place], [place] W, NP   1 month ago Type 2 diabetes mellitus with foot ulcer, without long-term current use of insulin  [facility])   [facility] [place], [place] W, NP   3 months ago Type 2 diabetes mellitus with foot ulcer, without long-term current use of insulin  [facility])   [facility] [place], [place] W, NP   4 months ago Type 2 diabetes mellitus with foot ulcer, without long-term current use of insulin  [facility])   [place] [facility] [place], Angeline ORN, NP   4 months ago Reactive depression   [facility] [place], Angeline ORN, NP       Future Appointments             In 3 weeks Jude Harden GAILS, MD [facility] at [facility], [facility]   In 5 months Baity, Angeline ORN, NP  [facility], [facility]

## 2023-03-15 NOTE — Telephone Encounter (Signed)
-  Wean off Prozac over 4 weeks:1 every other day for 2 weeks, then twice a week for 2 weeks, then stop. -Wean off Lamotrigine over 4 weeks:Half a tablet daily for 2 weeks, then half a tablet every other day for a week, then half a tablet twice in the final week, then stop. -Increase Wellbutrin  to 150 mg SR BID. -Consider adding duloxetine  60 mg daily to help with mood/chronic pai

## 2023-03-15 NOTE — Telephone Encounter (Signed)
 I called and spoke with Lynden Ang (ok per DPR). She advised she has spoken with the patient's PCP and her question has been answered. She does not need anything further from [place] at this time.

## 2023-03-18 ENCOUNTER — Telehealth (HOSPITAL_COMMUNITY): Payer: Self-pay

## 2023-03-18 NOTE — Telephone Encounter (Signed)
Initiated prior authorization to [facility] for Tikosyn admission.  Date of service: 04/12/2023 Pending Reference # [id number] Fax # [phone] Faxed clinical information to [employer].

## 2023-03-21 ENCOUNTER — Other Ambulatory Visit: Payer: Self-pay | Admitting: Internal Medicine

## 2023-03-21 NOTE — Telephone Encounter (Signed)
Requested Prescriptions  Pending Prescriptions Disp Refills   glipiZIDE (GLUCOTROL) 10 MG tablet [Pharmacy Med Name: GLIPIZIDE 10MG  TABLET] 180 tablet 1    Sig: TAKE ONE TABLET BY MOUTH TWICE A DAY BEFORE A MEAL.     Endocrinology:  Diabetes - Sulfonylureas Passed - 03/21/2023  3:26 PM      Passed - HBA1C is between 0 and 7.9 and within 180 days    Hemoglobin A1C  Date Value Ref Range Status  02/10/2023 6.3 (A) 4.0 - 5.6 % Final   HbA1c POC (<> result, manual entry)  Date Value Ref Range Status  12/09/2021 10.3 4.0 - 5.6 % Final   Hgb A1c MFr Bld  Date Value Ref Range Status  02/10/2023 7.3 (H) <5.7 % of total Hgb Final    Comment:    For someone without known diabetes, a hemoglobin A1c value of 6.5% or greater indicates that they may have  diabetes and this should be confirmed with a follow-up  test. . For someone with known diabetes, a value <7% indicates  that their diabetes is well controlled and a value  greater than or equal to 7% indicates suboptimal  control. A1c targets should be individualized based on  duration of diabetes, age, comorbid conditions, and  other considerations. . Currently, no consensus exists regarding use of hemoglobin A1c for diagnosis of diabetes for children. .          Passed - Cr in normal range and within 360 days    Creat  Date Value Ref Range Status  08/04/2022 1.15 0.70 - 1.30 mg/dL Final   Creatinine, Ser  Date Value Ref Range Status  11/30/2022 1.23 0.76 - 1.27 mg/dL Final   Creatinine, Urine  Date Value Ref Range Status  08/04/2022 71 20 - 320 mg/dL Final         Passed - Valid encounter within last 6 months    Recent Outpatient Visits           1 week ago Longstanding persistent atrial fibrillation [facility])   [place] [facility] [place], [place] W, NP   1 month ago Type 2 diabetes mellitus with foot ulcer, without long-term current use of insulin [facility])   [place] [facility] [place],  [place] W, NP   3 months ago Type 2 diabetes mellitus with foot ulcer, without long-term current use of insulin [facility])   [place] [facility] [place], [place] W, NP   4 months ago Type 2 diabetes mellitus with foot ulcer, without long-term current use of insulin [facility])   [place] [facility] [place], Salvadore [place], NP   5 months ago Reactive depression   [place] [facility], Salvadore [place], NP       Future Appointments             In 2 weeks Oretha Milch, MD [facility] Pul[facility] at [facility], [facility]   In 4 months Baity, Salvadore [place], NP [place] [facility], [facility]

## 2023-03-25 ENCOUNTER — Telehealth (HOSPITAL_COMMUNITY): Payer: Self-pay

## 2023-03-25 NOTE — Telephone Encounter (Signed)
Tikosyn admission has been approved. Date of service: 04/12/2023 Authorization # [id number]

## 2023-03-28 ENCOUNTER — Telehealth: Payer: Self-pay | Admitting: Internal Medicine

## 2023-03-28 ENCOUNTER — Other Ambulatory Visit: Payer: Self-pay | Admitting: Internal Medicine

## 2023-03-28 MED ORDER — BUPROPION HCL ER (SR) 150 MG PO TB12: 150.0000 mg | ORAL_TABLET | Freq: Two times a day (BID) | ORAL | 1 refills | Status: DC

## 2023-03-28 NOTE — Telephone Encounter (Signed)
RX sent to pharmacy

## 2023-03-28 NOTE — Telephone Encounter (Signed)
Pts wife Lynden Ang is calling to report that dosage had been changed for buPROPion (WELLBUTRIN SR) 150 MG 12 hr tablet [[id number]] for 2 times a day. Pharmacy does not have the updated script Please submit to the pharmacy. [facility] - [place], [place] - [address] Phone: [phone]  Fax: [phone]

## 2023-04-05 ENCOUNTER — Encounter (HOSPITAL_BASED_OUTPATIENT_CLINIC_OR_DEPARTMENT_OTHER): Payer: Self-pay | Admitting: Pulmonary Disease

## 2023-04-05 ENCOUNTER — Ambulatory Visit (HOSPITAL_BASED_OUTPATIENT_CLINIC_OR_DEPARTMENT_OTHER): Payer: 59 | Admitting: Pulmonary Disease

## 2023-04-05 VITALS — BP 142/80 | HR 60 | Ht 72.0 in | Wt 330.7 lb

## 2023-04-05 DIAGNOSIS — G4733 Obstructive sleep apnea (adult) (pediatric): Secondary | ICD-10-CM

## 2023-04-05 NOTE — Progress Notes (Signed)
 Subjective:    Patient ID: Anthony Castillo, male    DOB: [birth year], 58 y.o.   MRN: [id number]  HPI  58 yo obese former for FU of OSA   PMH : HFpEF 09/30/2022 new onset atrial fibrillation.  Echo 9//24 showed normal LV function and normal RV function.    Discussed the use of AI scribe software for clinical note transcription with the patient, who gave verbal consent to proceed.  History of Present Illness   The patient, with a history of sleep apnea, atrial fibrillation, and heart failure, presents for a follow-up visit. He has recently gained about forty pounds, increasing from two ninety to three thirty. He has been using a new auto CPAP device, which he reports is working well and is much better than the previous one. The patient uses a nose pillow mask and reports occasional dry mouth upon waking. He still wakes up during the night, but snoring has improved. The patient is also dealing with atrial fibrillation and is scheduled to be hospitalized for tikosyn  adjustment. He reports shortness of breath, which he attributes to the atrial fibrillation. He is currently taking twenty mgs of Lasix  daily for fluid buildup, which he reports has improved.       CPAP download shows excellent control of her events on auto settings 5 to 15 cm with average pressure of 13.4 and maximum pressure of 14 cm.  He has a mild leak.  He is very compliant more than 6 hours per night.  CPAP is certainly helped improve his daytime somnolence and fatigue  Significant tests/ events reviewed 01/2023 HST showed mod OSA with AHI 20/ hr    04/2003 NPSG >>wt 290 lbs , TST 340 minutes, AHI 10/hour, low saturation 79%   01/2008 PFTs normal spirometry, DLCO 76%. 04/2018 CT coronaries clear lungs, calcium  score 103  Review of Systems neg for any significant sore throat, dysphagia, itching, sneezing, nasal congestion or excess/ purulent secretions, fever, chills, sweats, unintended wt loss, pleuritic or exertional cp,  hempoptysis, orthopnea pnd or change in chronic leg swelling. Also denies presyncope, palpitations, heartburn, abdominal pain, nausea, vomiting, diarrhea or change in bowel or urinary habits, dysuria,hematuria, rash, arthralgias, visual complaints, headache, numbness weakness or ataxia.     Objective:   Physical Exam  Gen. Pleasant, obese, in no distress ENT - no lesions, no post nasal drip Neck: No JVD, no thyromegaly, no carotid bruits Lungs: no use of accessory muscles, no dullness to percussion, decreased without rales or rhonchi  Cardiovascular: Rhythm regular, heart sounds  normal, no murmurs or gallops, no peripheral edema Musculoskeletal: No deformities, no cyanosis or clubbing , no tremors       Assessment & Plan:    Assessment and Plan    Obstructive Sleep Apnea Improved compliance with new auto CPAP device. Previous issues with old machine resolved. Current settings: auto 5-15 cm H2O, pressures 13-14 cm H2O. Mild leak noted but not significant. Reports weight loss since last visit. Sleep study: apneas reduced from 20 to 1 per hour with CPAP use, within acceptable limits (<5). - Continue current CPAP settings - Monitor weight and adjust CPAP settings if necessary - Follow up in six months  Atrial Fibrillation Scheduled for hospitalization next week for Tikosyn  initiation. Currently on anticoagulants, Cardizem , and metoprolol . Shortness of breath and fluid buildup improving, likely due to AFib management. Treating sleep apnea reduces cardiac stress and improves rhythm control. - Initiate Tikosyn  during upcoming hospitalization - Continue current medications: anticoagulants, Cardizem , metoprolol ,  and Lasix  20 mg daily - Monitor for symptoms of dyspnea and fluid buildup  General Health Maintenance  - Ensure annual supply of CPAP equipment is maintained  Follow-up - Follow up in six months.

## 2023-04-05 NOTE — Patient Instructions (Addendum)
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

## 2023-04-07 ENCOUNTER — Other Ambulatory Visit: Payer: Self-pay | Admitting: Internal Medicine

## 2023-04-08 ENCOUNTER — Encounter (HOSPITAL_COMMUNITY): Payer: Self-pay

## 2023-04-08 NOTE — Telephone Encounter (Signed)
 Requested Prescriptions  Pending Prescriptions Disp Refills   tamsulosin  (FLOMAX ) 0.4 MG CAPS capsule [Pharmacy Med Name: TAMSULOSIN  HYDROCHLORIDE 0.4MG  CAPSULE] 90 capsule 1    Sig: TAKE ONE CAPSULE BY MOUTH ONCE DAILY     Urology: Alpha-Adrenergic Blocker Failed - 04/08/2023  9:50 AM      Failed - Last BP in normal range    BP Readings from Last 1 Encounters:  04/05/23 (!) 142/80         Passed - PSA in normal range and within 360 days    PSA  Date Value Ref Range Status  08/04/2022 0.57 < OR = 4.00 ng/mL Final    Comment:    The total PSA value from this assay system is  standardized against the WHO standard. The test  result will be approximately 20% lower when compared  to the equimolar-standardized total PSA (Beckman  Coulter). Comparison of serial PSA results should be  interpreted with this fact in mind. . This test was performed using the Siemens  chemiluminescent method. Values obtained from  different assay methods cannot be used interchangeably. PSA levels, regardless of value, should not be interpreted as absolute evidence of the presence or absence of disease.          Passed - Valid encounter within last 12 months    Recent Outpatient Visits           3 weeks ago Longstanding persistent atrial fibrillation [facility])   [place] [facility] [place], [place] W, NP   1 month ago Type 2 diabetes mellitus with foot ulcer, without long-term current use of insulin  [facility])   [place] [facility] [place], [place] W, NP   4 months ago Type 2 diabetes mellitus with foot ulcer, without long-term current use of insulin  [facility])   [place] [facility] [place], [place], NP   5 months ago Type 2 diabetes mellitus with foot ulcer, without long-term current use of insulin  [facility])   [place] [facility] [place], Angeline ORN, NP   5 months ago Reactive depression   [place] [facility] [place], Angeline ORN, NP        Future Appointments             In 4 months Baity, Angeline ORN, NP [place] [facility] [place], [facility]

## 2023-04-11 DIAGNOSIS — G4733 Obstructive sleep apnea (adult) (pediatric): Secondary | ICD-10-CM | POA: Diagnosis not present

## 2023-04-12 ENCOUNTER — Other Ambulatory Visit: Payer: Self-pay | Admitting: Cardiovascular Disease

## 2023-04-12 ENCOUNTER — Other Ambulatory Visit (HOSPITAL_COMMUNITY): Payer: Self-pay

## 2023-04-12 ENCOUNTER — Ambulatory Visit (HOSPITAL_COMMUNITY)
Admission: RE | Admit: 2023-04-12 | Discharge: 2023-04-12 | Disposition: A | Discharge status: Home / Self Care | Payer: 59 | Source: Ambulatory Visit | Attending: Internal Medicine | Admitting: Internal Medicine

## 2023-04-12 ENCOUNTER — Other Ambulatory Visit: Payer: Self-pay

## 2023-04-12 ENCOUNTER — Telehealth (HOSPITAL_COMMUNITY): Payer: Self-pay | Admitting: Pharmacy Technician

## 2023-04-12 ENCOUNTER — Encounter (HOSPITAL_COMMUNITY): Payer: Self-pay | Admitting: Cardiology

## 2023-04-12 ENCOUNTER — Inpatient Hospital Stay (HOSPITAL_COMMUNITY)
Admission: AD | Admit: 2023-04-12 | Discharge: 2023-04-15 | DRG: 309 | Disposition: A | Discharge status: Home / Self Care | Payer: 59 | Source: Ambulatory Visit | Attending: Cardiology | Admitting: Cardiology

## 2023-04-12 VITALS — BP 150/86 | HR 83 | Ht 72.0 in | Wt 328.6 lb

## 2023-04-12 DIAGNOSIS — I2510 Atherosclerotic heart disease of native coronary artery without angina pectoris: Secondary | ICD-10-CM | POA: Insufficient documentation

## 2023-04-12 DIAGNOSIS — Z880 Allergy status to penicillin: Secondary | ICD-10-CM | POA: Diagnosis not present

## 2023-04-12 DIAGNOSIS — Z79899 Other long term (current) drug therapy: Secondary | ICD-10-CM

## 2023-04-12 DIAGNOSIS — I4819 Other persistent atrial fibrillation: Secondary | ICD-10-CM | POA: Insufficient documentation

## 2023-04-12 DIAGNOSIS — Z7985 Long-term (current) use of injectable non-insulin antidiabetic drugs: Secondary | ICD-10-CM | POA: Diagnosis not present

## 2023-04-12 DIAGNOSIS — Z791 Long term (current) use of non-steroidal anti-inflammatories (NSAID): Secondary | ICD-10-CM | POA: Diagnosis not present

## 2023-04-12 DIAGNOSIS — G4733 Obstructive sleep apnea (adult) (pediatric): Secondary | ICD-10-CM | POA: Insufficient documentation

## 2023-04-12 DIAGNOSIS — I5030 Unspecified diastolic (congestive) heart failure: Secondary | ICD-10-CM | POA: Insufficient documentation

## 2023-04-12 DIAGNOSIS — I110 Hypertensive heart disease with heart failure: Secondary | ICD-10-CM | POA: Insufficient documentation

## 2023-04-12 DIAGNOSIS — I5032 Chronic diastolic (congestive) heart failure: Secondary | ICD-10-CM | POA: Diagnosis not present

## 2023-04-12 DIAGNOSIS — Z7984 Long term (current) use of oral hypoglycemic drugs: Secondary | ICD-10-CM | POA: Insufficient documentation

## 2023-04-12 DIAGNOSIS — Z7901 Long term (current) use of anticoagulants: Secondary | ICD-10-CM | POA: Insufficient documentation

## 2023-04-12 DIAGNOSIS — D6869 Other thrombophilia: Secondary | ICD-10-CM | POA: Insufficient documentation

## 2023-04-12 DIAGNOSIS — E785 Hyperlipidemia, unspecified: Secondary | ICD-10-CM | POA: Diagnosis not present

## 2023-04-12 DIAGNOSIS — Z6841 Body Mass Index (BMI) 40.0 and over, adult: Secondary | ICD-10-CM | POA: Diagnosis not present

## 2023-04-12 DIAGNOSIS — I4891 Unspecified atrial fibrillation: Secondary | ICD-10-CM | POA: Diagnosis not present

## 2023-04-12 DIAGNOSIS — E119 Type 2 diabetes mellitus without complications: Secondary | ICD-10-CM | POA: Diagnosis present

## 2023-04-12 DIAGNOSIS — I10 Essential (primary) hypertension: Secondary | ICD-10-CM | POA: Diagnosis not present

## 2023-04-12 LAB — BASIC METABOLIC PANEL
Anion gap: 5 (ref 5–15)
BUN: 11 mg/dL (ref 6–20)
CO2: 25 mmol/L (ref 22–32)
Calcium: 8.5 mg/dL — ABNORMAL LOW (ref 8.9–10.3)
Chloride: 103 mmol/L (ref 98–111)
Creatinine, Ser: 1.27 mg/dL — ABNORMAL HIGH (ref 0.61–1.24)
GFR, Estimated: >60 mL/min (ref >60)
Glucose, Bld: 119 mg/dL — ABNORMAL HIGH (ref 70–99)
Potassium: 3.9 mmol/L (ref 3.5–5.1)
Sodium: 133 mmol/L — ABNORMAL LOW (ref 135–145)

## 2023-04-12 LAB — MAGNESIUM: Magnesium: 2.2 mg/dL (ref 1.7–2.4)

## 2023-04-12 LAB — HIV ANTIBODY (ROUTINE TESTING W REFLEX): HIV Screen 4th Generation wRfx: NONREACTIVE

## 2023-04-12 MED ORDER — ACETAMINOPHEN 500 MG PO TABS
1000.0000 mg | ORAL_TABLET | Freq: Every day | ORAL | Status: DC
  Administered 2023-04-12 – 2023-04-14 (×3): 1000 mg via ORAL
  Filled 2023-04-12 (×3): qty 2

## 2023-04-12 MED ORDER — SODIUM CHLORIDE 0.9% FLUSH
3.0000 mL | Freq: Two times a day (BID) | INTRAVENOUS | Status: DC
  Administered 2023-04-12 – 2023-04-15 (×7): 3 mL via INTRAVENOUS

## 2023-04-12 MED ORDER — EZETIMIBE 10 MG PO TABS
10.0000 mg | ORAL_TABLET | Freq: Every day | ORAL | Status: DC
  Administered 2023-04-12 – 2023-04-14 (×3): 10 mg via ORAL
  Filled 2023-04-12 (×3): qty 1

## 2023-04-12 MED ORDER — DOFETILIDE 500 MCG PO CAPS
500.0000 ug | ORAL_CAPSULE | Freq: Two times a day (BID) | ORAL | Status: DC
  Administered 2023-04-12 – 2023-04-15 (×6): 500 ug via ORAL
  Filled 2023-04-12 (×6): qty 1

## 2023-04-12 MED ORDER — ROSUVASTATIN CALCIUM 5 MG PO TABS
10.0000 mg | ORAL_TABLET | Freq: Every day | ORAL | Status: DC
  Administered 2023-04-12 – 2023-04-14 (×3): 10 mg via ORAL
  Filled 2023-04-12 (×3): qty 2

## 2023-04-12 MED ORDER — POTASSIUM CHLORIDE CRYS ER 20 MEQ PO TBCR
20.0000 meq | EXTENDED_RELEASE_TABLET | Freq: Once | ORAL | Status: AC
  Administered 2023-04-12: 20 meq via ORAL
  Filled 2023-04-12: qty 1

## 2023-04-12 MED ORDER — SODIUM CHLORIDE 0.9% FLUSH: 3.0000 mL | INTRAVENOUS | Status: DC | PRN

## 2023-04-12 MED ORDER — GLIPIZIDE 10 MG PO TABS
10.0000 mg | ORAL_TABLET | Freq: Two times a day (BID) | ORAL | Status: DC
  Administered 2023-04-12 – 2023-04-15 (×6): 10 mg via ORAL
  Filled 2023-04-12 (×7): qty 1

## 2023-04-12 MED ORDER — DULOXETINE HCL 30 MG PO CPEP
30.0000 mg | ORAL_CAPSULE | Freq: Two times a day (BID) | ORAL | Status: DC
  Administered 2023-04-12 – 2023-04-15 (×6): 30 mg via ORAL
  Filled 2023-04-12 (×6): qty 1

## 2023-04-12 MED ORDER — FUROSEMIDE 20 MG PO TABS
20.0000 mg | ORAL_TABLET | Freq: Every day | ORAL | Status: DC
  Administered 2023-04-13 – 2023-04-15 (×3): 20 mg via ORAL
  Filled 2023-04-12 (×3): qty 1

## 2023-04-12 MED ORDER — TAMSULOSIN HCL 0.4 MG PO CAPS
0.4000 mg | ORAL_CAPSULE | Freq: Every day | ORAL | Status: DC
  Administered 2023-04-13 – 2023-04-14 (×2): 0.4 mg via ORAL
  Filled 2023-04-12 (×3): qty 1

## 2023-04-12 MED ORDER — POTASSIUM CHLORIDE ER 10 MEQ PO TBCR
10.0000 meq | EXTENDED_RELEASE_TABLET | Freq: Every day | ORAL | Status: DC
Start: 2023-04-12 — End: 2023-04-14
  Administered 2023-04-12 – 2023-04-13 (×2): 10 meq via ORAL
  Filled 2023-04-12 (×4): qty 1

## 2023-04-12 MED ORDER — BUPROPION HCL ER (SR) 150 MG PO TB12
150.0000 mg | ORAL_TABLET | Freq: Two times a day (BID) | ORAL | Status: DC
  Administered 2023-04-12 – 2023-04-15 (×6): 150 mg via ORAL
  Filled 2023-04-12 (×7): qty 1

## 2023-04-12 MED ORDER — SODIUM CHLORIDE 0.9 % IV SOLN: 250.0000 mL | INTRAVENOUS | Status: AC | PRN

## 2023-04-12 MED ORDER — LISINOPRIL 10 MG PO TABS
10.0000 mg | ORAL_TABLET | Freq: Every day | ORAL | Status: DC
  Administered 2023-04-13 – 2023-04-15 (×3): 10 mg via ORAL
  Filled 2023-04-12 (×3): qty 1

## 2023-04-12 MED ORDER — CELECOXIB 100 MG PO CAPS
100.0000 mg | ORAL_CAPSULE | Freq: Every morning | ORAL | Status: DC
  Administered 2023-04-13 – 2023-04-15 (×3): 100 mg via ORAL
  Filled 2023-04-12 (×3): qty 1

## 2023-04-12 MED ORDER — METOPROLOL SUCCINATE ER 50 MG PO TB24
50.0000 mg | ORAL_TABLET | Freq: Every day | ORAL | Status: DC
  Administered 2023-04-13 – 2023-04-15 (×3): 50 mg via ORAL
  Filled 2023-04-12 (×3): qty 1

## 2023-04-12 MED ORDER — PANTOPRAZOLE SODIUM 40 MG PO TBEC
40.0000 mg | DELAYED_RELEASE_TABLET | Freq: Every day | ORAL | Status: DC
  Administered 2023-04-12 – 2023-04-14 (×3): 40 mg via ORAL
  Filled 2023-04-12 (×3): qty 1

## 2023-04-12 MED ORDER — DILTIAZEM HCL ER COATED BEADS 180 MG PO CP24
180.0000 mg | ORAL_CAPSULE | Freq: Every day | ORAL | Status: DC
Start: 2023-04-13 — End: 2023-04-15
  Administered 2023-04-13 – 2023-04-15 (×3): 180 mg via ORAL
  Filled 2023-04-12 (×3): qty 1

## 2023-04-12 MED ORDER — LORATADINE 10 MG PO TABS
10.0000 mg | ORAL_TABLET | Freq: Every day | ORAL | Status: DC
  Administered 2023-04-13 – 2023-04-15 (×3): 10 mg via ORAL
  Filled 2023-04-12 (×3): qty 1

## 2023-04-12 MED ORDER — ALBUTEROL SULFATE HFA 108 (90 BASE) MCG/ACT IN AERS: 2.0000 | INHALATION_SPRAY | Freq: Four times a day (QID) | RESPIRATORY_TRACT | Status: DC | PRN

## 2023-04-12 MED ORDER — APIXABAN 5 MG PO TABS
5.0000 mg | ORAL_TABLET | Freq: Two times a day (BID) | ORAL | Status: DC
  Administered 2023-04-12 – 2023-04-15 (×6): 5 mg via ORAL
  Filled 2023-04-12 (×6): qty 1

## 2023-04-12 MED ORDER — LINAGLIPTIN 5 MG PO TABS
5.0000 mg | ORAL_TABLET | Freq: Every day | ORAL | Status: DC
  Administered 2023-04-12 – 2023-04-14 (×3): 5 mg via ORAL
  Filled 2023-04-12 (×3): qty 1

## 2023-04-12 MED ORDER — ALBUTEROL SULFATE (2.5 MG/3ML) 0.083% IN NEBU: 2.5000 mg | INHALATION_SOLUTION | Freq: Four times a day (QID) | RESPIRATORY_TRACT | Status: DC | PRN

## 2023-04-12 NOTE — Progress Notes (Addendum)
Primary Care Physician: Lorre Munroe, NP Primary Cardiologist: Julien Nordmann, MD Electrophysiologist: Nobie Putnam, MD     Referring Physician:      [name] is a 58 y.o. 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 Patient is here for Tikosyn admission for rhythm control. Patient is on Eliquis 5 mg BID for a CHADS2VASC score of 3.  On evaluation today, patient is here for Tikosyn admission. No benadryl use. He held [place] and Trulicity in anticipation of possible cardioversion. No missed doses of Eliquis 5 mg BID.   Today, he denies symptoms of palpitations, chest pain, shortness of breath, orthopnea, PND, lower extremity edema, dizziness, presyncope, syncope, snoring, daytime somnolence, bleeding, or neurologic sequela. The patient is tolerating medications without difficulties and is otherwise without complaint today.    Atrial Fibrillation Risk Factors:  he does have symptoms or diagnosis of sleep apnea. he is not compliant with CPAP therapy.  he has a BMI of Body mass index is 44.57 kg/m.Marland Kitchen Filed Weights   04/12/23 1026  Weight: (!) 149.1 kg    Current Outpatient Medications  Medication Sig Dispense Refill   Acetaminophen (TYLENOL ARTHRITIS PAIN PO) Take 1,300 mg by mouth at bedtime.     albuterol (VENTOLIN HFA) 108 (90 Base) MCG/ACT inhaler Inhale 2 puffs into the lungs every 6 (six) hours as needed for wheezing or shortness of breath. 8 g 6   apixaban (ELIQUIS) 5 MG TABS tablet Take 1 tablet (5 mg total) by mouth 2 (two) times daily. 180 tablet 3   buPROPion (WELLBUTRIN SR) 150 MG 12 hr tablet Take 1 tablet (150 mg total) by mouth 2 (two) times daily. 180 tablet 1   celecoxib (CELEBREX) 100 MG capsule TAKE ONE CAPSULE BY MOUTH TWICE DAILY (Patient taking differently: Take 100 mg by mouth every morning.) 180  capsule 0   cetirizine (ZYRTEC) 10 MG tablet Take 10 mg by mouth daily.     Cholecalciferol (VITAMIN D3) 1000 units CAPS Take 1 capsule by mouth daily.     Continuous Glucose Sensor (DEXCOM G7 SENSOR) MISC APPLY ONE SENSOR TRANSDERMALLY EVERY TEN DAYS 6 each 1   diltiazem (CARDIZEM CD) 180 MG 24 hr capsule Take 1 capsule (180 mg total) by mouth daily. 90 capsule 3   DULoxetine (CYMBALTA) 30 MG capsule Take 1 capsule (30 mg total) by mouth daily for 30 days, THEN 2 capsules (60 mg total) daily. (Patient taking differently: Taking 30 mg by mouth twice daily) 150 capsule 0   ezetimibe (ZETIA) 10 MG tablet TAKE ONE TABLET (10 MG TOTAL) BY MOUTH DAILY. 90 tablet 3   furosemide (LASIX) 20 MG tablet Take 1 tablet (20 mg total) by mouth daily. 90 tablet 3   glipiZIDE (GLUCOTROL) 10 MG tablet TAKE ONE TABLET BY MOUTH TWICE A DAY BEFORE A MEAL. 180 tablet 1   JANUVIA 100 MG tablet TAKE ONE TABLET BY MOUTH ONCE A DAY 90 tablet 0   lisinopril (ZESTRIL) 10 MG tablet Take 1 tablet (10 mg total) by mouth daily. 90 tablet 1   metoprolol succinate (TOPROL-XL) 50 MG 24 hr tablet Take 1 tablet (50 mg total) by mouth daily. Take with or immediately following a meal. 90 tablet 3   pantoprazole (PROTONIX) 40 MG tablet TAKE ONE TABLET BY MOUTH ONCE A DAY 90 tablet 2   potassium chloride (KLOR-CON) 10 MEQ  tablet TAKE ONE TABLET BY MOUTH ONCE A DAY 90 tablet 1   rosuvastatin (CRESTOR) 10 MG tablet TAKE ONE TABLET (10 MG TOTAL) BY MOUTH DAILY. 90 tablet 0   tamsulosin (FLOMAX) 0.4 MG CAPS capsule TAKE ONE CAPSULE BY MOUTH ONCE DAILY 90 capsule 1   empagliflozin (JARDIANCE) 25 MG TABS tablet Take 1 tablet (25 mg total) by mouth daily before breakfast. (Patient not taking: Reported on 04/12/2023) 90 tablet 1   TRULICITY 0.75 MG/0.5ML SOAJ INJECT 0.75 MG INTO THE SKIN ONCE A WEEK. (Patient not taking: Reported on 04/12/2023) 6 mL 0   No current facility-administered medications for this encounter.    Atrial Fibrillation  Management history:  Previous antiarrhythmic drugs: none Previous cardioversions: 12/02/22 Previous ablations: none Anticoagulation history: Eliquis   ROS- All systems are reviewed and negative except as per the HPI above.  Physical Exam: BP (!) 150/86   Pulse 83   Ht 6' (1.829 m)   Wt (!) 149.1 kg   BMI 44.57 kg/m   GEN: Well nourished, well developed in no acute distress NECK: No JVD; No carotid bruits CARDIAC: Irregularly irregular rate and rhythm, no murmurs, rubs, gallops RESPIRATORY:  Clear to auscultation without rales, wheezing or rhonchi  ABDOMEN: Soft, non-tender, non-distended EXTREMITIES:  No edema; No deformity   EKG today demonstrates  Vent. rate 83 BPM PR interval * ms QRS duration 92 ms QT/QTcB 364/427 ms P-R-T axes * 27 59 Atrial fibrillation Nonspecific T wave abnormality Abnormal ECG When compared with ECG of 07-Mar-2023 13:02, PREVIOUS ECG IS PRESENT  Echo 11/03/22 demonstrated  1. Left ventricular ejection fraction, by estimation, is 55 to 60%. The  left ventricle has normal function. The left ventricle has no regional  wall motion abnormalities. Left ventricular diastolic parameters are  consistent with Grade I diastolic  dysfunction (impaired relaxation).   2. Right ventricular systolic function is normal. The right ventricular  size is normal. Tricuspid regurgitation signal is inadequate for assessing  PA pressure.   3. Left atrial size was mildly dilated.   4. The mitral valve is normal in structure. No evidence of mitral valve  regurgitation. No evidence of mitral stenosis.   5. The aortic valve has an indeterminant number of cusps. Aortic valve  regurgitation is mild. No aortic stenosis is present.   6. There is borderline dilatation of the ascending aorta, measuring 39  mm.   7. The inferior vena cava is normal in size with greater than 50%  respiratory variability, suggesting right atrial pressure of 3 mmHg.   ASSESSMENT &  PLAN CHA2DS2-VASc Score = 3  The patient's score is based upon: CHF History: 0 HTN History: 1 Diabetes History: 1 Stroke History: 0 Vascular Disease History: 1 Age Score: 0 Gender Score: 0       ASSESSMENT AND PLAN: Persistent Atrial Fibrillation (ICD10:  I48.19) The patient's CHA2DS2-VASc score is 3, indicating a 3.2% annual risk of stroke.    He is currently in Afib.    Secondary Hypercoagulable State (ICD10:  D68.69) The patient is at significant risk for stroke/thromboembolism based upon his CHA2DS2-VASc Score of 3.  Continue Apixaban (Eliquis).  No missed doses.   Patient presents for dofetilide admission. Continue Eliquis, states no missed doses in the last 3 weeks. No recent benadryl use. PharmD has screened medications. QTc in SR 445 ms. Labs today show creatinine 1.27, K 3.9, mag 2.2, CrCl estimate 132 mL/min.   Patient will present to admissions and currently appears to have a bed  pending (6E45).   Lake Bells, PA-C  Afib Clinic [facility] [address] [place], [place] [address] [phone]

## 2023-04-12 NOTE — Progress Notes (Addendum)
Pharmacy: Dofetilide (Tikosyn) - Initial Consult Assessment and Electrolyte Replacement  Pharmacy consulted to assist in monitoring and replacing electrolytes in this 58 y.o. male admitted on 04/12/2023 undergoing dofetilide initiation.   Assessment:  Patient Exclusion Criteria: If any screening criteria checked as "Yes", then  patient  should NOT receive dofetilide until criteria item is corrected.  If "Yes" please indicate correction plan.  YES  NO Patient  Exclusion Criteria Correction Plan   [x]   []   Baseline QTc interval is greater than or equal to 440 msec. IF above YES box checked dofetilide contraindicated unless patient has ICD; then may proceed if QTc 500-550 msec or with known ventricular conduction abnormalities may proceed with QTc 550-600 msec. QTc = 445 Continue Tikosyn with close QTc monitoring   []   [x]   Patient is known or suspected to have a digoxin level greater than 2 ng/ml: No results found for: "DIGOXIN"     []   [x]   Creatinine clearance less than 20 ml/min (calculated using Cockcroft-Gault, actual body weight and serum creatinine): Estimated Creatinine Clearance: 93.6 mL/min (A) (by C-G formula based on SCr of 1.27 mg/dL (H)).     []   [x]  Patient has received drugs known to prolong the QT intervals within the last 48 hours (phenothiazines, tricyclics or tetracyclic antidepressants, erythromycin, H-1 antihistamines, cisapride, fluoroquinolones, azithromycin, ondansetron).   Updated information on QT prolonging agents is available to be searched on the following database:QT prolonging agents  He was recently weaned off Prozac and lamotrigine (per notes 1/14.2025)   []   [x]  Patient received a dose of a thiazide diuretic in the last 48 hours [including hydrochlorothiazide (Oretic) alone or in any combination including triamterene (Dyazide, Maxzide)].    []   [x]  Patient received a medication known to increase dofetilide plasma concentrations prior to initial  dofetilide dose:  Trimethoprim (Primsol, Proloprim) in the last 36 hours Verapamil (Calan, Verelan) in the last 36 hours or a sustained release dose in the last 72 hours Megestrol (Megace) in the last 5 days  Cimetidine (Tagamet) in the last 6 hours Ketoconazole (Nizoral) in the last 24 hours Itraconazole (Sporanox) in the last 48 hours  Prochlorperazine (Compazine) in the last 36 hours     []   [x]   Patient is known to have a history of torsades de pointes; congenital or acquired long QT syndromes.    []   [x]   Patient has received a Class 1 antiarrhythmic with less than 2 half-lives since last dose. (Disopyramide, Quinidine, Procainamide, Lidocaine, Mexiletine, Flecainide, Propafenone)    []   [x]   Patient has received amiodarone therapy in the past 3 months or amiodarone level is greater than 0.3 ng/ml.    Labs:    Component Value Date/Time   K 3.9 04/12/2023 1051   MG 2.2 04/12/2023 1051     Plan: Select One Calculated CrCl  Dose q12h  [x]  > 60 ml/min 500 mcg  []  40-60 ml/min 250 mcg  []  20-40 ml/min 125 mcg   [x]   Physician selected initial dose within range recommended for patients level of renal function - will monitor for response.  []   Physician selected initial dose outside of range recommended for patients level of renal function - will discuss if the dose should be altered at this time.   Patient has been appropriately anticoagulated with apixaban.  Potassium: K= 3.9: will give K dur , continue with Tikosyn as ordered  Magnesium: Mg >2: Appropriate to initiate Tikosyn, no replacement needed     Thank you for  allowing pharmacy to participate in this patient's care   Harland German, PharmD Clinical Pharmacist **Pharmacist phone directory can now be found on [email or web address] ([email or web address]).  Listed under [facility].

## 2023-04-12 NOTE — Telephone Encounter (Signed)
Patient Product/process development scientist completed.    The patient is insured through [employer]. Patient has [employer], may use a copay card, and/or apply for patient assistance if available.    Ran test claim for dofetilide (Tikosyn) 500 mcg and the current 30 day co-pay is $0.00.   This test claim was processed through [facility]- copay amounts may vary at other pharmacies due to pharmacy/plan contracts, or as the patient moves through the different stages of their insurance plan.     Roland Earl, CPHT Pharmacy Technician III Certified Patient Advocate [facility] Patient Advocate Team Direct Number: [phone]  Fax: [phone]

## 2023-04-12 NOTE — H&P (Addendum)
Primary Care Physician: Lorre Munroe, NP Primary Cardiologist: Julien Nordmann, MD Electrophysiologist: Nobie Putnam, MD     Referring Physician:      [name] is a 58 y.o. 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 Patient is here for Tikosyn admission for rhythm control. Patient is on Eliquis 5 mg BID for a CHADS2VASC score of 3.  On evaluation today, patient is here for Tikosyn admission. No benadryl use. He held [place] and Trulicity in anticipation of possible cardioversion. No missed doses of Eliquis 5 mg BID.   Today, he denies symptoms of palpitations, chest pain, shortness of breath, orthopnea, PND, lower extremity edema, dizziness, presyncope, syncope, snoring, daytime somnolence, bleeding, or neurologic sequela. The patient is tolerating medications without difficulties and is otherwise without complaint today.    Atrial Fibrillation Risk Factors:  he does have symptoms or diagnosis of sleep apnea. he is not compliant with CPAP therapy.  he has a BMI of Body mass index is 44.23 kg/m.Marland Kitchen Filed Weights   04/12/23 1248  Weight: (!) 147.9 kg    Current Facility-Administered Medications  Medication Dose Route Frequency Provider Last Rate Last Admin   0.9 %  sodium chloride infusion  250 mL Intravenous PRN Camnitz, Andree Coss, MD       acetaminophen (TYLENOL) tablet 1,000 mg  1,000 mg Oral QHS Eustace Pen, PA-C       albuterol (PROVENTIL) (2.5 MG/3ML) 0.083% nebulizer solution 2.5 mg  2.5 mg Nebulization Q6H PRN Camnitz, Andree Coss, MD       apixaban Everlene Balls) tablet 5 mg  5 mg Oral BID Regan Lemming, MD       buPROPion [facility] SR) 12 hr tablet 150 mg  150 mg Oral BID Eustace Pen, PA-C       [START ON 04/13/2023] celecoxib (CELEBREX) capsule 100 mg  100 mg Oral q morning Eustace Pen,  PA-C       [START ON 04/13/2023] diltiazem (CARDIZEM CD) 24 hr capsule 180 mg  180 mg Oral Daily Eustace Pen, PA-C       dofetilide (TIKOSYN) capsule 500 mcg  500 mcg Oral BID Regan Lemming, MD       DULoxetine (CYMBALTA) DR capsule 30 mg  30 mg Oral BID Eustace Pen, PA-C       ezetimibe (ZETIA) tablet 10 mg  10 mg Oral Daily Eustace Pen, PA-C       [START ON 04/13/2023] furosemide (LASIX) tablet 20 mg  20 mg Oral Daily Eustace Pen, PA-C       glipiZIDE (GLUCOTROL) tablet 10 mg  10 mg Oral BID AC Eustace Pen, PA-C   10 mg at 04/12/23 1739   linagliptin (TRADJENTA) tablet 5 mg  5 mg Oral Daily Eustace Pen, PA-C       [START ON 04/13/2023] lisinopril (ZESTRIL) tablet 10 mg  10 mg Oral Daily Eustace Pen, PA-C       [START ON 04/13/2023] loratadine (CLARITIN) tablet 10 mg  10 mg Oral Daily Eustace Pen, [place]       [START ON 04/13/2023] metoprolol succinate (TOPROL-XL) 24 hr tablet 50 mg  50 mg Oral Daily Eustace Pen, PA-C       pantoprazole (PROTONIX) EC tablet 40 mg  40 mg Oral Daily Eustace Pen, PA-C  potassium chloride (KLOR-CON) CR tablet 10 mEq  10 mEq Oral Daily Eustace Pen, PA-C       rosuvastatin (CRESTOR) tablet 10 mg  10 mg Oral Daily Eustace Pen, PA-C       sodium chloride flush (NS) 0.9 % injection 3 mL  3 mL Intravenous Q12H Camnitz, Will Daphine Deutscher, MD   3 mL at 04/12/23 1354   sodium chloride flush (NS) 0.9 % injection 3 mL  3 mL Intravenous PRN Regan Lemming, MD       tamsulosin [facility]) capsule 0.4 mg  0.4 mg Oral Daily Eustace Pen, PA-C        Atrial Fibrillation Management history:  Previous antiarrhythmic drugs: none Previous cardioversions: 12/02/22 Previous ablations: none Anticoagulation history: Eliquis   ROS- All systems are reviewed and negative except as per the HPI above.  Physical Exam: BP (!) 138/96 (BP Location: Right Arm)   Pulse 77   Temp 97.6 F (36.4 C) (Oral)   Resp 18   Ht 6'  (1.829 m)   Wt (!) 147.9 kg   SpO2 100%   BMI 44.23 kg/m   GEN: Well nourished, well developed in no acute distress NECK: No JVD; No carotid bruits CARDIAC: Irregularly irregular rate and rhythm, no murmurs, rubs, gallops RESPIRATORY:  Clear to auscultation without rales, wheezing or rhonchi  ABDOMEN: Soft, non-tender, non-distended EXTREMITIES:  No edema; No deformity   EKG today demonstrates  Vent. rate 83 BPM PR interval * ms QRS duration 92 ms QT/QTcB 364/427 ms P-R-T axes * 27 59 Atrial fibrillation Nonspecific T wave abnormality Abnormal ECG When compared with ECG of 07-Mar-2023 13:02, PREVIOUS ECG IS PRESENT  Echo 11/03/22 demonstrated  1. Left ventricular ejection fraction, by estimation, is 55 to 60%. The  left ventricle has normal function. The left ventricle has no regional  wall motion abnormalities. Left ventricular diastolic parameters are  consistent with Grade I diastolic  dysfunction (impaired relaxation).   2. Right ventricular systolic function is normal. The right ventricular  size is normal. Tricuspid regurgitation signal is inadequate for assessing  PA pressure.   3. Left atrial size was mildly dilated.   4. The mitral valve is normal in structure. No evidence of mitral valve  regurgitation. No evidence of mitral stenosis.   5. The aortic valve has an indeterminant number of cusps. Aortic valve  regurgitation is mild. No aortic stenosis is present.   6. There is borderline dilatation of the ascending aorta, measuring 39  mm.   7. The inferior vena cava is normal in size with greater than 50%  respiratory variability, suggesting right atrial pressure of 3 mmHg.   ASSESSMENT & PLAN CHA2DS2-VASc Score = 3  The patient's score is based upon: CHF History: 0 HTN History: 1 Diabetes History: 1 Stroke History: 0 Vascular Disease History: 1 Age Score: 0 Gender Score: 0       ASSESSMENT AND PLAN: Persistent Atrial Fibrillation (ICD10:  I48.19) The  patient's CHA2DS2-VASc score is 3, indicating a 3.2% annual risk of stroke.    He is currently in Afib.    Secondary Hypercoagulable State (ICD10:  D68.69) The patient is at significant risk for stroke/thromboembolism based upon his CHA2DS2-VASc Score of 3.  Continue Apixaban (Eliquis).  No missed doses.   Patient presents for dofetilide admission. Continue Eliquis, states no missed doses in the last 3 weeks. No recent benadryl use. PharmD has screened medications. QTc in SR 445 ms. Labs today show creatinine 1.27, K  3.9, mag 2.2, CrCl estimate 132 mL/min.   Patient will present to admissions and currently appears to have a bed pending ([id number]).   Lake Bells, PA-C  Afib Clinic [facility] [address] [place], [place] [address] [phone]   ADDEND: See above AFib clinic note to serve as H&P Pt comes for Tikosyn Reports no missed dosed of his OAC/Eliquis Had no follow up questions regarding the admission Dr. Jimmey Ralph has been bedside, reviewed WKG QTc is acceptable to start Labs looks stable Electrolytes with pharmacy team Cacl crCl with actual weight is 111 >> dose   Elery Cadenhead Norberto Sorenson, PA-C  [facility] [address] [place], [place] [address] [phone]

## 2023-04-12 NOTE — Plan of Care (Signed)
  Problem: Education: Goal: Knowledge of General Education information will improve Description: Including pain rating scale, medication(s)/side effects and non-pharmacologic comfort measures Outcome: Progressing   Problem: Health Behavior/Discharge Planning: Goal: Ability to manage health-related needs will improve Outcome: Progressing   Problem: Clinical Measurements: Goal: Ability to maintain clinical measurements within normal limits will improve Outcome: Progressing Goal: Will remain free from infection Outcome: Progressing Goal: Diagnostic test results will improve Outcome: Progressing Goal: Respiratory complications will improve Outcome: Progressing Goal: Cardiovascular complication will be avoided Outcome: Progressing   Problem: Activity: Goal: Risk for activity intolerance will decrease Outcome: Progressing   Problem: Nutrition: Goal: Adequate nutrition will be maintained Outcome: Progressing   Problem: Coping: Goal: Level of anxiety will decrease Outcome: Progressing   Problem: Elimination: Goal: Will not experience complications related to bowel motility Outcome: Progressing Goal: Will not experience complications related to urinary retention Outcome: Progressing   Problem: Pain Managment: Goal: General experience of comfort will improve and/or be controlled Outcome: Progressing   Problem: Safety: Goal: Ability to remain free from injury will improve Outcome: Progressing   Problem: Skin Integrity: Goal: Risk for impaired skin integrity will decrease Outcome: Progressing   Problem: Education: Goal: Knowledge of disease or condition will improve Outcome: Progressing Goal: Understanding of medication regimen will improve Outcome: Progressing Goal: Individualized Educational Video(s) Outcome: Progressing   Problem: Activity: Goal: Ability to tolerate increased activity will improve Outcome: Progressing   Problem: Cardiac: Goal: Ability to achieve  and maintain adequate cardiopulmonary perfusion will improve Outcome: Progressing   Problem: Health Behavior/Discharge Planning: Goal: Ability to safely manage health-related needs after discharge will improve Outcome: Progressing   Problem: Education: Goal: Knowledge of disease or condition will improve Outcome: Progressing Goal: Understanding of medication regimen will improve Outcome: Progressing Goal: Individualized Educational Video(s) Outcome: Progressing   Problem: Activity: Goal: Ability to tolerate increased activity will improve Outcome: Progressing   Problem: Cardiac: Goal: Ability to achieve and maintain adequate cardiopulmonary perfusion will improve Outcome: Progressing   Problem: Health Behavior/Discharge Planning: Goal: Ability to safely manage health-related needs after discharge will improve Outcome: Progressing

## 2023-04-13 DIAGNOSIS — I4819 Other persistent atrial fibrillation: Secondary | ICD-10-CM | POA: Diagnosis not present

## 2023-04-13 LAB — BASIC METABOLIC PANEL
Anion gap: 13 (ref 5–15)
BUN: 12 mg/dL (ref 6–20)
CO2: 23 mmol/L (ref 22–32)
Calcium: 9.4 mg/dL (ref 8.9–10.3)
Chloride: 99 mmol/L (ref 98–111)
Creatinine, Ser: 1.18 mg/dL (ref 0.61–1.24)
GFR, Estimated: >60 mL/min (ref >60)
Glucose, Bld: 142 mg/dL — ABNORMAL HIGH (ref 70–99)
Potassium: 3.8 mmol/L (ref 3.5–5.1)
Sodium: 135 mmol/L (ref 135–145)

## 2023-04-13 LAB — MAGNESIUM: Magnesium: 2.2 mg/dL (ref 1.7–2.4)

## 2023-04-13 MED ORDER — SODIUM CHLORIDE 0.9% FLUSH
3.0000 mL | Freq: Two times a day (BID) | INTRAVENOUS | Status: DC
  Administered 2023-04-13: 3 mL via INTRAVENOUS

## 2023-04-13 MED ORDER — SODIUM CHLORIDE 0.9% FLUSH
3.0000 mL | INTRAVENOUS | Status: DC | PRN
Start: 2023-04-13 — End: 2023-04-14

## 2023-04-13 MED ORDER — POTASSIUM CHLORIDE CRYS ER 20 MEQ PO TBCR
40.0000 meq | EXTENDED_RELEASE_TABLET | Freq: Once | ORAL | Status: AC
  Administered 2023-04-13: 40 meq via ORAL
  Filled 2023-04-13: qty 2

## 2023-04-13 MED ORDER — SENNA 8.6 MG PO TABS
2.0000 | ORAL_TABLET | Freq: Two times a day (BID) | ORAL | Status: DC | PRN
  Filled 2023-04-13: qty 2

## 2023-04-13 NOTE — Progress Notes (Signed)
Post Tikosyn EKG shows A Flutter HR 82 [id number]

## 2023-04-13 NOTE — Progress Notes (Signed)
Pharmacy: Dofetilide (Tikosyn) - Follow Up Assessment and Electrolyte Replacement  Pharmacy consulted to assist in monitoring and replacing electrolytes in this 58 y.o. male admitted on 04/12/2023 undergoing dofetilide initiation.  Labs:    Component Value Date/Time   K 3.8 04/13/2023 0437   MG 2.2 04/13/2023 0437     Plan: Potassium: K 3.8-3.9:  Give KCl 40 mEq po x1   Magnesium: Mg > 2: No additional supplementation needed    Thank you for allowing pharmacy to participate in this patient's care   Harland German, PharmD Clinical Pharmacist **Pharmacist phone directory can now be found on [email or web address] ([email or web address]).  Listed under [facility].

## 2023-04-13 NOTE — Plan of Care (Signed)
  Problem: Education: Goal: Knowledge of General Education information will improve Description: Including pain rating scale, medication(s)/side effects and non-pharmacologic comfort measures Outcome: Progressing   Problem: Health Behavior/Discharge Planning: Goal: Ability to manage health-related needs will improve Outcome: Progressing   Problem: Clinical Measurements: Goal: Ability to maintain clinical measurements within normal limits will improve Outcome: Progressing Goal: Will remain free from infection Outcome: Progressing Goal: Diagnostic test results will improve Outcome: Progressing Goal: Respiratory complications will improve Outcome: Progressing Goal: Cardiovascular complication will be avoided Outcome: Progressing   Problem: Activity: Goal: Risk for activity intolerance will decrease Outcome: Progressing   Problem: Nutrition: Goal: Adequate nutrition will be maintained Outcome: Progressing   Problem: Coping: Goal: Level of anxiety will decrease Outcome: Progressing   Problem: Elimination: Goal: Will not experience complications related to bowel motility Outcome: Progressing Goal: Will not experience complications related to urinary retention Outcome: Progressing   Problem: Pain Managment: Goal: General experience of comfort will improve and/or be controlled Outcome: Progressing   Problem: Safety: Goal: Ability to remain free from injury will improve Outcome: Progressing   Problem: Skin Integrity: Goal: Risk for impaired skin integrity will decrease Outcome: Progressing   Problem: Education: Goal: Knowledge of disease or condition will improve Outcome: Progressing Goal: Understanding of medication regimen will improve Outcome: Progressing Goal: Individualized Educational Video(s) Outcome: Progressing   Problem: Activity: Goal: Ability to tolerate increased activity will improve Outcome: Progressing   Problem: Cardiac: Goal: Ability to achieve  and maintain adequate cardiopulmonary perfusion will improve Outcome: Progressing   Problem: Health Behavior/Discharge Planning: Goal: Ability to safely manage health-related needs after discharge will improve Outcome: Progressing   Problem: Education: Goal: Knowledge of disease or condition will improve Outcome: Progressing Goal: Understanding of medication regimen will improve Outcome: Progressing Goal: Individualized Educational Video(s) Outcome: Progressing   Problem: Activity: Goal: Ability to tolerate increased activity will improve Outcome: Progressing   Problem: Cardiac: Goal: Ability to achieve and maintain adequate cardiopulmonary perfusion will improve Outcome: Progressing   Problem: Health Behavior/Discharge Planning: Goal: Ability to safely manage health-related needs after discharge will improve Outcome: Progressing

## 2023-04-13 NOTE — H&P (View-Only) (Signed)
Post dose EKG reviewed  Coarse AFib 85bpm, QT difficult , machine reads , where I most reliably see T wave get QT , QTc Continue Tikosyn dose tonight  DCCV tomorrow if not in SR  Francis Dowse, PA-C

## 2023-04-13 NOTE — Progress Notes (Signed)
Post dose EKG reviewed  Coarse AFib 85bpm, QT difficult , machine reads , where I most reliably see T wave get QT , QTc Continue Tikosyn dose tonight  DCCV tomorrow if not in SR  Francis Dowse, PA-C

## 2023-04-13 NOTE — Anesthesia Preprocedure Evaluation (Addendum)
Anesthesia Evaluation  Patient identified by MRN, date of birth, ID band Patient awake    Reviewed: Allergy & Precautions, NPO status , Patient's Chart, lab work & pertinent test results  History of Anesthesia Complications Negative for: history of anesthetic complications  Airway Mallampati: III  TM Distance: >3 FB Neck ROM: Full    Dental  (+) Edentulous Upper, Edentulous Lower, Dental Advisory Given   Pulmonary sleep apnea , neg COPD, Patient abstained from smoking.Not current smoker Patient chews tobacco, last chewed this morning. Denies swallowing his chewing tobacco.   Pulmonary exam normal breath sounds clear to auscultation       Cardiovascular Exercise Tolerance: Good METShypertension, Pt. on medications (-) CAD and (-) Past MI + dysrhythmias Atrial Fibrillation + Valvular Problems/Murmurs AI  Rhythm:Irregular Rate:Normal  Echo 10/2022  1. Left ventricular ejection fraction, by estimation, is 55 to 60%. The left ventricle has normal function. The left ventricle has no regional wall motion abnormalities. Left ventricular diastolic parameters are consistent with Grade I diastolic dysfunction (impaired relaxation).   2. Right ventricular systolic function is normal. The right ventricular size is normal. Tricuspid regurgitation signal is inadequate for assessing PA pressure.   3. Left atrial size was mildly dilated.   4. The mitral valve is normal in structure. No evidence of mitral valve regurgitation. No evidence of mitral stenosis.   5. The aortic valve has an indeterminant number of cusps. Aortic valve regurgitation is mild. No aortic stenosis is present.   6. There is borderline dilatation of the ascending aorta, measuring 39 mm.   7. The inferior vena cava is normal in size with greater than 50% respiratory variability, suggesting right atrial pressure of 3 mmHg.    CT coronary Ca2+ 2020 1. Coronary calcium score of 103.  This was 17 percentile for age andsex matched control. 2. Mildly dilated pulmonary artery measuring 32 mm suggestive of pulmonary hypertension.    Neuro/Psych  PSYCHIATRIC DISORDERS  Depression    negative neurological ROS     GI/Hepatic ,GERD  Medicated,,(+)     (-) substance abuse    Endo/Other  diabetes  Class 3 obesityPatient not on GLP1 agonist yet  Renal/GU negative Renal ROS     Musculoskeletal  (+) Arthritis ,    Abdominal  (+) + obese  Peds  Hematology   Anesthesia Other Findings   Reproductive/Obstetrics                             Anesthesia Physical Anesthesia Plan  ASA: 3  Anesthesia Plan: General   Post-op Pain Management: Minimal or no pain anticipated   Induction: Intravenous  PONV Risk Score and Plan: 2 and Propofol infusion, TIVA and Treatment may vary due to age or medical condition  Airway Management Planned: Nasal Cannula  Additional Equipment: None  Intra-op Plan:   Post-operative Plan:   Informed Consent: I have reviewed the patients History and Physical, chart, labs and discussed the procedure including the risks, benefits and alternatives for the proposed anesthesia with the patient or authorized representative who has indicated his/her understanding and acceptance.     Dental advisory given  Plan Discussed with: CRNA  Anesthesia Plan Comments:        Anesthesia Quick Evaluation

## 2023-04-13 NOTE — Progress Notes (Signed)
Rounding Note    Patient Name: Anthony Castillo Date of Encounter: 04/13/2023  [facility] Cardiologist: Julien Nordmann, MD   Subjective   Doing ok, has trouble sleeping  Inpatient Medications    Scheduled Meds:  acetaminophen  1,000 mg Oral QHS   apixaban  5 mg Oral BID   buPROPion  150 mg Oral BID   celecoxib  100 mg Oral q morning   diltiazem  180 mg Oral Daily   dofetilide  500 mcg Oral BID   DULoxetine  30 mg Oral BID   ezetimibe  10 mg Oral Daily   furosemide  20 mg Oral Daily   glipiZIDE  10 mg Oral BID AC   linagliptin  5 mg Oral Daily   lisinopril  10 mg Oral Daily   loratadine  10 mg Oral Daily   metoprolol succinate  50 mg Oral Daily   pantoprazole  40 mg Oral Daily   potassium chloride  10 mEq Oral Daily   rosuvastatin  10 mg Oral Daily   sodium chloride flush  3 mL Intravenous Q12H   tamsulosin  0.4 mg Oral Daily   Continuous Infusions:  sodium chloride     PRN Meds: sodium chloride, albuterol, sodium chloride flush   Vital Signs    Vitals:   04/12/23 1715 04/12/23 1947 04/13/23 0356 04/13/23 0753  BP: (!) 138/96 (!) 155/75 (!) 153/95 (!) 142/91  Pulse: 77 75  93  Resp: 18 16 18 18   Temp: 97.6 F (36.4 C) 97.9 F (36.6 C) 97.9 F (36.6 C) 98 F (36.7 C)  TempSrc: Oral Oral Oral Oral  SpO2: 100% 100%  100%  Weight:      Height:        Intake/Output Summary (Last 24 hours) at 04/13/2023 0920 Last data filed at 04/12/2023 1500 Gross per 24 hour  Intake 360 ml  Output --  Net 360 ml      04/12/2023   12:48 PM 04/12/2023   10:26 AM 04/05/2023    9:11 AM  Last 3 Weights  Weight (lbs) 326 lb 1.6 oz 328 lb 9.6 oz 330 lb 11.2 oz  Weight (kg) 147.918 kg 149.052 kg 150.005 kg      Telemetry    AFib 70's-90;s - Personally Reviewed  ECG    AFib 82bpm, QTc - Personally Reviewed w/Dr. Jimmey Ralph  Physical Exam   Seen by dr. Jimmey Ralph this morning GEN: No acute distress.   Neck: No JVD Cardiac: irreg-irreg, no murmurs, rubs,  or gallops.  Respiratory: CTA b/l. GI: Soft, nontender, non-distended  MS: No edema; No deformity. Neuro:  Nonfocal  Psych: Normal affect   Labs    High Sensitivity Troponin:  No results for input(s): "TROPONINIHS" in the last 720 hours.   Chemistry Recent Labs  Lab 04/12/23 1051 04/13/23 0437  NA 133* 135  K 3.9 3.8  CL 103 99  CO2 25 23  GLUCOSE 119* 142*  BUN 11 12  CREATININE 1.27* 1.18  CALCIUM 8.5* 9.4  MG 2.2 2.2  GFRNONAA >60 >60  ANIONGAP 5 13    Lipids No results for input(s): "CHOL", "TRIG", "HDL", "LABVLDL", "LDLCALC", "CHOLHDL" in the last 168 hours.  HematologyNo results for input(s): "WBC", "RBC", "HGB", "HCT", "MCV", "MCH", "MCHC", "RDW", "PLT" in the last 168 hours. Thyroid No results for input(s): "TSH", "FREET4" in the last 168 hours.  BNPNo results for input(s): "BNP", "PROBNP" in the last 168 hours.  DDimer No results for input(s): "  DDIMER" in the last 168 hours.   Radiology    No results found.  Cardiac Studies   Echo 11/03/22 1. Left ventricular ejection fraction, by estimation, is 55 to 60%. The  left ventricle has normal function. The left ventricle has no regional  wall motion abnormalities. Left ventricular diastolic parameters are  consistent with Grade I diastolic  dysfunction (impaired relaxation).   2. Right ventricular systolic function is normal. The right ventricular  size is normal. Tricuspid regurgitation signal is inadequate for assessing  PA pressure.   3. Left atrial size was mildly dilated.   4. The mitral valve is normal in structure. No evidence of mitral valve  regurgitation. No evidence of mitral stenosis.   5. The aortic valve has an indeterminant number of cusps. Aortic valve  regurgitation is mild. No aortic stenosis is present.   6. There is borderline dilatation of the ascending aorta, measuring 39  mm.   7. The inferior vena cava is normal in size with greater than 50%  respiratory variability, suggesting right  atrial pressure of 3 mmHg.   Patient Profile     58 y.o. male w/PMHx of HTN, DM, HLD, OSA w/CPAP, coronary Ca++, AFib admitted for tikosyn load  Assessment & Plan    Persistent AFib CHA2DS2Vasc is 2, (3 with some coronary Ca+) on Eliquis Tikosyn load is in progress K+ 3.8 Mag 2.2 Creat 1.18 stable QTc stable  Dr. Jimmey Ralph has been bedside, DCCV tomorrow if not in SR, pt aware and agreeable Informed Consent   Shared Decision Making/Informed Consent The risks (stroke, cardiac arrhythmias rarely resulting in the need for a temporary or permanent pacemaker, skin irritation or burns and complications associated with conscious sedation including aspiration, arrhythmia, respiratory failure and death), benefits (restoration of normal sinus rhythm) and alternatives of a direct current cardioversion were explained in detail to Mr. Hebard and he agrees to proceed.   HTN Home meds  DM Home meds  4. Difficult sleep Typically will use benadryl at HS >> cannot given Tikosyn now Will defer medical management of this to his PMD out patient   5. OSA Has his home CPAP   For questions or updates, please contact [facility] Please consult [email or web address] for contact info under        Signed, Sheilah Pigeon, PA-C  04/13/2023, 9:20 AM

## 2023-04-13 NOTE — TOC CM/SW Note (Signed)
Transition of Care [facility]) - Inpatient Brief Assessment   Patient Details  Name: Anthony Castillo MRN: [id number] Date of Birth: 02/08/1966  Transition of Care [facility]) CM/SW Contact:    Gala Lewandowsky, RN Phone Number: 04/13/2023, 3:41 PM   Clinical Narrative: Patient presented for Tikosyn Load. Case Manager spoke with the patient regarding co pay cost. Patient is agreeable to cost and would like to have the initial Rx filled via [facility] and the Rx refills 90 day supply escribed to [facility]. No further needs identified at this time.   Transition of Care Asessment: Insurance and Status: Insurance coverage has been reviewed Patient has primary care physician: Yes Home environment has been reviewed: reviewed Prior level of function:: independent Prior/Current Home Services: No current home services Social Drivers of Health Review: SDOH reviewed no interventions necessary Readmission risk has been reviewed: Yes Transition of care needs: no transition of care needs at this time

## 2023-04-14 ENCOUNTER — Encounter (HOSPITAL_COMMUNITY): Payer: Self-pay | Admitting: Cardiology

## 2023-04-14 ENCOUNTER — Encounter (HOSPITAL_COMMUNITY)
Admission: AD | Disposition: A | Discharge status: Home / Self Care | Payer: Self-pay | Source: Ambulatory Visit | Attending: *Deleted

## 2023-04-14 ENCOUNTER — Inpatient Hospital Stay (HOSPITAL_COMMUNITY): Payer: Self-pay | Admitting: Anesthesiology

## 2023-04-14 DIAGNOSIS — G4733 Obstructive sleep apnea (adult) (pediatric): Secondary | ICD-10-CM

## 2023-04-14 DIAGNOSIS — E119 Type 2 diabetes mellitus without complications: Secondary | ICD-10-CM | POA: Diagnosis not present

## 2023-04-14 DIAGNOSIS — I10 Essential (primary) hypertension: Secondary | ICD-10-CM

## 2023-04-14 DIAGNOSIS — I4819 Other persistent atrial fibrillation: Secondary | ICD-10-CM | POA: Diagnosis not present

## 2023-04-14 DIAGNOSIS — I4891 Unspecified atrial fibrillation: Secondary | ICD-10-CM | POA: Diagnosis not present

## 2023-04-14 HISTORY — PX: CARDIOVERSION: EP1203

## 2023-04-14 LAB — BASIC METABOLIC PANEL
Anion gap: 12 (ref 5–15)
BUN: 12 mg/dL (ref 6–20)
CO2: 24 mmol/L (ref 22–32)
Calcium: 9.1 mg/dL (ref 8.9–10.3)
Chloride: 101 mmol/L (ref 98–111)
Creatinine, Ser: 1.21 mg/dL (ref 0.61–1.24)
GFR, Estimated: >60 mL/min (ref >60)
Glucose, Bld: 119 mg/dL — ABNORMAL HIGH (ref 70–99)
Potassium: 3.7 mmol/L (ref 3.5–5.1)
Sodium: 137 mmol/L (ref 135–145)

## 2023-04-14 LAB — MAGNESIUM: Magnesium: 2.2 mg/dL (ref 1.7–2.4)

## 2023-04-14 SURGERY — CARDIOVERSION (CATH LAB)
Anesthesia: General

## 2023-04-14 MED ORDER — POTASSIUM CHLORIDE CRYS ER 20 MEQ PO TBCR
60.0000 meq | EXTENDED_RELEASE_TABLET | Freq: Once | ORAL | Status: AC
  Administered 2023-04-14: 60 meq via ORAL
  Filled 2023-04-14: qty 3

## 2023-04-14 MED ORDER — LIDOCAINE 2% (20 MG/ML) 5 ML SYRINGE
INTRAMUSCULAR | Status: DC | PRN
  Administered 2023-04-14: 100 mg via INTRAVENOUS

## 2023-04-14 MED ORDER — PROPOFOL 10 MG/ML IV BOLUS
INTRAVENOUS | Status: DC | PRN
Start: 2023-04-14 — End: 2023-04-14
  Administered 2023-04-14: 30 mg via INTRAVENOUS
  Administered 2023-04-14: 70 mg via INTRAVENOUS

## 2023-04-14 MED ORDER — POTASSIUM CHLORIDE ER 10 MEQ PO TBCR
20.0000 meq | EXTENDED_RELEASE_TABLET | Freq: Every day | ORAL | Status: DC
  Administered 2023-04-14: 20 meq via ORAL
  Filled 2023-04-14 (×3): qty 2

## 2023-04-14 SURGICAL SUPPLY — 1 items: PAD DEFIB RADIO PHYSIO CONN (PAD) ×1 IMPLANT

## 2023-04-14 NOTE — Progress Notes (Signed)
Pharmacy: Dofetilide (Tikosyn) - Follow Up Assessment and Electrolyte Replacement  Pharmacy consulted to assist in monitoring and replacing electrolytes in this 58 y.o. male admitted on 04/12/2023 undergoing dofetilide initiation. First dofetilide dose: 2/11 at 8pm  Labs:    Component Value Date/Time   K 3.7 04/14/2023 0443   MG 2.2 04/14/2023 0443     Plan: Potassium: K 3.5-3.7:  Give KCl 60 mEq po x1  And increase daily KCL to daily   Magnesium: Mg > 2: No additional supplementation needed   Have been replacing KCL daily  recommend discharging patient with prescription for:  Potassium chloride 20 mEq  daily   Leota Sauers Pharm.D. CPP, BCPS Clinical Pharmacist [phone] 04/14/2023 11:25 AM

## 2023-04-14 NOTE — Anesthesia Postprocedure Evaluation (Addendum)
Anesthesia Post Note  Patient: Anthony Castillo  Procedure(s) Performed: CARDIOVERSION     Patient location during evaluation: Cath Lab Anesthesia Type: General Level of consciousness: sedated and patient cooperative Pain management: pain level controlled Vital Signs Assessment: post-procedure vital signs reviewed and stable Respiratory status: spontaneous breathing Cardiovascular status: stable Anesthetic complications: no   No notable events documented.  Last Vitals:  Vitals:   04/14/23 1023 04/14/23 1032  BP:    Pulse: 67 69  Resp:    Temp:    SpO2:      Last Pain:  Vitals:   04/14/23 1032  TempSrc:   PainSc: 0-No pain                 Lewie Loron

## 2023-04-14 NOTE — Progress Notes (Signed)
Rounding Note    Patient Name: Anthony Castillo Date of Encounter: 04/14/2023  [facility] Cardiologist: Julien Nordmann, MD   Subjective   Doing ok, denies symptoms  Inpatient Medications    Scheduled Meds:  acetaminophen  1,000 mg Oral QHS   apixaban  5 mg Oral BID   buPROPion  150 mg Oral BID   celecoxib  100 mg Oral q morning   diltiazem  180 mg Oral Daily   dofetilide  500 mcg Oral BID   DULoxetine  30 mg Oral BID   ezetimibe  10 mg Oral Daily   furosemide  20 mg Oral Daily   glipiZIDE  10 mg Oral BID AC   linagliptin  5 mg Oral Daily   lisinopril  10 mg Oral Daily   loratadine  10 mg Oral Daily   metoprolol succinate  50 mg Oral Daily   pantoprazole  40 mg Oral Daily   potassium chloride  20 mEq Oral Daily   potassium chloride  60 mEq Oral Once   rosuvastatin  10 mg Oral Daily   sodium chloride flush  3 mL Intravenous Q12H   tamsulosin  0.4 mg Oral Daily   Continuous Infusions:   PRN Meds: albuterol, senna, sodium chloride flush   Vital Signs    Vitals:   04/14/23 0900 04/14/23 1021 04/14/23 1023 04/14/23 1032  BP: (!) 161/85 (!) 158/85    Pulse: 70  67 69  Resp: (!) 21     Temp:      TempSrc:      SpO2: 92%     Weight:      Height:        Intake/Output Summary (Last 24 hours) at 04/14/2023 1129 Last data filed at 04/13/2023 2023 Gross per 24 hour  Intake 121 ml  Output --  Net 121 ml      04/12/2023   12:48 PM 04/12/2023   10:26 AM 04/05/2023    9:11 AM  Last 3 Weights  Weight (lbs) 326 lb 1.6 oz 328 lb 9.6 oz 330 lb 11.2 oz  Weight (kg) 147.918 kg 149.052 kg 150.005 kg      Telemetry    AFib 70's-90;s - Personally Reviewed  ECG    AFib 78bpm, QTc - Personally Reviewed w/Dr. Jimmey Ralph  Physical Exam   GEN: No acute distress.   Neck: No JVD Cardiac: irreg-irreg, no murmurs, rubs, or gallops.  Respiratory: CTA b/l. GI: Soft, nontender, non-distended  MS: No edema; No deformity. Neuro:  Nonfocal  Psych: Normal  affect   Labs    High Sensitivity Troponin:  No results for input(s): "TROPONINIHS" in the last 720 hours.   Chemistry Recent Labs  Lab 04/12/23 1051 04/13/23 0437 04/14/23 0443  NA 133* 135 137  K 3.9 3.8 3.7  CL 103 99 101  CO2 25 23 24   GLUCOSE 119* 142* 119*  BUN 11 12 12   CREATININE 1.27* 1.18 1.21  CALCIUM 8.5* 9.4 9.1  MG 2.2 2.2 2.2  GFRNONAA >60 >60 >60  ANIONGAP 5 13 12     Lipids No results for input(s): "CHOL", "TRIG", "HDL", "LABVLDL", "LDLCALC", "CHOLHDL" in the last 168 hours.  HematologyNo results for input(s): "WBC", "RBC", "HGB", "HCT", "MCV", "MCH", "MCHC", "RDW", "PLT" in the last 168 hours. Thyroid No results for input(s): "TSH", "FREET4" in the last 168 hours.  BNPNo results for input(s): "BNP", "PROBNP" in the last 168 hours.  DDimer No results for input(s): "DDIMER" in the last  168 hours.   Radiology    EP STUDY Result Date: 04/14/2023 See surgical note for result.   Cardiac Studies   Echo 11/03/22 1. Left ventricular ejection fraction, by estimation, is 55 to 60%. The  left ventricle has normal function. The left ventricle has no regional  wall motion abnormalities. Left ventricular diastolic parameters are  consistent with Grade I diastolic  dysfunction (impaired relaxation).   2. Right ventricular systolic function is normal. The right ventricular  size is normal. Tricuspid regurgitation signal is inadequate for assessing  PA pressure.   3. Left atrial size was mildly dilated.   4. The mitral valve is normal in structure. No evidence of mitral valve  regurgitation. No evidence of mitral stenosis.   5. The aortic valve has an indeterminant number of cusps. Aortic valve  regurgitation is mild. No aortic stenosis is present.   6. There is borderline dilatation of the ascending aorta, measuring 39  mm.   7. The inferior vena cava is normal in size with greater than 50%  respiratory variability, suggesting right atrial pressure of 3 mmHg.    Patient Profile     58 y.o. male w/PMHx of HTN, DM, HLD, OSA w/CPAP, coronary Ca++, AFib admitted for tikosyn load  Assessment & Plan    Persistent AFib CHA2DS2Vasc is 2, (3 with some coronary Ca+) on Eliquis Tikosyn load is in progress K+ 3.7 Mag 2.2 Creat 1.21 stable QTc stable Electrolytes with pharmacy team  DCCV today  HTN Home meds  DM Home meds  4. OSA Has his home CPAP   For questions or updates, please contact [facility] Please consult [email or web address] for contact info under        Signed, Sheilah Pigeon, PA-C  04/14/2023, 11:29 AM

## 2023-04-14 NOTE — Transfer of Care (Signed)
Immediate Anesthesia Transfer of Care Note  Patient: Anthony Castillo  Procedure(s) Performed: CARDIOVERSION  Patient Location: Cath Lab  Anesthesia Type:General  Level of Consciousness: drowsy and patient cooperative  Airway & Oxygen Therapy: Patient Spontanous Breathing and Patient connected to nasal cannula oxygen  Post-op Assessment: Report given to RN and Post -op Vital signs reviewed and stable  Post vital signs: Reviewed and stable  Last Vitals:  Vitals Value Taken Time  BP    Temp    Pulse 72   Resp 21   SpO2 94     Last Pain:  Vitals:   04/14/23 0725  TempSrc: Temporal  PainSc: 0-No pain         Complications: No notable events documented.

## 2023-04-14 NOTE — Discharge Instructions (Signed)

## 2023-04-14 NOTE — Plan of Care (Signed)
Problem: Coping: Goal: Level of anxiety will decrease Outcome: Adequate for Discharge

## 2023-04-14 NOTE — CV Procedure (Signed)
Electrical Cardioversion Procedure Note Anthony Castillo [id number] [birth year]  Procedure: Electrical Cardioversion Indications:  Atrial Fibrillation  Procedure Details Consent: Risks of procedure as well as the alternatives and risks of each were explained to the (patient/caregiver).  Consent for procedure obtained. Time Out: Verified patient identification, verified procedure, site/side was marked, verified correct patient position, special equipment/implants available, medications/allergies/relevent history reviewed, required imaging and test results available.  Performed  Patient placed on cardiac monitor, pulse oximetry, supplemental oxygen as necessary.  Sedation given:  propofol Pacer pads placed anterior and posterior chest.  Cardioverted 1 time(s).  Cardioverted at 300J.  Evaluation Findings: Post procedure EKG shows: NSR Complications: None Patient did tolerate procedure well.   Chilton Si, MD 04/14/2023, 8:42 AM

## 2023-04-14 NOTE — Progress Notes (Signed)
Post DCCV EKG is SR 72bpm, QTc 2 hour EKG is Sr QTc   OK to continue Tikosyn  Francis Dowse, PA-C

## 2023-04-14 NOTE — Interval H&P Note (Signed)
History and Physical Interval Note:  04/14/2023 8:00 AM  Anthony Castillo  has presented today for surgery, with the diagnosis of afib.  The various methods of treatment have been discussed with the patient and family. After consideration of risks, benefits and other options for treatment, the patient has consented to  Procedure(s): CARDIOVERSION (N/A) as a surgical intervention.  The patient's history has been reviewed, patient examined, no change in status, stable for surgery.  I have reviewed the patient's chart and labs.  Questions were answered to the patient's satisfaction.     Chilton Si, MD

## 2023-04-14 NOTE — Plan of Care (Signed)
  Problem: Education: Goal: Knowledge of disease or condition will improve Outcome: Progressing Goal: Understanding of medication regimen will improve Outcome: Progressing   Problem: Activity: Goal: Ability to tolerate increased activity will improve Outcome: Progressing   Problem: Cardiac: Goal: Ability to achieve and maintain adequate cardiopulmonary perfusion will improve Outcome: Progressing   Problem: Health Behavior/Discharge Planning: Goal: Ability to safely manage health-related needs after discharge will improve Outcome: Progressing   Problem: Education: Goal: Knowledge of disease or condition will improve Outcome: Progressing Goal: Understanding of medication regimen will improve Outcome: Progressing   Problem: Activity: Goal: Ability to tolerate increased activity will improve Outcome: Progressing   Problem: Cardiac: Goal: Ability to achieve and maintain adequate cardiopulmonary perfusion will improve Outcome: Progressing

## 2023-04-15 ENCOUNTER — Other Ambulatory Visit (HOSPITAL_COMMUNITY): Payer: Self-pay

## 2023-04-15 DIAGNOSIS — I4819 Other persistent atrial fibrillation: Secondary | ICD-10-CM | POA: Diagnosis not present

## 2023-04-15 LAB — BASIC METABOLIC PANEL
Anion gap: 10 (ref 5–15)
BUN: 14 mg/dL (ref 6–20)
CO2: 23 mmol/L (ref 22–32)
Calcium: 9.1 mg/dL (ref 8.9–10.3)
Chloride: 101 mmol/L (ref 98–111)
Creatinine, Ser: 1.21 mg/dL (ref 0.61–1.24)
GFR, Estimated: >60 mL/min (ref >60)
Glucose, Bld: 149 mg/dL — ABNORMAL HIGH (ref 70–99)
Potassium: 4.2 mmol/L (ref 3.5–5.1)
Sodium: 134 mmol/L — ABNORMAL LOW (ref 135–145)

## 2023-04-15 LAB — MAGNESIUM: Magnesium: 2.1 mg/dL (ref 1.7–2.4)

## 2023-04-15 MED ORDER — POTASSIUM CHLORIDE CRYS ER 20 MEQ PO TBCR
20.0000 meq | EXTENDED_RELEASE_TABLET | Freq: Every day | ORAL | 6 refills | Status: DC
  Filled 2023-04-15 (×2): qty 30, 30d supply, fill #0

## 2023-04-15 MED ORDER — DOFETILIDE 500 MCG PO CAPS: 500.0000 ug | ORAL_CAPSULE | Freq: Two times a day (BID) | ORAL | 5 refills | Status: DC

## 2023-04-15 MED ORDER — POTASSIUM CHLORIDE ER 20 MEQ PO TBCR: 20.0000 meq | EXTENDED_RELEASE_TABLET | Freq: Every day | ORAL | 5 refills | Status: DC

## 2023-04-15 MED ORDER — DOFETILIDE 500 MCG PO CAPS
500.0000 ug | ORAL_CAPSULE | Freq: Two times a day (BID) | ORAL | 6 refills | Status: DC
  Filled 2023-04-15: qty 60, 30d supply, fill #0

## 2023-04-15 NOTE — Care Management (Signed)
  Transition of Care [facility]) Screening Note   Patient Details  Name: Anthony Castillo Date of Birth: 1965-11-30   Transition of Care [facility]) CM/SW Contact:    Lockie Pares, RN Phone Number: 04/15/2023, 10:43 AM    Transition of Care Department [facility]) has reviewed patient and no TOC needs have been identified at this time. We will continue to monitor patient advancement through interdisciplinary progression rounds. If new patient transition needs arise, please place a TOC consult.

## 2023-04-15 NOTE — Progress Notes (Signed)
Pharmacy: Dofetilide (Tikosyn) - Follow Up Assessment and Electrolyte Replacement  Pharmacy consulted to assist in monitoring and replacing electrolytes in this 58 y.o. male admitted on 04/12/2023 undergoing dofetilide initiation.   Labs:    Component Value Date/Time   K 4.2 04/15/2023 0439   MG 2.1 04/15/2023 0439     Plan: Potassium: K >/= 4: No additional supplementation needed  Magnesium: Mg > 2: No additional supplementation needed   Recommend discharging patient with prescription for:  Potassium chloride 20 mEq  daily  Thank you for allowing pharmacy to participate in this patient's care   Harland German, PharmD Clinical Pharmacist **Pharmacist phone directory can now be found on [email or web address] ([email or web address]).  Listed under [facility].

## 2023-04-15 NOTE — Discharge Summary (Addendum)
ELECTROPHYSIOLOGY PROCEDURE DISCHARGE SUMMARY    Patient ID: Anthony Castillo,  MRN: [id number], DOB/AGE: 60-Oct-1967 60 y.o.  Admit date: 04/12/2023 Discharge date: 04/15/2023  Primary Care Physician: Lorre Munroe, NP  Primary Cardiologist: Dr. Mariah Milling Electrophysiologist: Dr. Jimmey Ralph (new)  Primary Discharge Diagnosis:  1.  persistent atrial fibrillation status post Tikosyn loading this admission      CHA2DS2Vasc is 2, on eliquis  Secondary Discharge Diagnosis:   HTN DM OSA  Allergies  Allergen Reactions   Penicillins Other (See Comments)    REACTION: unspecified     Procedures This Admission:  1.  Tikosyn loading 2.  Direct current cardioversion on 04/14/23 by Dr Duke Salvia which successfully restored SR.  There were no early apparent complications.   Brief HPI: Anthony Castillo is a 58 y.o. male with a past medical history as noted above.  They were referred to EP in the outpatient setting for treatment options of atrial fibrillation.  Risks, benefits, and alternatives to Tikosyn were reviewed with the patient who wished to proceed.    Hospital Course:  The patient was admitted and Tikosyn was initiated.  Renal function and electrolytes were followed during the hospitalization.  The patient's QTc remained stable.  On 04/14/23 the patient underwent direct current cardioversion which restored sinus rhythm.  He was monitored until discharge on telemetry which demonstrated AFib > SR.  On the day of discharge, he feels well, was examined by Dr Elberta Fortis who considered the patient stable for discharge to home.  Follow-up has been arranged with the AFib clinic in 1 week and with EP team in 4 weeks.   Tikosyn teaching was completed electrolyte replacement for home > increase his home K+ to daily   Physical Exam: Vitals:   04/14/23 1554 04/14/23 2016 04/15/23 0335 04/15/23 0748  BP: (!) 148/88 (!) 146/82 (!) 140/83 (!) 161/83  Pulse: 67 79 66   Resp: 17 18 20 20   Temp:  98.2 F (36.8 C) 97.7 F (36.5 C) 98 F (36.7 C) 98.4 F (36.9 C)  TempSrc: Oral Oral Oral Oral  SpO2: 100% 100% 96%   Weight:      Height:         GEN- The patient is well appearing, alert and oriented x 3 today.   HEENT: normocephalic, atraumatic; sclera clear, conjunctiva pink; hearing intact; oropharynx clear; neck supple, no JVP Lymph- no cervical lymphadenopathy Lungs- CTA b/l, normal work of breathing.  No wheezes, rales, rhonchi Heart- RRR, no murmurs, rubs or gallops, PMI not laterally displaced GI- soft, non-tender, non-distended Extremities- no clubbing, cyanosis, or edema MS- no significant deformity or atrophy Skin- warm and dry, no rash or lesion Psych- euthymic mood, full affect Neuro- strength and sensation are intact   Labs:   Lab Results  Component Value Date   WBC 5.5 11/30/2022   HGB 15.0 11/30/2022   HCT 47.7 11/30/2022   MCV 84 11/30/2022   PLT 217 11/30/2022    Recent Labs  Lab 04/15/23 0439  NA 134*  K 4.2  CL 101  CO2 23  BUN 14  CREATININE 1.21  CALCIUM 9.1  GLUCOSE 149*     Discharge Medications:  Allergies as of 04/15/2023       Reactions   Penicillins Other (See Comments)   REACTION: unspecified        Medication List     TAKE these medications    albuterol 108 (90 Base) MCG/ACT inhaler Commonly known as: VENTOLIN  HFA Inhale 2 puffs into the lungs every 6 (six) hours as needed for wheezing or shortness of breath.   apixaban 5 MG Tabs tablet Commonly known as: ELIQUIS Take 1 tablet (5 mg total) by mouth 2 (two) times daily.   buPROPion 150 MG 12 hr tablet Commonly known as: WELLBUTRIN SR Take 1 tablet (150 mg total) by mouth 2 (two) times daily.   celecoxib 100 MG capsule Commonly known as: CELEBREX TAKE ONE CAPSULE BY MOUTH TWICE DAILY What changed: when to take this Notes to patient: Caution regular use given of this medication your Eliquis   cetirizine 10 MG tablet Commonly known as: ZYRTEC Take 10 mg by  mouth daily.   Dexcom G7 Sensor Misc APPLY ONE SENSOR TRANSDERMALLY EVERY TEN DAYS   diltiazem 180 MG 24 hr capsule Commonly known as: CARDIZEM CD Take 1 capsule (180 mg total) by mouth daily.   dofetilide 500 MCG capsule Commonly known as: TIKOSYN Take 1 capsule (500 mcg total) by mouth 2 (two) times daily.   DULoxetine 30 MG capsule Commonly known as: Cymbalta Take 1 capsule (30 mg total) by mouth daily for 30 days, THEN 2 capsules (60 mg total) daily. Start taking on: March 14, 2023 What changed: See the new instructions.   empagliflozin 25 MG Tabs tablet Commonly known as: JARDIANCE Take 1 tablet (25 mg total) by mouth daily before breakfast.   ezetimibe 10 MG tablet Commonly known as: ZETIA TAKE ONE TABLET (10 MG TOTAL) BY MOUTH DAILY.   furosemide 20 MG tablet Commonly known as: LASIX Take 1 tablet (20 mg total) by mouth daily.   glipiZIDE 10 MG tablet Commonly known as: GLUCOTROL TAKE ONE TABLET BY MOUTH TWICE A DAY BEFORE A MEAL.   Januvia 100 MG tablet Generic drug: sitaGLIPtin TAKE ONE TABLET BY MOUTH ONCE A DAY   lisinopril 10 MG tablet Commonly known as: ZESTRIL Take 1 tablet (10 mg total) by mouth daily.   metoprolol succinate 50 MG 24 hr tablet Commonly known as: TOPROL-XL Take 1 tablet (50 mg total) by mouth daily. Take with or immediately following a meal.   pantoprazole 40 MG tablet Commonly known as: PROTONIX TAKE ONE TABLET BY MOUTH ONCE A DAY   Potassium Chloride ER 20 MEQ Tbcr Take 1 tablet (20 mEq total) by mouth daily. What changed:  medication strength how much to take   rosuvastatin 10 MG tablet Commonly known as: CRESTOR TAKE ONE TABLET (10 MG TOTAL) BY MOUTH DAILY.   tamsulosin 0.4 MG Caps capsule Commonly known as: FLOMAX TAKE ONE CAPSULE BY MOUTH ONCE DAILY   Trulicity 0.75 MG/0.5ML Soaj Generic drug: Dulaglutide INJECT 0.75 MG INTO THE SKIN ONCE A WEEK.   TYLENOL ARTHRITIS PAIN PO Take 1,300 mg by mouth at  bedtime.   Vitamin D3 25 MCG (1000 UT) Caps Take 1 capsule by mouth daily.        Disposition: home Discharge Instructions     Diet - low sodium heart healthy   Complete by: As directed    Increase activity slowly   Complete by: As directed         Duration of Discharge Encounter: 25 minutes, APP time.  Norma Fredrickson, PA-C 04/15/2023 11:35 AM  I have seen and examined this patient with Anthony Castillo.  Agree with above, note added to reflect my findings.  Patient currently feeling well.  No acute complaints.  Was admitted to the hospital for dofetilide load.  Required cardioversion after the fourth dose.  Remains in sinus rhythm.  QTc remained stable.  GEN: Well nourished, well developed, in no acute distress  HEENT: normal  Neck: no JVD, carotid bruits, or masses Cardiac: RRR; no murmurs, rubs, or gallops,no edema  Respiratory:  clear to auscultation bilaterally, normal work of breathing GI: soft, nontender, nondistended, + BS MS: no deformity or atrophy  Skin: warm and dry Neuro:  Strength and sensation are intact Psych: euthymic mood, full affect   Persistent atrial fibrillation: Post dofetilide load.  Patient remains in sinus rhythm.  QTc remained stable.  Aaleeyah Bias plan for discharge today.  Patient Tremond Shimabukuro follow-up in atrial fibrillation clinic.  Discharged on 500 mcg twice daily. Hypertension: Currently well-controlled Diabetes: Continue home medications  MD time spent on discharge: 30 minutes  Kala Ambriz M. Rionna Feltes MD 04/15/2023 2:36 PM

## 2023-04-18 ENCOUNTER — Telehealth: Payer: Self-pay

## 2023-04-18 NOTE — Transitions of Care (Post Inpatient/ED Visit) (Signed)
04/18/2023  Name: Anthony Castillo MRN: [id number] DOB: May 04, 1965  Today's TOC FU Call Status: Today's TOC FU Call Status:: Successful TOC FU Call Completed TOC FU Call Complete Date: 04/18/23 Patient's Name and Date of Birth confirmed.  Transition Care Management Follow-up Telephone Call Date of Discharge: 04/15/23 Discharge Facility: [facility]) Type of Discharge: Inpatient Admission Primary Inpatient Discharge Diagnosis:: Tikisyn Loading How have you been since you were released from the hospital?: Better Any questions or concerns?: No  Items Reviewed: Did you receive and understand the discharge instructions provided?: Yes Medications obtained,verified, and reconciled?: Yes (Medications Reviewed) Any new allergies since your discharge?: No Dietary orders reviewed?: Yes Type of Diet Ordered:: Low sodium Heart Healthy Do you have support at home?: Yes People in Home: spouse Name of Support/Comfort Primary Source: Cathy  Medications Reviewed Today: Medications Reviewed Today     Reviewed by Redge Gainer, RN (Case Manager) on 04/18/23 at 1258  Med List Status: <None>   Medication Order Taking? Sig Documenting Provider Last Dose Status Informant  Acetaminophen (TYLENOL ARTHRITIS PAIN PO) [id number] No Take 1,300 mg by mouth at bedtime. [provider] 04/11/2023 Active Self, Spouse/Significant Other, Pharmacy Records  albuterol (VENTOLIN HFA) 108 (90 Base) MCG/ACT inhaler [id number] No Inhale 2 puffs into the lungs every 6 (six) hours as needed for wheezing or shortness of breath. Oretha Milch, MD Past Month Active Self, Spouse/Significant Other, Pharmacy Records  apixaban (ELIQUIS) 5 MG TABS tablet [id number] No Take 1 tablet (5 mg total) by mouth 2 (two) times daily. Antonieta Iba, MD 04/12/2023 Active Self, Spouse/Significant Other, Pharmacy Records  buPROPion [facility] SR) 150 MG 12 hr tablet [id number] No Take 1 tablet (150 mg total) by mouth 2 (two)  times daily. Lorre Munroe, NP 04/12/2023 Active Self, Spouse/Significant Other, Pharmacy Records  celecoxib (CELEBREX) 100 MG capsule [id number] No TAKE ONE CAPSULE BY MOUTH TWICE DAILY  Patient taking differently: Take 100 mg by mouth every morning.   Lorre Munroe, NP 04/12/2023 Active Self, Spouse/Significant Other, Pharmacy Records  cetirizine (ZYRTEC) 10 MG tablet [id number] No Take 10 mg by mouth daily. [provider] 04/12/2023 Active Self, Spouse/Significant Other, Pharmacy Records  Cholecalciferol (VITAMIN D3) 1000 units CAPS [id number] No Take 1 capsule by mouth daily. [provider] 04/12/2023 Active Self, Spouse/Significant Other, Pharmacy Records  Discontinued 05/10/11 1601   Continuous Glucose Sensor (DEXCOM G7 SENSOR) MISC [id number] No APPLY ONE SENSOR TRANSDERMALLY EVERY TEN DAYS Lorre Munroe, NP Taking Active Self, Spouse/Significant Other, Pharmacy Records  diltiazem (CARDIZEM CD) 180 MG 24 hr capsule [id number] No Take 1 capsule (180 mg total) by mouth daily. Antonieta Iba, MD 04/12/2023 Expired 04/12/23 2359 Self, Spouse/Significant Other, Pharmacy Records  dofetilide (TIKOSYN) 500 MCG capsule [id number]  Take 1 capsule (500 mcg total) by mouth 2 (two) times daily. Sheilah Pigeon, PA-C  Active   DULoxetine (CYMBALTA) 30 MG capsule [id number] No Take 1 capsule (30 mg total) by mouth daily for 30 days, THEN 2 capsules (60 mg total) daily.  Patient taking differently: Taking 30 mg by mouth twice daily   Lorre Munroe, NP 04/12/2023 Active Self, Spouse/Significant Other, Pharmacy Records  empagliflozin (JARDIANCE) 25 MG TABS tablet [id number] No Take 1 tablet (25 mg total) by mouth daily before breakfast. Lorre Munroe, NP 04/09/2023 Active Self, Spouse/Significant Other, Pharmacy Records           Med Note Neil Crouch, SEBASTIAN   Tue Apr 12, 2023  4:14 PM) Medication has  been held since 04/09/23 in preparation for tikosyn   ezetimibe (ZETIA) 10 MG tablet [id number]  No TAKE ONE TABLET (10 MG TOTAL) BY MOUTH DAILY. Antonieta Iba, MD 04/11/2023 Active Self, Spouse/Significant Other, Pharmacy Records  furosemide (LASIX) 20 MG tablet [id number] No Take 1 tablet (20 mg total) by mouth daily. Antonieta Iba, MD 04/12/2023 Expired 04/12/23 2359 Self, Spouse/Significant Other, Pharmacy Records  glipiZIDE (GLUCOTROL) 10 MG tablet [id number] No TAKE ONE TABLET BY MOUTH TWICE A DAY BEFORE A MEAL. Lorre Munroe, NP 04/12/2023 Active Self, Spouse/Significant Other, Pharmacy Records  JANUVIA 100 MG tablet [id number] No TAKE ONE TABLET BY MOUTH ONCE A DAY Lorre Munroe, NP 04/11/2023 Active Self, Spouse/Significant Other, Pharmacy Records  lisinopril (ZESTRIL) 10 MG tablet [id number] No Take 1 tablet (10 mg total) by mouth daily. Lorre Munroe, NP 04/12/2023 Active Self, Spouse/Significant Other, Pharmacy Records  metoprolol succinate (TOPROL-XL) 50 MG 24 hr tablet [id number] No Take 1 tablet (50 mg total) by mouth daily. Take with or immediately following a meal. Antonieta Iba, MD 04/12/2023 Expired 04/12/23 2359 Self, Spouse/Significant Other, Pharmacy Records  pantoprazole (PROTONIX) 40 MG tablet [id number] No TAKE ONE TABLET BY MOUTH ONCE A DAY Lorre Munroe, NP 04/11/2023 Active Self, Spouse/Significant Other, Pharmacy Records  potassium chloride SA (KLOR-CON M) 20 MEQ tablet [id number]  Take 1 tablet (20 mEq total) by mouth daily. Sheilah Pigeon, PA-C  Active   rosuvastatin (CRESTOR) 10 MG tablet [id number] No TAKE ONE TABLET (10 MG TOTAL) BY MOUTH DAILY. Antonieta Iba, MD 04/11/2023 Active Self, Spouse/Significant Other, Pharmacy Records  tamsulosin (FLOMAX) 0.4 MG CAPS capsule [id number] No TAKE ONE CAPSULE BY MOUTH ONCE DAILY Lorre Munroe, NP 04/12/2023 Active Self, Spouse/Significant Other, Pharmacy Records  TRULICITY 0.75 MG/0.5ML SOAJ [id number] No INJECT 0.75 MG INTO THE SKIN ONCE A WEEK. Lorre Munroe, NP Past Month Active Self, Spouse/Significant Other,  Pharmacy Records           Med Note Neil Crouch, SEBASTIAN   Tue Apr 12, 2023  4:16 PM) Medication being held in preparation for [facility] and Equipment/Supplies: Were Home Health Services Ordered?: No Any new equipment or medical supplies ordered?: No  Functional Questionnaire: Do you need assistance with bathing/showering or dressing?: No Do you need assistance with meal preparation?: No Do you need assistance with eating?: No Do you have difficulty maintaining continence: No Do you need assistance with getting out of bed/getting out of a chair/moving?: No Do you have difficulty managing or taking your medications?: No  Follow up appointments reviewed: PCP Follow-up appointment confirmed?: NA Specialist Hospital Follow-up appointment confirmed?: Yes Date of Specialist follow-up appointment?: 04/22/23 Follow-Up Specialty Provider:: A-fib clinic Do you need transportation to your follow-up appointment?: No Do you understand care options if your condition(s) worsen?: Yes-patient verbalized understanding  SDOH Interventions Today    Flowsheet Row Most Recent Value  SDOH Interventions   Food Insecurity Interventions Intervention Not Indicated  Housing Interventions Intervention Not Indicated  Transportation Interventions Intervention Not Indicated  Utilities Interventions Intervention Not Indicated      Interventions Today    Flowsheet Row Most Recent Value  Chronic Disease   Chronic disease during today's visit Atrial Fibrillation (AFib)  General Interventions   General Interventions Discussed/Reviewed General Interventions Discussed, Doctor Visits  Doctor Visits Discussed/Reviewed Doctor Visits Discussed  Education Interventions   Education Provided Provided Education  Provided Verbal Education On [employer],  Medication  Pharmacy Interventions   Pharmacy Dicussed/Reviewed Medications and their functions       The patient has been provided  with contact information for the care management team and has been advised to call with any health-related questions or concerns. The patient verbalized understanding with current POC. The patient is directed to their insurance card regarding availability of benefits coverage.   Deidre Ala, BSN, RN [place]  VBCI - [employer] [facility] RN Care Manager [phone]

## 2023-04-18 NOTE — Progress Notes (Addendum)
PA started   [employer] (Key: [id number]) Rx #: [id number] Need Help? Call us at [phone] Status New (Not sent to plan) Drug Pantoprazole Sodium 40MG  dr tablets ePA cloud logo Form [employer] Electronic PA Form [phone] NCPDP) Original Claim Info [phone] Plan Limitations Exceeded   Approved 04/18/23-04/17/24

## 2023-04-22 ENCOUNTER — Inpatient Hospital Stay (HOSPITAL_BASED_OUTPATIENT_CLINIC_OR_DEPARTMENT_OTHER)
Admission: AD | Admit: 2023-04-22 | Discharge: 2023-04-22 | Disposition: A | Discharge status: Home / Self Care | Payer: 59 | Source: Ambulatory Visit | Attending: Internal Medicine | Admitting: Internal Medicine

## 2023-04-22 VITALS — BP 174/80 | HR 58 | Ht 72.0 in | Wt 329.8 lb

## 2023-04-22 DIAGNOSIS — I2510 Atherosclerotic heart disease of native coronary artery without angina pectoris: Secondary | ICD-10-CM | POA: Diagnosis not present

## 2023-04-22 DIAGNOSIS — Z79899 Other long term (current) drug therapy: Secondary | ICD-10-CM

## 2023-04-22 DIAGNOSIS — Z6841 Body Mass Index (BMI) 40.0 and over, adult: Secondary | ICD-10-CM | POA: Diagnosis not present

## 2023-04-22 DIAGNOSIS — E119 Type 2 diabetes mellitus without complications: Secondary | ICD-10-CM | POA: Diagnosis not present

## 2023-04-22 DIAGNOSIS — G4733 Obstructive sleep apnea (adult) (pediatric): Secondary | ICD-10-CM | POA: Diagnosis not present

## 2023-04-22 DIAGNOSIS — E785 Hyperlipidemia, unspecified: Secondary | ICD-10-CM | POA: Diagnosis not present

## 2023-04-22 DIAGNOSIS — D6869 Other thrombophilia: Secondary | ICD-10-CM | POA: Diagnosis not present

## 2023-04-22 DIAGNOSIS — Z7984 Long term (current) use of oral hypoglycemic drugs: Secondary | ICD-10-CM | POA: Diagnosis not present

## 2023-04-22 DIAGNOSIS — I4819 Other persistent atrial fibrillation: Secondary | ICD-10-CM

## 2023-04-22 DIAGNOSIS — Z5181 Encounter for therapeutic drug level monitoring: Secondary | ICD-10-CM | POA: Diagnosis not present

## 2023-04-22 DIAGNOSIS — Z7901 Long term (current) use of anticoagulants: Secondary | ICD-10-CM | POA: Diagnosis not present

## 2023-04-22 DIAGNOSIS — I110 Hypertensive heart disease with heart failure: Secondary | ICD-10-CM | POA: Diagnosis not present

## 2023-04-22 DIAGNOSIS — I5032 Chronic diastolic (congestive) heart failure: Secondary | ICD-10-CM | POA: Diagnosis not present

## 2023-04-22 LAB — BASIC METABOLIC PANEL
Anion gap: 8 (ref 5–15)
BUN: 8 mg/dL (ref 6–20)
CO2: 27 mmol/L (ref 22–32)
Calcium: 9 mg/dL (ref 8.9–10.3)
Chloride: 102 mmol/L (ref 98–111)
Creatinine, Ser: 1.15 mg/dL (ref 0.61–1.24)
GFR, Estimated: >60 mL/min (ref >60)
Glucose, Bld: 107 mg/dL — ABNORMAL HIGH (ref 70–99)
Potassium: 4 mmol/L (ref 3.5–5.1)
Sodium: 137 mmol/L (ref 135–145)

## 2023-04-22 LAB — MAGNESIUM: Magnesium: 2.4 mg/dL (ref 1.7–2.4)

## 2023-04-22 MED ORDER — DOFETILIDE 500 MCG PO CAPS: 500.0000 ug | ORAL_CAPSULE | Freq: Two times a day (BID) | ORAL | 3 refills | Status: DC

## 2023-04-22 MED ORDER — POTASSIUM CHLORIDE CRYS ER 20 MEQ PO TBCR: 20.0000 meq | EXTENDED_RELEASE_TABLET | Freq: Every day | ORAL | 6 refills | Status: DC

## 2023-04-22 NOTE — Progress Notes (Addendum)
Primary Care Physician: Lorre Munroe, NP Primary Cardiologist: Julien Nordmann, MD Electrophysiologist: Nobie Putnam, MD     Referring Physician:      EZEKEIL BETHEL is a 58 y.o. 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 Patient is here for Tikosyn admission for rhythm control. Patient is on Eliquis 5 mg BID for a CHADS2VASC score of 3.  On evaluation today, patient is here for Tikosyn admission. No benadryl use. He held [place] and Trulicity in anticipation of possible cardioversion. No missed doses of Eliquis 5 mg BID.  On follow up 04/22/23, he is here for 1 week Tikosyn surveillance. He is currently in NSR. S/p Tikosyn admission 2/11-14/25. S/p successful DCCV on 04/14/23. He was monitored until discharge on telemetry which demonstrated Afib more than NSR. Potassium increased to 20 meq daily upon discharge. No missed doses of Tikosyn or Eliquis.  Today, he denies symptoms of palpitations, chest pain, shortness of breath, orthopnea, PND, lower extremity edema, dizziness, presyncope, syncope, snoring, daytime somnolence, bleeding, or neurologic sequela. The patient is tolerating medications without difficulties and is otherwise without complaint today.    Atrial Fibrillation Risk Factors:  he does have symptoms or diagnosis of sleep apnea. he is not compliant with CPAP therapy.  he has a BMI of Body mass index is 44.73 kg/m.Marland Kitchen Filed Weights   04/22/23 0836  Weight: (!) 149.6 kg     Current Outpatient Medications  Medication Sig Dispense Refill   Acetaminophen (TYLENOL ARTHRITIS PAIN PO) Take 1,300 mg by mouth at bedtime.     albuterol (VENTOLIN HFA) 108 (90 Base) MCG/ACT inhaler Inhale 2 puffs into the lungs every 6 (six) hours as needed for wheezing or shortness of breath. 8 g 6   apixaban (ELIQUIS) 5 MG TABS tablet Take  1 tablet (5 mg total) by mouth 2 (two) times daily. 180 tablet 3   buPROPion (WELLBUTRIN SR) 150 MG 12 hr tablet Take 1 tablet (150 mg total) by mouth 2 (two) times daily. 180 tablet 1   celecoxib (CELEBREX) 100 MG capsule TAKE ONE CAPSULE BY MOUTH TWICE DAILY (Patient taking differently: Take 100 mg by mouth every morning.) 180 capsule 0   cetirizine (ZYRTEC) 10 MG tablet Take 10 mg by mouth daily.     Cholecalciferol (VITAMIN D3) 1000 units CAPS Take 1 capsule by mouth daily.     Continuous Glucose Sensor (DEXCOM G7 SENSOR) MISC APPLY ONE SENSOR TRANSDERMALLY EVERY TEN DAYS 6 each 1   diltiazem (CARDIZEM CD) 180 MG 24 hr capsule Take 1 capsule (180 mg total) by mouth daily. 90 capsule 3   dofetilide (TIKOSYN) 500 MCG capsule Take 1 capsule (500 mcg total) by mouth 2 (two) times daily. 60 capsule 6   DULoxetine (CYMBALTA) 30 MG capsule Take 1 capsule (30 mg total) by mouth daily for 30 days, THEN 2 capsules (60 mg total) daily. (Patient taking differently: Taking 30 mg by mouth twice daily) 150 capsule 0   empagliflozin (JARDIANCE) 25 MG TABS tablet Take 1 tablet (25 mg total) by mouth daily before breakfast. 90 tablet 1   ezetimibe (ZETIA) 10 MG tablet TAKE ONE TABLET (10 MG TOTAL) BY MOUTH DAILY. 90 tablet 3   furosemide (LASIX) 20 MG tablet Take 1 tablet (20 mg total) by mouth daily. 90 tablet 3   glipiZIDE (GLUCOTROL) 10 MG tablet TAKE ONE TABLET BY MOUTH  TWICE A DAY BEFORE A MEAL. 180 tablet 1   JANUVIA 100 MG tablet TAKE ONE TABLET BY MOUTH ONCE A DAY 90 tablet 0   lisinopril (ZESTRIL) 10 MG tablet Take 1 tablet (10 mg total) by mouth daily. 90 tablet 1   metoprolol succinate (TOPROL-XL) 50 MG 24 hr tablet Take 1 tablet (50 mg total) by mouth daily. Take with or immediately following a meal. 90 tablet 3   pantoprazole (PROTONIX) 40 MG tablet TAKE ONE TABLET BY MOUTH ONCE A DAY 90 tablet 2   potassium chloride SA (KLOR-CON M) 20 MEQ tablet Take 1 tablet (20 mEq total) by mouth daily. 30 tablet  6   rosuvastatin (CRESTOR) 10 MG tablet TAKE ONE TABLET (10 MG TOTAL) BY MOUTH DAILY. 90 tablet 0   tamsulosin (FLOMAX) 0.4 MG CAPS capsule TAKE ONE CAPSULE BY MOUTH ONCE DAILY 90 capsule 1   TRULICITY 0.75 MG/0.5ML SOAJ INJECT 0.75 MG INTO THE SKIN ONCE A WEEK. 6 mL 0   No current facility-administered medications for this encounter.    Atrial Fibrillation Management history:  Previous antiarrhythmic drugs: Tikosyn Previous cardioversions: 12/02/22, 04/14/23 Previous ablations: none Anticoagulation history: Eliquis   ROS- All systems are reviewed and negative except as per the HPI above.  Physical Exam: BP (!) 174/80   Pulse (!) 58   Ht 6' (1.829 m)   Wt (!) 149.6 kg   BMI 44.73 kg/m   GEN- The patient is well appearing, alert and oriented x 3 today.   Neck - no JVD or carotid bruit noted Lungs- Clear to ausculation bilaterally, normal work of breathing Heart- Regular rate and rhythm, no murmurs, rubs or gallops, PMI not laterally displaced Extremities- no clubbing, cyanosis, or edema Skin - no rash or ecchymosis noted   EKG today demonstrates  Vent. rate 58 BPM PR interval 196 ms QRS duration 92 ms QT/QTcB 484/475 ms P-R-T axes 14 46 62 Sinus bradycardia Nonspecific T wave abnormality Prolonged QT Abnormal ECG When compared with ECG of 14-Apr-2023 22:48, PREVIOUS ECG IS PRESENT  Echo 11/03/22 demonstrated  1. Left ventricular ejection fraction, by estimation, is 55 to 60%. The  left ventricle has normal function. The left ventricle has no regional  wall motion abnormalities. Left ventricular diastolic parameters are  consistent with Grade I diastolic  dysfunction (impaired relaxation).   2. Right ventricular systolic function is normal. The right ventricular  size is normal. Tricuspid regurgitation signal is inadequate for assessing  PA pressure.   3. Left atrial size was mildly dilated.   4. The mitral valve is normal in structure. No evidence of mitral valve   regurgitation. No evidence of mitral stenosis.   5. The aortic valve has an indeterminant number of cusps. Aortic valve  regurgitation is mild. No aortic stenosis is present.   6. There is borderline dilatation of the ascending aorta, measuring 39  mm.   7. The inferior vena cava is normal in size with greater than 50%  respiratory variability, suggesting right atrial pressure of 3 mmHg.   ASSESSMENT & PLAN CHA2DS2-VASc Score = 3  The patient's score is based upon: CHF History: 0 HTN History: 1 Diabetes History: 1 Stroke History: 0 Vascular Disease History: 1 Age Score: 0 Gender Score: 0       ASSESSMENT AND PLAN: Persistent Atrial Fibrillation (ICD10:  I48.19) The patient's CHA2DS2-VASc score is 3, indicating a 3.2% annual risk of stroke.   S/p Tikosyn admission 2/11-14/2025  He is currently in NSR.  High  risk medication monitoring (ICD10: R7229428) Patient requires ongoing monitoring for anti-arrhythmic medication which has the potential to cause life threatening arrhythmias or AV block. Qtc stable. Continue Tikosyn 500 mcg BID. Bmet and mag drawn today.   Secondary Hypercoagulable State (ICD10:  D68.69) The patient is at significant risk for stroke/thromboembolism based upon his CHA2DS2-VASc Score of 3.  Continue Apixaban (Eliquis).  No missed doses.   Hypertension Repeat BP still systolic 160s. Patient notes his BP is typically elevated at doctor's office. Advised to please monitor at home so can discuss BP readings at next office visit.     Follow up in 1 month for Tikosyn surveillance.    Lake Bells, PA-C  Afib Clinic [facility] [address] [place], [place] [address] [phone]

## 2023-05-10 DIAGNOSIS — G4733 Obstructive sleep apnea (adult) (pediatric): Secondary | ICD-10-CM | POA: Diagnosis not present

## 2023-05-11 DIAGNOSIS — G4733 Obstructive sleep apnea (adult) (pediatric): Secondary | ICD-10-CM | POA: Diagnosis not present

## 2023-05-16 ENCOUNTER — Other Ambulatory Visit: Payer: Self-pay | Admitting: Internal Medicine

## 2023-05-17 ENCOUNTER — Ambulatory Visit: Payer: 59 | Attending: Cardiology | Admitting: Cardiology

## 2023-05-17 VITALS — BP 177/91 | HR 73 | Ht 72.0 in | Wt 330.4 lb

## 2023-05-17 DIAGNOSIS — Z5181 Encounter for therapeutic drug level monitoring: Secondary | ICD-10-CM | POA: Diagnosis not present

## 2023-05-17 DIAGNOSIS — Z79899 Other long term (current) drug therapy: Secondary | ICD-10-CM | POA: Diagnosis not present

## 2023-05-17 DIAGNOSIS — I4819 Other persistent atrial fibrillation: Secondary | ICD-10-CM

## 2023-05-17 DIAGNOSIS — D6869 Other thrombophilia: Secondary | ICD-10-CM

## 2023-05-17 NOTE — Telephone Encounter (Signed)
 Requested Prescriptions  Pending Prescriptions Disp Refills   TRULICITY 0.75 MG/0.5ML SOAJ [Pharmacy Med Name: TRULICITY 0.75/0.5 SOLN AUTO-INJ] 6 mL 0    Sig: INJECT 0.75 MG INTO THE SKIN ONCE A WEEK.     Endocrinology:  Diabetes - GLP-1 Receptor Agonists Passed - 05/17/2023  3:42 PM      Passed - HBA1C is between 0 and 7.9 and within 180 days    Hemoglobin A1C  Date Value Ref Range Status  02/10/2023 6.3 (A) 4.0 - 5.6 % Final   HbA1c POC (<> result, manual entry)  Date Value Ref Range Status  12/09/2021 10.3 4.0 - 5.6 % Final   Hgb A1c MFr Bld  Date Value Ref Range Status  02/10/2023 7.3 (H) <5.7 % of total Hgb Final    Comment:    For someone without known diabetes, a hemoglobin A1c value of 6.5% or greater indicates that they may have  diabetes and this should be confirmed with a follow-up  test. . For someone with known diabetes, a value <7% indicates  that their diabetes is well controlled and a value  greater than or equal to 7% indicates suboptimal  control. A1c targets should be individualized based on  duration of diabetes, age, comorbid conditions, and  other considerations. . Currently, no consensus exists regarding use of hemoglobin A1c for diagnosis of diabetes for children. Verna Czech - Valid encounter within last 6 months    Recent Outpatient Visits           2 months ago Longstanding persistent atrial fibrillation [facility])   [place] [facility] [place], [place] W, NP   3 months ago Type 2 diabetes mellitus with foot ulcer, without long-term current use of insulin [facility])   [place] [facility] [place], [place] W, NP   5 months ago Type 2 diabetes mellitus with foot ulcer, without long-term current use of insulin [facility])   [place] [facility] [place], [place] W, NP   6 months ago Type 2 diabetes mellitus with foot ulcer, without long-term current use of insulin [facility])   [place] [facility] [place], Salvadore Oxford, NP   7 months ago Reactive depression   [place] [facility] [place], Salvadore Oxford, NP       Future Appointments             In 2 months Baity, Salvadore Oxford, NP [facility]   In 3 months Sherie Don, NP  HeartCare at [facility]             DULoxetine (CYMBALTA) 30 MG capsule [Pharmacy Med Name: DULOXETINE HYDROCHLORIDE 30MG  CAPSULE DR PART] 180 capsule 0    Sig: TAKE ONE CAPSULE (30 MG TOTAL) BY MOUTH DAILY FOR 30 DAYS, THEN TWO CAPSULES (60 MG TOTAL) DAILY.     Psychiatry: Antidepressants - SNRI - duloxetine Failed - 05/17/2023  3:42 PM      Failed - Last BP in normal range    BP Readings from Last 1 Encounters:  05/17/23 (!) 177/91         Passed - Cr in normal range and within 360 days    Creat  Date Value Ref Range Status  08/04/2022 1.15 0.70 - 1.30 mg/dL Final   Creatinine, Ser  Date Value Ref Range Status  04/22/2023 1.15 0.61 - 1.24 mg/dL Final   Creatinine,  Urine  Date Value Ref Range Status  08/04/2022 71 20 - 320 mg/dL Final         Passed - eGFR is 30 or above and within 360 days    GFR, Est African American  Date Value Ref Range Status  08/05/2020 79 > OR = 60 mL/min/1.37m2 Final   GFR, Est Non African American  Date Value Ref Range Status  08/05/2020 69 > OR = 60 mL/min/1.64m2 Final   GFR, Estimated  Date Value Ref Range Status  04/22/2023 >60 >60 mL/min Final    Comment:    (NOTE) Calculated using the CKD-EPI Creatinine Equation (2021)    GFR  Date Value Ref Range Status  01/23/2020 67.67 >60.00 mL/min Final    Comment:    Calculated using the CKD-EPI Creatinine Equation (2021)   eGFR  Date Value Ref Range Status  11/30/2022 68 >59 mL/min/1.73 Final         Passed - Completed PHQ-2 or PHQ-9 in the last 360 days      Passed - Valid encounter within last 6 months    Recent Outpatient Visits           2 months ago Longstanding persistent atrial  fibrillation [facility])   [place] [facility] [place], [place] W, NP   3 months ago Type 2 diabetes mellitus with foot ulcer, without long-term current use of insulin [facility])   [place] [facility] [place], [place] W, NP   5 months ago Type 2 diabetes mellitus with foot ulcer, without long-term current use of insulin [facility])   [place] [facility] [place], [place] W, NP   6 months ago Type 2 diabetes mellitus with foot ulcer, without long-term current use of insulin [facility])   [place] [facility] [place], Salvadore Oxford, NP   7 months ago Reactive depression   [place] [facility] [place], Salvadore Oxford, NP       Future Appointments             In 2 months Baity, Salvadore Oxford, NP [place] [facility], [facility]   In 3 months Sherie Don, NP [facility]

## 2023-05-17 NOTE — Progress Notes (Signed)
 Electrophysiology Clinic Note    Date:  05/17/2023  Patient ID:  Anthony Castillo, Anthony Castillo [birth year], MRN [id number] PCP:  Lorre Munroe, NP  Cardiologist:  Julien Nordmann, MD Electrophysiologist: Nobie Putnam, MD   Discussed the use of AI scribe software for clinical note transcription with the patient, who gave verbal consent to proceed.   Patient Profile    Chief Complaint: AFib follow-up  History of Present Illness: Anthony Castillo is a 58 y.o. male with PMH notable for persis afib, non-obs CAD by imaging, OSA, HTN, T2DM, HFpEF ; seen today for Nobie Putnam, MD for routine electrophysiology followup.   He is s/p tikosyn loading admission 2/11-14/2025. He did require DCCV to convert to sinus rhythm. He saw PA Nelva Bush 1 week after discharge and was maintaining sinus rhythm with stable QTC .  On follow-up today, he reports slight improvement in fatigue since prior to initiating tikosyn, but not as drastic as he was expecting. He takes [place] 7a/7p uses alarm (and wife) to remember to take medications.  He continues to take eliquis BID, previously had some blood with BMs, no longer an issue. He has noticed intermittent constipation. He is on trulicity and has decreased appetite.   He was checking BP regularly at home, but has stopped lately. He is very anxious about whether he is in sinus rhythm today or not.     Arrhythmia/Device History Tikosyn - loaded 04/2023   ROS:  Please see the history of present illness. All other systems are reviewed and otherwise negative.    Physical Exam    VS:  BP (!) 177/91 (BP Location: Left Arm, Patient Position: Sitting, Cuff Size: Normal)   Pulse 73   Ht 6' (1.829 m)   Wt (!) 330 lb 6.4 oz (149.9 kg)   SpO2 96%   BMI 44.81 kg/m  BMI: Body mass index is 44.81 kg/m.  Wt Readings from Last 3 Encounters:  05/17/23 (!) 330 lb 6.4 oz (149.9 kg)  04/22/23 (!) 329 lb 12.8 oz (149.6 kg)  04/12/23 (!) 326 lb 1.6 oz (147.9 kg)      GEN- The patient is well appearing, alert and oriented x 3 today.   Lungs- Clear to ausculation bilaterally, normal work of breathing.  Heart- Regular rate and rhythm, no murmurs, rubs or gallops Extremities- Trace peripheral edema, warm, dry    Studies Reviewed   Previous EP, cardiology notes.    EKG is ordered. Personal review of EKG from today shows:    EKG Interpretation Date/Time:  Tuesday May 17 2023 13:25:10 EDT Ventricular Rate:  73 PR Interval:  186 QRS Duration:  92 QT Interval:  422 QTC Calculation: 464 R Axis:   10  Text Interpretation: Normal sinus rhythm Nonspecific T wave abnormality Confirmed by Sherie Don [phone]) on 05/17/2023 1:33:58 PM    04/22/2023 EKG - SB with 1st deg HB, QTC PR  TTE, 11/03/2022  1. Left ventricular ejection fraction, by estimation, is 55 to 60%. The left ventricle has normal function. The left ventricle has no regional wall motion abnormalities. Left ventricular diastolic parameters are consistent with Grade I diastolic dysfunction (impaired relaxation).   2. Right ventricular systolic function is normal. The right ventricular size is normal. Tricuspid regurgitation signal is inadequate for assessing PA pressure.   3. Left atrial size was mildly dilated.   4. The mitral valve is normal in structure. No evidence of mitral valve regurgitation. No evidence of mitral stenosis.  5. The aortic valve has an indeterminant number of cusps. Aortic valve regurgitation is mild. No aortic stenosis is present.   6. There is borderline dilatation of the ascending aorta, measuring 39 mm.   7. The inferior vena cava is normal in size with greater than 50% respiratory variability, suggesting right atrial pressure of 3 mmHg.    CT cardiac score, 04/11/2018 1. Coronary calcium score of 103. This was 17 percentile for age and sex matched control.  2. Mildly dilated pulmonary artery measuring 32 mm suggestive of pulmonary  hypertension.    Assessment and Plan     #) persis AFib #) high risk medication use - tikosyn Not AF ablation candidate d/t body habitus S/p Tikosyn loading admisison 04/2023, maintaining sinus EKG with stable QTC at today Encouraged to continue to use alarms to remember medication every 12 hours Consider zio to eval AFib burden if no improvement at follow-up   #) Hypercoag d/t persis afib CHA2DS2-VASc Score = at least 3 [CHF History: 0, HTN History: 1, Diabetes History: 1, Stroke History: 0, Vascular Disease History: 1, Age Score: 0, Gender Score: 0].  Therefore, the patient's annual risk of stroke is 3.2 %.    Stroke ppx - 5mg  eliquis BID, appropriately dosed No bleeding concerns currently Encourage to use miralax 200mg  daily or BID to have soft, formed BM    #) HTN Patient quite anxious today  Previously home readings were well-controlled Encouraged to continue to monitor BP 2-3 times per week and record measurements        Current medicines are reviewed at length with the patient today.   The patient has concerns regarding his medicines.  The following changes were made today:  none  Labs/ tests ordered today include:  Orders Placed This Encounter  Procedures   EKG 12-Lead     Disposition: Follow up with Dr. Jimmey Ralph or EP APP in 3 months, sooner if needed   Signed, Sherie Don, NP  05/17/23  2:41 PM  Electrophysiology [facility]

## 2023-05-17 NOTE — Patient Instructions (Addendum)
 Please resume checking BP 2-3 times per week and recording the measurements.  If fatigue does not improve, please let us know so we can order a EKG monitor to ensure you are in normal rhythm.    Medication Instructions:  The current medical regimen is effective;  continue present plan and medications.  *If you need a refill on your cardiac medications before your next appointment, please call your pharmacy*   Follow-Up: At [facility], you and your health needs are our priority.  As part of our continuing mission to provide you with exceptional heart care, we have created designated Provider Care Teams.  These Care Teams include your primary Cardiologist (physician) and Advanced Practice Providers (APPs -  Physician Assistants and Nurse Practitioners) who all work together to provide you with the care you need, when you need it.  We recommend signing up for the patient portal called "MyChart".  Sign up information is provided on this After Visit Summary.  MyChart is used to connect with patients for Virtual Visits (Telemedicine).  Patients are able to view lab/test results, encounter notes, upcoming appointments, etc.  Non-urgent messages can be sent to your provider as well.   To learn more about what you can do with MyChart, go to [email or web address].    Your next appointment:   3 month(s)  Provider:   Nobie Putnam, MD or Sherie Don, NP

## 2023-06-01 ENCOUNTER — Encounter: Payer: Self-pay | Admitting: Podiatry

## 2023-06-01 ENCOUNTER — Ambulatory Visit: Payer: Self-pay | Admitting: Podiatry

## 2023-06-01 DIAGNOSIS — M778 Other enthesopathies, not elsewhere classified: Secondary | ICD-10-CM

## 2023-06-01 DIAGNOSIS — L603 Nail dystrophy: Secondary | ICD-10-CM | POA: Diagnosis not present

## 2023-06-01 NOTE — Progress Notes (Signed)
 He presents today chief concern of his thick toenails hallux bilaterally there is discoloration and whether or not they may have fungus associated with them.  He is also concerned about his toenails #3 #4 thickened brittle left foot.  Objective: Vital signs are stable he is alert and oriented x 3 pulses are palpable.  Puncture wound to the plantar aspect of the right foot is healing uneventfully no signs of infection.  Left foot does demonstrate thick dystrophic clinically mycotic nails particularly in the third and fourth nail plates.  Assessment: Well-healing puncture wound right and possible nail dystrophy to toes 3 and 4 of the left foot.  Plan: Discussed etiology pathology conservative surgical therapies took samples of skin and nail today to be sent for pathologic evaluation I will follow-up with him in 30 days.  He takes Therapist, occupational for [employer]

## 2023-06-08 ENCOUNTER — Other Ambulatory Visit: Payer: Self-pay | Admitting: Internal Medicine

## 2023-06-09 NOTE — Telephone Encounter (Signed)
 Requested Prescriptions  Pending Prescriptions Disp Refills   empagliflozin (JARDIANCE) 25 MG TABS tablet [Pharmacy Med Name: JARDIANCE 25MG  TABLET] 90 tablet 0    Sig: TAKE ONE TABLET (25 MG TOTAL) BY MOUTH DAILY BEFORE BREAKFAST.     Endocrinology:  Diabetes - SGLT2 Inhibitors Failed - 06/09/2023  8:20 AM      Failed - Valid encounter within last 6 months    Recent Outpatient Visits   None     Future Appointments             In 2 months Baity, Salvadore Oxford, NP [facility], [facility]   In 2 months Sherie Don, NP [facility] at [facility] - Cr in normal range and within 360 days    Creat  Date Value Ref Range Status  08/04/2022 1.15 0.70 - 1.30 mg/dL Final   Creatinine, Ser  Date Value Ref Range Status  04/22/2023 1.15 0.61 - 1.24 mg/dL Final   Creatinine, Urine  Date Value Ref Range Status  08/04/2022 71 20 - 320 mg/dL Final         Passed - HBA1C is between 0 and 7.9 and within 180 days    Hemoglobin A1C  Date Value Ref Range Status  02/10/2023 6.3 (A) 4.0 - 5.6 % Final   HbA1c POC (<> result, manual entry)  Date Value Ref Range Status  12/09/2021 10.3 4.0 - 5.6 % Final   Hgb A1c MFr Bld  Date Value Ref Range Status  02/10/2023 7.3 (H) <5.7 % of total Hgb Final    Comment:    For someone without known diabetes, a hemoglobin A1c value of 6.5% or greater indicates that they may have  diabetes and this should be confirmed with a follow-up  test. . For someone with known diabetes, a value <7% indicates  that their diabetes is well controlled and a value  greater than or equal to 7% indicates suboptimal  control. A1c targets should be individualized based on  duration of diabetes, age, comorbid conditions, and  other considerations. . Currently, no consensus exists regarding use of hemoglobin A1c for diagnosis of diabetes for children. .          Passed - eGFR in normal range and within 360 days    GFR,  Est African American  Date Value Ref Range Status  08/05/2020 79 > OR = 60 mL/min/1.73m2 Final   GFR, Est Non African American  Date Value Ref Range Status  08/05/2020 69 > OR = 60 mL/min/1.90m2 Final   GFR, Estimated  Date Value Ref Range Status  04/22/2023 >60 >60 mL/min Final    Comment:    (NOTE) Calculated using the CKD-EPI Creatinine Equation (2021)    GFR  Date Value Ref Range Status  01/23/2020 67.67 >60.00 mL/min Final    Comment:    Calculated using the CKD-EPI Creatinine Equation (2021)   eGFR  Date Value Ref Range Status  11/30/2022 68 >59 mL/min/1.73 Final

## 2023-06-10 DIAGNOSIS — G4733 Obstructive sleep apnea (adult) (pediatric): Secondary | ICD-10-CM | POA: Diagnosis not present

## 2023-06-22 ENCOUNTER — Other Ambulatory Visit: Payer: Self-pay | Admitting: Internal Medicine

## 2023-06-22 DIAGNOSIS — M791 Myalgia, unspecified site: Secondary | ICD-10-CM

## 2023-06-22 DIAGNOSIS — M2550 Pain in unspecified joint: Secondary | ICD-10-CM

## 2023-06-23 NOTE — Telephone Encounter (Signed)
 Requested Prescriptions  Pending Prescriptions Disp Refills   celecoxib  (CELEBREX ) 100 MG capsule [Pharmacy Med Name: CELECOXIB  100MG  CAPSULE] 90 capsule 0    Sig: Take 1 capsule (100 mg total) by mouth every morning.     Analgesics:  COX2 Inhibitors Failed - 06/23/2023  9:17 AM      Failed - Manual Review: Labs are only required if the patient has taken medication for more than 8 weeks.      Failed - Valid encounter within last 12 months    Recent Outpatient Visits   None     Future Appointments             In 1 month Baity, Rankin Buzzard, NP [facility], [facility]   In 2 months Riddle, Suzann, NP [facility] - HGB in normal range and within 360 days    Hemoglobin  Date Value Ref Range Status  11/30/2022 15.0 13.0 - 17.7 g/dL Final         Passed - Cr in normal range and within 360 days    Creat  Date Value Ref Range Status  08/04/2022 1.15 0.70 - 1.30 mg/dL Final   Creatinine, Ser  Date Value Ref Range Status  04/22/2023 1.15 0.61 - 1.24 mg/dL Final   Creatinine, Urine  Date Value Ref Range Status  08/04/2022 71 20 - 320 mg/dL Final         Passed - HCT in normal range and within 360 days    Hematocrit  Date Value Ref Range Status  11/30/2022 47.7 37.5 - 51.0 % Final         Passed - AST in normal range and within 360 days    AST  Date Value Ref Range Status  08/04/2022 16 10 - 35 U/L Final         Passed - ALT in normal range and within 360 days    ALT  Date Value Ref Range Status  08/04/2022 12 9 - 46 U/L Final         Passed - eGFR is 30 or above and within 360 days    GFR, Est African American  Date Value Ref Range Status  08/05/2020 79 > OR = 60 mL/min/1.49m2 Final   GFR, Est Non African American  Date Value Ref Range Status  08/05/2020 69 > OR = 60 mL/min/1.45m2 Final   GFR, Estimated  Date Value Ref Range Status  04/22/2023 >60 >60 mL/min Final    Comment:     (NOTE) Calculated using the CKD-EPI Creatinine Equation (2021)    GFR  Date Value Ref Range Status  01/23/2020 67.67 >60.00 mL/min Final    Comment:    Calculated using the CKD-EPI Creatinine Equation (2021)   eGFR  Date Value Ref Range Status  11/30/2022 68 >59 mL/min/1.73 Final         Passed - Patient is not pregnant

## 2023-07-04 ENCOUNTER — Telehealth: Payer: Self-pay | Admitting: Podiatry

## 2023-07-04 NOTE — Telephone Encounter (Signed)
 Pt had to cancel appt for 5/7 due to work sch, but would like to know if you can do a telephone call or update in MyChart on lab results.

## 2023-07-05 ENCOUNTER — Other Ambulatory Visit: Payer: Self-pay | Admitting: Internal Medicine

## 2023-07-06 ENCOUNTER — Ambulatory Visit: Admitting: Podiatry

## 2023-07-07 NOTE — Telephone Encounter (Signed)
 Appointment scheduled- 08/12/23 Requested Prescriptions  Pending Prescriptions Disp Refills   lisinopril  (ZESTRIL ) 10 MG tablet [Pharmacy Med Name: LISINOPRIL  10MG  TABLET] 90 tablet 0    Sig: TAKE ONE TABLET (10 MG TOTAL) BY MOUTH DAILY.     Cardiovascular:  ACE Inhibitors Failed - 07/07/2023  2:34 PM      Failed - Last BP in normal range    BP Readings from Last 1 Encounters:  05/17/23 (!) 177/91         Failed - Valid encounter within last 6 months    Recent Outpatient Visits   None     Future Appointments             In 1 month Baity, Rankin Buzzard, NP [facility], [facility]   In 1 month Riddle, Suzann, NP [place] [facility] - Cr in normal range and within 180 days    Creat  Date Value Ref Range Status  08/04/2022 1.15 0.70 - 1.30 mg/dL Final   Creatinine, Ser  Date Value Ref Range Status  04/22/2023 1.15 0.61 - 1.24 mg/dL Final   Creatinine, Urine  Date Value Ref Range Status  08/04/2022 71 20 - 320 mg/dL Final         Passed - K in normal range and within 180 days    Potassium  Date Value Ref Range Status  04/22/2023 4.0 3.5 - 5.1 mmol/L Final         Passed - Patient is not pregnant

## 2023-07-10 DIAGNOSIS — G4733 Obstructive sleep apnea (adult) (pediatric): Secondary | ICD-10-CM | POA: Diagnosis not present

## 2023-07-11 ENCOUNTER — Other Ambulatory Visit: Payer: Self-pay | Admitting: Cardiovascular Disease

## 2023-07-11 DIAGNOSIS — G4733 Obstructive sleep apnea (adult) (pediatric): Secondary | ICD-10-CM | POA: Diagnosis not present

## 2023-08-01 ENCOUNTER — Ambulatory Visit: Admitting: Podiatry

## 2023-08-01 ENCOUNTER — Encounter: Payer: Self-pay | Admitting: Podiatry

## 2023-08-01 DIAGNOSIS — L603 Nail dystrophy: Secondary | ICD-10-CM

## 2023-08-01 MED ORDER — TERBINAFINE HCL 250 MG PO TABS: 250.0000 mg | ORAL_TABLET | Freq: Every day | ORAL | 0 refills | Status: DC

## 2023-08-02 NOTE — Progress Notes (Signed)
 He presents today for follow-up of his nail pathology.  Objective: Toenails are thick yellow dystrophic mycotic and pathology does demonstrate a saprophytic fungus with nail dystrophy.  Assessment: Onychomycosis with nail dystrophy.  Plan: Discussed topical therapy laser therapy and oral therapy.  At this point we are going to start him on oral oral Lamisil 1 tablet daily.  250 mg tablets #30 and we will follow-up with him in 1 month for blood work.  We did discuss the pros and cons of this medication possible side effects associated with it he understands this is amenable to it we will follow-up with me in 1 month or call with questions

## 2023-08-08 ENCOUNTER — Other Ambulatory Visit: Payer: Self-pay | Admitting: Internal Medicine

## 2023-08-09 NOTE — Telephone Encounter (Signed)
 Requested Prescriptions  Pending Prescriptions Disp Refills   TRULICITY  0.75 MG/0.5ML SOAJ [Pharmacy Med Name: TRULICITY  0.75/0.5 SOLN AUTO-INJ] 6 mL 0    Sig: INJECT 0.75 MG INTO THE SKIN ONCE A WEEK.     Endocrinology:  Diabetes - GLP-1 Receptor Agonists Failed - 08/09/2023 11:54 AM      Failed - Valid encounter within last 6 months    Recent Outpatient Visits   None     Future Appointments             In 3 days Baity, Rankin Buzzard, NP [facility], [facility]   In 2 weeks Riddle, Suzann, NP [facility] at [facility] - HBA1C is between 0 and 7.9 and within 180 days    Hemoglobin A1C  Date Value Ref Range Status  02/10/2023 6.3 (A) 4.0 - 5.6 % Final   HbA1c POC (<> result, manual entry)  Date Value Ref Range Status  12/09/2021 10.3 4.0 - 5.6 % Final   Hgb A1c MFr Bld  Date Value Ref Range Status  02/10/2023 7.3 (H) <5.7 % of total Hgb Final    Comment:    For someone without known diabetes, a hemoglobin A1c value of 6.5% or greater indicates that they may have  diabetes and this should be confirmed with a follow-up  test. . For someone with known diabetes, a value <7% indicates  that their diabetes is well controlled and a value  greater than or equal to 7% indicates suboptimal  control. A1c targets should be individualized based on  duration of diabetes, age, comorbid conditions, and  other considerations. . Currently, no consensus exists regarding use of hemoglobin A1c for diagnosis of diabetes for children. Aaron Aas

## 2023-08-10 DIAGNOSIS — G4733 Obstructive sleep apnea (adult) (pediatric): Secondary | ICD-10-CM | POA: Diagnosis not present

## 2023-08-12 ENCOUNTER — Ambulatory Visit (INDEPENDENT_AMBULATORY_CARE_PROVIDER_SITE_OTHER): Payer: Self-pay | Admitting: Internal Medicine

## 2023-08-12 ENCOUNTER — Encounter: Payer: Self-pay | Admitting: Internal Medicine

## 2023-08-12 VITALS — BP 138/78 | HR 71 | Ht 72.0 in | Wt 329.8 lb

## 2023-08-12 DIAGNOSIS — E11621 Type 2 diabetes mellitus with foot ulcer: Secondary | ICD-10-CM | POA: Diagnosis not present

## 2023-08-12 DIAGNOSIS — R5382 Chronic fatigue, unspecified: Secondary | ICD-10-CM

## 2023-08-12 DIAGNOSIS — E559 Vitamin D deficiency, unspecified: Secondary | ICD-10-CM | POA: Diagnosis not present

## 2023-08-12 DIAGNOSIS — Z7984 Long term (current) use of oral hypoglycemic drugs: Secondary | ICD-10-CM | POA: Diagnosis not present

## 2023-08-12 DIAGNOSIS — E291 Testicular hypofunction: Secondary | ICD-10-CM | POA: Diagnosis not present

## 2023-08-12 DIAGNOSIS — L97509 Non-pressure chronic ulcer of other part of unspecified foot with unspecified severity: Secondary | ICD-10-CM | POA: Diagnosis not present

## 2023-08-12 DIAGNOSIS — Z0001 Encounter for general adult medical examination with abnormal findings: Secondary | ICD-10-CM | POA: Diagnosis not present

## 2023-08-12 DIAGNOSIS — Z6841 Body Mass Index (BMI) 40.0 and over, adult: Secondary | ICD-10-CM | POA: Diagnosis not present

## 2023-08-12 DIAGNOSIS — Z125 Encounter for screening for malignant neoplasm of prostate: Secondary | ICD-10-CM | POA: Diagnosis not present

## 2023-08-12 DIAGNOSIS — E538 Deficiency of other specified B group vitamins: Secondary | ICD-10-CM | POA: Diagnosis not present

## 2023-08-12 DIAGNOSIS — E66813 Obesity, class 3: Secondary | ICD-10-CM | POA: Diagnosis not present

## 2023-08-12 NOTE — Progress Notes (Signed)
 Subjective:    Patient ID: Anthony Castillo, male    DOB: [birth year], 58 y.o.   MRN: [id number]  HPI  Patient presents to clinic today for his annual exam.  Flu: 10/2022 Tetanus: 07/2016 COVID: Moderna x 2 Pneumovax: 03/2021 Prevnar: 11/2017 Shingrix: 03/2018, 09/2018 PSA screening: 07/2022 Colon screening: > 10 years ago Vision screening: annually Dentist: as needed  Diet: He does eat meat. He consumes fruits and veggies. He does eat fried foods. He drinks mostly water, soda. Exercise: Farming  Review of Systems     Past Medical History:  Diagnosis Date   Chewing tobacco dependence    Chronic pain 04/09/2011   Coronary artery calcification seen on CT scan    a. 04/2018 Cardiac CT: Ca2+ = 103 (91st %'ile).   DEPRESSION 03/16/2007   Diastolic dysfunction    a. 10/2022 Echo: EF 55-60%, no rwma, GrI DD, nl RV size/fxn, mildly dil LA, mild AI. Asc Ao 39mm.   GERD (gastroesophageal reflux disease)    Hypogonadism male 04/09/2011   Mixed hyperlipidemia    Morbid obesity (HCC) 09/22/2006   Osteoarthritis 04/09/2011   Persistent atrial fibrillation (HCC)    a. 11/2022 s/p DCCV 150J->200J->200J->200J A/P->200J A/P--> RSR; b. CHA2DS2VASc = 2 (HTN/Cor Ca2+).   Primary hypertension    Sleep apnea    a. Not using CPAP.   Wears glasses     Current Outpatient Medications  Medication Sig Dispense Refill   Acetaminophen  (TYLENOL  ARTHRITIS PAIN PO) Take 1,300 mg by mouth at bedtime.     albuterol  (VENTOLIN  HFA) 108 (90 Base) MCG/ACT inhaler Inhale 2 puffs into the lungs every 6 (six) hours as needed for wheezing or shortness of breath. 8 g 6   apixaban  (ELIQUIS ) 5 MG TABS tablet Take 1 tablet (5 mg total) by mouth 2 (two) times daily. 180 tablet 3   buPROPion  (WELLBUTRIN  SR) 150 MG 12 hr tablet Take 1 tablet (150 mg total) by mouth 2 (two) times daily. 180 tablet 1   celecoxib  (CELEBREX ) 100 MG capsule Take 1 capsule (100 mg total) by mouth every morning. 90 capsule 0   cetirizine  (ZYRTEC) 10 MG tablet Take 10 mg by mouth daily.     Cholecalciferol (VITAMIN D3) 1000 units CAPS Take 1 capsule by mouth daily.     Continuous Glucose Sensor (DEXCOM G7 SENSOR) MISC APPLY ONE SENSOR TRANSDERMALLY EVERY TEN DAYS 6 each 1   diltiazem  (CARDIZEM  CD) 180 MG 24 hr capsule Take 1 capsule (180 mg total) by mouth daily. 90 capsule 3   diltiazem  (TIAZAC ) 180 MG 24 hr capsule Take 180 mg by mouth daily.     dofetilide  (TIKOSYN ) 500 MCG capsule Take 1 capsule (500 mcg total) by mouth 2 (two) times daily. 60 capsule 3   DULoxetine  (CYMBALTA ) 30 MG capsule TAKE ONE CAPSULE (30 MG TOTAL) BY MOUTH DAILY FOR 30 DAYS, THEN TWO CAPSULES (60 MG TOTAL) DAILY. 180 capsule 0   empagliflozin  (JARDIANCE ) 25 MG TABS tablet TAKE ONE TABLET (25 MG TOTAL) BY MOUTH DAILY BEFORE BREAKFAST. 90 tablet 0   ezetimibe  (ZETIA ) 10 MG tablet TAKE ONE TABLET (10 MG TOTAL) BY MOUTH DAILY. 90 tablet 3   furosemide  (LASIX ) 20 MG tablet Take 1 tablet (20 mg total) by mouth daily. 90 tablet 3   glipiZIDE  (GLUCOTROL ) 10 MG tablet TAKE ONE TABLET BY MOUTH TWICE A DAY BEFORE A MEAL. 180 tablet 1   JANUVIA  100 MG tablet TAKE ONE TABLET BY MOUTH ONCE A DAY 90 tablet  0   lisinopril  (ZESTRIL ) 10 MG tablet TAKE ONE TABLET (10 MG TOTAL) BY MOUTH DAILY. 90 tablet 0   metoprolol  succinate (TOPROL -XL) 50 MG 24 hr tablet Take 1 tablet (50 mg total) by mouth daily. Take with or immediately following a meal. 90 tablet 3   pantoprazole  (PROTONIX ) 40 MG tablet TAKE ONE TABLET BY MOUTH ONCE A DAY 90 tablet 2   potassium chloride  SA (KLOR-CON  M) 20 MEQ tablet Take 1 tablet (20 mEq total) by mouth daily. 30 tablet 6   rosuvastatin  (CRESTOR ) 10 MG tablet TAKE ONE TABLET (10 MG TOTAL) BY MOUTH DAILY. 90 tablet 0   tamsulosin  (FLOMAX ) 0.4 MG CAPS capsule TAKE ONE CAPSULE BY MOUTH ONCE DAILY 90 capsule 1   terbinafine  (LAMISIL ) 250 MG tablet Take 1 tablet (250 mg total) by mouth daily. 30 tablet 0   TRULICITY  0.75 MG/0.5ML SOAJ INJECT 0.75 MG INTO  THE SKIN ONCE A WEEK. 6 mL 0   No current facility-administered medications for this visit.    Allergies  Allergen Reactions   Penicillins Other (See Comments)    REACTION: unspecified    Family History  Problem Relation Age of Onset   Lymphoma Mother    Heart disease Father    Hypertension Father    Diabetes Sister    Diabetes Maternal Grandmother    Arthritis Paternal Grandmother    Heart disease Paternal Grandfather    Migraines Daughter    Irritable bowel syndrome Daughter    Heart Problems Son        valve    Stroke Paternal Uncle    Colon cancer Neg Hx    Prostate cancer Neg Hx     Social History   Socioeconomic History   Marital status: Married    Spouse name: Not on file   Number of children: Not on file   Years of education: Not on file   Highest education level: Not on file  Occupational History   Not on file  Tobacco Use   Smoking status: Never    Passive exposure: Never   Smokeless tobacco: Current    Types: Chew   Tobacco comments:    has been using chew 40 years  Vaping Use   Vaping status: Never Used  Substance and Sexual Activity   Alcohol use: Yes    Comment: rare   Drug use: Never   Sexual activity: Yes  Other Topics Concern   Not on file  Social History Narrative   Not on file   Social Drivers of Health   Financial Resource Strain: Patient Declined (02/10/2023)   Overall Financial Resource Strain (CARDIA)    Difficulty of Paying Living Expenses: Patient declined  Food Insecurity: No Food Insecurity (04/18/2023)   Hunger Vital Sign    Worried About Running Out of Food in the Last Year: Never true    Ran Out of Food in the Last Year: Never true  Transportation Needs: No Transportation Needs (04/18/2023)   PRAPARE - Administrator, Civil Service (Medical): No    Lack of Transportation (Non-Medical): No  Physical Activity: Patient Declined (02/10/2023)   Exercise Vital Sign    Days of Exercise per Week: Patient declined     Minutes of Exercise per Session: Patient declined  Stress: Patient Declined (02/10/2023)   [employer] of Occupational Health - Occupational Stress Questionnaire    Feeling of Stress : Patient declined  Social Connections: Patient Declined (02/10/2023)   Social Connection and Isolation Panel  Frequency of Communication with Friends and Family: Patient declined    Frequency of Social Gatherings with Friends and Family: Patient declined    Attends Religious Services: Patient declined    Database administrator or Organizations: Patient declined    Attends Banker Meetings: Patient declined    Marital Status: Patient declined  Intimate Partner Violence: Not At Risk (04/18/2023)   Humiliation, Afraid, Rape, and Kick questionnaire    Fear of Current or Ex-Partner: No    Emotionally Abused: No    Physically Abused: No    Sexually Abused: No     Constitutional: Patient reports fatigue.  Denies fever, malaise, fatigue, headache or abrupt weight changes.  HEENT: Denies eye pain, eye redness, ear pain, ringing in the ears, wax buildup, runny nose, nasal congestion, bloody nose, or sore throat. Respiratory: Pt reports intermittent shortness of breath. Denies difficulty breathing, cough or sputum production.   Cardiovascular: Denies chest pain, chest tightness, palpitations or swelling in the hands or feet.  Gastrointestinal: Patient reports intermittent constipation.  Denies abdominal pain, bloating, diarrhea or blood in the stool.  GU: Patient reports nocturia.  Denies urgency, frequency, pain with urination, burning sensation, blood in urine, odor or discharge. Musculoskeletal: Patient reports chronic joint pain.  Denies decrease in range of motion, difficulty with gait, muscle pain or joint swelling.  Skin: Denies redness, rashes, lesions or ulcercations.  Neurological: Denies dizziness, difficulty with memory, difficulty with speech or problems with balance and  coordination.  Psych: Patient has a history of depression.  Denies anxiety, SI/HI.  No other specific complaints in a complete review of systems (except as listed in HPI above).  Objective:   Physical Exam BP 138/78   Pulse 71   Ht 6' (1.829 m)   Wt (!) 329 lb 12.8 oz (149.6 kg)   SpO2 99%   BMI 44.73 kg/m    Wt Readings from Last 3 Encounters:  05/17/23 (!) 330 lb 6.4 oz (149.9 kg)  04/22/23 (!) 329 lb 12.8 oz (149.6 kg)  04/12/23 (!) 326 lb 1.6 oz (147.9 kg)    General: Appears his stated age, obese, in NAD. Skin: Warm, dry and intact. Sun damaged skin noted.  No ulcerations noted. HEENT: Head: normal shape and size; Eyes: sclera white, no icterus, conjunctiva pink, PERRLA and EOMs intact;  Neck:  Neck supple, trachea midline. No masses, lumps or thyromegaly present.  Cardiovascular: Normal rate and rhythm. S1,S2 noted.  No murmur, rubs or gallops noted. No JVD . Trace pitting BLE edema. No carotid bruits noted. Pulmonary/Chest: Normal effort and positive vesicular breath sounds. No respiratory distress. No wheezes, rales or ronchi noted.  Abdomen: Soft and nontender. Normal bowel sounds.  Musculoskeletal: Strength 5/5 BUE/BLE.  No difficulty with gait.  Neurological: Alert and oriented. Cranial nerves II-XII grossly intact. Coordination normal.  Psychiatric: Mood and affect normal. Behavior is normal. Judgment and thought content normal.   BMET    Component Value Date/Time   NA 137 04/22/2023 0909   NA 136 11/30/2022 0951   K 4.0 04/22/2023 0909   CL 102 04/22/2023 0909   CO2 27 04/22/2023 0909   GLUCOSE 107 (H) 04/22/2023 0909   BUN 8 04/22/2023 0909   BUN 14 11/30/2022 0951   CREATININE 1.15 04/22/2023 0909   CREATININE 1.15 08/04/2022 1149   CALCIUM  9.0 04/22/2023 0909   GFRNONAA >60 04/22/2023 0909   GFRNONAA 69 08/05/2020 0955   GFRAA 79 08/05/2020 0955    Lipid Panel  Component Value Date/Time   CHOL 104 02/10/2023 0833   TRIG 211 (H) 02/10/2023  0833   HDL 34 (L) 02/10/2023 0833   CHOLHDL 3.1 02/10/2023 0833   VLDL 28.0 01/23/2020 1413   LDLCALC 42 02/10/2023 0833    CBC    Component Value Date/Time   WBC 5.5 11/30/2022 0951   WBC 6.0 08/04/2022 1149   RBC 5.69 11/30/2022 0951   RBC 5.55 08/04/2022 1149   HGB 15.0 11/30/2022 0951   HCT 47.7 11/30/2022 0951   PLT 217 11/30/2022 0951   MCV 84 11/30/2022 0951   MCH 26.4 (L) 11/30/2022 0951   MCH 26.3 (L) 08/04/2022 1149   MCHC 31.4 (L) 11/30/2022 0951   MCHC 32.4 08/04/2022 1149   RDW 14.7 11/30/2022 0951   LYMPHSABS 1.3 04/15/2021 1024   EOSABS 0.1 04/15/2021 1024   BASOSABS 0.1 04/15/2021 1024    Hgb A1C Lab Results  Component Value Date   HGBA1C 7.3 (H) 02/10/2023           Assessment & Plan:   Preventative Health Maintenance:  Encouraged him to get a flu shot in the fall Tetanus UTD Encouraged to get his COVID booster Pneumovax and Prevnar UTD Shingrix UTD Colon screening declined Encouraged him to consume a balanced diet and exercise regimen Advised him seeing eye doctor and dentist annually We will check ABC, c-Met, lipid, A1c, urine microalbumin and PSA today  Fatigue, Vit D deficiency, Vit B12 Deficiency:  Will check testosterone , vitamin D  and B12  RTC in 6 months, follow-up chronic conditions Helayne Lo, NP

## 2023-08-12 NOTE — Assessment & Plan Note (Signed)
 Encourage diet and exercise for weight loss

## 2023-08-12 NOTE — Patient Instructions (Signed)
 Health Maintenance, Male  Adopting a healthy lifestyle and getting preventive care are important in promoting health and wellness. Ask your health care provider about:  The right schedule for you to have regular tests and exams.  Things you can do on your own to prevent diseases and keep yourself healthy.  What should I know about diet, weight, and exercise?  Eat a healthy diet    Eat a diet that includes plenty of vegetables, fruits, low-fat dairy products, and lean protein.  Do not eat a lot of foods that are high in solid fats, added sugars, or sodium.  Maintain a healthy weight  Body mass index (BMI) is a measurement that can be used to identify possible weight problems. It estimates body fat based on height and weight. Your health care provider can help determine your BMI and help you achieve or maintain a healthy weight.  Get regular exercise  Get regular exercise. This is one of the most important things you can do for your health. Most adults should:  Exercise for at least 150 minutes each week. The exercise should increase your heart rate and make you sweat (moderate-intensity exercise).  Do strengthening exercises at least twice a week. This is in addition to the moderate-intensity exercise.  Spend less time sitting. Even light physical activity can be beneficial.  Watch cholesterol and blood lipids  Have your blood tested for lipids and cholesterol at 60 years of age, then have this test every 5 years.  You may need to have your cholesterol levels checked more often if:  Your lipid or cholesterol levels are high.  You are older than 61 years of age.  You are at high risk for heart disease.  What should I know about cancer screening?  Many types of cancers can be detected early and may often be prevented. Depending on your health history and family history, you may need to have cancer screening at various ages. This may include screening for:  Colorectal cancer.  Prostate cancer.  Skin cancer.  Lung  cancer.  What should I know about heart disease, diabetes, and high blood pressure?  Blood pressure and heart disease  High blood pressure causes heart disease and increases the risk of stroke. This is more likely to develop in people who have high blood pressure readings or are overweight.  Talk with your health care provider about your target blood pressure readings.  Have your blood pressure checked:  Every 3-5 years if you are 9-95 years of age.  Every year if you are 85 years old or older.  If you are between the ages of 29 and 29 and are a current or former smoker, ask your health care provider if you should have a one-time screening for abdominal aortic aneurysm (AAA).  Diabetes  Have regular diabetes screenings. This checks your fasting blood sugar level. Have the screening done:  Once every three years after age 23 if you are at a normal weight and have a low risk for diabetes.  More often and at a younger age if you are overweight or have a high risk for diabetes.  What should I know about preventing infection?  Hepatitis B  If you have a higher risk for hepatitis B, you should be screened for this virus. Talk with your health care provider to find out if you are at risk for hepatitis B infection.  Hepatitis C  Blood testing is recommended for:  Everyone born from [birth year] through 1967.  Anyone  with known risk factors for hepatitis C.  Sexually transmitted infections (STIs)  You should be screened each year for STIs, including gonorrhea and chlamydia, if:  You are sexually active and are younger than 60 years of age.  You are older than 60 years of age and your health care provider tells you that you are at risk for this type of infection.  Your sexual activity has changed since you were last screened, and you are at increased risk for chlamydia or gonorrhea. Ask your health care provider if you are at risk.  Ask your health care provider about whether you are at high risk for HIV. Your health care provider  may recommend a prescription medicine to help prevent HIV infection. If you choose to take medicine to prevent HIV, you should first get tested for HIV. You should then be tested every 3 months for as long as you are taking the medicine.  Follow these instructions at home:  Alcohol use  Do not drink alcohol if your health care provider tells you not to drink.  If you drink alcohol:  Limit how much you have to 0-2 drinks a day.  Know how much alcohol is in your drink. In the [place] one drink equals one 12 oz bottle of beer (355 mL), one 5 oz glass of wine (148 mL), or one 1 oz glass of hard liquor (44 mL).  Lifestyle  Do not use any products that contain nicotine or tobacco. These products include cigarettes, chewing tobacco, and vaping devices, such as e-cigarettes. If you need help quitting, ask your health care provider.  Do not use street drugs.  Do not share needles.  Ask your health care provider for help if you need support or information about quitting drugs.  General instructions  Schedule regular health, dental, and eye exams.  Stay current with your vaccines.  Tell your health care provider if:  You often feel depressed.  You have ever been abused or do not feel safe at home.  Summary  Adopting a healthy lifestyle and getting preventive care are important in promoting health and wellness.  Follow your health care provider's instructions about healthy diet, exercising, and getting tested or screened for diseases.  Follow your health care provider's instructions on monitoring your cholesterol and blood pressure.  This information is not intended to replace advice given to you by your health care provider. Make sure you discuss any questions you have with your health care provider.  Document Revised: 07/07/2020 Document Reviewed: 07/07/2020  Elsevier Patient Education  2024 [employer].

## 2023-08-13 LAB — CBC
HCT: 45.5 % (ref 38.5–50.0)
Hemoglobin: 14.4 g/dL (ref 13.2–17.1)
MCH: 26.8 pg — ABNORMAL LOW (ref 27.0–33.0)
MCHC: 31.6 g/dL — ABNORMAL LOW (ref 32.0–36.0)
MCV: 84.7 fL (ref 80.0–100.0)
MPV: 10.6 fL (ref 7.5–12.5)
Platelets: 199 10*3/uL (ref 140–400)
RBC: 5.37 10*6/uL (ref 4.20–5.80)
RDW: 14.7 % (ref 11.0–15.0)
WBC: 6.1 10*3/uL (ref 3.8–10.8)

## 2023-08-13 LAB — COMPREHENSIVE METABOLIC PANEL WITH GFR
AG Ratio: 1.6 (calc) (ref 1.0–2.5)
ALT: 13 U/L (ref 9–46)
AST: 16 U/L (ref 10–35)
Albumin: 4.2 g/dL (ref 3.6–5.1)
Alkaline phosphatase (APISO): 86 U/L (ref 35–144)
BUN/Creatinine Ratio: 11 (calc) (ref 6–22)
BUN: 15 mg/dL (ref 7–25)
CO2: 24 mmol/L (ref 20–32)
Calcium: 9 mg/dL (ref 8.6–10.3)
Chloride: 101 mmol/L (ref 98–110)
Creat: 1.33 mg/dL — ABNORMAL HIGH (ref 0.70–1.30)
Globulin: 2.6 g/dL (ref 1.9–3.7)
Glucose, Bld: 175 mg/dL — ABNORMAL HIGH (ref 65–139)
Potassium: 3.9 mmol/L (ref 3.5–5.3)
Sodium: 135 mmol/L (ref 135–146)
Total Bilirubin: 0.6 mg/dL (ref 0.2–1.2)
Total Protein: 6.8 g/dL (ref 6.1–8.1)
eGFR: 62 mL/min/{1.73_m2} (ref >=60)

## 2023-08-13 LAB — MICROALBUMIN / CREATININE URINE RATIO
Creatinine, Urine: 27 mg/dL (ref 20–320)
Microalb Creat Ratio: 19 mg/g{creat} (ref <30)
Microalb, Ur: 0.5 mg/dL

## 2023-08-13 LAB — HEMOGLOBIN A1C
Hgb A1c MFr Bld: 6.6 % — ABNORMAL HIGH (ref <5.7)
Mean Plasma Glucose: 143 mg/dL
eAG (mmol/L): 7.9 mmol/L

## 2023-08-13 LAB — LIPID PANEL
Cholesterol: 97 mg/dL (ref <200)
HDL: 26 mg/dL — ABNORMAL LOW (ref >=40)
LDL Cholesterol (Calc): 36 mg/dL
Non-HDL Cholesterol (Calc): 71 mg/dL (ref <130)
Total CHOL/HDL Ratio: 3.7 (calc) (ref <5.0)
Triglycerides: 331 mg/dL — ABNORMAL HIGH (ref <150)

## 2023-08-13 LAB — TESTOSTERONE: Testosterone: 172 ng/dL — ABNORMAL LOW (ref 250–827)

## 2023-08-13 LAB — VITAMIN D 25 HYDROXY (VIT D DEFICIENCY, FRACTURES): Vit D, 25-Hydroxy: 57 ng/mL (ref 30–100)

## 2023-08-13 LAB — VITAMIN B12: Vitamin B-12: 462 pg/mL (ref 200–1100)

## 2023-08-13 LAB — PSA: PSA: 0.44 ng/mL (ref <=4.00)

## 2023-08-15 ENCOUNTER — Ambulatory Visit: Payer: Self-pay | Admitting: Internal Medicine

## 2023-08-15 DIAGNOSIS — G4733 Obstructive sleep apnea (adult) (pediatric): Secondary | ICD-10-CM | POA: Diagnosis not present

## 2023-08-17 ENCOUNTER — Ambulatory Visit: Admitting: Cardiology

## 2023-08-22 ENCOUNTER — Encounter: Payer: Self-pay | Admitting: Cardiology

## 2023-08-22 NOTE — Progress Notes (Unsigned)
 Electrophysiology Clinic Note    Date:  08/23/2023  Patient ID:  Anthony Castillo, Anthony Castillo 1965/03/16, MRN [id number] PCP:  Antonette Angeline ORN, NP  Cardiologist:  Evalene Lunger, MD Electrophysiologist: Fonda Kitty, MD   Discussed the use of AI scribe software for clinical note transcription with the patient, who gave verbal consent to proceed.   Patient Profile    Chief Complaint: AFib, tikosyn  follow-up  History of Present Illness: Anthony Castillo is a 58 y.o. 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   He is s/p tikosyn  loading admission 2/11-14/2025. He did require DCCV to convert to sinus rhythm. He saw PA Terra 1 week after discharge and was maintaining sinus rhythm. I last saw him 04/2023 where he was maintaining sinus rhythm.   On follow-up today, he is not aware of any AFib episodes. Continues to take tikosyn  BID without missing doses. He continues to have daytime fatigue, has seen PCP for this. He was hopeful fatigue would improve with maintaining sinus rhythm,.  Uses CPAP diligently nightly.  No bleeding concerns on eliquis .  He denies chest pain, chest pressure, palpitations, increased edema.    Wife and young granddaughter join for today's visit.   Arrhythmia/Device History Tikosyn  - loaded 04/2023   ROS:  Please see the history of present illness. All other systems are reviewed and otherwise negative.    Physical Exam    VS:  BP 138/78 (BP Location: Left Arm, Patient Position: Sitting, Cuff Size: Large)   Pulse 66   Ht 6' (1.829 m)   Wt (!) 327 lb 6 oz (148.5 kg)   SpO2 97%   BMI 44.40 kg/m  BMI: Body mass index is 44.4 kg/m.  Wt Readings from Last 3 Encounters:  08/23/23 (!) 327 lb 6 oz (148.5 kg)  08/12/23 (!) 329 lb 12.8 oz (149.6 kg)  05/17/23 (!) 330 lb 6.4 oz (149.9 kg)     GEN- The patient is well appearing, alert and oriented x 3  today.   Lungs- Clear to ausculation bilaterally, normal work of breathing.  Heart- Regular rate and rhythm, no murmurs, rubs or gallops Extremities- Trace peripheral edema, warm, dry    Studies Reviewed   Previous EP, cardiology notes.    EKG is ordered. Personal review of EKG from today shows:    EKG Interpretation Date/Time:  Tuesday August 23 2023 09:36:18 EDT Ventricular Rate:  66 PR Interval:  176 QRS Duration:  92 QT Interval:  450 QTC Calculation: 472 R Axis:   -14  Text Interpretation: Normal sinus rhythm Nonspecific T wave abnormality Confirmed by Arvada Seaborn [phone]) on 08/23/2023 9:40:51 AM    05/17/2023 EKG SR at 73, QTC   04/22/2023 EKG - SB with 1st deg HB, QTC PR  TTE, 11/03/2022  1. Left ventricular ejection fraction, by estimation, is 55 to 60%. The left ventricle has normal function. The left ventricle has no regional wall motion abnormalities. Left ventricular diastolic parameters are consistent with Grade I diastolic dysfunction (impaired relaxation).   2. Right ventricular systolic function is normal. The right ventricular size is normal. Tricuspid regurgitation signal is inadequate for assessing PA pressure.   3. Left atrial size was mildly dilated.   4. The mitral valve is normal in structure. No evidence of mitral valve regurgitation. No evidence of mitral stenosis.   5. The aortic valve has  an indeterminant number of cusps. Aortic valve regurgitation is mild. No aortic stenosis is present.   6. There is borderline dilatation of the ascending aorta, measuring 39 mm.   7. The inferior vena cava is normal in size with greater than 50% respiratory variability, suggesting right atrial pressure of 3 mmHg.    CT cardiac score, 04/11/2018 1. Coronary calcium  score of 103. This was 36 percentile for age and sex matched control.  2. Mildly dilated pulmonary artery measuring 32 mm suggestive of pulmonary hypertension.    Assessment and Plan      #) persis AFib #) high risk medication use - tikosyn  Not AF ablation candidate d/t body habitus S/p Tikosyn  loading admisison 04/2023, maintaining sinus EKG with stable QTC today Encouraged to continue to use alarms to remember medication every 12 hours Recent labwork with stable K, Cr  #) Hypercoag d/t persis afib CHA2DS2-VASc Score = at least 3 [CHF History: 0, HTN History: 1, Diabetes History: 1, Stroke History: 0, Vascular Disease History: 1, Age Score: 0, Gender Score: 0].  Therefore, the patient's annual risk of stroke is 3.2 %.    Stroke ppx - 5mg  eliquis  BID, appropriately dosed No bleeding concerns currently Encourage to use miralax 200mg  daily or BID to have soft, formed BM  #) HTN Well-controlled today in office  Continue to follow-up with PCP for BP mgmt.     Current medicines are reviewed at length with the patient today.   The patient does not have concerns regarding his medicines.  The following changes were made today:  none  Labs/ tests ordered today include:  Orders Placed This Encounter  Procedures   EKG 12-Lead     Disposition: Follow up with Dr. Kennyth or EP APP in 3 months, sooner if needed   Signed, Chantal Needle, NP  08/23/23  9:54 AM  Electrophysiology [facility]

## 2023-08-23 ENCOUNTER — Other Ambulatory Visit: Payer: Self-pay | Admitting: Internal Medicine

## 2023-08-23 ENCOUNTER — Encounter: Payer: Self-pay | Admitting: Cardiology

## 2023-08-23 ENCOUNTER — Telehealth: Payer: Self-pay | Admitting: Cardiovascular Disease

## 2023-08-23 ENCOUNTER — Other Ambulatory Visit: Payer: Self-pay | Admitting: Podiatry

## 2023-08-23 ENCOUNTER — Ambulatory Visit: Attending: Cardiology | Admitting: Cardiology

## 2023-08-23 VITALS — BP 138/78 | HR 66 | Ht 72.0 in | Wt 327.4 lb

## 2023-08-23 DIAGNOSIS — Z5181 Encounter for therapeutic drug level monitoring: Secondary | ICD-10-CM | POA: Diagnosis not present

## 2023-08-23 DIAGNOSIS — I4819 Other persistent atrial fibrillation: Secondary | ICD-10-CM | POA: Diagnosis not present

## 2023-08-23 DIAGNOSIS — I10 Essential (primary) hypertension: Secondary | ICD-10-CM

## 2023-08-23 DIAGNOSIS — Z79899 Other long term (current) drug therapy: Secondary | ICD-10-CM | POA: Diagnosis not present

## 2023-08-23 DIAGNOSIS — D6869 Other thrombophilia: Secondary | ICD-10-CM | POA: Diagnosis not present

## 2023-08-23 NOTE — Telephone Encounter (Signed)
 Called and spoke with patient's wife per DPR. Patient's wife states that the patient has been having increase in bilateral leg pain over the last 4 days. Wife states that their pharmacist told them it could be from statins. Recommended holding Rosuvastatin  to see if improvement in pain. Will forward to provider for further recommendations.

## 2023-08-23 NOTE — Telephone Encounter (Signed)
 Pt c/o medication issue:  1. Name of Medication: ezetimibe  (ZETIA ) 10 MG tablet  rosuvastatin  (CRESTOR ) 10 MG tablet  2. How are you currently taking this medication (dosage and times per day)? As directed  3. Are you having a reaction (difficulty breathing--STAT)? -  4. What is your medication issue? Pt having pain in his legs mainly knees and wanting to know if could be coming from statins. Spouse requesting cb to discuss

## 2023-08-23 NOTE — Patient Instructions (Signed)
 Medication Instructions:  No changes at this time.   *If you need a refill on your cardiac medications before your next appointment, please call your pharmacy*  Lab Work: None  If you have labs (blood work) drawn today and your tests are completely normal, you will receive your results only by: MyChart Message (if you have MyChart) OR A paper copy in the mail If you have any lab test that is abnormal or we need to change your treatment, we will call you to review the results.  Testing/Procedures: None  Follow-Up: At [facility], you and your health needs are our priority.  As part of our continuing mission to provide you with exceptional heart care, our providers are all part of one team.  This team includes your primary Cardiologist (physician) and Advanced Practice Providers or APPs (Physician Assistants and Nurse Practitioners) who all work together to provide you with the care you need, when you need it.  Your next appointment:   3 month(s)  Provider:   Suzann Riddle, NP

## 2023-08-25 NOTE — Telephone Encounter (Signed)
 Duplicate request, refilled 08/25/23.  Requested Prescriptions  Pending Prescriptions Disp Refills   DULoxetine  (CYMBALTA ) 30 MG capsule [Pharmacy Med Name: DULOXETINE  HYDROCHLORIDE DR 30MG  DR CAPSULE DR PART] 180 capsule 0    Sig: TAKE ONE CAPSULE (30 MG TOTAL) BY MOUTH DAILY FOR 30 DAYS, THEN TWO CAPSULES (60 MG TOTAL) DAILY.     There is no refill protocol information for this order

## 2023-08-25 NOTE — Telephone Encounter (Signed)
 Requested Prescriptions  Pending Prescriptions Disp Refills   DULoxetine  (CYMBALTA ) 30 MG capsule [Pharmacy Med Name: DULOXETINE  HYDROCHLORIDE DR 30MG  DR CAPSULE DR PART] 180 capsule 1    Sig: Take 1 capsule (30 mg total) by mouth 2 (two) times daily.     Psychiatry: Antidepressants - SNRI - duloxetine  Failed - 08/25/2023 11:15 AM      Failed - Cr in normal range and within 360 days    Creat  Date Value Ref Range Status  08/12/2023 1.33 (H) 0.70 - 1.30 mg/dL Final   Creatinine, Urine  Date Value Ref Range Status  08/12/2023 27 20 - 320 mg/dL Final         Passed - eGFR is 30 or above and within 360 days    GFR, Est African American  Date Value Ref Range Status  08/05/2020 79 > OR = 60 mL/min/1.58m2 Final   GFR, Est Non African American  Date Value Ref Range Status  08/05/2020 69 > OR = 60 mL/min/1.13m2 Final   GFR, Estimated  Date Value Ref Range Status  04/22/2023 >60 >60 mL/min Final    Comment:    (NOTE) Calculated using the CKD-EPI Creatinine Equation (2021)    GFR  Date Value Ref Range Status  01/23/2020 67.67 >60.00 mL/min Final    Comment:    Calculated using the CKD-EPI Creatinine Equation (2021)   eGFR  Date Value Ref Range Status  08/12/2023 62 > OR = 60 mL/min/1.59m2 Final  11/30/2022 68 >59 mL/min/1.73 Final         Passed - Completed PHQ-2 or PHQ-9 in the last 360 days      Passed - Last BP in normal range    BP Readings from Last 1 Encounters:  08/23/23 138/78         Passed - Valid encounter within last 6 months    Recent Outpatient Visits           1 week ago Encounter for general adult medical examination with abnormal findings   [facility] [place], Angeline ORN, NP       Future Appointments             In 3 months Riddle, Suzann, NP [facility]

## 2023-08-29 ENCOUNTER — Other Ambulatory Visit (HOSPITAL_COMMUNITY): Payer: Self-pay | Admitting: Internal Medicine

## 2023-08-29 NOTE — Telephone Encounter (Signed)
 Called and spoke with wife per DPR. Notified her of the following from Dr. Gollan.  Okay to hold Crestor  for 2 to 3 weeks to see if symptoms improve If symptoms do get better, we could potentially go to 10 mg every other day or reduced dose down to 5 daily Thx TGollan  Wife verbalizes understanding. Wife will call back in 2-3 weeks to update on symptoms.

## 2023-08-31 ENCOUNTER — Ambulatory Visit: Admitting: Podiatry

## 2023-09-05 ENCOUNTER — Other Ambulatory Visit: Payer: Self-pay | Admitting: Internal Medicine

## 2023-09-07 NOTE — Telephone Encounter (Signed)
 Requested Prescriptions  Pending Prescriptions Disp Refills   JARDIANCE  25 MG TABS tablet [Pharmacy Med Name: JARDIANCE  25MG  TABLET] 90 tablet 0    Sig: TAKE ONE TABLET (25 MG TOTAL) BY MOUTH DAILY BEFORE BREAKFAST.     Endocrinology:  Diabetes - SGLT2 Inhibitors Failed - 09/07/2023 10:51 AM      Failed - Cr in normal range and within 360 days    Creat  Date Value Ref Range Status  08/12/2023 1.33 (H) 0.70 - 1.30 mg/dL Final   Creatinine, Urine  Date Value Ref Range Status  08/12/2023 27 20 - 320 mg/dL Final         Passed - HBA1C is between 0 and 7.9 and within 180 days    HbA1c POC (<> result, manual entry)  Date Value Ref Range Status  12/09/2021 10.3 4.0 - 5.6 % Final   Hgb A1c MFr Bld  Date Value Ref Range Status  08/12/2023 6.6 (H) <5.7 % Final    Comment:    For someone without known diabetes, a hemoglobin A1c value of 6.5% or greater indicates that they may have  diabetes and this should be confirmed with a follow-up  test. . For someone with known diabetes, a value <7% indicates  that their diabetes is well controlled and a value  greater than or equal to 7% indicates suboptimal  control. A1c targets should be individualized based on  duration of diabetes, age, comorbid conditions, and  other considerations. . Currently, no consensus exists regarding use of hemoglobin A1c for diagnosis of diabetes for children. .          Passed - eGFR in normal range and within 360 days    GFR, Est African American  Date Value Ref Range Status  08/05/2020 79 > OR = 60 mL/min/1.45m2 Final   GFR, Est Non African American  Date Value Ref Range Status  08/05/2020 69 > OR = 60 mL/min/1.13m2 Final   GFR, Estimated  Date Value Ref Range Status  04/22/2023 >60 >60 mL/min Final    Comment:    (NOTE) Calculated using the CKD-EPI Creatinine Equation (2021)    GFR  Date Value Ref Range Status  01/23/2020 67.67 >60.00 mL/min Final    Comment:    Calculated using the CKD-EPI  Creatinine Equation (2021)   eGFR  Date Value Ref Range Status  08/12/2023 62 > OR = 60 mL/min/1.50m2 Final  11/30/2022 68 >59 mL/min/1.73 Final         Passed - Valid encounter within last 6 months    Recent Outpatient Visits           3 weeks ago Encounter for general adult medical examination with abnormal findings   [place] [facility] [place], Angeline ORN, NP       Future Appointments             In 2 months Riddle, Suzann, NP [facility]

## 2023-09-09 DIAGNOSIS — G4733 Obstructive sleep apnea (adult) (pediatric): Secondary | ICD-10-CM | POA: Diagnosis not present

## 2023-09-12 ENCOUNTER — Ambulatory Visit (INDEPENDENT_AMBULATORY_CARE_PROVIDER_SITE_OTHER): Admitting: Podiatry

## 2023-09-12 ENCOUNTER — Encounter: Payer: Self-pay | Admitting: Podiatry

## 2023-09-12 DIAGNOSIS — Z79899 Other long term (current) drug therapy: Secondary | ICD-10-CM

## 2023-09-12 DIAGNOSIS — L603 Nail dystrophy: Secondary | ICD-10-CM | POA: Diagnosis not present

## 2023-09-12 MED ORDER — TERBINAFINE HCL 250 MG PO TABS: 250.0000 mg | ORAL_TABLET | Freq: Every day | ORAL | 0 refills | Status: DC

## 2023-09-12 NOTE — Progress Notes (Signed)
 He presents today for follow-up of his first 30 days of Lamisil .  He denies fever chills nausea vomiting muscle aches pains calf pain back pain chest pain shortness of breath.  He just recently had blood work done after having started the medication.  States that he was about 2 weeks into the medication when he had the blood work done.  He says that all of his blood work was good.  Objective: Vital signs stable alert oriented x 3.  Pulses are palpable.  No change in nail plates or skin as of yet.  Assessment: Onychomycosis long-term therapy with Lamisil .  Plan: I am going to dispense another 90 days of Lamisil  I will follow-up with him in 4 months call with questions or concerns regarding medication or any other changes he may develop.

## 2023-09-15 NOTE — Addendum Note (Signed)
 Addended by: ELAYNE ROSINA BRAVO on: 09/15/2023 06:52 PM   Modules accepted: Level of Service

## 2023-09-20 ENCOUNTER — Other Ambulatory Visit: Payer: Self-pay | Admitting: Internal Medicine

## 2023-09-22 NOTE — Telephone Encounter (Signed)
 Requested Prescriptions  Pending Prescriptions Disp Refills   pantoprazole  (PROTONIX ) 40 MG tablet [Pharmacy Med Name: PANTOPRAZOLE  SODIUM 40MG  TABLET DR] 90 tablet 0    Sig: TAKE ONE TABLET BY MOUTH ONCE A DAY     Gastroenterology: Proton Pump Inhibitors Passed - 09/22/2023 11:13 AM      Passed - Valid encounter within last 12 months    Recent Outpatient Visits           1 month ago Encounter for general adult medical examination with abnormal findings   [place] [facility] [place], Angeline ORN, NP       Future Appointments             In 2 months Riddle, Suzann, NP [facility]

## 2023-09-27 ENCOUNTER — Other Ambulatory Visit: Payer: Self-pay | Admitting: Internal Medicine

## 2023-09-27 ENCOUNTER — Other Ambulatory Visit: Payer: Self-pay | Admitting: Cardiovascular Disease

## 2023-09-27 DIAGNOSIS — M791 Myalgia, unspecified site: Secondary | ICD-10-CM

## 2023-09-27 DIAGNOSIS — M2550 Pain in unspecified joint: Secondary | ICD-10-CM

## 2023-09-28 NOTE — Telephone Encounter (Signed)
 Cr. In date.     Requested Prescriptions  Pending Prescriptions Disp Refills   glipiZIDE  (GLUCOTROL ) 10 MG tablet [Pharmacy Med Name: GLIPIZIDE  10MG  TABLET] 180 tablet 1    Sig: TAKE ONE TABLET BY MOUTH TWICE A DAY BEFORE A MEAL.     Endocrinology:  Diabetes - Sulfonylureas Failed - 09/28/2023 11:17 AM      Failed - Cr in normal range and within 360 days    Creat  Date Value Ref Range Status  08/12/2023 1.33 (H) 0.70 - 1.30 mg/dL Final   Creatinine, Urine  Date Value Ref Range Status  08/12/2023 27 20 - 320 mg/dL Final         Passed - HBA1C is between 0 and 7.9 and within 180 days    HbA1c POC (<> result, manual entry)  Date Value Ref Range Status  12/09/2021 10.3 4.0 - 5.6 % Final   Hgb A1c MFr Bld  Date Value Ref Range Status  08/12/2023 6.6 (H) <5.7 % Final    Comment:    For someone without known diabetes, a hemoglobin A1c value of 6.5% or greater indicates that they may have  diabetes and this should be confirmed with a follow-up  test. . For someone with known diabetes, a value <7% indicates  that their diabetes is well controlled and a value  greater than or equal to 7% indicates suboptimal  control. A1c targets should be individualized based on  duration of diabetes, age, comorbid conditions, and  other considerations. . Currently, no consensus exists regarding use of hemoglobin A1c for diagnosis of diabetes for children. SABRA Amy - Valid encounter within last 6 months    Recent Outpatient Visits           1 month ago Encounter for general adult medical examination with abnormal findings   [place] [facility] [place], Angeline ORN, NP       Future Appointments             In 1 month Riddle, Suzann, NP [place] [facility]             celecoxib  (CELEBREX ) 100 MG capsule [Pharmacy Med Name: CELECOXIB  100MG  CAPSULE] 90 capsule 0    Sig: TAKE ONE CAPSULE (100 MG TOTAL) BY MOUTH EVERY MORNING. DOSE DECREASE      Analgesics:  COX2 Inhibitors Failed - 09/28/2023 11:17 AM      Failed - Manual Review: Labs are only required if the patient has taken medication for more than 8 weeks.      Failed - Cr in normal range and within 360 days    Creat  Date Value Ref Range Status  08/12/2023 1.33 (H) 0.70 - 1.30 mg/dL Final   Creatinine, Urine  Date Value Ref Range Status  08/12/2023 27 20 - 320 mg/dL Final         Passed - HGB in normal range and within 360 days    Hemoglobin  Date Value Ref Range Status  08/12/2023 14.4 13.2 - 17.1 g/dL Final  89/98/7975 84.9 13.0 - 17.7 g/dL Final         Passed - HCT in normal range and within 360 days    HCT  Date Value Ref Range Status  08/12/2023 45.5 38.5 - 50.0 % Final   Hematocrit  Date Value Ref Range Status  11/30/2022 47.7 37.5 - 51.0 % Final  Passed - AST in normal range and within 360 days    AST  Date Value Ref Range Status  08/12/2023 16 10 - 35 U/L Final         Passed - ALT in normal range and within 360 days    ALT  Date Value Ref Range Status  08/12/2023 13 9 - 46 U/L Final         Passed - eGFR is 30 or above and within 360 days    GFR, Est African American  Date Value Ref Range Status  08/05/2020 79 > OR = 60 mL/min/1.40m2 Final   GFR, Est Non African American  Date Value Ref Range Status  08/05/2020 69 > OR = 60 mL/min/1.96m2 Final   GFR, Estimated  Date Value Ref Range Status  04/22/2023 >60 >60 mL/min Final    Comment:    (NOTE) Calculated using the CKD-EPI Creatinine Equation (2021)    GFR  Date Value Ref Range Status  01/23/2020 67.67 >60.00 mL/min Final    Comment:    Calculated using the CKD-EPI Creatinine Equation (2021)   eGFR  Date Value Ref Range Status  08/12/2023 62 > OR = 60 mL/min/1.79m2 Final  11/30/2022 68 >59 mL/min/1.73 Final         Passed - Patient is not pregnant      Passed - Valid encounter within last 12 months    Recent Outpatient Visits           1 month ago Encounter  for general adult medical examination with abnormal findings   [place] [facility] [place], Angeline ORN, NP       Future Appointments             In 1 month Riddle, Suzann, NP [place] [facility]             buPROPion  (WELLBUTRIN  SR) 150 MG 12 hr tablet [Pharmacy Med Name: BUPROPION  HYDROCHLORIDE ER (SR) 150MG  SR TABLET ER 12HR] 180 tablet 1    Sig: TAKE ONE TABLET (150 MG TOTAL) BY MOUTH TWO (TWO) TIMES DAILY.     Psychiatry: Antidepressants - bupropion  Failed - 09/28/2023 11:17 AM      Failed - Cr in normal range and within 360 days    Creat  Date Value Ref Range Status  08/12/2023 1.33 (H) 0.70 - 1.30 mg/dL Final   Creatinine, Urine  Date Value Ref Range Status  08/12/2023 27 20 - 320 mg/dL Final         Passed - AST in normal range and within 360 days    AST  Date Value Ref Range Status  08/12/2023 16 10 - 35 U/L Final         Passed - ALT in normal range and within 360 days    ALT  Date Value Ref Range Status  08/12/2023 13 9 - 46 U/L Final         Passed - Completed PHQ-2 or PHQ-9 in the last 360 days      Passed - Last BP in normal range    BP Readings from Last 1 Encounters:  08/23/23 138/78         Passed - Valid encounter within last 6 months    Recent Outpatient Visits           1 month ago Encounter for general adult medical examination with abnormal findings   [place] [facility] [place], Angeline ORN, NP  Future Appointments             In 1 month Riddle, Suzann, NP [facility] at [facility]

## 2023-09-29 NOTE — Telephone Encounter (Signed)
 Pt last saw Chantal Needle, NP on 08/23/23, last labs 08/12/23 Creat 1.33, age 58, weight 148.5kg, based on specified criteria pt is on appropriate dosage of Eliquis  5mg  BID for afib.  Will refill rx.

## 2023-10-04 ENCOUNTER — Other Ambulatory Visit: Payer: Self-pay | Admitting: Cardiovascular Disease

## 2023-10-04 ENCOUNTER — Other Ambulatory Visit: Payer: Self-pay | Admitting: Internal Medicine

## 2023-10-05 NOTE — Telephone Encounter (Signed)
 Requested Prescriptions  Pending Prescriptions Disp Refills   lisinopril  (ZESTRIL ) 10 MG tablet [Pharmacy Med Name: LISINOPRIL  10MG  TABLET] 90 tablet 0    Sig: TAKE ONE TABLET (10 MG TOTAL) BY MOUTH DAILY.     Cardiovascular:  ACE Inhibitors Failed - 10/05/2023  4:00 PM      Failed - Cr in normal range and within 180 days    Creat  Date Value Ref Range Status  08/12/2023 1.33 (H) 0.70 - 1.30 mg/dL Final   Creatinine, Urine  Date Value Ref Range Status  08/12/2023 27 20 - 320 mg/dL Final         Passed - K in normal range and within 180 days    Potassium  Date Value Ref Range Status  08/12/2023 3.9 3.5 - 5.3 mmol/L Final         Passed - Patient is not pregnant      Passed - Last BP in normal range    BP Readings from Last 1 Encounters:  08/23/23 138/78         Passed - Valid encounter within last 6 months    Recent Outpatient Visits           1 month ago Encounter for general adult medical examination with abnormal findings   [place] [facility] [place], Angeline ORN, NP       Future Appointments             In 1 month Riddle, Suzann, NP [place] [facility] [employer]  (FLOMAX ) 0.4 MG CAPS capsule [Pharmacy Med Name: TAMSULOSIN  HYDROCHLORIDE 0.4MG  CAPSULE] 90 capsule 1    Sig: TAKE ONE CAPSULE BY MOUTH ONCE DAILY     Urology: Alpha-Adrenergic Blocker Passed - 10/05/2023  4:00 PM      Passed - PSA in normal range and within 360 days    PSA  Date Value Ref Range Status  08/12/2023 0.44 < OR = 4.00 ng/mL Final    Comment:    The total PSA value from this assay system is  standardized against the WHO standard. The test  result will be approximately 20% lower when compared  to the equimolar-standardized total PSA (Beckman  Coulter). Comparison of serial PSA results should be  interpreted with this fact in mind. . This test was performed using the Siemens  chemiluminescent method. Values obtained from  different assay  methods cannot be used interchangeably. PSA levels, regardless of value, should not be interpreted as absolute evidence of the presence or absence of disease.          Passed - Last BP in normal range    BP Readings from Last 1 Encounters:  08/23/23 138/78         Passed - Valid encounter within last 12 months    Recent Outpatient Visits           1 month ago Encounter for general adult medical examination with abnormal findings   [place] [facility] [place], Angeline ORN, NP       Future Appointments             In 1 month Riddle, Suzann, NP [facility] at [facility]

## 2023-10-10 DIAGNOSIS — G4733 Obstructive sleep apnea (adult) (pediatric): Secondary | ICD-10-CM | POA: Diagnosis not present

## 2023-10-11 ENCOUNTER — Other Ambulatory Visit: Payer: Self-pay | Admitting: Internal Medicine

## 2023-10-13 NOTE — Telephone Encounter (Signed)
 Requested Prescriptions  Pending Prescriptions Disp Refills   sitaGLIPtin  (JANUVIA ) 100 MG tablet [Pharmacy Med Name: JANUVIA  100MG  TABLET] 90 tablet 1    Sig: TAKE ONE TABLET (100 MG TOTAL) BY MOUTH DAILY.     Endocrinology:  Diabetes - DPP-4 Inhibitors Failed - 10/13/2023  4:03 PM      Failed - Cr in normal range and within 360 days    Creat  Date Value Ref Range Status  08/12/2023 1.33 (H) 0.70 - 1.30 mg/dL Final   Creatinine, Urine  Date Value Ref Range Status  08/12/2023 27 20 - 320 mg/dL Final         Passed - HBA1C is between 0 and 7.9 and within 180 days    HbA1c POC (<> result, manual entry)  Date Value Ref Range Status  12/09/2021 10.3 4.0 - 5.6 % Final   Hgb A1c MFr Bld  Date Value Ref Range Status  08/12/2023 6.6 (H) <5.7 % Final    Comment:    For someone without known diabetes, a hemoglobin A1c value of 6.5% or greater indicates that they may have  diabetes and this should be confirmed with a follow-up  test. . For someone with known diabetes, a value <7% indicates  that their diabetes is well controlled and a value  greater than or equal to 7% indicates suboptimal  control. A1c targets should be individualized based on  duration of diabetes, age, comorbid conditions, and  other considerations. . Currently, no consensus exists regarding use of hemoglobin A1c for diagnosis of diabetes for children. SABRA Amy - Valid encounter within last 6 months    Recent Outpatient Visits           2 months ago Encounter for general adult medical examination with abnormal findings   [place] [facility] [place], Angeline ORN, NP       Future Appointments             In 1 month Riddle, Suzann, NP [facility]

## 2023-11-01 ENCOUNTER — Telehealth: Payer: Self-pay | Admitting: Pharmacy Technician

## 2023-11-01 ENCOUNTER — Other Ambulatory Visit (HOSPITAL_COMMUNITY): Payer: Self-pay

## 2023-11-01 NOTE — Telephone Encounter (Signed)
 Pharmacy Patient Advocate Encounter   Received notification from CoverMyMeds that prior authorization for Trulicity  0.75mg  is due for renewal.   Insurance verification completed.   The patient is insured through [employer].  Action: PA required; PA submitted to above mentioned insurance via Latent Key/confirmation #/EOC [id number] Status is pending

## 2023-11-03 ENCOUNTER — Telehealth: Payer: Self-pay | Admitting: Pharmacy Technician

## 2023-11-03 ENCOUNTER — Other Ambulatory Visit (HOSPITAL_COMMUNITY): Payer: Self-pay

## 2023-11-03 NOTE — Telephone Encounter (Signed)
 Pharmacy Patient Advocate Encounter   Received notification from CoverMyMeds that prior authorization for Jardiance  25mg  is required/requested.   Insurance verification completed.   The patient is insured through [facility] .   Per test claim: PA required; PA started via Latent, KEY [id number] . Waiting for clinical questions to populate.

## 2023-11-03 NOTE — Telephone Encounter (Signed)
 Pharmacy Patient Advocate Encounter  Received notification from [employer] that Prior Authorization for Trulicity  0.75mg   has been APPROVED from 11/03/2023 to 11/02/2024   PA #/Case ID/Reference #: [id number]

## 2023-11-07 ENCOUNTER — Other Ambulatory Visit: Payer: Self-pay | Admitting: Internal Medicine

## 2023-11-07 ENCOUNTER — Other Ambulatory Visit (HOSPITAL_COMMUNITY): Payer: Self-pay

## 2023-11-07 NOTE — Telephone Encounter (Signed)
 Clinical questions have been answered and PA submitted. PA currently Pending.  Pharmacy Patient Advocate Encounter  Received notification from Latent that Prior Authorization for Jardiance  25mg  has been APPROVED from 11/07/23 to 11/06/24. Ran test claim, Copay is $0 for a 30 day supply. This test claim was processed through [facility]- copay amounts may vary at other pharmacies due to pharmacy/plan contracts, or as the patient moves through the different stages of their insurance plan. Approval letter and details did not show in Latent. Will upload the letter once available.   PA #/Case ID/Reference #: [id number]

## 2023-11-07 NOTE — Telephone Encounter (Signed)
 PA ref # [id number]

## 2023-11-08 DIAGNOSIS — G4733 Obstructive sleep apnea (adult) (pediatric): Secondary | ICD-10-CM | POA: Diagnosis not present

## 2023-11-08 NOTE — Telephone Encounter (Signed)
 Requested Prescriptions  Pending Prescriptions Disp Refills   Dulaglutide  (TRULICITY ) 0.75 MG/0.5ML SOAJ [Pharmacy Med Name: TRULICITY  0.75/0.5 SOLN AUTO-INJ] 6 mL 1    Sig: INJECT 0.75 MG INTO THE SKIN ONCE A WEEK.     Endocrinology:  Diabetes - GLP-1 Receptor Agonists Passed - 11/08/2023  4:26 PM      Passed - HBA1C is between 0 and 7.9 and within 180 days    HbA1c POC (<> result, manual entry)  Date Value Ref Range Status  12/09/2021 10.3 4.0 - 5.6 % Final   Hgb A1c MFr Bld  Date Value Ref Range Status  08/12/2023 6.6 (H) <5.7 % Final    Comment:    For someone without known diabetes, a hemoglobin A1c value of 6.5% or greater indicates that they may have  diabetes and this should be confirmed with a follow-up  test. . For someone with known diabetes, a value <7% indicates  that their diabetes is well controlled and a value  greater than or equal to 7% indicates suboptimal  control. A1c targets should be individualized based on  duration of diabetes, age, comorbid conditions, and  other considerations. . Currently, no consensus exists regarding use of hemoglobin A1c for diagnosis of diabetes for children. SABRA Amy - Valid encounter within last 6 months    Recent Outpatient Visits           2 months ago Encounter for general adult medical examination with abnormal findings   [place] [facility] [place], Angeline ORN, NP       Future Appointments             In 2 weeks Riddle, Suzann, NP [facility]

## 2023-11-10 DIAGNOSIS — G4733 Obstructive sleep apnea (adult) (pediatric): Secondary | ICD-10-CM | POA: Diagnosis not present

## 2023-11-12 ENCOUNTER — Other Ambulatory Visit: Payer: Self-pay | Admitting: Podiatry

## 2023-11-22 NOTE — Progress Notes (Unsigned)
 Electrophysiology Clinic Note    Date:  11/23/2023  Patient ID:  Anthony Castillo, Anthony Castillo 10/23/1965, MRN [id number] PCP:  Antonette Angeline ORN, NP  Cardiologist:  Evalene Lunger, MD   Electrophysiologist:  Fonda Kitty, MD  Electrophysiology APP:  Violia Knopf, NP    Discussed the use of AI scribe software for clinical note transcription with the patient, who gave verbal consent to proceed.   Patient Profile    Chief Complaint: tikosyn  follow-up  History of Present Illness: Anthony Castillo is a 58 y.o. 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   I last saw him 07/2023 where he was maintaining sinus rhythm.   On follow-up today, he continues to be asymptomatic of arrhythmia, no AFib that he is aware of. He continues to have fatigue and stress, but overall feels well.  He diligently takes tikosyn  BID, usually 7am and 8pm with meals. Continues to take eliquis  with superficial bleeding, no significant bleeding.   He denies chest pain, chest pressure, palpitations.    Arrhythmia/Device History Tikosyn  - loaded 04/2023    ROS:  Please see the history of present illness. All other systems are reviewed and otherwise negative.    Physical Exam    VS:  BP 138/80 (BP Location: Left Arm, Patient Position: Sitting, Cuff Size: Normal)   Pulse 75   Ht 6' (1.829 m)   Wt (!) 329 lb (149.2 kg)   SpO2 96%   BMI 44.62 kg/m  BMI: Body mass index is 44.62 kg/m.      Wt Readings from Last 3 Encounters:  11/23/23 (!) 329 lb (149.2 kg)  08/23/23 (!) 327 lb 6 oz (148.5 kg)  08/12/23 (!) 329 lb 12.8 oz (149.6 kg)     GEN- The patient is well appearing, alert and oriented x 3 today.   Lungs- Clear to ausculation bilaterally, normal work of breathing.  Heart- Regular rate and rhythm, no murmurs, rubs or gallops Extremities- No peripheral edema, warm, dry    Studies  Reviewed   Previous EP, cardiology notes.    EKG is ordered. Personal review of EKG from today shows:    EKG Interpretation Date/Time:  Wednesday November 23 2023 09:42:02 EDT Ventricular Rate:  75 PR Interval:  170 QRS Duration:  90 QT Interval:  436 QTC Calculation: 486 R Axis:   20  Text Interpretation: Normal sinus rhythm Nonspecific T wave abnormality Confirmed by Heston Widener [phone]) on 11/23/2023 9:46:20 AM    07/2023 EKG - SR at 66; QTC 472 04/2023 EKG - SR at 73, QTC 464   TTE, 11/03/2022  1. Left ventricular ejection fraction, by estimation, is 55 to 60%. The left ventricle has normal function. The left ventricle has no regional wall motion abnormalities. Left ventricular diastolic parameters are consistent with Grade I diastolic dysfunction (impaired relaxation).   2. Right ventricular systolic function is normal. The right ventricular size is normal. Tricuspid regurgitation signal is inadequate for assessing PA pressure.   3. Left atrial size was mildly dilated.   4. The mitral valve is normal in structure. No evidence of mitral valve regurgitation. No evidence of mitral stenosis.   5. The aortic valve has an indeterminant number of cusps. Aortic valve regurgitation is mild. No aortic stenosis is present.   6. There is borderline dilatation of the ascending aorta, measuring 39 mm.   7. The inferior  vena cava is normal in size with greater than 50% respiratory variability, suggesting right atrial pressure of 3 mmHg.    CT cardiac score, 04/11/2018 1. Coronary calcium  score of 103. This was 60 percentile for age and sex matched control.  2. Mildly dilated pulmonary artery measuring 32 mm suggestive of pulmonary hypertension.   Assessment and Plan     #) persis AFib #) tikosyn  monitoring Maintaining sinus rhythm QTC borderline, but stable at Update BMP, mag today   #) Hypercoag d/t persis afib CHA2DS2-VASc Score = at least 3 [CHF History: 0, HTN History: 1,  Diabetes History: 1, Stroke History: 0, Vascular Disease History: 1, Age Score: 0, Gender Score: 0].  Therefore, the patient's annual risk of stroke is 3.2 %.    Stroke ppx - 5mg  eliquis  BID, appropriately dosed No bleeding concerns         Current medicines are reviewed at length with the patient today.   The patient does not have concerns regarding his medicines.  The following changes were made today:  none  Labs/ tests ordered today include:  Orders Placed This Encounter  Procedures   Basic metabolic panel with GFR   Magnesium   EKG 12-Lead     Disposition: Follow up with Dr. Kennyth or EP APP in 4 months    Signed, Chantal Needle, NP  11/23/23  10:05 AM  Electrophysiology [facility]

## 2023-11-23 ENCOUNTER — Encounter: Payer: Self-pay | Admitting: Cardiology

## 2023-11-23 ENCOUNTER — Ambulatory Visit: Attending: Cardiology | Admitting: Cardiology

## 2023-11-23 VITALS — BP 138/80 | HR 75 | Ht 72.0 in | Wt 329.0 lb

## 2023-11-23 DIAGNOSIS — I4819 Other persistent atrial fibrillation: Secondary | ICD-10-CM

## 2023-11-23 DIAGNOSIS — Z79899 Other long term (current) drug therapy: Secondary | ICD-10-CM | POA: Diagnosis not present

## 2023-11-23 DIAGNOSIS — D6869 Other thrombophilia: Secondary | ICD-10-CM | POA: Diagnosis not present

## 2023-11-23 DIAGNOSIS — Z5181 Encounter for therapeutic drug level monitoring: Secondary | ICD-10-CM

## 2023-11-23 NOTE — Patient Instructions (Addendum)
 Medication Instructions:  Your physician recommends that you continue on your current medications as directed. Please refer to the Current Medication list given to you today.    *If you need a refill on your cardiac medications before your next appointment, please call your pharmacy*  Lab Work: Your provider would like for you to have following labs drawn today BMP, Mg.     Testing/Procedures: No test ordered today   Follow-Up: At [facility], you and your health needs are our priority.  As part of our continuing mission to provide you with exceptional heart care, our providers are all part of one team.  This team includes your primary Cardiologist (physician) and Advanced Practice Providers or APPs (Physician Assistants and Nurse Practitioners) who all work together to provide you with the care you need, when you need it.  Your next appointment:   4 month(s)  Provider:   Suzann Riddle, NP or Dr. Kennyth

## 2023-11-24 ENCOUNTER — Ambulatory Visit: Payer: Self-pay | Admitting: Cardiology

## 2023-11-24 LAB — BASIC METABOLIC PANEL WITH GFR
BUN/Creatinine Ratio: 9 — ABNORMAL LOW (ref 10–24)
BUN: 13 mg/dL (ref 8–27)
CO2: 21 mmol/L (ref 20–29)
Calcium: 9.1 mg/dL (ref 8.6–10.2)
Chloride: 100 mmol/L (ref 96–106)
Creatinine, Ser: 1.4 mg/dL — ABNORMAL HIGH (ref 0.76–1.27)
Glucose: 147 mg/dL — ABNORMAL HIGH (ref 70–99)
Potassium: 4.2 mmol/L (ref 3.5–5.2)
Sodium: 136 mmol/L (ref 134–144)
eGFR: 58 mL/min/1.73 — ABNORMAL LOW (ref >59)

## 2023-11-24 LAB — MAGNESIUM: Magnesium: 2.3 mg/dL (ref 1.6–2.3)

## 2023-11-24 NOTE — Progress Notes (Signed)
 Last read by Emonte D Sainvil at 9:19AM on 11/24/2023.

## 2023-12-06 ENCOUNTER — Other Ambulatory Visit: Payer: Self-pay | Admitting: Internal Medicine

## 2023-12-08 NOTE — Telephone Encounter (Signed)
 Requested Prescriptions  Pending Prescriptions Disp Refills   JARDIANCE  25 MG TABS tablet [Pharmacy Med Name: JARDIANCE  25MG  TABLET] 90 tablet 0    Sig: TAKE ONE TABLET (25 MG TOTAL) BY MOUTH DAILY BEFORE BREAKFAST.     Endocrinology:  Diabetes - SGLT2 Inhibitors Failed - 12/08/2023 10:38 AM      Failed - Cr in normal range and within 360 days    Creat  Date Value Ref Range Status  08/12/2023 1.33 (H) 0.70 - 1.30 mg/dL Final   Creatinine, Ser  Date Value Ref Range Status  11/23/2023 1.40 (H) 0.76 - 1.27 mg/dL Final   Creatinine, Urine  Date Value Ref Range Status  08/12/2023 27 20 - 320 mg/dL Final         Failed - eGFR in normal range and within 360 days    GFR, Est African American  Date Value Ref Range Status  08/05/2020 79 > OR = 60 mL/min/1.43m2 Final   GFR, Est Non African American  Date Value Ref Range Status  08/05/2020 69 > OR = 60 mL/min/1.50m2 Final   GFR, Estimated  Date Value Ref Range Status  04/22/2023 >60 >60 mL/min Final    Comment:    (NOTE) Calculated using the CKD-EPI Creatinine Equation (2021)    GFR  Date Value Ref Range Status  01/23/2020 67.67 >60.00 mL/min Final    Comment:    Calculated using the CKD-EPI Creatinine Equation (2021)   eGFR  Date Value Ref Range Status  11/23/2023 58 (L) >59 mL/min/1.73 Final         Passed - HBA1C is between 0 and 7.9 and within 180 days    HbA1c POC (<> result, manual entry)  Date Value Ref Range Status  12/09/2021 10.3 4.0 - 5.6 % Final   Hgb A1c MFr Bld  Date Value Ref Range Status  08/12/2023 6.6 (H) <5.7 % Final    Comment:    For someone without known diabetes, a hemoglobin A1c value of 6.5% or greater indicates that they may have  diabetes and this should be confirmed with a follow-up  test. . For someone with known diabetes, a value <7% indicates  that their diabetes is well controlled and a value  greater than or equal to 7% indicates suboptimal  control. A1c targets should be  individualized based on  duration of diabetes, age, comorbid conditions, and  other considerations. . Currently, no consensus exists regarding use of hemoglobin A1c for diagnosis of diabetes for children. SABRA Amy - Valid encounter within last 6 months    Recent Outpatient Visits           3 months ago Encounter for general adult medical examination with abnormal findings   [facility] [place], Angeline ORN, NP       Future Appointments             In 3 months Riddle, Suzann, NP [facility] at [facility]

## 2023-12-19 ENCOUNTER — Other Ambulatory Visit (HOSPITAL_COMMUNITY): Payer: Self-pay | Admitting: Internal Medicine

## 2023-12-19 ENCOUNTER — Other Ambulatory Visit (HOSPITAL_COMMUNITY): Payer: Self-pay

## 2023-12-27 ENCOUNTER — Other Ambulatory Visit: Payer: Self-pay | Admitting: Internal Medicine

## 2023-12-27 DIAGNOSIS — M2550 Pain in unspecified joint: Secondary | ICD-10-CM

## 2023-12-27 DIAGNOSIS — M791 Myalgia, unspecified site: Secondary | ICD-10-CM

## 2023-12-28 NOTE — Telephone Encounter (Signed)
 Requested Prescriptions  Pending Prescriptions Disp Refills   celecoxib  (CELEBREX ) 100 MG capsule [Pharmacy Med Name: CELECOXIB  100MG  CAPSULE] 90 capsule 0    Sig: TAKE ONE CAPSULE (100 MG TOTAL) BY MOUTH EVERY MORNING     Analgesics:  COX2 Inhibitors Failed - 12/28/2023  4:04 PM      Failed - Manual Review: Labs are only required if the patient has taken medication for more than 8 weeks.      Failed - Cr in normal range and within 360 days    Creat  Date Value Ref Range Status  08/12/2023 1.33 (H) 0.70 - 1.30 mg/dL Final   Creatinine, Ser  Date Value Ref Range Status  11/23/2023 1.40 (H) 0.76 - 1.27 mg/dL Final   Creatinine, Urine  Date Value Ref Range Status  08/12/2023 27 20 - 320 mg/dL Final         Passed - HGB in normal range and within 360 days    Hemoglobin  Date Value Ref Range Status  08/12/2023 14.4 13.2 - 17.1 g/dL Final  89/98/7975 84.9 13.0 - 17.7 g/dL Final         Passed - HCT in normal range and within 360 days    HCT  Date Value Ref Range Status  08/12/2023 45.5 38.5 - 50.0 % Final   Hematocrit  Date Value Ref Range Status  11/30/2022 47.7 37.5 - 51.0 % Final         Passed - AST in normal range and within 360 days    AST  Date Value Ref Range Status  08/12/2023 16 10 - 35 U/L Final         Passed - ALT in normal range and within 360 days    ALT  Date Value Ref Range Status  08/12/2023 13 9 - 46 U/L Final         Passed - eGFR is 30 or above and within 360 days    GFR, Est African American  Date Value Ref Range Status  08/05/2020 79 > OR = 60 mL/min/1.29m2 Final   GFR, Est Non African American  Date Value Ref Range Status  08/05/2020 69 > OR = 60 mL/min/1.11m2 Final   GFR, Estimated  Date Value Ref Range Status  04/22/2023 >60 >60 mL/min Final    Comment:    (NOTE) Calculated using the CKD-EPI Creatinine Equation (2021)    GFR  Date Value Ref Range Status  01/23/2020 67.67 >60.00 mL/min Final    Comment:    Calculated using  the CKD-EPI Creatinine Equation (2021)   eGFR  Date Value Ref Range Status  11/23/2023 58 (L) >59 mL/min/1.73 Final         Passed - Patient is not pregnant      Passed - Valid encounter within last 12 months    Recent Outpatient Visits           4 months ago Encounter for general adult medical examination with abnormal findings   [place] [facility] [place], Angeline ORN, NP       Future Appointments             In 2 months Riddle, Suzann, NP [facility] at [facility]

## 2023-12-29 ENCOUNTER — Other Ambulatory Visit (HOSPITAL_COMMUNITY): Payer: Self-pay

## 2023-12-29 ENCOUNTER — Telehealth: Payer: Self-pay | Admitting: Pharmacy Technician

## 2023-12-29 NOTE — Telephone Encounter (Signed)
 Pharmacy Patient Advocate Encounter   Received notification from Fax that prior authorization for Dofetilide  500 is required/requested.   Insurance verification completed.   The patient is insured through [employer].   Per test claim: PA required; PA submitted to above mentioned insurance via Latent Key/confirmation #/EOC [id number] Status is pending

## 2024-01-02 ENCOUNTER — Other Ambulatory Visit: Payer: Self-pay | Admitting: Internal Medicine

## 2024-01-02 ENCOUNTER — Other Ambulatory Visit: Payer: Self-pay | Admitting: Cardiovascular Disease

## 2024-01-02 ENCOUNTER — Other Ambulatory Visit (HOSPITAL_COMMUNITY): Payer: Self-pay

## 2024-01-02 NOTE — Telephone Encounter (Signed)
 Test claim goes through as 0.00 copay for 30 days- too soon to submit new PA

## 2024-01-03 NOTE — Telephone Encounter (Signed)
 Requested Prescriptions  Pending Prescriptions Disp Refills   lisinopril  (ZESTRIL ) 10 MG tablet [Pharmacy Med Name: LISINOPRIL  10MG  TABLET] 90 tablet 0    Sig: TAKE ONE TABLET (10 MG TOTAL) BY MOUTH DAILY.     Cardiovascular:  ACE Inhibitors Failed - 01/03/2024  4:30 PM      Failed - Cr in normal range and within 180 days    Creat  Date Value Ref Range Status  08/12/2023 1.33 (H) 0.70 - 1.30 mg/dL Final   Creatinine, Ser  Date Value Ref Range Status  11/23/2023 1.40 (H) 0.76 - 1.27 mg/dL Final   Creatinine, Urine  Date Value Ref Range Status  08/12/2023 27 20 - 320 mg/dL Final         Passed - K in normal range and within 180 days    Potassium  Date Value Ref Range Status  11/23/2023 4.2 3.5 - 5.2 mmol/L Final         Passed - Patient is not pregnant      Passed - Last BP in normal range    BP Readings from Last 1 Encounters:  11/23/23 138/80         Passed - Valid encounter within last 6 months    Recent Outpatient Visits           4 months ago Encounter for general adult medical examination with abnormal findings   [place] [facility] [place], Angeline ORN, NP       Future Appointments             In 2 months Riddle, Suzann, NP [facility] at [facility]

## 2024-01-11 ENCOUNTER — Ambulatory Visit: Admitting: Podiatry

## 2024-01-17 ENCOUNTER — Other Ambulatory Visit (HOSPITAL_COMMUNITY): Payer: Self-pay | Admitting: Internal Medicine

## 2024-01-18 ENCOUNTER — Ambulatory Visit: Admitting: Podiatry

## 2024-01-18 DIAGNOSIS — Z79899 Other long term (current) drug therapy: Secondary | ICD-10-CM

## 2024-01-18 DIAGNOSIS — L603 Nail dystrophy: Secondary | ICD-10-CM

## 2024-01-18 NOTE — Progress Notes (Signed)
 He presents today with for follow-up of his Lamisil  therapy states that I do not think it is working at all.  Objective: Vital signs stable alert oriented x 3.  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  Assessment: Resolving onychomycosis lesser nails.  Plan: Follow-up with us  on an as-needed basis.

## 2024-01-19 ENCOUNTER — Other Ambulatory Visit (HOSPITAL_COMMUNITY): Payer: Self-pay

## 2024-01-19 ENCOUNTER — Telehealth: Payer: Self-pay | Admitting: Pharmacy Technician

## 2024-01-19 NOTE — Telephone Encounter (Signed)
 Pharmacy Patient Advocate Encounter  Received notification from [facility] that Prior Authorization for  Januvia  100MG  tablets has been APPROVED from 01/19/24 to 01/18/25. Ran test claim, Copay is $0. This test claim was processed through [facility]- copay amounts may vary at other pharmacies due to pharmacy/plan contracts, or as the patient moves through the different stages of their insurance plan.

## 2024-01-19 NOTE — Telephone Encounter (Signed)
 Pharmacy Patient Advocate Encounter   Received notification from Onbase that prior authorization for Januvia  100MG  tablets is required/requested.   Insurance verification completed.   The patient is insured through [facility].   Per test claim: PA required; PA started via CoverMyMeds. KEY [id number] . Waiting for clinical questions to populate.

## 2024-01-19 NOTE — Telephone Encounter (Signed)
 Clinical questions and answers have been submitted.

## 2024-01-20 ENCOUNTER — Other Ambulatory Visit (HOSPITAL_COMMUNITY): Payer: Self-pay

## 2024-01-31 ENCOUNTER — Other Ambulatory Visit: Payer: Self-pay | Admitting: Cardiovascular Disease

## 2024-01-31 NOTE — Telephone Encounter (Signed)
 Please contact pt for future appointment. Pt overdue for 12 month f/u with general cardiology.

## 2024-02-01 ENCOUNTER — Encounter: Payer: Self-pay | Admitting: Cardiology

## 2024-02-01 ENCOUNTER — Ambulatory Visit: Admitting: Internal Medicine

## 2024-02-01 ENCOUNTER — Ambulatory Visit: Attending: Cardiology | Admitting: Cardiology

## 2024-02-01 VITALS — BP 150/82 | HR 75 | Ht 72.0 in | Wt 330.2 lb

## 2024-02-01 DIAGNOSIS — I4819 Other persistent atrial fibrillation: Secondary | ICD-10-CM | POA: Diagnosis not present

## 2024-02-01 DIAGNOSIS — I2510 Atherosclerotic heart disease of native coronary artery without angina pectoris: Secondary | ICD-10-CM | POA: Diagnosis not present

## 2024-02-01 DIAGNOSIS — E782 Mixed hyperlipidemia: Secondary | ICD-10-CM | POA: Diagnosis not present

## 2024-02-01 DIAGNOSIS — E118 Type 2 diabetes mellitus with unspecified complications: Secondary | ICD-10-CM

## 2024-02-01 DIAGNOSIS — Z794 Long term (current) use of insulin: Secondary | ICD-10-CM

## 2024-02-01 DIAGNOSIS — I10 Essential (primary) hypertension: Secondary | ICD-10-CM

## 2024-02-01 DIAGNOSIS — G4733 Obstructive sleep apnea (adult) (pediatric): Secondary | ICD-10-CM

## 2024-02-01 MED ORDER — METOPROLOL SUCCINATE ER 50 MG PO TB24: 50.0000 mg | ORAL_TABLET | Freq: Every day | ORAL | 3 refills | Status: AC

## 2024-02-01 MED ORDER — DILTIAZEM HCL ER COATED BEADS 180 MG PO CP24: 180.0000 mg | ORAL_CAPSULE | Freq: Every day | ORAL | 3 refills | Status: AC

## 2024-02-01 MED ORDER — ROSUVASTATIN CALCIUM 10 MG PO TABS: 5.0000 mg | ORAL_TABLET | ORAL | Status: DC

## 2024-02-01 MED ORDER — EZETIMIBE 10 MG PO TABS: 10.0000 mg | ORAL_TABLET | Freq: Every day | ORAL | 3 refills | Status: AC

## 2024-02-01 NOTE — Progress Notes (Signed)
 Cardiology Office Note   Date:  02/01/2024  ID:  Anthony Castillo, DOB [birth year], MRN [id number] PCP: Antonette Angeline ORN, NP  [facility] Providers Cardiologist:  Evalene Lunger, MD Electrophysiologist:  Fonda Kitty, MD  Electrophysiology APP:  Riddle, Suzann, NP     History of Present Illness Anthony Castillo is a 58 y.o. male with past medical history for persistent atrial fibrillation, nonobstructive coronary artery disease by CT, OSA, hypertension, type 2 diabetes, chronic HFpEF, who is here today for follow-up.   Previous calcium  score completed 04/2018 revealed calcium  score of 103 which was the 91st percentile for age and sex matched control.  He had mildly dilated pulmonary artery measuring 32 mm suggestive of pulmonary hypertension.  Echocardiogram completed 9//24 revealed an LVEF of 55 to 60%, no RWMA, G1 DD, mild aortic regurgitation and borderline dilatation of the ascending aorta measuring 39 mm.  He underwent a direct-current cardioversion by Dr. Gollan on 12/19/2022 where he was cardioverted 5 times and converted to sinus rhythm on shock #5.  There were no postprocedure complications noted.  He converted back to atrial flutter BMP prior to follow-up on 11/7.  He was evaluated on 611/19 where he remained to be in rate controlled atrial fibrillation.  He was not interested in Tikosyn  at the time but may be in the future.  Amiodarone was not a good option due to his young age.  He was continued on apixaban  5 mg twice daily.  He was to follow-up with A-fib clinic.  He was seen by A-fib clinic on 04/12/2023 for evaluation for Tikosyn  admission.  He was started on Tikosyn  500 mcg with EKG 2 hours after each dose and continued on apixaban .  If he remained in atrial fibrillation after the fourth dose a cardioversion would be pursued.  He was continued on Tikosyn  and scheduled for direct-current cardioversion on 04/14/2023.   He underwent direct-current cardioversion procedure where he  was shocked 1 time with 300 J and converted to sinus rhythm.  There were no immediate postprocedure complications noted.  He was considered stable for discharge and was discharged home on 04/15/2023.  He was last seen in clinic 11/23/2023 by S.Riddle NP of EP.  At that time he continued to maintain sinus rhythm.  Borderline QTc but stable at 480 ms.  He was sent for updated labs.  There were no medication changes that were made or further testing that was ordered.   He returns to clinic today accompanied by his wife.  He states that he has been doing well.  Denies any chest pain, shortness of breath.  States that he had placed his rosuvastatin  on hold back in the summer and has yet to resume it.  States that he has been compliant with his apixaban  with no missed doses.  Denies any bleeding with no blood noted in his urine or stool.  States that he has been compliant with his Tikosyn  and has only missed 1 dose at a time as he has been advised missing 2 doses would require reload.  Denies any hospitalizations or visits to the emergency department.  Blood pressure is slightly elevated this afternoon.  ROS: 10 point review of systems has been reviewed and considered negative the exception was been listed in the HPI  Studies Reviewed EKG Interpretation Date/Time:  Wednesday February 01 2024 14:55:51 EST Ventricular Rate:  75 PR Interval:  172 QRS Duration:  90 QT Interval:  420 QTC Calculation: 469 R Axis:   12  Text Interpretation: Normal sinus rhythm Nonspecific T wave abnormality When compared with ECG of 23-Nov-2023 09:42, No significant change since last tracing Confirmed by Gerard Frederick ([id number]) on 02/01/2024 3:03:21 PM    2d echo 11/03/2022  1. Left ventricular ejection fraction, by estimation, is 55 to 60%. The  left ventricle has normal function. The left ventricle has no regional  wall motion abnormalities. Left ventricular diastolic parameters are  consistent with Grade I diastolic   dysfunction (impaired relaxation).   2. Right ventricular systolic function is normal. The right ventricular  size is normal. Tricuspid regurgitation signal is inadequate for assessing  PA pressure.   3. Left atrial size was mildly dilated.   4. The mitral valve is normal in structure. No evidence of mitral valve  regurgitation. No evidence of mitral stenosis.   5. The aortic valve has an indeterminant number of cusps. Aortic valve  regurgitation is mild. No aortic stenosis is present.   6. There is borderline dilatation of the ascending aorta, measuring 39  mm.   7. The inferior vena cava is normal in size with greater than 50%  respiratory variability, suggesting right atrial pressure of 3 mmHg.   Calcium  Scoring 04/11/2018 IMPRESSION: 1. Coronary calcium  score of 103. This was 22 percentile for age and sex matched control.   2. Mildly dilated pulmonary artery measuring 32 mm suggestive of pulmonary hypertension.  Risk Assessment/Calculations  CHA2DS2-VASc Score = 3   This indicates a 3.2% annual risk of stroke. The patient's score is based upon: CHF History: 0 HTN History: 1 Diabetes History: 1 Stroke History: 0 Vascular Disease History: 1 Age Score: 0 Gender Score: 0    HYPERTENSION CONTROL Vitals:   02/01/24 1451 02/01/24 1513  BP: (!) 166/80 (!) 150/82    The patient's blood pressure is elevated above target today.  In order to address the patient's elevated BP: Blood pressure will be monitored at home to determine if medication changes need to be made. (Previous visits revealed blood pressures in the 130s)          Physical Exam VS:  BP (!) 150/82 (BP Location: Left Arm, Patient Position: Sitting, Cuff Size: Large)   Pulse 75   Ht 6' (1.829 m)   Wt (!) 330 lb 3.2 oz (149.8 kg)   SpO2 94%   BMI 44.78 kg/m        Wt Readings from Last 3 Encounters:  02/01/24 (!) 330 lb 3.2 oz (149.8 kg)  11/23/23 (!) 329 lb (149.2 kg)  08/23/23 (!) 327 lb 6 oz (148.5  kg)    GEN: Well nourished, well developed in no acute distress NECK: No JVD; No carotid bruits CARDIAC: RRR, no murmurs, rubs, gallops RESPIRATORY:  Clear to auscultation without rales, wheezing or rhonchi  ABDOMEN: Soft, non-tender, non-distended EXTREMITIES:  No edema; No deformity   ASSESSMENT AND PLAN Persistent atrial fibrillation where he has undergone multiple cardioversions and is currently maintaining sinus rhythm on EKG with nonspecific T waves with a rate of 75 and a QTc stable at 469.  States that he has been compliant with his apixaban  and is continued on apixaban  5 mg twice daily for CHA2DS2-VASc score of at least 3 for stroke prophylaxis as well as continued on Tikosyn  500 mg twice daily, Cardizem  180 mg daily and Toprol -XL 50 mg daily.  Ongoing management per EP.  Obstructive sleep apnea with improved compliance with new auto CPAP device.  Ongoing management per pulmonary Dr. Jude.  Coronary artery calcification with  a calcium  score of 103 in 2021.  Stable without anginal symptoms.  No ischemic changes noted on EKG.  Not on aspirin due to oral anticoagulation.  Continue on apixaban  10 mg daily and restarted on rosuvastatin  5 mg 3 days weekly.  No further indication for ischemic evaluation at this time.  Hypertension with blood pressure elevated today in 150s and 166.  Patient has been continued on his current medication regimen of diltiazem  180 mg daily, furosemide  20 mg daily, lisinopril  10 mg daily, Toprol -XL 50 mg daily.  Chart review reveals the blood pressure is better controlled at previous visits no indication to change medications at this time patient has been encouraged to continue to monitor pressures 1 to 2 hours postmedication administration at home.  Type 2 diabetes with a hemoglobin A1c of 6.6.  Ongoing management per PCP.  Mixed hyperlipidemia with LDL 36.  With continued hypertriglyceridemia.  He had stopped rosuvastatin  several months prior due to muscle aches.   He has been encouraged to restart his rosuvastatin  at half a dose which would be 5 mg and he is only to take it 3 days weekly Monday Wednesday and Friday.  Will need updated lipid and hepatic panel in 3 months.  Morbid obesity with a BMI of 44.79.  Weight loss would be beneficial.  He may also benefit from GLP-1.       Dispo: Patient to return to clinic to see MD/APP in 11 to 12 months or sooner if needed for further evaluation.  Signed, Cheryll Keisler, NP

## 2024-02-01 NOTE — Progress Notes (Deleted)
 Subjective:    Patient ID: Anthony Castillo, male    DOB: 1965/03/07, 58 y.o.   MRN: [id number]  HPI  Patient presents to clinic today for 40-month follow-up of chronic conditions.  Depression: Chronic dysthymia.  He is taking buproprion and duloxetine  as prescribed.  He is not currently seeing a therapist.  He denies anxiety, SI/HI.  DM2 with neuropathy: His last A1c was 6.6 %, 07/2023.  He is taking trulicity , jardiance , januvia  and glipizide  as prescribed.  He is no longer taking gabapentin .  He does not check his sugars.  He does not check his feet routinely.  His last eye exam was .  Flu 10/2022.  Pneumovax 03/2021.  Prevnar 03/2017.  COVID x 2.  HTN: His BP today is 132/82.  He is taking diltiazem , metoprolol , lisinopril  and furosemide  as prescribed.  ECG from 12/2022 reviewed.  A-fib: Managed with dofetilide , diltiazem , metoprolol  and eliquis .  ECG from 10/2023 reviewed.  He follows with cardiology.  BPH: He reports mainly urinary urgency, frequency and nocturia.  He is taking tamsulosin  as prescribed.  He does not follow with urology.  GERD: He is not sure what triggers this.  He denies breakthrough on pantoprazole .  There is no upper GI on file.  Hypogonadism: He is not currently taking testosterone .  He no longer follows with urology.  OA: Generalized.  He is taking celebrex  and tylenol  as prescribed. He is no longer taking gabapentin  or tizanidine .  He does not follow with orthopedics.  HLD: His last LDL was 36, triglycerides 668, 07/2023.  He denies myalgias on atorvastatin .  He is taking ezetimibe  as well.  He does not consume a low-fat diet.  OSA: He averages less than 5-6 hours of sleep per night with the use of his new CPAP.  Sleep study from 12/2022 reviewed.  Insomnia: He has difficulty staying asleep.  He is not currently taking any medications for this. There is no sleep study on file.  MAFLD: His last AST/ALT was 16/13, 07/2023.  He does not follow with GI.  Review of  Systems  Past Medical History:  Diagnosis Date   Chewing tobacco dependence    Chronic pain 04/09/2011   Coronary artery calcification seen on CT scan    a. 04/2018 Cardiac CT: Ca2+ = 103 (91st %'ile).   DEPRESSION 03/16/2007   Diastolic dysfunction    a. 10/2022 Echo: EF 55-60%, no rwma, GrI DD, nl RV size/fxn, mildly dil LA, mild AI. Asc Ao 39mm.   GERD (gastroesophageal reflux disease)    Hypogonadism male 04/09/2011   Mixed hyperlipidemia    Morbid obesity (HCC) 09/22/2006   Osteoarthritis 04/09/2011   Persistent atrial fibrillation (HCC)    a. 11/2022 s/p DCCV 150J->200J->200J->200J A/P->200J A/P--> RSR; b. CHA2DS2VASc = 2 (HTN/Cor Ca2+).   Primary hypertension    Sleep apnea    a. Not using CPAP.   Wears glasses     Current Outpatient Medications  Medication Sig Dispense Refill   Acetaminophen  (TYLENOL  ARTHRITIS PAIN PO) Take 1,300 mg by mouth at bedtime.     buPROPion  (WELLBUTRIN  SR) 150 MG 12 hr tablet TAKE ONE TABLET (150 MG TOTAL) BY MOUTH TWO (TWO) TIMES DAILY. 180 tablet 1   celecoxib  (CELEBREX ) 100 MG capsule TAKE ONE CAPSULE (100 MG TOTAL) BY MOUTH EVERY MORNING 90 capsule 0   cetirizine (ZYRTEC) 10 MG tablet Take 10 mg by mouth daily.     Cholecalciferol (VITAMIN D3) 1000 units CAPS Take 1 capsule by mouth daily.  diltiazem  (CARDIZEM  CD) 180 MG 24 hr capsule Take 1 capsule (180 mg total) by mouth daily. 30 capsule 0   diltiazem  (TIAZAC ) 180 MG 24 hr capsule Take 180 mg by mouth daily.     dofetilide  (TIKOSYN ) 500 MCG capsule TAKE 1 CAPSULE BY MOUTH TWICE DAILY 180 capsule 3   Dulaglutide  (TRULICITY ) 0.75 MG/0.5ML SOAJ INJECT 0.75 MG INTO THE SKIN ONCE A WEEK. 6 mL 1   DULoxetine  (CYMBALTA ) 30 MG capsule Take 1 capsule (30 mg total) by mouth 2 (two) times daily. 180 capsule 1   ELIQUIS  5 MG TABS tablet TAKE ONE TABLET (5 MG TOTAL) BY MOUTH TWO (TWO) TIMES DAILY. 180 tablet 3   ezetimibe  (ZETIA ) 10 MG tablet TAKE ONE TABLET (10 MG TOTAL) BY MOUTH DAILY. 90 tablet 3    furosemide  (LASIX ) 20 MG tablet TAKE ONE TABLET (20 MG TOTAL) BY MOUTH DAILY. 90 tablet 1   glipiZIDE  (GLUCOTROL ) 10 MG tablet TAKE ONE TABLET BY MOUTH TWICE A DAY BEFORE A MEAL. 180 tablet 1   JARDIANCE  25 MG TABS tablet TAKE ONE TABLET (25 MG TOTAL) BY MOUTH DAILY BEFORE BREAKFAST. 90 tablet 0   lisinopril  (ZESTRIL ) 10 MG tablet TAKE ONE TABLET (10 MG TOTAL) BY MOUTH DAILY. 90 tablet 0   metoprolol  succinate (TOPROL -XL) 50 MG 24 hr tablet Take 1 tablet (50 mg total) by mouth daily. 30 tablet 0   pantoprazole  (PROTONIX ) 40 MG tablet TAKE ONE TABLET BY MOUTH ONCE A DAY 90 tablet 1   potassium chloride  SA (KLOR-CON  M) 20 MEQ tablet TAKE ONE TABLET (20 MEQ) BY MOUTH DAILY. 90 tablet 3   rosuvastatin  (CRESTOR ) 10 MG tablet TAKE ONE TABLET (10 MG TOTAL) BY MOUTH DAILY. (Patient not taking: Reported on 11/23/2023) 90 tablet 0   sitaGLIPtin  (JANUVIA ) 100 MG tablet TAKE ONE TABLET (100 MG TOTAL) BY MOUTH DAILY. 90 tablet 1   tamsulosin  (FLOMAX ) 0.4 MG CAPS capsule TAKE ONE CAPSULE BY MOUTH ONCE DAILY 90 capsule 1   terbinafine  (LAMISIL ) 250 MG tablet Take 1 tablet (250 mg total) by mouth daily. 30 tablet 0   terbinafine  (LAMISIL ) 250 MG tablet TAKE ONE TABLET (250 MG TOTAL) BY MOUTH DAILY. 90 tablet 0   No current facility-administered medications for this visit.    Allergies  Allergen Reactions   Penicillins Other (See Comments)    REACTION: unspecified    Family History  Problem Relation Age of Onset   Lymphoma Mother    Heart disease Father    Hypertension Father    Diabetes Sister    Diabetes Maternal Grandmother    Arthritis Paternal Grandmother    Heart disease Paternal Grandfather    Migraines Daughter    Irritable bowel syndrome Daughter    Heart Problems Son        valve    Stroke Paternal Uncle    Colon cancer Neg Hx    Prostate cancer Neg Hx     Social History   Socioeconomic History   Marital status: Married    Spouse name: Not on file   Number of children: Not on  file   Years of education: Not on file   Highest education level: Not on file  Occupational History   Not on file  Tobacco Use   Smoking status: Never    Passive exposure: Never   Smokeless tobacco: Current    Types: Chew   Tobacco comments:    has been using chew 40 years  Vaping Use   Vaping  status: Never Used  Substance and Sexual Activity   Alcohol use: Yes    Comment: rare   Drug use: Never   Sexual activity: Yes  Other Topics Concern   Not on file  Social History Narrative   Not on file   Social Drivers of Health   Financial Resource Strain: Patient Declined (02/10/2023)   Overall Financial Resource Strain (CARDIA)    Difficulty of Paying Living Expenses: Patient declined  Food Insecurity: No Food Insecurity (04/18/2023)   Hunger Vital Sign    Worried About Running Out of Food in the Last Year: Never true    Ran Out of Food in the Last Year: Never true  Transportation Needs: No Transportation Needs (04/18/2023)   PRAPARE - Administrator, Civil Service (Medical): No    Lack of Transportation (Non-Medical): No  Physical Activity: Patient Declined (02/10/2023)   Exercise Vital Sign    Days of Exercise per Week: Patient declined    Minutes of Exercise per Session: Patient declined  Stress: Patient Declined (02/10/2023)   [employer] of Occupational Health - Occupational Stress Questionnaire    Feeling of Stress : Patient declined  Social Connections: Patient Declined (02/10/2023)   Social Connection and Isolation Panel    Frequency of Communication with Friends and Family: Patient declined    Frequency of Social Gatherings with Friends and Family: Patient declined    Attends Religious Services: Patient declined    Database Administrator or Organizations: Patient declined    Attends Banker Meetings: Patient declined    Marital Status: Patient declined  Intimate Partner Violence: Not At Risk (04/18/2023)   Humiliation, Afraid, Rape,  and Kick questionnaire    Fear of Current or Ex-Partner: No    Emotionally Abused: No    Physically Abused: No    Sexually Abused: No     Constitutional: Patient reports fatigue.  Denies fever, malaise, headache or abrupt weight changes.  HEENT: Denies eye pain, eye redness, ear pain, ringing in the ears, wax buildup, runny nose, nasal congestion, bloody nose, or sore throat. Respiratory: Patient reports shortness of breath.  Denies difficulty breathing, cough or sputum production.   Cardiovascular: Patient reports swelling in legs.  Denies chest pain, chest tightness, palpitations or swelling in the hands.  Gastrointestinal: Denies abdominal pain, bloating, constipation, diarrhea or blood in the stool.  GU: Patient reports urgency, frequency and nocturia.  Denies pain with urination, burning sensation, blood in urine, odor or discharge. Musculoskeletal: Patient reports chronic joint pain.  Denies decrease in range of motion, difficulty with gait, muscle pain or joint swelling.  Skin: Denies redness, rashes, lesions or ulcercations.  Neurological: Patient reports insomnia.  Denies dizziness, difficulty with memory, difficulty with speech or problems with balance and coordination.  Psych: Patient has a history of depression.  Denies anxiety, SI/HI.  No other specific complaints in a complete review of systems (except as listed in HPI above).     Objective:   Physical Exam  There were no vitals taken for this visit.   Wt Readings from Last 3 Encounters:  11/23/23 (!) 329 lb (149.2 kg)  08/23/23 (!) 327 lb 6 oz (148.5 kg)  08/12/23 (!) 329 lb 12.8 oz (149.6 kg)    General: Appears his stated age, obese, in NAD. Skin: Warm, dry and intact. No ulcerations noted. HEENT: Head: normal shape and size; Eyes: sclera white, no icterus, conjunctiva pink, PERRLA and EOMs intact;  Cardiovascular: Normal rate and rhythm. S1,S2  noted.  No murmur, rubs or gallops noted. No JVD.  No BLE edema. No  carotid bruits noted. Pulmonary/Chest: Normal effort and positive vesicular breath sounds. No respiratory distress. No wheezes, rales or ronchi noted.  Abdomen: Soft and nontender. Normal bowel sounds.  Musculoskeletal: No difficulty with gait.  Neurological: Alert and oriented.  Coordination normal.  Psychiatric: Mood and affect mildly flat. Behavior is normal. Judgment and thought content normal.    BMET    Component Value Date/Time   NA 136 11/23/2023 1004   K 4.2 11/23/2023 1004   CL 100 11/23/2023 1004   CO2 21 11/23/2023 1004   GLUCOSE 147 (H) 11/23/2023 1004   GLUCOSE 175 (H) 08/12/2023 0845   BUN 13 11/23/2023 1004   CREATININE 1.40 (H) 11/23/2023 1004   CREATININE 1.33 (H) 08/12/2023 0845   CALCIUM  9.1 11/23/2023 1004   GFRNONAA >60 04/22/2023 0909   GFRNONAA 69 08/05/2020 0955   GFRAA 79 08/05/2020 0955    Lipid Panel     Component Value Date/Time   CHOL 97 08/12/2023 0845   TRIG 331 (H) 08/12/2023 0845   HDL 26 (L) 08/12/2023 0845   CHOLHDL 3.7 08/12/2023 0845   VLDL 28.0 01/23/2020 1413   LDLCALC 36 08/12/2023 0845    CBC    Component Value Date/Time   WBC 6.1 08/12/2023 0845   RBC 5.37 08/12/2023 0845   HGB 14.4 08/12/2023 0845   HGB 15.0 11/30/2022 0951   HCT 45.5 08/12/2023 0845   HCT 47.7 11/30/2022 0951   PLT 199 08/12/2023 0845   PLT 217 11/30/2022 0951   MCV 84.7 08/12/2023 0845   MCV 84 11/30/2022 0951   MCH 26.8 (L) 08/12/2023 0845   MCHC 31.6 (L) 08/12/2023 0845   RDW 14.7 08/12/2023 0845   RDW 14.7 11/30/2022 0951   LYMPHSABS 1.3 04/15/2021 1024   EOSABS 0.1 04/15/2021 1024   BASOSABS 0.1 04/15/2021 1024    Hgb A1C Lab Results  Component Value Date   HGBA1C 6.6 (H) 08/12/2023            Assessment & Plan:     RTC in 6 months for your annual exam Angeline Laura, NP

## 2024-02-01 NOTE — Patient Instructions (Signed)
 Medication Instructions:  Your physician recommends the following medication changes.  DECREASE: Rosuvastatin  to 5 mg Monday, Wednesday, Friday   *If you need a refill on your cardiac medications before your next appointment, please call your pharmacy*  Lab Work: No labs ordered today  If you have labs (blood work) drawn today and your tests are completely normal, you will receive your results only by: MyChart Message (if you have MyChart) OR A paper copy in the mail If you have any lab test that is abnormal or we need to change your treatment, we will call you to review the results.  Testing/Procedures: No test ordered today   Follow-Up: At [facility], you and your health needs are our priority.  As part of our continuing mission to provide you with exceptional heart care, our providers are all part of one team.  This team includes your primary Cardiologist (physician) and Advanced Practice Providers or APPs (Physician Assistants and Nurse Practitioners) who all work together to provide you with the care you need, when you need it.  Your next appointment:   11 month(s)  Provider:   You may see Timothy Gollan, MD or one of the following Advanced Practice Providers on your designated Care Team:   Tylene Lunch, NP

## 2024-02-11 ENCOUNTER — Other Ambulatory Visit: Payer: Self-pay | Admitting: Internal Medicine

## 2024-02-13 DIAGNOSIS — G4733 Obstructive sleep apnea (adult) (pediatric): Secondary | ICD-10-CM | POA: Diagnosis not present

## 2024-02-14 NOTE — Telephone Encounter (Signed)
 OFFICE VISIT NEEDED FOR ADDITIONAL REFILLS   Requested Prescriptions  Pending Prescriptions Disp Refills   DULoxetine  (CYMBALTA ) 30 MG capsule [Pharmacy Med Name: DULOXETINE  HYDROCHLORIDE DR 30MG  DR CAPSULE DR PART] 180 capsule 0    Sig: TAKE 1 CAPSULE (30 MG TOTAL) BY MOUTH 2 TIMES DAILY.     Psychiatry: Antidepressants - SNRI - duloxetine  Failed - 02/14/2024 12:36 PM      Failed - Cr in normal range and within 360 days    Creat  Date Value Ref Range Status  08/12/2023 1.33 (H) 0.70 - 1.30 mg/dL Final   Creatinine, Ser  Date Value Ref Range Status  11/23/2023 1.40 (H) 0.76 - 1.27 mg/dL Final   Creatinine, Urine  Date Value Ref Range Status  08/12/2023 27 20 - 320 mg/dL Final         Failed - Last BP in normal range    BP Readings from Last 1 Encounters:  02/01/24 (!) 150/82         Failed - Valid encounter within last 6 months    Recent Outpatient Visits           6 months ago Encounter for general adult medical examination with abnormal findings   [facility] [place], Angeline ORN, NP       Future Appointments             In 1 month Riddle, Suzann, NP [facility] - eGFR is 30 or above and within 360 days    GFR, Est African American  Date Value Ref Range Status  08/05/2020 79 > OR = 60 mL/min/1.90m2 Final   GFR, Est Non African American  Date Value Ref Range Status  08/05/2020 69 > OR = 60 mL/min/1.66m2 Final   GFR, Estimated  Date Value Ref Range Status  04/22/2023 >60 >60 mL/min Final    Comment:    (NOTE) Calculated using the CKD-EPI Creatinine Equation (2021)    GFR  Date Value Ref Range Status  01/23/2020 67.67 >60.00 mL/min Final    Comment:    Calculated using the CKD-EPI Creatinine Equation (2021)   eGFR  Date Value Ref Range Status  11/23/2023 58 (L) >59 mL/min/1.73 Final         Passed - Completed PHQ-2 or PHQ-9 in the last 360 days

## 2024-03-06 ENCOUNTER — Other Ambulatory Visit: Payer: Self-pay | Admitting: Internal Medicine

## 2024-03-08 NOTE — Telephone Encounter (Signed)
 Requested Prescriptions  Pending Prescriptions Disp Refills   JARDIANCE  25 MG TABS tablet [Pharmacy Med Name: JARDIANCE  25MG  TABLET] 90 tablet 0    Sig: TAKE ONE TABLET (25 MG TOTAL) BY MOUTH DAILY BEFORE BREAKFAST.     Endocrinology:  Diabetes - SGLT2 Inhibitors Failed - 03/08/2024 11:25 AM      Failed - Cr in normal range and within 360 days    Creat  Date Value Ref Range Status  08/12/2023 1.33 (H) 0.70 - 1.30 mg/dL Final   Creatinine, Ser  Date Value Ref Range Status  11/23/2023 1.40 (H) 0.76 - 1.27 mg/dL Final   Creatinine, Urine  Date Value Ref Range Status  08/12/2023 27 20 - 320 mg/dL Final         Failed - HBA1C is between 0 and 7.9 and within 180 days    HbA1c POC (<> result, manual entry)  Date Value Ref Range Status  12/09/2021 10.3 4.0 - 5.6 % Final   Hgb A1c MFr Bld  Date Value Ref Range Status  08/12/2023 6.6 (H) <5.7 % Final    Comment:    For someone without known diabetes, a hemoglobin A1c value of 6.5% or greater indicates that they may have  diabetes and this should be confirmed with a follow-up  test. . For someone with known diabetes, a value <7% indicates  that their diabetes is well controlled and a value  greater than or equal to 7% indicates suboptimal  control. A1c targets should be individualized based on  duration of diabetes, age, comorbid conditions, and  other considerations. . Currently, no consensus exists regarding use of hemoglobin A1c for diagnosis of diabetes for children. .          Failed - eGFR in normal range and within 360 days    GFR, Est African American  Date Value Ref Range Status  08/05/2020 79 > OR = 60 mL/min/1.15m2 Final   GFR, Est Non African American  Date Value Ref Range Status  08/05/2020 69 > OR = 60 mL/min/1.57m2 Final   GFR, Estimated  Date Value Ref Range Status  04/22/2023 >60 >60 mL/min Final    Comment:    (NOTE) Calculated using the CKD-EPI Creatinine Equation (2021)    GFR  Date Value Ref  Range Status  01/23/2020 67.67 >60.00 mL/min Final    Comment:    Calculated using the CKD-EPI Creatinine Equation (2021)   eGFR  Date Value Ref Range Status  11/23/2023 58 (L) >59 mL/min/1.73 Final         Failed - Valid encounter within last 6 months    Recent Outpatient Visits           6 months ago Encounter for general adult medical examination with abnormal findings   [facility] [place], Angeline ORN, NP       Future Appointments             In 2 weeks Riddle, Suzann, NP [facility]

## 2024-03-13 ENCOUNTER — Other Ambulatory Visit: Payer: Self-pay | Admitting: Internal Medicine

## 2024-03-14 ENCOUNTER — Encounter: Payer: Self-pay | Admitting: Internal Medicine

## 2024-03-14 ENCOUNTER — Ambulatory Visit: Admitting: Internal Medicine

## 2024-03-14 VITALS — BP 160/90 | Ht 72.0 in | Wt 334.0 lb

## 2024-03-14 DIAGNOSIS — N401 Enlarged prostate with lower urinary tract symptoms: Secondary | ICD-10-CM

## 2024-03-14 DIAGNOSIS — E11620 Type 2 diabetes mellitus with diabetic dermatitis: Secondary | ICD-10-CM | POA: Diagnosis not present

## 2024-03-14 DIAGNOSIS — Z7984 Long term (current) use of oral hypoglycemic drugs: Secondary | ICD-10-CM

## 2024-03-14 DIAGNOSIS — G4733 Obstructive sleep apnea (adult) (pediatric): Secondary | ICD-10-CM | POA: Diagnosis not present

## 2024-03-14 DIAGNOSIS — M150 Primary generalized (osteo)arthritis: Secondary | ICD-10-CM | POA: Diagnosis not present

## 2024-03-14 DIAGNOSIS — Z7985 Long-term (current) use of injectable non-insulin antidiabetic drugs: Secondary | ICD-10-CM

## 2024-03-14 DIAGNOSIS — F331 Major depressive disorder, recurrent, moderate: Secondary | ICD-10-CM

## 2024-03-14 DIAGNOSIS — E785 Hyperlipidemia, unspecified: Secondary | ICD-10-CM | POA: Diagnosis not present

## 2024-03-14 DIAGNOSIS — I152 Hypertension secondary to endocrine disorders: Secondary | ICD-10-CM

## 2024-03-14 DIAGNOSIS — K219 Gastro-esophageal reflux disease without esophagitis: Secondary | ICD-10-CM

## 2024-03-14 DIAGNOSIS — G8929 Other chronic pain: Secondary | ICD-10-CM

## 2024-03-14 DIAGNOSIS — G4701 Insomnia due to medical condition: Secondary | ICD-10-CM

## 2024-03-14 DIAGNOSIS — I4819 Other persistent atrial fibrillation: Secondary | ICD-10-CM | POA: Diagnosis not present

## 2024-03-14 DIAGNOSIS — E1169 Type 2 diabetes mellitus with other specified complication: Secondary | ICD-10-CM

## 2024-03-14 DIAGNOSIS — E1159 Type 2 diabetes mellitus with other circulatory complications: Secondary | ICD-10-CM

## 2024-03-14 DIAGNOSIS — K760 Fatty (change of) liver, not elsewhere classified: Secondary | ICD-10-CM

## 2024-03-14 DIAGNOSIS — E291 Testicular hypofunction: Secondary | ICD-10-CM

## 2024-03-14 MED ORDER — TRAMADOL HCL 50 MG PO TABS: 50.0000 mg | ORAL_TABLET | Freq: Two times a day (BID) | ORAL | 0 refills | Status: DC | PRN

## 2024-03-14 NOTE — Progress Notes (Signed)
 "  Subjective:    Patient ID: Anthony Castillo, male    DOB: February 25, 1966, 59 y.o.   MRN: [id number]  HPI  Patient presents to clinic today for 58-month follow-up of chronic conditions.  Depression: Chronic dysthymia.  He is taking duloxetine  and buproprion as prescribed but is unsure if it is helpful.  He is no longer taking lamotrigine or fluoxetine.  He is not currently seeing a therapist.  He denies anxiety, SI/HI.  DM2 with neuropathy: His last A1c was 6.6 %, 07/2023.  He is taking trulicity , jardiance , januvia  and glipizide  as prescribed.  He is no longer taking gabapentin .  He does not check his sugars.  He does not check his feet routinely.  His last eye exam was 06/2022.  Flu 10/2023.  Pneumovax 03/2021.  Prevnar 03/2017.  COVID x 2.  HTN: Associated with diabetes.  His BP today is 158/84.  He is taking diltiazem , metoprolol , lisinopril  and furosemide  as prescribed.  ECG from 01/2024 reviewed.  A-fib: Status post cardioversion x 2.  Managed with diltiazem , dofetilide , metoprolol  and apixaban .  ECG from 01/2024 reviewed.  He follows with cardiology.  BPH: He reports mainly urinary urgency, frequency and nocturia.  He is taking tamsulosin  as prescribed but does not feel like it is helpful.SABRA  He does not follow with urology.  GERD: He is not sure what triggers this.  He denies breakthrough on pantoprazole .  There is no upper GI on file.  Hypogonadism: He is not currently taking testosterone .  He is unsure if the testosterone  helped him in the past.  He no longer follows with urology.  OA: Generalized but he reports persistent worsening shoulder pain.  He is taking celecoxib  with some relief of symptoms.  He is no longer taking tizanidine  or gabapentin .  He takes tylenol  arthritis as needed for breakthrough.  MRI right shoulder from 2023 reviewed.  He does not follow with orthopedics but reports he has seen to sports medicine in the past.  HLD: Associated with diabetes.  His last LDL was 36,  triglycerides 331, 07/2023.  He reports he is not taking rosuvastatin  due to joint and muscle pain but he is taking ezetimibe  as prescribed.  He does not consume a low-fat diet.  OSA: He averages less than 5-6 hours of sleep per night with the use of his CPAP.  Sleep study from 12/2022 reviewed.  Insomnia: He has difficulty staying asleep.  He takes melatonin as needed. There is no sleep study on file.  Metabolic associated fatty liver disease: His last AST/ALT was 16/13, 07/2023.  He does not follow with GI.  Review of Systems  Past Medical History:  Diagnosis Date   Chewing tobacco dependence    Chronic pain 04/09/2011   Coronary artery calcification seen on CT scan    a. 04/2018 Cardiac CT: Ca2+ = 103 (91st %'ile).   DEPRESSION 03/16/2007   Diastolic dysfunction    a. 10/2022 Echo: EF 55-60%, no rwma, GrI DD, nl RV size/fxn, mildly dil LA, mild AI. Asc Ao 39mm.   GERD (gastroesophageal reflux disease)    Hypogonadism male 04/09/2011   Mixed hyperlipidemia    Morbid obesity (HCC) 09/22/2006   Osteoarthritis 04/09/2011   Persistent atrial fibrillation (HCC)    a. 11/2022 s/p DCCV 150J->200J->200J->200J A/P->200J A/P--> RSR; b. CHA2DS2VASc = 2 (HTN/Cor Ca2+).   Primary hypertension    Sleep apnea    a. Not using CPAP.   Wears glasses     Current Outpatient Medications  Medication  Sig Dispense Refill   Acetaminophen  (TYLENOL  ARTHRITIS PAIN PO) Take 1,300 mg by mouth at bedtime.     buPROPion  (WELLBUTRIN  SR) 150 MG 12 hr tablet TAKE ONE TABLET (150 MG TOTAL) BY MOUTH TWO (TWO) TIMES DAILY. 180 tablet 1   celecoxib  (CELEBREX ) 100 MG capsule TAKE ONE CAPSULE (100 MG TOTAL) BY MOUTH EVERY MORNING 90 capsule 0   cetirizine (ZYRTEC) 10 MG tablet Take 10 mg by mouth daily.     Cholecalciferol (VITAMIN D3) 1000 units CAPS Take 1 capsule by mouth daily.     diltiazem  (CARDIZEM  CD) 180 MG 24 hr capsule Take 1 capsule (180 mg total) by mouth daily. 90 capsule 3   diltiazem  (TIAZAC ) 180 MG 24  hr capsule Take 180 mg by mouth daily.     dofetilide  (TIKOSYN ) 500 MCG capsule TAKE 1 CAPSULE BY MOUTH TWICE DAILY 180 capsule 3   Dulaglutide  (TRULICITY ) 0.75 MG/0.5ML SOAJ INJECT 0.75 MG INTO THE SKIN ONCE A WEEK. 6 mL 1   DULoxetine  (CYMBALTA ) 30 MG capsule TAKE 1 CAPSULE (30 MG TOTAL) BY MOUTH 2 TIMES DAILY. 180 capsule 0   ELIQUIS  5 MG TABS tablet TAKE ONE TABLET (5 MG TOTAL) BY MOUTH TWO (TWO) TIMES DAILY. 180 tablet 3   ezetimibe  (ZETIA ) 10 MG tablet Take 1 tablet (10 mg total) by mouth daily. 90 tablet 3   furosemide  (LASIX ) 20 MG tablet TAKE ONE TABLET (20 MG TOTAL) BY MOUTH DAILY. 90 tablet 1   glipiZIDE  (GLUCOTROL ) 10 MG tablet TAKE ONE TABLET BY MOUTH TWICE A DAY BEFORE A MEAL. 180 tablet 1   JARDIANCE  25 MG TABS tablet TAKE ONE TABLET (25 MG TOTAL) BY MOUTH DAILY BEFORE BREAKFAST. 90 tablet 0   lisinopril  (ZESTRIL ) 10 MG tablet TAKE ONE TABLET (10 MG TOTAL) BY MOUTH DAILY. 90 tablet 0   metoprolol  succinate (TOPROL -XL) 50 MG 24 hr tablet Take 1 tablet (50 mg total) by mouth daily. 90 tablet 3   pantoprazole  (PROTONIX ) 40 MG tablet TAKE ONE TABLET BY MOUTH ONCE A DAY 90 tablet 1   potassium chloride  SA (KLOR-CON  M) 20 MEQ tablet TAKE ONE TABLET (20 MEQ) BY MOUTH DAILY. 90 tablet 3   rosuvastatin  (CRESTOR ) 10 MG tablet Take 0.5 tablets (5 mg total) by mouth 3 (three) times a week.     sitaGLIPtin  (JANUVIA ) 100 MG tablet TAKE ONE TABLET (100 MG TOTAL) BY MOUTH DAILY. 90 tablet 1   tamsulosin  (FLOMAX ) 0.4 MG CAPS capsule TAKE ONE CAPSULE BY MOUTH ONCE DAILY 90 capsule 1   terbinafine  (LAMISIL ) 250 MG tablet Take 1 tablet (250 mg total) by mouth daily. 30 tablet 0   terbinafine  (LAMISIL ) 250 MG tablet TAKE ONE TABLET (250 MG TOTAL) BY MOUTH DAILY. 90 tablet 0   No current facility-administered medications for this visit.    Allergies  Allergen Reactions   Penicillins Other (See Comments)    REACTION: unspecified    Family History  Problem Relation Age of Onset   Lymphoma Mother     Heart disease Father    Hypertension Father    Diabetes Sister    Diabetes Maternal Grandmother    Arthritis Paternal Grandmother    Heart disease Paternal Grandfather    Migraines Daughter    Irritable bowel syndrome Daughter    Heart Problems Son        valve    Stroke Paternal Uncle    Colon cancer Neg Hx    Prostate cancer Neg Hx     Social  History   Socioeconomic History   Marital status: Married    Spouse name: Not on file   Number of children: Not on file   Years of education: Not on file   Highest education level: Not on file  Occupational History   Not on file  Tobacco Use   Smoking status: Never    Passive exposure: Never   Smokeless tobacco: Current    Types: Chew   Tobacco comments:    has been using chew 40 years  Vaping Use   Vaping status: Never Used  Substance and Sexual Activity   Alcohol use: Yes    Comment: rare   Drug use: Never   Sexual activity: Yes  Other Topics Concern   Not on file  Social History Narrative   Not on file   Social Drivers of Health   Tobacco Use: High Risk (02/01/2024)   Patient History    Smoking Tobacco Use: Never    Smokeless Tobacco Use: Current    Passive Exposure: Never  Financial Resource Strain: Patient Declined (02/10/2023)   Overall Financial Resource Strain (CARDIA)    Difficulty of Paying Living Expenses: Patient declined  Food Insecurity: No Food Insecurity (04/18/2023)   Hunger Vital Sign    Worried About Running Out of Food in the Last Year: Never true    Ran Out of Food in the Last Year: Never true  Transportation Needs: No Transportation Needs (04/18/2023)   PRAPARE - Administrator, Civil Service (Medical): No    Lack of Transportation (Non-Medical): No  Physical Activity: Patient Declined (02/10/2023)   Exercise Vital Sign    Days of Exercise per Week: Patient declined    Minutes of Exercise per Session: Patient declined  Stress: Patient Declined (02/10/2023)   [employer]  of Occupational Health - Occupational Stress Questionnaire    Feeling of Stress : Patient declined  Social Connections: Patient Declined (02/10/2023)   Social Connection and Isolation Panel    Frequency of Communication with Friends and Family: Patient declined    Frequency of Social Gatherings with Friends and Family: Patient declined    Attends Religious Services: Patient declined    Active Member of Clubs or Organizations: Patient declined    Attends Banker Meetings: Patient declined    Marital Status: Patient declined  Intimate Partner Violence: Not At Risk (04/18/2023)   Humiliation, Afraid, Rape, and Kick questionnaire    Fear of Current or Ex-Partner: No    Emotionally Abused: No    Physically Abused: No    Sexually Abused: No  Depression (PHQ2-9): Low Risk (08/12/2023)   Depression (PHQ2-9)    PHQ-2 Score: 0  Alcohol Screen: Low Risk (11/04/2022)   Alcohol Screen    Last Alcohol Screening Score (AUDIT): 5  Housing: Low Risk (04/18/2023)   Housing Stability Vital Sign    Unable to Pay for Housing in the Last Year: No    Number of Times Moved in the Last Year: 0    Homeless in the Last Year: No  Utilities: Not At Risk (04/18/2023)   AHC Utilities    Threatened with loss of utilities: No  Health Literacy: Patient Declined (02/10/2023)   B1300 Health Literacy    Frequency of need for help with medical instructions: Patient declines to respond     Constitutional: Patient reports fatigue.  Denies fever, malaise, headache or abrupt weight changes.  HEENT: Denies eye pain, eye redness, ear pain, ringing in the ears, wax buildup, runny  nose, nasal congestion, bloody nose, or sore throat. Respiratory: Patient reports intermittent shortness of breath.  Denies difficulty breathing, cough or sputum production.   Cardiovascular: Denies chest pain, chest tightness, palpitations or swelling in the hands or feet.  Gastrointestinal: Denies abdominal pain, bloating, constipation,  diarrhea or blood in the stool.  GU: Patient reports urgency, frequency and nocturia.  Denies pain with urination, burning sensation, blood in urine, odor or discharge. Musculoskeletal: Patient reports chronic joint pain, decrease in range of motion of shoulders.  Denies difficulty with gait, muscle pain or joint swelling.  Skin: Denies redness, rashes, lesions or ulcercations.  Neurological: Patient reports insomnia.  Denies dizziness, difficulty with memory, difficulty with speech or problems with balance and coordination.  Psych: Patient has a history of depression.  Denies anxiety, SI/HI.  No other specific complaints in a complete review of systems (except as listed in HPI above).     Objective:   Physical Exam  BP (!) 160/90   Ht 6' (1.829 m)   Wt (!) 334 lb (151.5 kg)   BMI 45.30 kg/m     Wt Readings from Last 3 Encounters:  02/01/24 (!) 330 lb 3.2 oz (149.8 kg)  11/23/23 (!) 329 lb (149.2 kg)  08/23/23 (!) 327 lb 6 oz (148.5 kg)    General: Appears his stated age, obese, in NAD. Skin: Warm, dry and intact. No ulcerations noted. HEENT: Head: normal shape and size; Eyes: sclera white, no icterus, conjunctiva pink, PERRLA and EOMs intact;  Cardiovascular: Normal rate and rhythm. S1,S2 noted.  No murmur, rubs or gallops noted. No JVD.  No BLE edema. No carotid bruits noted. Pulmonary/Chest: Normal effort and positive vesicular breath sounds. No respiratory distress. No wheezes, rales or ronchi noted.  Abdomen: Soft and nontender. Normal bowel sounds.  Musculoskeletal: Decreased internal/external rotation of the right shoulder however internal rotation is worse.  Decreased external rotation of the left shoulder, normal internal rotation.  Shoulder shrug equal.  Negative drop can test bilaterally.  Strength 5/5 BUE.  Handgrips equal.  No difficulty with gait.  Neurological: Alert and oriented.  Coordination normal.  Psychiatric: Mood and affect mildly flat. Behavior is normal.  Judgment and thought content normal.    BMET    Component Value Date/Time   NA 136 11/23/2023 1004   K 4.2 11/23/2023 1004   CL 100 11/23/2023 1004   CO2 21 11/23/2023 1004   GLUCOSE 147 (H) 11/23/2023 1004   GLUCOSE 175 (H) 08/12/2023 0845   BUN 13 11/23/2023 1004   CREATININE 1.40 (H) 11/23/2023 1004   CREATININE 1.33 (H) 08/12/2023 0845   CALCIUM  9.1 11/23/2023 1004   GFRNONAA >60 04/22/2023 0909   GFRNONAA 69 08/05/2020 0955   GFRAA 79 08/05/2020 0955    Lipid Panel     Component Value Date/Time   CHOL 97 08/12/2023 0845   TRIG 331 (H) 08/12/2023 0845   HDL 26 (L) 08/12/2023 0845   CHOLHDL 3.7 08/12/2023 0845   VLDL 28.0 01/23/2020 1413   LDLCALC 36 08/12/2023 0845    CBC    Component Value Date/Time   WBC 6.1 08/12/2023 0845   RBC 5.37 08/12/2023 0845   HGB 14.4 08/12/2023 0845   HGB 15.0 11/30/2022 0951   HCT 45.5 08/12/2023 0845   HCT 47.7 11/30/2022 0951   PLT 199 08/12/2023 0845   PLT 217 11/30/2022 0951   MCV 84.7 08/12/2023 0845   MCV 84 11/30/2022 0951   MCH 26.8 (L) 08/12/2023 0845   MCHC  31.6 (L) 08/12/2023 0845   RDW 14.7 08/12/2023 0845   RDW 14.7 11/30/2022 0951   LYMPHSABS 1.3 04/15/2021 1024   EOSABS 0.1 04/15/2021 1024   BASOSABS 0.1 04/15/2021 1024    Hgb A1C Lab Results  Component Value Date   HGBA1C 6.6 (H) 08/12/2023            Assessment & Plan:     RTC in 6 months for your annual exam Angeline Laura, NP  "

## 2024-03-14 NOTE — Telephone Encounter (Signed)
 Requested by interface surescripts. Last OV today. Future visit 08/15/24. Requested Prescriptions  Pending Prescriptions Disp Refills   pantoprazole  (PROTONIX ) 40 MG tablet [Pharmacy Med Name: PANTOPRAZOLE  SODIUM 40MG  TABLET DR] 90 tablet 1    Sig: TAKE ONE TABLET BY MOUTH ONCE A DAY     Gastroenterology: Proton Pump Inhibitors Passed - 03/14/2024  2:59 PM      Passed - Valid encounter within last 12 months    Recent Outpatient Visits           Today Type 2 diabetes mellitus with diabetic dermatitis, without long-term current use of insulin  [facility])   [facility] [place], Angeline ORN, NP   7 months ago Encounter for general adult medical examination with abnormal findings   [facility] [place], Angeline ORN, NP       Future Appointments             In 1 week Riddle, Suzann, NP [facility]

## 2024-03-15 ENCOUNTER — Encounter: Payer: Self-pay | Admitting: Internal Medicine

## 2024-03-15 ENCOUNTER — Ambulatory Visit: Payer: Self-pay | Admitting: Internal Medicine

## 2024-03-15 DIAGNOSIS — K760 Fatty (change of) liver, not elsewhere classified: Secondary | ICD-10-CM | POA: Insufficient documentation

## 2024-03-15 LAB — CBC
HCT: 47.4 % (ref 39.4–51.1)
Hemoglobin: 15 g/dL (ref 13.2–17.1)
MCH: 26 pg — ABNORMAL LOW (ref 27.0–33.0)
MCHC: 31.6 g/dL (ref 31.6–35.4)
MCV: 82 fL (ref 81.4–101.7)
MPV: 10.7 fL (ref 7.5–12.5)
Platelets: 228 Thousand/uL (ref 140–400)
RBC: 5.78 Million/uL (ref 4.20–5.80)
RDW: 14.6 % (ref 11.0–15.0)
WBC: 4.6 Thousand/uL (ref 3.8–10.8)

## 2024-03-15 LAB — COMPREHENSIVE METABOLIC PANEL WITH GFR
AG Ratio: 1.8 (calc) (ref 1.0–2.5)
ALT: 17 U/L (ref 9–46)
AST: 16 U/L (ref 10–35)
Albumin: 4.6 g/dL (ref 3.6–5.1)
Alkaline phosphatase (APISO): 81 U/L (ref 35–144)
BUN: 14 mg/dL (ref 7–25)
CO2: 28 mmol/L (ref 20–32)
Calcium: 9.2 mg/dL (ref 8.6–10.3)
Chloride: 99 mmol/L (ref 98–110)
Creat: 1.24 mg/dL (ref 0.70–1.35)
Globulin: 2.6 g/dL (ref 1.9–3.7)
Glucose, Bld: 164 mg/dL — ABNORMAL HIGH (ref 65–139)
Potassium: 4.4 mmol/L (ref 3.5–5.3)
Sodium: 136 mmol/L (ref 135–146)
Total Bilirubin: 0.5 mg/dL (ref 0.2–1.2)
Total Protein: 7.2 g/dL (ref 6.1–8.1)
eGFR: 67 mL/min/1.73m2

## 2024-03-15 LAB — LIPID PANEL
Cholesterol: 179 mg/dL
HDL: 35 mg/dL — ABNORMAL LOW
LDL Cholesterol (Calc): 99 mg/dL
Non-HDL Cholesterol (Calc): 144 mg/dL — ABNORMAL HIGH
Total CHOL/HDL Ratio: 5.1 (calc) — ABNORMAL HIGH
Triglycerides: 339 mg/dL — ABNORMAL HIGH

## 2024-03-15 LAB — HEMOGLOBIN A1C
Hgb A1c MFr Bld: 6.3 % — ABNORMAL HIGH
Mean Plasma Glucose: 134 mg/dL
eAG (mmol/L): 7.4 mmol/L

## 2024-03-15 MED ORDER — TIRZEPATIDE 5 MG/0.5ML ~~LOC~~ SOAJ: 5.0000 mg | SUBCUTANEOUS | 0 refills | Status: AC

## 2024-03-15 NOTE — Assessment & Plan Note (Signed)
 Complicated by obesity C-Met today Encourage low-fat diet and exercise for weight loss

## 2024-03-15 NOTE — Assessment & Plan Note (Signed)
 C-Met and lipid profile today Not taking rosuvastatin  5 mg 3 times weekly due to persistent joint pains Continue ezetimibe  10 mg daily Encouraged low-fat diet Would consider repatha or praluent if remains statin intolerant

## 2024-03-15 NOTE — Assessment & Plan Note (Signed)
 Continue diltiazem  180 mg daily, dofetilide  500 mg twice daily, metoprolol  50 mg daily and apixaban  5 mg twice daily He will continue to follow with cardiology

## 2024-03-15 NOTE — Assessment & Plan Note (Signed)
 Complicated by morbid obesity Continue CPAP use Encouraged weight loss as this can help reduce sleep apnea symptoms

## 2024-03-15 NOTE — Assessment & Plan Note (Addendum)
 Complicated by morbid obesity A1 C today Urine microalbumin has been checked within the last year Continue jardiance  25 mg daily, glipizide  milligrams twice daily and januvia  100 mg daily Consider changing trulicity  to mounjaro  Jerrett pending labs Reinforced low-carb diet and exercise for weight loss Encourage routine eye exam Encouraged routine foot exam Immunizations UTD

## 2024-03-15 NOTE — Patient Instructions (Signed)

## 2024-03-15 NOTE — Assessment & Plan Note (Signed)
 Encourage diet and exercise for weight loss

## 2024-03-15 NOTE — Assessment & Plan Note (Signed)
 Not currently taking testosterone  Not currently following with urology

## 2024-03-15 NOTE — Assessment & Plan Note (Signed)
 Complicated by morbid obesity Avoid foods that trigger reflux Encourage weight loss as this can help reduce reflux symptoms Continue pantoprazole  40 mg daily

## 2024-03-15 NOTE — Assessment & Plan Note (Signed)
 Continue melatonin OTC as needed Will monitor

## 2024-03-15 NOTE — Assessment & Plan Note (Signed)
 Continue duloxetine  30 mg twice daily and bupropion  150 mg twice daily Consider adding auvelity for augmentation Support offered

## 2024-03-15 NOTE — Assessment & Plan Note (Signed)
 Complicated by morbid obesity Elevated today Continue lisinopril  10 mg daily, metoprolol  50 mg daily, furosemide  20 mg daily and diltiazem  120 mg daily Reinforced DASH diet and exercise for weight loss C-Met today

## 2024-03-15 NOTE — Assessment & Plan Note (Signed)
 Complicated by morbid obesity Encourage weight loss as this can help reduce joint pain Continue celebrex  100 mg daily Referral to orthopedics placed for worsening shoulder pain

## 2024-03-15 NOTE — Assessment & Plan Note (Signed)
 He is unsure if tamsulosin  0.4 mg daily is helping, we will advise him to stop this and see if symptoms worsen Will monitor

## 2024-03-16 ENCOUNTER — Ambulatory Visit

## 2024-03-16 ENCOUNTER — Telehealth: Payer: Self-pay

## 2024-03-16 ENCOUNTER — Other Ambulatory Visit (HOSPITAL_COMMUNITY): Payer: Self-pay

## 2024-03-16 VITALS — BP 171/93 | HR 69 | Ht 72.0 in | Wt 334.2 lb

## 2024-03-16 DIAGNOSIS — M7551 Bursitis of right shoulder: Secondary | ICD-10-CM | POA: Diagnosis not present

## 2024-03-16 DIAGNOSIS — M25511 Pain in right shoulder: Secondary | ICD-10-CM

## 2024-03-16 DIAGNOSIS — M25512 Pain in left shoulder: Secondary | ICD-10-CM

## 2024-03-16 DIAGNOSIS — G8929 Other chronic pain: Secondary | ICD-10-CM

## 2024-03-16 DIAGNOSIS — M7552 Bursitis of left shoulder: Secondary | ICD-10-CM

## 2024-03-16 MED ORDER — LIDOCAINE HCL 1 % IJ SOLN
5.0000 mL | INTRAMUSCULAR | Status: AC | PRN
  Administered 2024-03-16: 5 mL

## 2024-03-16 NOTE — Progress Notes (Signed)
 "  Office Visit Note   Patient: Anthony Castillo           Date of Birth: [birth year]           MRN: [id number] Visit Date: 03/16/2024              Requested by: Antonette Angeline ORN, NP [address] [place],  [place] [address] PCP: Antonette Angeline ORN, NP   Assessment & Plan: Visit Diagnoses:  1. Bilateral shoulder bursitis   2. Chronic pain of both shoulders     Plan: Natural history and expected course discussed. Questions answered. Rest, ice, compression, and elevation (RICE) therapy. OTC analgesics as needed. Discussed subacromial steroid injection of the bilateral shoulder with patient. After discussing the risks(which include skin discoloration, infection, risk of tendon damage, and risk of hyperglycemia in diabetics) and benefits, they opted to proceed with steroid injection.  Orders:  Orders Placed This Encounter  Procedures   DG Shoulder Right   DG Shoulder Left   Large Joint Inj: bilateral subacromial bursa on 03/16/2024 10:56 AM Indications: pain Details: 22 G 1.5 in needle, posterior approach Medications (Right): 5 mL lidocaine  1 % (Betamethasone  30mg /68ml 1 ml) Medications (Left): 5 mL lidocaine  1 % (Betamethasone  30mg /101ml 1 ml) Outcome: tolerated well, no immediate complications Procedure, treatment alternatives, risks and benefits explained, specific risks discussed. Consent was given by the patient. Patient was prepped and draped in the usual sterile fashion.     Blood pressure was found to be elevated today. The patient was informed of this finding and advised to follow up with their primary care physician for ongoing monitoring and management.   Subjective: Chief Complaint: Bilateral shoulder pain  HPI Patient is a 59 y.o. male who complains of bilateral shoulder pain. Onset of the symptoms was several years ago. Mechanism of injury: none. Patient has pain when lying on the shoulders or lifting arms overhead. Symptoms have been worsening since that time. Prior history of  related problems: no prior problems with this area in the past.  Objective: Vital Signs: BP (!) 171/88   Pulse 62   Ht 6' (1.829 m)   Wt (!) 334 lb 3.2 oz (151.6 kg)   BMI 45.33 kg/m   Physical Exam Gen: Alert, No Acute Distress right shoulder: Skin intact, no erythema or induration noted. Full ROM. 5/5 abduction/IR/ER. - empty can sign. + hawkins, negative neer left shoulder: Skin intact, no erythema or induration noted. Full ROM. 5/5 abduction/IR/ER. - empty can sign. + hawkins, negative neer  Imaging: Radiographs personally reviewed by me; reveal mild AC joint arthritis of the bilateral shoulders, otherwise no abnormalities   PMFS History: Patient Active Problem List   Diagnosis Date Noted   Metabolic dysfunction-associated fatty liver disease (MAFLD) 03/15/2024   Persistent atrial fibrillation (HCC) 04/12/2023   MDD (major depressive disorder), recurrent episode, moderate (HCC) 10/18/2022   Insomnia due to medical condition 10/18/2022   Morbid obesity (HCC) 12/16/2020   Benign prostatic hyperplasia with urinary hesitancy 01/23/2020   Hyperlipidemia associated with type 2 diabetes mellitus (HCC) 08/09/2016   DM (diabetes mellitus), type 2 (HCC) 04/29/2016   GERD (gastroesophageal reflux disease) 04/29/2016   Osteoarthritis 04/09/2011   Hypogonadism male 04/09/2011   OSA (obstructive sleep apnea) 11/29/2006   Hypertension associated with diabetes (HCC) 09/22/2006   Past Medical History:  Diagnosis Date   Chewing tobacco dependence    Chronic pain 04/09/2011   Coronary artery calcification seen on CT scan    a. 04/2018 Cardiac  CT: Ca2+ = 103 (91st %'ile).   DEPRESSION 03/16/2007   Diastolic dysfunction    a. 10/2022 Echo: EF 55-60%, no rwma, GrI DD, nl RV size/fxn, mildly dil LA, mild AI. Asc Ao 39mm.   GERD (gastroesophageal reflux disease)    Hypogonadism male 04/09/2011   Mixed hyperlipidemia    Morbid obesity (HCC) 09/22/2006   Osteoarthritis 04/09/2011    Persistent atrial fibrillation (HCC)    a. 11/2022 s/p DCCV 150J->200J->200J->200J A/P->200J A/P--> RSR; b. CHA2DS2VASc = 2 (HTN/Cor Ca2+).   Primary hypertension    Sleep apnea    a. Not using CPAP.   Wears glasses     Family History  Problem Relation Age of Onset   Lymphoma Mother    Heart disease Father    Hypertension Father    Diabetes Sister    Diabetes Maternal Grandmother    Arthritis Paternal Grandmother    Heart disease Paternal Grandfather    Migraines Daughter    Irritable bowel syndrome Daughter    Heart Problems Son        valve    Stroke Paternal Uncle    Colon cancer Neg Hx    Prostate cancer Neg Hx     Past Surgical History:  Procedure Laterality Date   CARDIOVERSION N/A 12/02/2022   Procedure: CARDIOVERSION;  Surgeon: Perla Evalene PARAS, MD;  Location: [facility];  Service: Cardiovascular;  Laterality: N/A;   CARDIOVERSION N/A 04/14/2023   Procedure: CARDIOVERSION;  Surgeon: Raford Riggs, MD;  Location: [facility] INVASIVE CV LAB;  Service: Cardiovascular;  Laterality: N/A;   CARPAL TUNNEL RELEASE Right 05/17/2013   Procedure: RIGHT CARPAL TUNNEL RELEASE;  Surgeon: Lamar LULLA Leonor Mickey., MD;  Location: [facility];  Service: Orthopedics;  Laterality: Right;   CARPAL TUNNEL RELEASE Left 07/19/2013   Procedure: LEFT CARPAL TUNNEL RELEASE;  Surgeon: Lamar LULLA Leonor Mickey., MD;  Location: [facility];  Service: Orthopedics;  Laterality: Left;   FOOT SURGERY Right 01/2015   KNEE ARTHROSCOPY  6/14   right   TONSILLECTOMY     ULNAR NERVE TRANSPOSITION Right 05/17/2013   Procedure: RIGHT ULNAR NERVE DECOMPRESSION;  Surgeon: Lamar LULLA Leonor Mickey., MD;  Location: [facility];  Service: Orthopedics;  Laterality: Right;   WISDOM TOOTH EXTRACTION     Social History   Occupational History   Not on file  Tobacco Use   Smoking status: Never    Passive exposure: Never   Smokeless tobacco: Current    Types: Chew   Tobacco comments:    has  been using chew 40 years  Vaping Use   Vaping status: Never Used  Substance and Sexual Activity   Alcohol use: Yes    Comment: rare   Drug use: Never   Sexual activity: Yes   Current Outpatient Medications  Medication Instructions   Acetaminophen  (TYLENOL  ARTHRITIS PAIN PO) 1,300 mg, Nightly   buPROPion  (WELLBUTRIN  SR) 150 MG 12 hr tablet TAKE ONE TABLET (150 MG TOTAL) BY MOUTH TWO (TWO) TIMES DAILY.   celecoxib  (CELEBREX ) 100 MG capsule TAKE ONE CAPSULE (100 MG TOTAL) BY MOUTH EVERY MORNING   cetirizine (ZYRTEC) 10 mg, Daily   Cholecalciferol (VITAMIN D3) 1000 units CAPS 1 capsule, Daily   diltiazem  (CARDIZEM  CD) 180 mg, Oral, Daily   diltiazem  (TIAZAC ) 180 mg, Daily   dofetilide  (TIKOSYN ) 500 mcg, Oral, 2 times daily   DULoxetine  (CYMBALTA ) 30 MG capsule TAKE 1 CAPSULE (30 MG TOTAL) BY MOUTH 2 TIMES DAILY.   ELIQUIS   5 MG TABS tablet TAKE ONE TABLET (5 MG TOTAL) BY MOUTH TWO (TWO) TIMES DAILY.   ezetimibe  (ZETIA ) 10 mg, Oral, Daily   furosemide  (LASIX ) 20 MG tablet TAKE ONE TABLET (20 MG TOTAL) BY MOUTH DAILY.   glipiZIDE  (GLUCOTROL ) 10 MG tablet TAKE ONE TABLET BY MOUTH TWICE A DAY BEFORE A MEAL.   JARDIANCE  25 MG TABS tablet TAKE ONE TABLET (25 MG TOTAL) BY MOUTH DAILY BEFORE BREAKFAST.   lisinopril  (ZESTRIL ) 10 MG tablet TAKE ONE TABLET (10 MG TOTAL) BY MOUTH DAILY.   metoprolol  succinate (TOPROL -XL) 50 mg, Oral, Daily   pantoprazole  (PROTONIX ) 40 mg, Oral, Daily   potassium chloride  SA (KLOR-CON  M) 20 MEQ tablet TAKE ONE TABLET (20 MEQ) BY MOUTH DAILY.   rosuvastatin  (CRESTOR ) 5 mg, Oral, 3 times weekly   sitaGLIPtin  (JANUVIA ) 100 MG tablet TAKE ONE TABLET (100 MG TOTAL) BY MOUTH DAILY.   tamsulosin  (FLOMAX ) 0.4 mg, Oral, Daily   tirzepatide  (MOUNJARO ) 5 mg, Subcutaneous, Weekly   traMADol  (ULTRAM ) 50 mg, Oral, 2 times daily PRN   Allergies as of 03/16/2024 - Review Complete 03/15/2024  Allergen Reaction Noted   Penicillins Other (See Comments) 04/01/2006   "

## 2024-03-16 NOTE — Telephone Encounter (Signed)
 Pharmacy Patient Advocate Encounter   Received notification from [facility] KEY that prior authorization for Mounjaro  5 is required/requested.   Insurance verification completed.   The patient is insured through [employer].   Per test claim: PA required; PA submitted to above mentioned insurance via Latent Key/confirmation #/EOC [id number] Status is pending

## 2024-03-19 ENCOUNTER — Other Ambulatory Visit (HOSPITAL_COMMUNITY): Payer: Self-pay

## 2024-03-19 NOTE — Telephone Encounter (Signed)
 Dear Zachary Sorrel,   Our office is unable to locate your prescription insurance information needed to submit a prior authorization. To avoid any delays, please click reply, then attach, and select clear photos of the front and back of your current prescription insurance card.   Thank you,   Med Access Team

## 2024-03-20 ENCOUNTER — Other Ambulatory Visit: Payer: Self-pay | Admitting: Internal Medicine

## 2024-03-20 NOTE — Telephone Encounter (Signed)
 Requested Prescriptions  Pending Prescriptions Disp Refills   glipiZIDE  (GLUCOTROL ) 10 MG tablet [Pharmacy Med Name: GLIPIZIDE  10MG  TABLET] 180 tablet 1    Sig: TAKE ONE TABLET BY MOUTH TWICE A DAY BEFORE A MEAL.     Endocrinology:  Diabetes - Sulfonylureas Passed - 03/20/2024  4:47 PM      Passed - HBA1C is between 0 and 7.9 and within 180 days    HbA1c POC (<> result, manual entry)  Date Value Ref Range Status  12/09/2021 10.3 4.0 - 5.6 % Final   Hgb A1c MFr Bld  Date Value Ref Range Status  03/14/2024 6.3 (H) <5.7 % Final    Comment:    For someone without known diabetes, a hemoglobin  A1c value between 5.7% and 6.4% is consistent with prediabetes and should be confirmed with a  follow-up test. . For someone with known diabetes, a value <7% indicates that their diabetes is well controlled. A1c targets should be individualized based on duration of diabetes, age, comorbid conditions, and other considerations. . This assay result is consistent with an increased risk of diabetes. . Currently, no consensus exists regarding use of hemoglobin A1c for diagnosis of diabetes for children. .          Passed - Cr in normal range and within 360 days    Creat  Date Value Ref Range Status  03/14/2024 1.24 0.70 - 1.35 mg/dL Final   Creatinine, Urine  Date Value Ref Range Status  08/12/2023 27 20 - 320 mg/dL Final         Passed - Valid encounter within last 6 months    Recent Outpatient Visits           6 days ago Type 2 diabetes mellitus with diabetic dermatitis, without long-term current use of insulin  [facility])   [place] [facility] [place], Angeline ORN, NP   7 months ago Encounter for general adult medical examination with abnormal findings   [place] [facility] [place], Angeline ORN, NP       Future Appointments             In 6 days Riddle, Suzann, NP [facility]

## 2024-03-21 ENCOUNTER — Other Ambulatory Visit (HOSPITAL_COMMUNITY): Payer: Self-pay

## 2024-03-26 ENCOUNTER — Other Ambulatory Visit (HOSPITAL_COMMUNITY): Payer: Self-pay

## 2024-03-26 ENCOUNTER — Ambulatory Visit: Admitting: Cardiology

## 2024-03-26 ENCOUNTER — Other Ambulatory Visit: Payer: Self-pay | Admitting: Internal Medicine

## 2024-03-27 NOTE — Telephone Encounter (Signed)
 Requested Prescriptions  Pending Prescriptions Disp Refills   buPROPion  (WELLBUTRIN  SR) 150 MG 12 hr tablet [Pharmacy Med Name: BUPROPION  HYDROCHLORIDE ER (SR) 150MG  SR TABLET ER 12HR] 180 tablet 1    Sig: TAKE ONE TABLET (150 MG TOTAL) BY MOUTH TWO (TWO) TIMES DAILY.     Psychiatry: Antidepressants - bupropion  Failed - 03/27/2024  2:17 PM      Failed - Last BP in normal range    BP Readings from Last 1 Encounters:  03/16/24 (!) 171/93         Passed - Cr in normal range and within 360 days    Creat  Date Value Ref Range Status  03/14/2024 1.24 0.70 - 1.35 mg/dL Final   Creatinine, Urine  Date Value Ref Range Status  08/12/2023 27 20 - 320 mg/dL Final         Passed - AST in normal range and within 360 days    AST  Date Value Ref Range Status  03/14/2024 16 10 - 35 U/L Final         Passed - ALT in normal range and within 360 days    ALT  Date Value Ref Range Status  03/14/2024 17 9 - 46 U/L Final         Passed - Completed PHQ-2 or PHQ-9 in the last 360 days      Passed - Valid encounter within last 6 months    Recent Outpatient Visits           1 week ago Type 2 diabetes mellitus with diabetic dermatitis, without long-term current use of insulin  [facility])   [place] [facility] [place], Angeline ORN, NP   7 months ago Encounter for general adult medical examination with abnormal findings   [facility] [place], Angeline ORN, NP       Future Appointments             Tomorrow Riddle, Suzann, NP [facility]

## 2024-03-27 NOTE — Progress Notes (Unsigned)
 "     Electrophysiology Clinic Note    Date:  03/28/2024  Patient ID:  Anthony Castillo, Anthony Castillo 04/17/1965, MRN [id number] PCP:  Antonette Angeline ORN, NP  Cardiologist:  Evalene Lunger, MD   Electrophysiologist:  Fonda Kitty, MD  Electrophysiology APP:  Trace Cederberg, NP    Discussed the use of AI scribe software for clinical note transcription with the patient, who gave verbal consent to proceed.   Patient Profile    Chief Complaint: tikosyn  follow-up  History of Present Illness: Anthony Castillo is a 59 y.o. male with PMH notable for persis afib, non-obs CAD by imaging, HTN, OSA, T2DM, HFpEF ; seen today for Fonda Kitty, MD for routine electrophysiology followup.   I last saw him 10/2023 where he was maintaining sinus rhythm on tikosyn .   Today, he has no acute cardiac complaints. He is not aware of any AF episodes, historically asymptomatic. He saw PCP earlier today, who recommended he start repatha  for lipids - had muscle aches with statins. PCP also recommended prednisone  for joint pain. Patient and wife question whether either drug will interact with tikosyn .   He continues to take tikosyn  q12h, sometimes takes AM dose late d/t rarely sleeping in.  Continues to take eliquis  with tikosyn , no bleeding concerns.      Arrhythmia/Device History Tikosyn  - loaded 04/2023    ROS:  Please see the history of present illness. All other systems are reviewed and otherwise negative.    Physical Exam    VS:  BP (!) 150/72 (BP Location: Left Arm, Patient Position: Sitting, Cuff Size: Normal)   Pulse 83   Ht 6' 1 (1.854 m)   Wt (!) 335 lb (152 kg)   SpO2 97%   BMI 44.20 kg/m  BMI: Body mass index is 44.2 kg/m.      Wt Readings from Last 3 Encounters:  03/28/24 (!) 335 lb (152 kg)  03/28/24 (!) 334 lb (151.5 kg)  03/16/24 (!) 334 lb 3.2 oz (151.6 kg)     GEN- The patient is well appearing, alert and oriented x 3 today.   Lungs- Clear to ausculation bilaterally, normal work of  breathing.  Heart- Regular rate and rhythm, no murmurs, rubs or gallops Extremities- No peripheral edema, warm, dry    Studies Reviewed   Previous EP, cardiology notes.    EKG is ordered. Personal review of EKG from today shows:    EKG Interpretation Date/Time:  Wednesday March 28 2024 14:03:40 EST Ventricular Rate:  83 PR Interval:  152 QRS Duration:  84 QT Interval:  390 QTC Calculation: 459 R Axis:   32  Text Interpretation: Normal sinus rhythm Normal ECG Confirmed by [name] [phone]) on 03/28/2024 2:07:31 PM    10/2023 EKG - SR at 75; QTC 486 07/2023 EKG - SR at 66; QTC 472 04/2023 EKG - SR at 73, QTC 464   TTE, 11/03/2022  1. Left ventricular ejection fraction, by estimation, is 55 to 60%. The left ventricle has normal function. The left ventricle has no regional wall motion abnormalities. Left ventricular diastolic parameters are consistent with Grade I diastolic dysfunction (impaired relaxation).   2. Right ventricular systolic function is normal. The right ventricular size is normal. Tricuspid regurgitation signal is inadequate for assessing PA pressure.   3. Left atrial size was mildly dilated.   4. The mitral valve is normal in structure. No evidence of mitral valve regurgitation. No evidence of mitral stenosis.   5. The aortic valve has an  indeterminant number of cusps. Aortic valve regurgitation is mild. No aortic stenosis is present.   6. There is borderline dilatation of the ascending aorta, measuring 39 mm.   7. The inferior vena cava is normal in size with greater than 50% respiratory variability, suggesting right atrial pressure of 3 mmHg.    CT cardiac score, 04/11/2018 1. Coronary calcium  score of 103. This was 51 percentile for age and sex matched control.  2. Mildly dilated pulmonary artery measuring 32 mm suggestive of pulmonary hypertension.   Assessment and Plan     #) persis AFib #) tikosyn  monitoring Maintaining sinus rhythm EKG with stable  QTC at Recent BMP with stable Cr, K OK to take repatha  and/or prednisone  with tikosyn    #) Hypercoag d/t persis afib CHA2DS2-VASc Score = at least 3 [CHF History: 0, HTN History: 1, Diabetes History: 1, Stroke History: 0, Vascular Disease History: 1, Age Score: 0, Gender Score: 0].  Therefore, the patient's annual risk of stroke is 3.2 %.    Stroke ppx - 5mg  eliquis  BID, appropriately dosed No bleeding concerns Recent CBC stable        Current medicines are reviewed at length with the patient today.   The patient does not have concerns regarding his medicines.  The following changes were made today:  none  Labs/ tests ordered today include:  Orders Placed This Encounter  Procedures   EKG 12-Lead     Disposition: Follow up with Dr. Kennyth or EP APP in 4 months    Signed, Redford Behrle, NP  03/28/24  3:07 PM  Electrophysiology [facility] "

## 2024-03-28 ENCOUNTER — Ambulatory Visit: Admitting: Internal Medicine

## 2024-03-28 ENCOUNTER — Ambulatory Visit: Attending: Cardiology | Admitting: Cardiology

## 2024-03-28 ENCOUNTER — Encounter: Payer: Self-pay | Admitting: Internal Medicine

## 2024-03-28 ENCOUNTER — Telehealth: Payer: Self-pay

## 2024-03-28 ENCOUNTER — Encounter: Payer: Self-pay | Admitting: Cardiology

## 2024-03-28 VITALS — BP 148/84 | Ht 72.0 in | Wt 334.0 lb

## 2024-03-28 VITALS — BP 150/72 | HR 83 | Ht 73.0 in | Wt 335.0 lb

## 2024-03-28 DIAGNOSIS — M25512 Pain in left shoulder: Secondary | ICD-10-CM

## 2024-03-28 DIAGNOSIS — K5903 Drug induced constipation: Secondary | ICD-10-CM

## 2024-03-28 DIAGNOSIS — E785 Hyperlipidemia, unspecified: Secondary | ICD-10-CM

## 2024-03-28 DIAGNOSIS — D6869 Other thrombophilia: Secondary | ICD-10-CM

## 2024-03-28 DIAGNOSIS — Z7984 Long term (current) use of oral hypoglycemic drugs: Secondary | ICD-10-CM

## 2024-03-28 DIAGNOSIS — I4819 Other persistent atrial fibrillation: Secondary | ICD-10-CM

## 2024-03-28 DIAGNOSIS — E1169 Type 2 diabetes mellitus with other specified complication: Secondary | ICD-10-CM

## 2024-03-28 DIAGNOSIS — E1165 Type 2 diabetes mellitus with hyperglycemia: Secondary | ICD-10-CM | POA: Diagnosis not present

## 2024-03-28 DIAGNOSIS — Z7985 Long-term (current) use of injectable non-insulin antidiabetic drugs: Secondary | ICD-10-CM | POA: Diagnosis not present

## 2024-03-28 DIAGNOSIS — Z79899 Other long term (current) drug therapy: Secondary | ICD-10-CM

## 2024-03-28 DIAGNOSIS — Z5181 Encounter for therapeutic drug level monitoring: Secondary | ICD-10-CM

## 2024-03-28 DIAGNOSIS — M25511 Pain in right shoulder: Secondary | ICD-10-CM

## 2024-03-28 DIAGNOSIS — G8929 Other chronic pain: Secondary | ICD-10-CM | POA: Diagnosis not present

## 2024-03-28 NOTE — Progress Notes (Signed)
 "  Subjective:    Patient ID: Anthony Castillo, male    DOB: 1965-04-22, 59 y.o.   MRN: [id number]  HPI  Discussed the use of AI scribe software for clinical note transcription with the patient, who gave verbal consent to proceed.   Anthony Castillo is a 59 year old male with diabetes who presents with constipation and shoulder pain.  He experiences severe constipation, which he attributes to tramadol  use. He takes tramadol  once at night for joint pain. Despite using stool softeners and laxatives, he still feels constipated. A recent liquid laxative resulted in a bowel movement, but he continues to experience constipation. He also experiences nausea, especially after eating, which he associates with Trulicity  medications he uses for diabetes management. These medications make him feel sick and suppress his appetite.  He has a history of shoulder pain, which was temporarily relieved by a corticosteroid injection, but the pain returned after a week. He was diagnosed with a rotator cuff tear and tendon inflammation in 2022. He performs exercises at home due to lack of time for physical therapy. X-rays of the shoulders were normal, but he continues to experience significant pain. He takes celecoxib  in addition to the tramadol .  He is currently without Trulicity  due to insurance issues and is awaiting approval for Mounjaro . He is unsure if his insurance covers Januvia , as there have been issues with prescription refills.    Review of Systems  Past Medical History:  Diagnosis Date   Chewing tobacco dependence    Chronic pain 04/09/2011   Coronary artery calcification seen on CT scan    a. 04/2018 Cardiac CT: Ca2+ = 103 (91st %'ile).   DEPRESSION 03/16/2007   Diastolic dysfunction    a. 10/2022 Echo: EF 55-60%, no rwma, GrI DD, nl RV size/fxn, mildly dil LA, mild AI. Asc Ao 39mm.   GERD (gastroesophageal reflux disease)    Hypogonadism male 04/09/2011   Mixed hyperlipidemia    Morbid obesity  (HCC) 09/22/2006   Osteoarthritis 04/09/2011   Persistent atrial fibrillation (HCC)    a. 11/2022 s/p DCCV 150J->200J->200J->200J A/P->200J A/P--> RSR; b. CHA2DS2VASc = 2 (HTN/Cor Ca2+).   Primary hypertension    Sleep apnea    a. Not using CPAP.   Wears glasses     Current Outpatient Medications  Medication Sig Dispense Refill   Acetaminophen  (TYLENOL  ARTHRITIS PAIN PO) Take 1,300 mg by mouth at bedtime.     buPROPion  (WELLBUTRIN  SR) 150 MG 12 hr tablet TAKE ONE TABLET (150 MG TOTAL) BY MOUTH TWO (TWO) TIMES DAILY. 180 tablet 1   celecoxib  (CELEBREX ) 100 MG capsule TAKE ONE CAPSULE (100 MG TOTAL) BY MOUTH EVERY MORNING 90 capsule 0   cetirizine (ZYRTEC) 10 MG tablet Take 10 mg by mouth daily.     Cholecalciferol (VITAMIN D3) 1000 units CAPS Take 1 capsule by mouth daily.     diltiazem  (CARDIZEM  CD) 180 MG 24 hr capsule Take 1 capsule (180 mg total) by mouth daily. 90 capsule 3   diltiazem  (TIAZAC ) 180 MG 24 hr capsule Take 180 mg by mouth daily.     dofetilide  (TIKOSYN ) 500 MCG capsule TAKE 1 CAPSULE BY MOUTH TWICE DAILY 180 capsule 3   DULoxetine  (CYMBALTA ) 30 MG capsule TAKE 1 CAPSULE (30 MG TOTAL) BY MOUTH 2 TIMES DAILY. 180 capsule 0   ELIQUIS  5 MG TABS tablet TAKE ONE TABLET (5 MG TOTAL) BY MOUTH TWO (TWO) TIMES DAILY. 180 tablet 3   ezetimibe  (ZETIA ) 10 MG tablet Take 1  tablet (10 mg total) by mouth daily. 90 tablet 3   furosemide  (LASIX ) 20 MG tablet TAKE ONE TABLET (20 MG TOTAL) BY MOUTH DAILY. 90 tablet 1   glipiZIDE  (GLUCOTROL ) 10 MG tablet TAKE ONE TABLET BY MOUTH TWICE A DAY BEFORE A MEAL. 180 tablet 1   JARDIANCE  25 MG TABS tablet TAKE ONE TABLET (25 MG TOTAL) BY MOUTH DAILY BEFORE BREAKFAST. 90 tablet 0   lisinopril  (ZESTRIL ) 10 MG tablet TAKE ONE TABLET (10 MG TOTAL) BY MOUTH DAILY. 90 tablet 0   metoprolol  succinate (TOPROL -XL) 50 MG 24 hr tablet Take 1 tablet (50 mg total) by mouth daily. 90 tablet 3   pantoprazole  (PROTONIX ) 40 MG tablet TAKE ONE TABLET BY MOUTH ONCE A  DAY 90 tablet 1   potassium chloride  SA (KLOR-CON  M) 20 MEQ tablet TAKE ONE TABLET (20 MEQ) BY MOUTH DAILY. 90 tablet 3   rosuvastatin  (CRESTOR ) 10 MG tablet Take 0.5 tablets (5 mg total) by mouth 3 (three) times a week.     sitaGLIPtin  (JANUVIA ) 100 MG tablet TAKE ONE TABLET (100 MG TOTAL) BY MOUTH DAILY. 90 tablet 1   tamsulosin  (FLOMAX ) 0.4 MG CAPS capsule TAKE ONE CAPSULE BY MOUTH ONCE DAILY 90 capsule 1   tirzepatide  (MOUNJARO ) 5 MG/0.5ML Pen Inject 5 mg into the skin once a week. 2 mL 0   traMADol  (ULTRAM ) 50 MG tablet Take 1 tablet (50 mg total) by mouth 2 (two) times daily as needed. 60 tablet 0   No current facility-administered medications for this visit.    Allergies  Allergen Reactions   Penicillins Other (See Comments)    REACTION: unspecified    Family History  Problem Relation Age of Onset   Lymphoma Mother    Heart disease Father    Hypertension Father    Diabetes Sister    Diabetes Maternal Grandmother    Arthritis Paternal Grandmother    Heart disease Paternal Grandfather    Migraines Daughter    Irritable bowel syndrome Daughter    Heart Problems Son        valve    Stroke Paternal Uncle    Colon cancer Neg Hx    Prostate cancer Neg Hx     Social History   Socioeconomic History   Marital status: Married    Spouse name: Not on file   Number of children: Not on file   Years of education: Not on file   Highest education level: Not on file  Occupational History   Not on file  Tobacco Use   Smoking status: Never    Passive exposure: Never   Smokeless tobacco: Current    Types: Chew   Tobacco comments:    has been using chew 40 years  Vaping Use   Vaping status: Never Used  Substance and Sexual Activity   Alcohol use: Yes    Comment: rare   Drug use: Never   Sexual activity: Yes  Other Topics Concern   Not on file  Social History Narrative   Not on file   Social Drivers of Health   Tobacco Use: High Risk (03/15/2024)   Patient History     Smoking Tobacco Use: Never    Smokeless Tobacco Use: Current    Passive Exposure: Never  Financial Resource Strain: Patient Declined (02/10/2023)   Overall Financial Resource Strain (CARDIA)    Difficulty of Paying Living Expenses: Patient declined  Food Insecurity: No Food Insecurity (04/18/2023)   Hunger Vital Sign    Worried About  Running Out of Food in the Last Year: Never true    Ran Out of Food in the Last Year: Never true  Transportation Needs: No Transportation Needs (04/18/2023)   PRAPARE - Administrator, Civil Service (Medical): No    Lack of Transportation (Non-Medical): No  Physical Activity: Patient Declined (02/10/2023)   Exercise Vital Sign    Days of Exercise per Week: Patient declined    Minutes of Exercise per Session: Patient declined  Stress: Patient Declined (02/10/2023)   [employer] of Occupational Health - Occupational Stress Questionnaire    Feeling of Stress : Patient declined  Social Connections: Patient Declined (02/10/2023)   Social Connection and Isolation Panel    Frequency of Communication with Friends and Family: Patient declined    Frequency of Social Gatherings with Friends and Family: Patient declined    Attends Religious Services: Patient declined    Active Member of Clubs or Organizations: Patient declined    Attends Banker Meetings: Patient declined    Marital Status: Patient declined  Intimate Partner Violence: Not At Risk (04/18/2023)   Humiliation, Afraid, Rape, and Kick questionnaire    Fear of Current or Ex-Partner: No    Emotionally Abused: No    Physically Abused: No    Sexually Abused: No  Depression (PHQ2-9): Low Risk (03/14/2024)   Depression (PHQ2-9)    PHQ-2 Score: 0  Alcohol Screen: Low Risk (11/04/2022)   Alcohol Screen    Last Alcohol Screening Score (AUDIT): 5  Housing: Low Risk (04/18/2023)   Housing Stability Vital Sign    Unable to Pay for Housing in the Last Year: No    Number of Times  Moved in the Last Year: 0    Homeless in the Last Year: No  Utilities: Not At Risk (04/18/2023)   AHC Utilities    Threatened with loss of utilities: No  Health Literacy: Patient Declined (02/10/2023)   B1300 Health Literacy    Frequency of need for help with medical instructions: Patient declines to respond     Constitutional: Patient reports fatigue.  Denies fever, malaise, headache or abrupt weight changes.  HEENT: Denies eye pain, eye redness, ear pain, ringing in the ears, wax buildup, runny nose, nasal congestion, bloody nose, or sore throat. Respiratory: Patient reports intermittent shortness of breath.  Denies difficulty breathing, cough or sputum production.   Cardiovascular: Denies chest pain, chest tightness, palpitations or swelling in the hands or feet.  Gastrointestinal: Pt reports nausea and constipation. Denies abdominal pain, bloating, diarrhea or blood in the stool.  GU: Patient reports urgency, frequency and nocturia.  Denies pain with urination, burning sensation, blood in urine, odor or discharge. Musculoskeletal: Patient reports chronic joint pain, decrease in range of motion of shoulders.  Denies difficulty with gait, muscle pain or joint swelling.  Skin: Denies redness, rashes, lesions or ulcercations.  Neurological: Patient reports insomnia.  Denies dizziness, difficulty with memory, difficulty with speech or problems with balance and coordination.  Psych: Patient has a history of depression.  Denies anxiety, SI/HI.  No other specific complaints in a complete review of systems (except as listed in HPI above).     Objective:   Physical Exam  BP (!) 148/84 (BP Location: Left Arm, Patient Position: Sitting, Cuff Size: Large)   Ht 6' (1.829 m)   Wt (!) 334 lb (151.5 kg)   BMI 45.30 kg/m    Wt Readings from Last 3 Encounters:  03/16/24 (!) 334 lb 3.2 oz (151.6 kg)  03/14/24 (!) 334 lb (151.5 kg)  02/01/24 (!) 330 lb 3.2 oz (149.8 kg)    General: Appears his  stated age, obese, in NAD. Skin: Warm, dry and intact. No ulcerations noted. HEENT: Head: normal shape and size; Eyes: sclera white, no icterus, conjunctiva pink, PERRLA and EOMs intact;  Cardiovascular: Normal rate and rhythm. S1,S2 noted.  No murmur, rubs or gallops noted. No JVD.  No BLE edema. No carotid bruits noted. Pulmonary/Chest: Normal effort and positive vesicular breath sounds. No respiratory distress. No wheezes, rales or ronchi noted.  Abdomen: Soft and nontender. Normal bowel sounds.  Musculoskeletal: Decreased internal/external rotation of the right shoulder however internal rotation is worse.  Decreased external rotation of the left shoulder, normal internal rotation.  Shoulder shrug equal.  Negative drop can test bilaterally.  Strength 5/5 BUE.  Handgrips equal.  No difficulty with gait.  Neurological: Alert and oriented.  Coordination normal.  Psychiatric: Mood and affect mildly flat. Behavior is normal. Judgment and thought content normal.    BMET    Component Value Date/Time   NA 136 03/14/2024 0935   NA 136 11/23/2023 1004   K 4.4 03/14/2024 0935   CL 99 03/14/2024 0935   CO2 28 03/14/2024 0935   GLUCOSE 164 (H) 03/14/2024 0935   BUN 14 03/14/2024 0935   BUN 13 11/23/2023 1004   CREATININE 1.24 03/14/2024 0935   CALCIUM  9.2 03/14/2024 0935   GFRNONAA >60 04/22/2023 0909   GFRNONAA 69 08/05/2020 0955   GFRAA 79 08/05/2020 0955    Lipid Panel     Component Value Date/Time   CHOL 179 03/14/2024 0935   TRIG 339 (H) 03/14/2024 0935   HDL 35 (L) 03/14/2024 0935   CHOLHDL 5.1 (H) 03/14/2024 0935   VLDL 28.0 01/23/2020 1413   LDLCALC 99 03/14/2024 0935    CBC    Component Value Date/Time   WBC 4.6 03/14/2024 0935   RBC 5.78 03/14/2024 0935   HGB 15.0 03/14/2024 0935   HGB 15.0 11/30/2022 0951   HCT 47.4 03/14/2024 0935   HCT 47.7 11/30/2022 0951   PLT 228 03/14/2024 0935   PLT 217 11/30/2022 0951   MCV 82.0 03/14/2024 0935   MCV 84 11/30/2022 0951    MCH 26.0 (L) 03/14/2024 0935   MCHC 31.6 03/14/2024 0935   RDW 14.6 03/14/2024 0935   RDW 14.7 11/30/2022 0951   LYMPHSABS 1.3 04/15/2021 1024   EOSABS 0.1 04/15/2021 1024   BASOSABS 0.1 04/15/2021 1024    Hgb A1C Lab Results  Component Value Date   HGBA1C 6.3 (H) 03/14/2024            Assessment & Plan:   Assessment and Plan    Chronic shoulder pain with rotator cuff tear Chronic shoulder pain due to rotator cuff tear. Previous prednisone  injection provided temporary relief. MRI showed tendon inflammation and tears. Prednisone  not suitable long-term due to side effects. Surgery considered if conservative measures fail, contingent on A1c <7. - Discuss with cardiology about starting low-dose prednisone  (5 mg daily) if approved. - Discontinue Celebrex  and use Tylenol  if prednisone  is started. - Consider repeating MRIs of both shoulders if prednisone  is ineffective. - Continue home exercises as advised by PA.  Type 2 diabetes mellitus Managed with Mounjaro  and Trulicity . Insurance issues causing medication lapse. Prednisone  may elevate blood sugar, requiring monitoring. Mounjaro  and Trulicity  effective but cause nausea and constipation. - Contact to resolve insurance issues for Mounjaro . - Monitor blood sugar levels closely if prednisone  is  started. - Consider dietary modifications to manage blood sugar levels, such as using sugar-free syrup.  Constipation Severe constipation likely due to tramadol  and diabetes medications. Previous stool softeners and laxatives insufficient. - Discontinued tramadol . - Started Miralax 17 grams daily in the morning. - Started Colace up to 300 mg at bedtime. - Provided nausea medication if needed.  Hyperlipidemia Management complicated by statin intolerance due to joint pain. Repatha  considered as an alternative. - Discuss Repatha  with cardiologist and consider starting if approved.        RTC in 6 months for your annual exam Angeline Laura, NP  "

## 2024-03-28 NOTE — Telephone Encounter (Signed)
 Do we know the status of getting the Januvia  and Mounjaro  approved?

## 2024-03-28 NOTE — Patient Instructions (Signed)

## 2024-03-28 NOTE — Patient Instructions (Signed)
 Medication Instructions:  Your physician recommends that you continue on your current medications as directed. Please refer to the Current Medication list given to you today.  *If you need a refill on your cardiac medications before your next appointment, please call your pharmacy*  Lab Work: No labs ordered today    Testing/Procedures: No test ordered today   Follow-Up: At [facility], you and your health needs are our priority.  As part of our continuing mission to provide you with exceptional heart care, our providers are all part of one team.  This team includes your primary Cardiologist (physician) and Advanced Practice Providers or APPs (Physician Assistants and Nurse Practitioners) who all work together to provide you with the care you need, when you need it.  Your next appointment:   3 to 4 month(s)  Provider:   Suzann Riddle, NP

## 2024-03-29 ENCOUNTER — Telehealth: Payer: Self-pay

## 2024-03-29 ENCOUNTER — Other Ambulatory Visit: Payer: Self-pay | Admitting: Internal Medicine

## 2024-03-29 ENCOUNTER — Other Ambulatory Visit (HOSPITAL_COMMUNITY): Payer: Self-pay

## 2024-03-29 MED ORDER — PREDNISONE 5 MG PO TABS: 5.0000 mg | ORAL_TABLET | Freq: Every day | ORAL | 1 refills | Status: AC

## 2024-03-29 MED ORDER — REPATHA SURECLICK 140 MG/ML ~~LOC~~ SOAJ: 140.0000 mg | SUBCUTANEOUS | 1 refills | Status: DC

## 2024-03-29 NOTE — Telephone Encounter (Signed)
 Pharmacy Patient Advocate Encounter   Received notification from [employer] [facility] that prior authorization for Repatha  [employer] 140 is required/requested.   Insurance verification completed.   The patient is insured through [employer].   Per test claim: Per test claim, medication is not covered due to plan/benefit exclusion, PA not submitted at this time Patient plan prefers Praluent.

## 2024-03-29 NOTE — Progress Notes (Signed)
 Please notify patient or wife that I have sent in the prednisone  5 mg daily and ordered the Repatha  injections every 2 weeks.  The Repatha  injections will [place] need a prior Auth.  I am also reaching out to the prior Auth team to check the status of the Januvia  and Mounjaro .  He should stop tramadol  and Celebrex  at this time.

## 2024-03-29 NOTE — Telephone Encounter (Signed)
 Wife notified below per [facility]. Verbalized understanding.   Please notify patient or wife that I have sent in the prednisone  5 mg daily and ordered the Repatha  injections every 2 weeks.  The Repatha  injections will [place] need a prior Auth.  I am also reaching out to the prior Auth team to check the status of the Januvia  and Mounjaro .  He should stop tramadol  and Celebrex  at this time.

## 2024-03-29 NOTE — Telephone Encounter (Signed)
 Pharmacy Patient Advocate Encounter  Received notification from [facility] that Prior Authorization for Mounjaro  5 has been DENIED.  Full denial letter will be uploaded to the media tab. See denial reason below.    PA #/Case ID/Reference #: # [id number]

## 2024-03-30 ENCOUNTER — Telehealth: Payer: Self-pay

## 2024-03-30 ENCOUNTER — Other Ambulatory Visit (HOSPITAL_COMMUNITY): Payer: Self-pay

## 2024-03-30 NOTE — Telephone Encounter (Signed)
 Pharmacy Patient Advocate Encounter   Received notification from [facility] [employer]s [facility] that prior authorization for Mounjaro  5 is required/requested.   Insurance verification completed.   The patient is insured through [employer].   Per test claim: PA required; PA submitted to above mentioned insurance via Latent Key/confirmation #/EOC [id number] Status is pending

## 2024-03-30 NOTE — Telephone Encounter (Signed)
 I have discontinued the DPP 4 inhibitor sitagliptin .  Please proceed with the PA.

## 2024-04-02 ENCOUNTER — Other Ambulatory Visit: Payer: Self-pay | Admitting: Internal Medicine

## 2024-04-02 ENCOUNTER — Other Ambulatory Visit: Payer: Self-pay | Admitting: Cardiology

## 2024-04-02 ENCOUNTER — Other Ambulatory Visit (HOSPITAL_COMMUNITY): Payer: Self-pay

## 2024-04-02 NOTE — Telephone Encounter (Signed)
 Pharmacy Patient Advocate Encounter  Received notification from [facility] that Prior Authorization for Mounjaro  5 has been APPROVED from 04/02/24 to 04/02/25. Ran test claim, Copay is $30.00. This test claim was processed through [facility]- copay amounts may vary at other pharmacies due to pharmacy/plan contracts, or as the patient moves through the different stages of their insurance plan.   PA #/Case ID/Reference #: # [id number]

## 2024-04-04 NOTE — Telephone Encounter (Signed)
 In accordance with refill protocols, please review and address the following requirements before this medication refill can be authorized:  Vital signs

## 2024-04-05 ENCOUNTER — Other Ambulatory Visit: Payer: Self-pay | Admitting: Internal Medicine

## 2024-04-05 ENCOUNTER — Other Ambulatory Visit (HOSPITAL_COMMUNITY): Payer: Self-pay

## 2024-04-05 ENCOUNTER — Telehealth: Payer: Self-pay

## 2024-04-05 MED ORDER — PRALUENT 75 MG/ML ~~LOC~~ SOAJ: 75.0000 mg | SUBCUTANEOUS | 1 refills | Status: AC

## 2024-04-05 NOTE — Progress Notes (Signed)
 Anthony Castillo                                          MRN: [id number]   04/05/2024   The VBCI Quality Team Specialist reviewed this patient medical record for the purposes of chart review for care gap closure. The following were reviewed: chart review for care gap closure-controlling blood pressure.    VBCI Quality Team

## 2024-04-05 NOTE — Telephone Encounter (Signed)
 Pharmacy Patient Advocate Encounter  Received notification from [facility] that Prior Authorization for Mounjaro  5 has been APPROVED from 04/02/24 to 04/02/25   PA #/Case ID/Reference #: # [id number]   Unable to run test claim due to connection error to [facility] health.

## 2024-04-05 NOTE — Telephone Encounter (Signed)
 Any update on this PA?

## 2024-04-05 NOTE — Telephone Encounter (Signed)
 Pharmacy Patient Advocate Encounter   Received notification from [facility] KEY that prior authorization for Praluent  75 is required/requested.   Insurance verification completed.   The patient is insured through [employer].   Per test claim: PA required; PA submitted to above mentioned insurance via Latent Key/confirmation #/EOC [facility] Status is pending

## 2024-06-26 ENCOUNTER — Ambulatory Visit: Admitting: Cardiology

## 2024-08-15 ENCOUNTER — Encounter: Payer: Self-pay | Admitting: Internal Medicine
